# Patient Record
Sex: Female | Born: 1935 | Race: White | Hispanic: No | Smoking: Never smoker
Health system: Southern US, Community
[De-identification: ages and names within clinical notes are randomized; demographics above are authoritative.]

## PROBLEM LIST (undated history)

## (undated) DIAGNOSIS — E559 Vitamin D deficiency, unspecified: Secondary | ICD-10-CM

## (undated) DIAGNOSIS — E039 Hypothyroidism, unspecified: Secondary | ICD-10-CM

## (undated) DIAGNOSIS — F321 Major depressive disorder, single episode, moderate: Secondary | ICD-10-CM

## (undated) DIAGNOSIS — K754 Autoimmune hepatitis: Secondary | ICD-10-CM

## (undated) DIAGNOSIS — M810 Age-related osteoporosis without current pathological fracture: Secondary | ICD-10-CM

## (undated) DIAGNOSIS — J309 Allergic rhinitis, unspecified: Secondary | ICD-10-CM

## (undated) DIAGNOSIS — F411 Generalized anxiety disorder: Secondary | ICD-10-CM

## (undated) DIAGNOSIS — I10 Essential (primary) hypertension: Secondary | ICD-10-CM

## (undated) DIAGNOSIS — R51 Headache: Secondary | ICD-10-CM

## (undated) HISTORY — DX: Vitamin D deficiency, unspecified: E55.9

## (undated) HISTORY — DX: Allergic rhinitis, unspecified: J30.9

## (undated) HISTORY — DX: Essential (primary) hypertension: I10

## (undated) HISTORY — DX: Autoimmune hepatitis: K75.4

## (undated) HISTORY — DX: Headache: R51

## (undated) HISTORY — DX: Major depressive disorder, single episode, moderate: F32.1

## (undated) HISTORY — DX: Hypothyroidism, unspecified: E03.9

## (undated) HISTORY — DX: Age-related osteoporosis without current pathological fracture: M81.0

## (undated) HISTORY — DX: Generalized anxiety disorder: F41.1

---
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
## 2005-02-03 LAB — HM COLONOSCOPY

## 2006-02-03 HISTORY — PX: CATARACT EXTRACTION: SUR2

## 2006-02-03 LAB — CONVERTED CEMR LAB: Pap Smear: NORMAL

## 2007-02-02 ENCOUNTER — Encounter: Payer: Self-pay | Admitting: Family Medicine

## 2007-02-04 HISTORY — PX: CATARACT EXTRACTION: SUR2

## 2008-10-16 ENCOUNTER — Encounter: Payer: Self-pay | Admitting: Family Medicine

## 2009-02-12 ENCOUNTER — Encounter: Payer: Self-pay | Admitting: Family Medicine

## 2009-03-08 ENCOUNTER — Encounter: Payer: Self-pay | Admitting: Family Medicine

## 2009-04-26 ENCOUNTER — Ambulatory Visit: Payer: Self-pay | Admitting: Family Medicine

## 2009-04-26 DIAGNOSIS — E559 Vitamin D deficiency, unspecified: Secondary | ICD-10-CM | POA: Insufficient documentation

## 2009-04-26 DIAGNOSIS — J309 Allergic rhinitis, unspecified: Secondary | ICD-10-CM | POA: Insufficient documentation

## 2009-04-26 DIAGNOSIS — I10 Essential (primary) hypertension: Secondary | ICD-10-CM

## 2009-04-26 DIAGNOSIS — E039 Hypothyroidism, unspecified: Secondary | ICD-10-CM

## 2009-04-26 DIAGNOSIS — M810 Age-related osteoporosis without current pathological fracture: Secondary | ICD-10-CM | POA: Insufficient documentation

## 2009-05-03 ENCOUNTER — Encounter: Admission: RE | Admit: 2009-05-03 | Discharge: 2009-05-03 | Payer: Self-pay | Admitting: Family Medicine

## 2009-05-17 ENCOUNTER — Ambulatory Visit: Payer: Self-pay | Admitting: Family Medicine

## 2009-05-24 ENCOUNTER — Encounter: Payer: Self-pay | Admitting: Family Medicine

## 2009-05-25 ENCOUNTER — Encounter: Payer: Self-pay | Admitting: Family Medicine

## 2009-06-12 ENCOUNTER — Ambulatory Visit: Payer: Self-pay | Admitting: Family Medicine

## 2009-06-14 LAB — CONVERTED CEMR LAB
Albumin: 4 g/dL (ref 3.5–5.2)
BUN: 16 mg/dL (ref 6–23)
CO2: 29 meq/L (ref 19–32)
Calcium: 9.1 mg/dL (ref 8.4–10.5)
Chloride: 95 meq/L — ABNORMAL LOW (ref 96–112)
Creatinine, Ser: 0.7 mg/dL (ref 0.4–1.2)
GFR calc non Af Amer: 84.96 mL/min (ref >60)
Glucose, Bld: 92 mg/dL (ref 70–99)
Phosphorus: 3.7 mg/dL (ref 2.3–4.6)
Potassium: 4.4 meq/L (ref 3.5–5.1)
Sodium: 132 meq/L — ABNORMAL LOW (ref 135–145)

## 2009-07-09 ENCOUNTER — Telehealth: Payer: Self-pay | Admitting: Family Medicine

## 2009-07-11 ENCOUNTER — Telehealth: Payer: Self-pay | Admitting: Family Medicine

## 2009-09-04 ENCOUNTER — Encounter: Payer: Self-pay | Admitting: Family Medicine

## 2009-09-05 ENCOUNTER — Encounter: Payer: Self-pay | Admitting: Family Medicine

## 2009-10-02 ENCOUNTER — Encounter: Payer: Self-pay | Admitting: Family Medicine

## 2009-10-15 ENCOUNTER — Encounter: Payer: Self-pay | Admitting: Family Medicine

## 2009-10-19 ENCOUNTER — Ambulatory Visit: Payer: Self-pay | Admitting: Family Medicine

## 2009-10-19 DIAGNOSIS — F321 Major depressive disorder, single episode, moderate: Secondary | ICD-10-CM

## 2009-10-19 DIAGNOSIS — T887XXA Unspecified adverse effect of drug or medicament, initial encounter: Secondary | ICD-10-CM | POA: Insufficient documentation

## 2009-10-22 LAB — CONVERTED CEMR LAB
BUN: 14 mg/dL (ref 6–23)
CO2: 27 meq/L (ref 19–32)
Calcium: 9 mg/dL (ref 8.4–10.5)
Chloride: 97 meq/L (ref 96–112)
Creatinine, Ser: 0.73 mg/dL (ref 0.40–1.20)
Glucose, Bld: 90 mg/dL (ref 70–99)
Potassium: 4.2 meq/L (ref 3.5–5.3)
Sodium: 133 meq/L — ABNORMAL LOW (ref 135–145)

## 2009-10-23 ENCOUNTER — Telehealth: Payer: Self-pay | Admitting: Family Medicine

## 2009-10-25 ENCOUNTER — Telehealth: Payer: Self-pay | Admitting: Family Medicine

## 2009-11-06 ENCOUNTER — Telehealth: Payer: Self-pay | Admitting: Family Medicine

## 2009-11-10 ENCOUNTER — Encounter: Payer: Self-pay | Admitting: Family Medicine

## 2009-12-11 ENCOUNTER — Ambulatory Visit: Payer: Self-pay | Admitting: Family Medicine

## 2009-12-11 DIAGNOSIS — F411 Generalized anxiety disorder: Secondary | ICD-10-CM | POA: Insufficient documentation

## 2009-12-18 ENCOUNTER — Encounter: Payer: Self-pay | Admitting: Family Medicine

## 2009-12-19 ENCOUNTER — Encounter: Payer: Self-pay | Admitting: Family Medicine

## 2010-02-01 ENCOUNTER — Ambulatory Visit: Payer: Self-pay | Admitting: Internal Medicine

## 2010-02-02 DIAGNOSIS — R259 Unspecified abnormal involuntary movements: Secondary | ICD-10-CM | POA: Insufficient documentation

## 2010-02-15 ENCOUNTER — Ambulatory Visit: Admit: 2010-02-15 | Payer: Self-pay | Admitting: Licensed Clinical Social Worker

## 2010-02-15 ENCOUNTER — Ambulatory Visit
Admission: RE | Admit: 2010-02-15 | Discharge: 2010-02-15 | Payer: Self-pay | Source: Home / Self Care | Attending: Internal Medicine | Admitting: Internal Medicine

## 2010-02-15 DIAGNOSIS — R51 Headache: Secondary | ICD-10-CM

## 2010-02-15 DIAGNOSIS — R519 Headache, unspecified: Secondary | ICD-10-CM | POA: Insufficient documentation

## 2010-02-15 HISTORY — DX: Headache: R51

## 2010-02-18 ENCOUNTER — Ambulatory Visit
Admission: RE | Admit: 2010-02-18 | Discharge: 2010-02-18 | Payer: Self-pay | Source: Home / Self Care | Attending: Licensed Clinical Social Worker | Admitting: Licensed Clinical Social Worker

## 2010-02-20 ENCOUNTER — Telehealth: Payer: Self-pay | Admitting: Internal Medicine

## 2010-02-21 ENCOUNTER — Other Ambulatory Visit: Payer: Self-pay | Admitting: Internal Medicine

## 2010-02-21 ENCOUNTER — Ambulatory Visit
Admission: RE | Admit: 2010-02-21 | Discharge: 2010-02-21 | Payer: Self-pay | Source: Home / Self Care | Attending: Internal Medicine | Admitting: Internal Medicine

## 2010-02-21 LAB — BUN: BUN: 13 mg/dL (ref 6–23)

## 2010-02-21 LAB — CREATININE, SERUM: Creatinine, Ser: 0.6 mg/dL (ref 0.4–1.2)

## 2010-02-22 ENCOUNTER — Ambulatory Visit (HOSPITAL_COMMUNITY)
Admission: RE | Admit: 2010-02-22 | Discharge: 2010-02-22 | Payer: Self-pay | Source: Home / Self Care | Attending: Internal Medicine | Admitting: Internal Medicine

## 2010-02-25 ENCOUNTER — Ambulatory Visit
Admission: RE | Admit: 2010-02-25 | Discharge: 2010-02-25 | Payer: Self-pay | Source: Home / Self Care | Attending: Licensed Clinical Social Worker | Admitting: Licensed Clinical Social Worker

## 2010-03-05 NOTE — Letter (Signed)
Summary: Patient Questionnaire  Patient Questionnaire   Imported By: Beau Fanny 04/27/2009 09:00:07  _____________________________________________________________________  External Attachment:    Type:   Image     Comment:   External Document

## 2010-03-05 NOTE — Progress Notes (Signed)
Summary: wants phone call   Phone Note Call from Patient Call back at [phone]   Caller: Daughter- Okey Regal  Call For: Judith Part MD Summary of Call: Daughter is requesting that you call her at your convenience. She says that it is regarding her mother's phsychological  condition, that it is all she would tell me.  Initial call taken by: Melody Comas,  November 06, 2009 1:22 PM  Follow-up for Phone Call        would you leave her a message that it may not be until end of the day that I get a chance to call her back - - and send this back to me  Follow-up by: Judith Part MD,  November 06, 2009 2:40 PM  Additional Follow-up for Phone Call Additional follow up Details #1::        [place] notified as instructed by telephone. Okey Regal said that would be fine.Lewanda Rife LPN  November 06, 2009 2:51 PM    Additional Follow-up for Phone Call Additional follow up Details #2::    I spoke to Okey Regal -- who I have permission to talk with (her daughter) Dr Donell Beers took Pattricia Boss off zoloft for rash and that is better thankfully she is on ativan for sleep/ anxiety- which is helping still very depressed however  Okey Regal left message with Dr Caprice Renshaw office on sept 26th and again yesterday and has not heard back with a plan this concerns her greatly  she is also worried about liver health (with her hx of auto immune hepatitis ) and is interested in B12 and folate levels with hx of fatigue and limited/ vegetarian diet   please print this phone note and fax to Dr Caprice Renshaw office (attn to his nurse) please schedule labs for hepatic panel and also B12 and folate for 995.2 adn 780.79 and call Okey Regal about that tomorrow -- thanks  Follow-up by: Judith Part MD,  November 06, 2009 4:41 PM  Additional Follow-up for Phone Call Additional follow up Details #3:: Details for Additional Follow-up Action Taken: Spoke with pt's daughter Okey Regal. Pt does not want to have B12 and folate done now and pt already has appt  in Nov.2011 with digestive specialist to have liver panel done. For now pt does not want to schedule any labs. If there is a change Okey Regal will let Dr Milinda Antis know. Copy of this note faxed to the attention of Dr. Caprice Renshaw nurse [phone] as instructed.Lewanda Rife LPN  November 07, 2009 11:31 AM

## 2010-03-05 NOTE — Consult Note (Signed)
Summary: [facility]  [facility]   Imported By: Lanelle Bal 05/30/2009 10:23:50  _____________________________________________________________________  External Attachment:    Type:   Image     Comment:   External Document

## 2010-03-05 NOTE — Letter (Signed)
Summary: Letter to Patient Regarding Labs/[facility]  Letter to Patient Regarding Labs/[facility]   Imported By: Lanelle Bal 06/05/2009 12:24:35  _____________________________________________________________________  External Attachment:    Type:   Image     Comment:   External Document

## 2010-03-05 NOTE — Assessment & Plan Note (Signed)
Summary: 3-4 WEEK FOLLOW UP BLOOK PRESSURE CHECK/[facility]   Vital Signs:  Patient profile:   77 year old female Height:      66 inches Weight:      115.25 pounds BMI:     18.67 Temp:     98.4 degrees F oral Pulse rate:   60 / minute Pulse rhythm:   regular BP sitting:   138 / 66  (left arm) Cuff size:   regular  Vitals Entered By: Lewanda Rife LPN (Jun 12, 2009 12:46 PM)  Serial Vital Signs/Assessments:  Time      Position  BP       Pulse  Resp  Temp     By                     130/70                         Judith Part MD  CC: 3-4 week follow up on BP   History of Present Illness: here for f/u of HTN -- bp is 138/66- much imp now on ace and hctz 25    so far no side effects or problems   used a different kind of cream last night -- and now arms are red -- brand Barbara Cower new package -- thinks she is allergic to it    not outdoors a lot  has rosacea and sensitive skin     Allergies: 1)  ! Amlodipine Besylate (Amlodipine Besylate) 2)  ! Penicillin 3)  ! Sulfa 4)  ! Azathioprine (Azathioprine)  Past History:  Past Medical History: Last updated: 05-23-09 Autoimmune hepatitis 2008 Jaundice 2008 hypothyroid  HTN  OP  allergic rhinitis  bunions       Dr Rosine Beat --GI in [place]  Past Surgical History: Last updated: 05-23-09 Cataract extraction left eye 2008 Cataract extraction right eye 2009 Tonsillectomy 1945 colonosc 2007 normal -- made her sick and does not ever want another one   Family History: Last updated: 05-23-2009 Father: deceased heart disease Mother: deceased stroke and high BP Siblings: sister age 85 stroke deceased  Social History: Last updated: 23-May-2009 Retired Married Never Smoked Alcohol use-no Drug use-no daughter is Vung Kush- Fifield   Risk Factors: Smoking Status: never (05/23/2009)  Review of Systems General:  Denies fatigue, loss of appetite, and malaise. Eyes:  Denies blurring and eye irritation. CV:  Denies chest  pain or discomfort, fatigue, lightheadness, palpitations, shortness of breath with exertion, and swelling of feet. Resp:  Denies cough and wheezing. GI:  Denies abdominal pain and change in bowel habits. GU:  Denies abnormal vaginal bleeding. MS:  Denies joint redness, joint swelling, and muscle aches. Derm:  Denies itching, lesion(s), poor wound healing, and rash. Neuro:  Denies headaches, numbness, and tingling. Endo:  Denies cold intolerance and heat intolerance. Heme:  Denies abnormal bruising and bleeding.  Physical Exam  General:  Well-developed,well-nourished,in no acute distress; alert,appropriate and cooperative throughout examination Head:  normocephalic, atraumatic, and no abnormalities observed.   Eyes:  vision grossly intact, pupils equal, pupils round, and pupils reactive to light.   Mouth:  pharynx pink and moist.   Neck:  supple with full rom and no masses or thyromegally, no JVD or carotid bruit  Lungs:  Normal respiratory effort, chest expands symmetrically. Lungs are clear to auscultation, no crackles or wheezes. Heart:  Normal rate and regular rhythm. S1 and S2 normal without gallop, murmur, click, rub or  other extra sounds. Extremities:  No clubbing, cyanosis, edema, or deformity noted with normal full range of motion of all joints.   Neurologic:  gait normal and DTRs symmetrical and normal.  no tremor  Skin:  mild redness- hands to wrists only  this is where she used new moisturizer recently Cervical Nodes:  No lymphadenopathy noted Psych:  nl affect today   Impression & Recommendations:  Problem # 1:  HYPERTENSION, BENIGN ESSENTIAL (ICD-401.1) Assessment Improved  bp is much imp with current ace and hct  will continue these and good lifestyle habits lab today  update if side eff f/u in 6 mo  Her updated medication list for this problem includes:    Lisinopril 40 Mg Tabs (Lisinopril) .[name] Kitchen... 1 by mouth once daily    Hydrochlorothiazide 25 Mg Tabs  (Hydrochlorothiazide) .[name] Kitchen... 1 by mouth once daily  Orders: Venipuncture (16109) TLB-Renal Function Panel (80069-RENAL)  BP today: 138/66-- re check 130/70 Prior BP: 160/80 (05/17/2009)  Complete Medication List: 1)  Levothroid 50 Mcg Tabs (Levothyroxine sodium) .... Take one tablet daily 2)  Lisinopril 40 Mg Tabs (Lisinopril) .[name] Kitchen.. 1 by mouth once daily 3)  Mercaptopurine 50 Mg Tabs (Mercaptopurine) .... Take 1/2 tablet daily 4)  Alendronate Sodium 70 Mg Tabs (Alendronate sodium) .... Take one capsule once a week 5)  Calcium 500 Mg Tabs (Calcium) .... Take three tablets by mouth daily 6)  Hydrochlorothiazide 25 Mg Tabs (Hydrochlorothiazide) .[name] Kitchen.. 1 by mouth once daily 7)  Vitamin D 2000 Unit Tabs (Cholecalciferol) .... Take 1 tablet by mouth once a day  Patient Instructions: 1)  labs today 2)  continue current medicines  3)  your blood pressure is much better today 4)  keep taking good care of herself  5)  follow up with me in about 6 months   Current Allergies (reviewed today): ! AMLODIPINE BESYLATE (AMLODIPINE BESYLATE) ! PENICILLIN ! SULFA ! AZATHIOPRINE (AZATHIOPRINE)

## 2010-03-05 NOTE — Assessment & Plan Note (Signed)
Summary: ROA FOR 6 MONTH FOLLOW-UP/JRR   Vital Signs:  Patient profile:   77 year old female Height:      66 inches Weight:      111.50 pounds BMI:     18.06 Temp:     98 degrees F oral Pulse rate:   60 / minute Pulse rhythm:   regular BP sitting:   158 / 68  (left arm) Cuff size:   regular  Vitals Entered By: Lewanda Rife LPN (December 11, 2009 10:39 AM)  Serial Vital Signs/Assessments:  Time      Position  BP       Pulse  Resp  Temp     By                     142/70                         Judith Part MD  Comments: at rest By: Judith Part MD   CC: six month f/u   History of Present Illness: here for f/u of hypothyroidism and HTN and OP   is on thyroid supplement   bp high on first check today 158/68   given autoimmune hepatitis and fatigue- was interested in B12 /folate check a while back  seeing psychiatry-- Dr Donell Beers  on mirtazapine/ lorazapam/ temazapam for dep and anx and insomnia  is sleeping better now  her daughter is not very happy with their care- sent px to the wrong pharmacy  px were not faxed -- and had to go over there  wants to work on new psychiatrist ref   her L shoulder is hurting -- sore ? from position in bed  a few weeks -- now is hurting up higher   had episode on one saturday -- got dizzy and felt weird  went to [facility] -- her systolic was 199 / ?  went to urgent care (saw Dr Merla Riches) -- he px another bp med  added atenolol and is doing ok on that   are going to check it again   on fosamax for OP - no problems with that   wt is stable  Allergies: 1)  ! Amlodipine Besylate (Amlodipine Besylate) 2)  ! Penicillin 3)  ! Sulfa 4)  ! Azathioprine (Azathioprine) 5)  ! * Hctz 6)  ! Zoloft  Past History:  Past Medical History: Last updated: 10/19/2009 Autoimmune hepatitis 2008 Jaundice 2008 hypothyroid  HTN  OP  allergic rhinitis  bunions  depression/ anxiety major      Dr Rosine Beat --GI in [place]  Past Surgical  History: Last updated: 05/11/2009 Cataract extraction left eye 2008 Cataract extraction right eye 2009 Tonsillectomy 1945 colonosc 2007 normal -- made her sick and does not ever want another one   Family History: Last updated: 05-11-09 Father: deceased heart disease Mother: deceased stroke and high BP Siblings: sister age 55 stroke deceased  Social History: Last updated: May 11, 2009 Retired Married Never Smoked Alcohol use-no Drug use-no daughter is Jonette Wassel- Fifield   Risk Factors: Smoking Status: never (May 11, 2009)  Review of Systems General:  Complains of fatigue and loss of appetite; denies chills and fever. Eyes:  Denies blurring and eye irritation. CV:  Denies chest pain or discomfort, lightheadness, palpitations, and shortness of breath with exertion. Resp:  Denies cough and shortness of breath. GI:  Denies change in bowel habits, nausea, and vomiting. GU:  Denies urinary frequency. MS:  Complains of stiffness; denies cramps and muscle weakness. Derm:  Denies itching, lesion(s), poor wound healing, and rash. Neuro:  Denies numbness, tingling, and weakness. Psych:  Complains of anxiety, depression, and irritability; denies easily tearful, panic attacks, sense of great danger, and suicidal thoughts/plans. Endo:  Denies cold intolerance, excessive thirst, excessive urination, and heat intolerance. Heme:  Denies abnormal bruising and bleeding.  Physical Exam  General:  slim fatigued appearing elderly female Head:  normocephalic, atraumatic, and no abnormalities observed.   Eyes:  vision grossly intact, pupils equal, pupils round, and pupils reactive to light.  no conjunctival pallor, injection or icterus  Mouth:  pharynx pink and moist.   Neck:  supple with full rom and no masses or thyromegally, no JVD or carotid bruit  Chest Wall:  No deformities, masses, or tenderness noted. Lungs:  Normal respiratory effort, chest expands symmetrically. Lungs are clear to  auscultation, no crackles or wheezes. Heart:  Normal rate and regular rhythm. S1 and S2 normal without gallop, murmur, click, rub or other extra sounds. Abdomen:  soft, non-tender, and normal bowel sounds.   Msk:  No deformity or scoliosis noted of thoracic or lumbar spine.  no acute joint changes  kyphosis noted  some tenderness of L pericervical musculature  worse with full rot of head  no bony tenderness Pulses:  R and L carotid,radial,femoral,dorsalis pedis and posterior tibial pulses are full and equal bilaterally Extremities:  No clubbing, cyanosis, edema, or deformity noted with normal full range of motion of all joints.   Neurologic:  sensation intact to light touch, gait normal, and DTRs symmetrical and normal.  no tremor  Skin:  Intact without suspicious lesions or rashes Cervical Nodes:  No lymphadenopathy noted Psych:  depressed affect complains of physical symptoms, treatment at psychiatrist and long journey to get to this office her daughter is helpful with obt history   Impression & Recommendations:  Problem # 1:  DEPRESSION, MAJOR, MODERATE (ICD-296.22) Assessment Improved overall making some slow imp on mirtazapine  pt interested in different psych practice due to disorganization at current one  disc ref to new practice recommend continuing plan from Dr Donell Beers at this time until new practice is found  Orders: Psychiatric Referral (Psych)  Problem # 2:  HYPOTHYROIDISM (ICD-244.9) Assessment: Unchanged will send for UC labs to see how tsh was and then adv no clinical change except fatigue and depression Her updated medication list for this problem includes:    Levothroid 50 Mcg Tabs (Levothyroxine sodium) .[name] Kitchen... Take one tablet daily  Problem # 3:  HYPERTENSION, BENIGN ESSENTIAL (ICD-401.1) Assessment: Improved  bp is better on 2nd check today will continue current meds with atenolol and monitor at home  recommended omron cuff for home to keep eye on it  will  rev labs from urgent care  f/u 6 mo Her updated medication list for this problem includes:    Lisinopril 40 Mg Tabs (Lisinopril) .[name] Kitchen... 1 by mouth once daily    Atenolol 50 Mg Tabs (Atenolol) .[name] Kitchen... Take 1 tablet by mouth once a day  BP today: 158/68 Prior BP: 164/82 (10/19/2009)  Labs Reviewed: K+: 4.2 (10/19/2009) Creat: : 0.73 (10/19/2009)     Orders: Prescription Created Electronically [phone])  Problem # 4:  UNSPECIFIED VITAMIN D DEFICIENCY (ICD-268.9) Assessment: Unchanged this should be checked if not done at her uc visit  will review and then adv  is taking her vit D  Problem # 5:  * HEPATITIS/JAUNDICE Assessment: Comment Only hx of autoimmune hepatitis --  daughter is interested in checking her B12 and folate  diet not optimal lately will make recomm after rev reent labs at urgent care   Problem # 6:  NECK PAIN (ICD-723.1) Assessment: New soreness in L cervical muscuature - may be due to sleep position recommend trial of cervical support pillow / heat and update  Complete Medication List: 1)  Levothroid 50 Mcg Tabs (Levothyroxine sodium) .... Take one tablet daily 2)  Lisinopril 40 Mg Tabs (Lisinopril) .[name] Kitchen.. 1 by mouth once daily 3)  Mercaptopurine 50 Mg Tabs (Mercaptopurine) .... Take 1/2 tablet daily 4)  Alendronate Sodium 70 Mg Tabs (Alendronate sodium) .... Take one capsule once a week 5)  Calcium 500 Mg Tabs (Calcium) .... Take three tablets by mouth daily 6)  Vitamin D 2000 Unit Tabs (Cholecalciferol) .... Take 1 tablet by mouth once a day 7)  Temazepam 15 Mg Caps (Temazepam) .[name] Kitchen.. 1 by mouth at bedtime may repeat as needed 8)  Lorazepam 0.5 Mg Tabs (Lorazepam) .... Take 1 tablet by mouth once a day as needed. 9)  Mirtazapine 45 Mg Tabs (Mirtazapine) .... Take 1 tablet by mouth once a day at bedtime 10)  Atenolol 50 Mg Tabs (Atenolol) .... Take 1 tablet by mouth once a day 11)  Omron Blood Pressure Cuff  .... To check bp once daily and as needed for htn  Patient  Instructions: 1)  please send for labs from urgent care Dr Merla Riches  2)  after I review these I will call to let you know what labs we need to do to catch up  3)  try a foam cervical support pillow to keep your neck in better alignment 4)  I recommend OMRON bp cuff size regular for arm  5)  check blood presure at home when relaxed 6)  we will do a new psychiatry referral at check out  7)  follow up with me in about 6 months  Prescriptions: OMRON BLOOD PRESSURE CUFF to check bp once daily and as needed for HTN  #1 x 0   Entered and Authorized by:   Judith Part MD   Signed by:   Judith Part MD on 12/11/2009   Method used:   Print then Give to Patient   RxID:   [id number] ATENOLOL 50 MG TABS (ATENOLOL) Take 1 tablet by mouth once a day  #30 x 11   Entered and Authorized by:   Judith Part MD   Signed by:   Judith Part MD on 12/11/2009   Method used:   Electronically to        [facility] # [phone]* (retail)       [address]       [place], [place]  [address]       Ph: [phone]       Fax: [phone]   RxID:   [id number]    Orders Added: 1)  Psychiatric Referral [Psych] 2)  Prescription Created Electronically [G8553] 3)  Est. Patient Level IV [56433]    Current Allergies (reviewed today): ! AMLODIPINE BESYLATE (AMLODIPINE BESYLATE) ! PENICILLIN ! SULFA ! AZATHIOPRINE (AZATHIOPRINE) ! * HCTZ ! ZOLOFT

## 2010-03-05 NOTE — Assessment & Plan Note (Signed)
Summary: PT IS DEPRESSED   Vital Signs:  Patient profile:   77 year old female Height:      66 inches Weight:      112.25 pounds BMI:     18.18 Temp:     98.2 degrees F oral Pulse rate:   60 / minute Pulse rhythm:   regular BP sitting:   164 / 82  (left arm) Cuff size:   regular  Vitals Entered By: Lewanda Rife LPN (October 19, 2009 2:18 PM) CC: Pt is depressed   History of Present Illness: has been diagnosed with depression   on august 30th - saw Thora Lance - clinical psychologist  he sent them Dr Merla Riches on the same day -- walk in clinic primary care  did some blood tests for thyroid - was ok  given px for 25 mg of zoloft and a sleeping aid also (? name of med)   went for 2 weeks and no better or worse   sept 9th  went with daughter Calrol to Dr Pilar Jarvis again  by the 12 th was feeling very anx and not imp at all /frankly worse  early am went to Dr Theotis Barrio - he sent her to [facility] -- Samson Frederic (? director)  said she was in the wrong place and sent to [facility]-- saw Dr Donell Beers changed dosage to 50 mg of zoloft for 6 days and then it will go up to 100 mg  also changed sleep aid to reservatol   will see him again on nov 3rd   she is feeling worse and not  feels like - she does not have any feeling anymore  can't and does not do anything anymore -- just terrible -- cannot motivate herself  cannot even cry  feels sleepy sometimes -- may be from lack of sleep  sleep med helped at first -- and now back to every few hours   has a hx of depression in the family-- and she has a lot of anxiety about that  anx may be making bp go up  mother committed suicide as older person , and sister has manic depression    Allergies: 1)  ! Amlodipine Besylate (Amlodipine Besylate) 2)  ! Penicillin 3)  ! Sulfa 4)  ! Azathioprine (Azathioprine) 5)  ! * Hctz  Past History:  Past Surgical History: Last updated: May 07, 2009 Cataract extraction left eye  2008 Cataract extraction right eye 2009 Tonsillectomy 1945 colonosc 2007 normal -- made her sick and does not ever want another one   Family History: Last updated: 2009/05/07 Father: deceased heart disease Mother: deceased stroke and high BP Siblings: sister age 7 stroke deceased  Social History: Last updated: 2009/05/07 Retired Married Never Smoked Alcohol use-no Drug use-no daughter is Yasamin Karel- Fifield   Risk Factors: Smoking Status: never (05-07-09)  Past Medical History: Autoimmune hepatitis 2008 Jaundice 2008 hypothyroid  HTN  OP  allergic rhinitis  bunions  depression/ anxiety major      Dr Rosine Beat --GI in [place]  Review of Systems General:  Complains of fatigue, loss of appetite, sleep disorder, and weight loss; denies weakness. Eyes:  Denies blurring and eye irritation. CV:  Denies chest pain or discomfort, lightheadness, and palpitations. Resp:  Denies cough, shortness of breath, and wheezing. GI:  Complains of constipation; denies abdominal pain. GU:  Denies urinary frequency. MS:  Denies muscle aches and cramps. Derm:  Denies itching, lesion(s), poor wound healing, and rash. Neuro:  Denies headaches, numbness,  and tingling. Psych:  Complains of panic attacks; denies sense of great danger and suicidal thoughts/plans. Endo:  Denies cold intolerance, excessive thirst, excessive urination, and heat intolerance. Heme:  Denies abnormal bruising and bleeding.  Physical Exam  General:  slim fatigued appearing elderly female Head:  normocephalic, atraumatic, and no abnormalities observed.   Eyes:  vision grossly intact, pupils equal, pupils round, and pupils reactive to light.  no conjunctival pallor, injection or icterus  Neck:  supple with full rom and no masses or thyromegally, no JVD or carotid bruit  Lungs:  Normal respiratory effort, chest expands symmetrically. Lungs are clear to auscultation, no crackles or wheezes. Heart:  Normal rate and regular  rhythm. S1 and S2 normal without gallop, murmur, click, rub or other extra sounds. Msk:  No deformity or scoliosis noted of thoracic or lumbar spine.  no acute joint changes  Pulses:  R and L carotid,radial,femoral,dorsalis pedis and posterior tibial pulses are full and equal bilaterally Extremities:  No clubbing, cyanosis, edema, or deformity noted with normal full range of motion of all joints.   Neurologic:  sensation intact to light touch, gait normal, and DTRs symmetrical and normal.  no tremor  Skin:  Intact without suspicious lesions or rashes Cervical Nodes:  No lymphadenopathy noted Psych:  candidly disc symptoms poor eye contact - seems very sad and down   Impression & Recommendations:  Problem # 1:  DEPRESSION, MAJOR, MODERATE (ICD-296.22) Assessment New without suicidal ideation spent 25 minutes face to face time with pt , over 50% of which was spent on counseling and coordination of care   see inst for recommendations disc stressors / symtpoms/ support sources/ plan for care/ opt for care and poss side eff  check sodium level in light of ssri (has been low in the past) adv to continue f/u with psychiatrist /counselor and check in today  Complete Medication List: 1)  Levothroid 50 Mcg Tabs (Levothyroxine sodium) .... Take one tablet daily 2)  Lisinopril 40 Mg Tabs (Lisinopril) .[name] Kitchen.. 1 by mouth once daily 3)  Mercaptopurine 50 Mg Tabs (Mercaptopurine) .... Take 1/2 tablet daily 4)  Alendronate Sodium 70 Mg Tabs (Alendronate sodium) .... Take one capsule once a week 5)  Calcium 500 Mg Tabs (Calcium) .... Take three tablets by mouth daily 6)  Vitamin D 2000 Unit Tabs (Cholecalciferol) .... Take 1 tablet by mouth once a day 7)  Sertraline Hcl 100 Mg Tabs (Sertraline hcl) .... Take 1/2 tablet by mouth every morning for 6 days and then one tablet by mout every morning. 8)  Temazepam 15 Mg Caps (Temazepam) .[name] Kitchen.. 1-2 by mouth at bedtime as needed  Other Orders: Venipuncture  (16109) T-Basic Metabolic Panel (60454-09811) Flu Vaccine 35yrs + MEDICARE PATIENTS (B1478) Administration Flu vaccine - MCR (G0008) Specimen Handling (29562)  Patient Instructions: 1)  please try to keep up with regular meals and fluids  2)  if any suicidal thoughts or worries - call your psychiatrist or go to [facility] for urgent evaluation  3)  continue zoloft 4)  you can take 2 of the temazepam at bedtime 5)  keep monitoring and helping with activities of daily living  6)  call Dr [facility] office or monday- to update  7)  we will check sodium level today  Current Allergies (reviewed today): ! AMLODIPINE BESYLATE (AMLODIPINE BESYLATE) ! PENICILLIN ! SULFA ! AZATHIOPRINE (AZATHIOPRINE) ! * HCTZ      Flu Vaccine Consent Questions     Do you have a history  of severe allergic reactions to this vaccine? no    Any prior history of allergic reactions to egg and/or gelatin? no    Do you have a sensitivity to the preservative Thimersol? no    Do you have a past history of Guillan-Barre Syndrome? no    Do you currently have an acute febrile illness? no    Have you ever had a severe reaction to latex? no    Vaccine information given and explained to patient? yes    Are you currently pregnant? no    Lot Number:[id number]   Exp Date:08/03/2010   Site Given  Left Deltoid IMflu Lewanda Rife LPN  October 19, 2009 3:35 PM   Appended Document: PT IS DEPRESSED please send copy of this note to her psychiatrist Dr Donell Beers  Appended Document: PT IS DEPRESSED Copy of this note faxed thru EMR to Dr Donell Beers as instructed.

## 2010-03-05 NOTE — Progress Notes (Signed)
Summary: itchy rash  Phone Note Call from Patient Call back at Home Phone [phone]   Caller: Patient Call For: Judith Part MD Summary of Call: Pt states she has itchy rash on arms and neck since the 2nd week of may.  She thinks this is from the hctz that she was started on in april.  Should she stop this, should she be seen? You dont have any appts available today and she only wants to see you. Initial call taken by: Lowella Petties CMA,  July 09, 2009 8:24 AM  Follow-up for Phone Call        stop the hct  I am out this afternoon to wed call on wed am please to update me with how rash is -- and use caution in the sun  then will decide what to do next Follow-up by: Judith Part MD,  July 09, 2009 8:25 AM  Additional Follow-up for Phone Call Additional follow up Details #1::        Pt called, she wanted to know if Dr. Milinda Antis had returned her back...., I gave the intructions per Dr. Milinda Antis.[name] KitchenMarland KitchenNolan Lasser says she will follow on Wed. w/ Dr. Milinda Antis.Daine Gip  July 09, 2009 11:09 AM  Additional Follow-up by: Daine Gip,  July 09, 2009 11:10 AM

## 2010-03-05 NOTE — Progress Notes (Signed)
Summary: update on rash and itching  Phone Note Call from Patient Call back at Home Phone [phone]   Caller: Patient Call For: Judith Part MD Summary of Call: Pt was told to call back today with report on how her rash is doing since she stopped the hctz.  Pt said she has not been itching since last night.  Skin has leathery look like it has been peeling. Arms from wrist to elbows and neck  are peeling. Pt said rash looks like it is gone. Has not taken med since 07/09/09.Please advise.   Initial call taken by: Lewanda Rife LPN,  July 11, 7844 4:08 PM  Follow-up for Phone Call        stay off the hctz and let me know when arms look back to normal and no longer peeling or leathery -- I do not want to start any new meds until she is totally back to normal   Follow-up by: Judith Part MD,  July 11, 2009 5:22 PM  Additional Follow-up for Phone Call Additional follow up Details #1::        Patient notified as instructed by telephone. Lewanda Rife LPN  July 12, 9627 5:32 PM    New Allergies: ! * HCTZ New Allergies: ! * HCTZ

## 2010-03-05 NOTE — Letter (Signed)
Summary: Lab Test Results [facility]   Lab Test Results Letter/[facility]   Imported By: Maryln Gottron 12/28/2009 09:00:13  _____________________________________________________________________  External Attachment:    Type:   Image     Comment:   External Document

## 2010-03-05 NOTE — Letter (Signed)
Summary: Letter to Patient Regarding Labs/[facility]  Letter to Patient Regarding Labs/[facility]   Imported By: Lanelle Bal 05/01/2009 13:53:41  _____________________________________________________________________  External Attachment:    Type:   Image     Comment:   External Document

## 2010-03-05 NOTE — Letter (Signed)
Summary: [facility]  [facility]   Imported By: Maryln Gottron 11/21/2009 15:12:22  _____________________________________________________________________  External Attachment:    Type:   Image     Comment:   External Document

## 2010-03-05 NOTE — Assessment & Plan Note (Signed)
Summary: NEW PT TO EST PER DR Tabitha Tupper/[facility]   Vital Signs:  Patient profile:   77 year old female Height:      66 inches Weight:      114.25 pounds BMI:     18.51 Temp:     98.2 degrees F oral Pulse rate:   76 / minute Pulse rhythm:   regular BP sitting:   186 / 80  (right arm) Cuff size:   regular  Vitals Entered By: Lewanda Rife LPN (05/02/2009 11:01 AM) CC: new patient to establish   History of Present Illness: is here to get est for care   Dr Merian Capron was her prev physician   bp is high  tried amlodipine and she was all to it  is on lisinopril for a long time   has a hx of autoimmune hepatitis -- has check up next mo liver labs were nl in the fall  is off prednisone now   she does not get tetnus shots - declines them   had pneumonia shot -- and then she had some shoulder pain 2 weeks later   has osteoporosis -- had to be on prednisone in past for the auto immune hepatitis  had bone density - last one was recent -- and it had worsened some  had been on fosamax and calcium  has not followed up for that yet  takes 800 iuvit D -- an level is 27  is taking 1500 mg daily of calcium   used to walk for exercise  is on her feet all the time  does a lot of stretches   occ has some very slt vaginal spotting    last labs 9/10     Preventive Screening-Counseling & Management  Alcohol-Tobacco     Smoking Status: never      Drug Use:  no.    Allergies (verified): 1)  ! Amlodipine Besylate (Amlodipine Besylate) 2)  ! Penicillin 3)  ! Sulfa 4)  ! Azathioprine (Azathioprine)  Past History:  Family History: Last updated: 02-May-2009 Father: deceased heart disease Mother: deceased stroke and high BP Siblings: sister age 71 stroke deceased  Social History: Last updated: 05/02/2009 Retired Married Never Smoked Alcohol use-no Drug use-no daughter is Kenzleigh Sedam- Fifield   Risk Factors: Smoking Status: never (2009/05/02)  Past Medical  History: Autoimmune hepatitis 2008 Jaundice 2008 hypothyroid  HTN  OP  allergic rhinitis  bunions       Dr Rosine Beat --GI in [place]  Past Surgical History: Cataract extraction left eye 2008 Cataract extraction right eye 2009 Tonsillectomy 1945 colonosc 2007 normal -- made her sick and does not ever want another one   Family History: Father: deceased heart disease Mother: deceased stroke and high BP Siblings: sister age 45 stroke deceased  Social History: Retired Married Never Smoked Alcohol use-no Drug use-no daughter is Mavery Milling- Fifield  Smoking Status:  never Drug Use:  no  Review of Systems General:  Denies fatigue, fever, loss of appetite, and malaise. Eyes:  Denies blurring and eye pain. CV:  Denies chest pain or discomfort, lightheadness, palpitations, and shortness of breath with exertion. Resp:  Denies cough, shortness of breath, and wheezing. GI:  Denies abdominal pain, bloody stools, change in bowel habits, and indigestion. GU:  Denies dysuria and urinary frequency. MS:  Denies joint pain, joint redness, and joint swelling. Derm:  Denies itching, lesion(s), poor wound healing, and rash. Neuro:  Denies numbness, tingling, and weakness. Psych:  Denies anxiety and depression.  Endo:  Denies cold intolerance, excessive thirst, excessive urination, and heat intolerance. Heme:  Denies abnormal bruising and bleeding.  Physical Exam  General:  slim and well app elderly femaile Head:  normocephalic, atraumatic, and no abnormalities observed.   Eyes:  vision grossly intact, pupils equal, pupils round, and pupils reactive to light.  no conjunctival pallor, injection or icterus  Ears:  R ear normal and L ear normal.   Nose:  no nasal discharge.   Mouth:  pharynx pink and moist.   Neck:  supple with full rom and no masses or thyromegally, no JVD or carotid bruit  Chest Wall:  No deformities, masses, or tenderness noted. Lungs:  Normal respiratory effort, chest  expands symmetrically. Lungs are clear to auscultation, no crackles or wheezes. Heart:  Normal rate and regular rhythm. S1 and S2 normal without gallop, murmur, click, rub or other extra sounds. Abdomen:  Bowel sounds positive,abdomen soft and non-tender without masses, organomegaly or hernias noted. no renal bruits  Msk:  No deformity or scoliosis noted of thoracic or lumbar spine.  no acute joint changes  Pulses:  R and L carotid,radial,femoral,dorsalis pedis and posterior tibial pulses are full and equal bilaterally Extremities:  No clubbing, cyanosis, edema, or deformity noted with normal full range of motion of all joints.   Neurologic:  sensation intact to light touch, gait normal, and DTRs symmetrical and normal.  no tremor  Skin:  Intact without suspicious lesions or rashes fair with lentigos diffusely Cervical Nodes:  No lymphadenopathy noted Inguinal Nodes:  No significant adenopathy Psych:  nl affect is somewhat serious and stoic    Impression & Recommendations:  Problem # 1:  POSTMENOPAUSAL BLEEDING (ICD-627.1) Assessment New this is scant - but still abn get pelvic US and then make plan- may need endo bx  Orders: Radiology Referral (Radiology)  Problem # 2:  HYPERTENSION, BENIGN ESSENTIAL (ICD-401.1) Assessment: New bp high and not well conrolled - though no symptoms overall good lifestyle habits  inc lisinopril to 40-- update if side eff then follow up  Her updated medication list for this problem includes:    Lisinopril 40 Mg Tabs (Lisinopril) .[name] Kitchen... 1 by mouth once daily  Problem # 3:  HYPOTHYROIDISM (ICD-244.9) Assessment: New overall stable clinically and stable theraputic tsh from the fall  continue same dose  Her updated medication list for this problem includes:    Levothroid 50 Mcg Tabs (Levothyroxine sodium) .[name] Kitchen... Take one tablet daily  Problem # 4:  UNSPECIFIED VITAMIN D DEFICIENCY (ICD-268.9) Assessment: New vit D level is low and disc imp of this to  bone and overall health  adv to inc vit D to 2000 international units per day also disc ca intake  Problem # 5:  OSTEOPOROSIS (ICD-733.00) Assessment: New sounds like this may have worsened on last dexa  poss due to the steriods she took temporarliy - off now also disc other risk factors- age/ post men and also hypothyroid  rev ca and D rec continue fosamax rev dexa at next visit  if this continues to worsen - disc pos of forteo in future also disc safety issure  Her updated medication list for this problem includes:    Vitamin D 400 Unit Tabs (Cholecalciferol) .[name] Kitchen... Take two daily    Alendronate Sodium 70 Mg Tabs (Alendronate sodium) .[name] Kitchen... Take one capsule once a week  Complete Medication List: 1)  Vitamin D 400 Unit Tabs (Cholecalciferol) .... Take two daily 2)  Levothroid 50 Mcg Tabs (Levothyroxine sodium) .[name] KitchenMarland KitchenMarland Kitchen  Take one tablet daily 3)  Lisinopril 40 Mg Tabs (Lisinopril) .[name] Kitchen.. 1 by mouth once daily 4)  Mercaptopurine 50 Mg Tabs (Mercaptopurine) .... Take 1/2 tablet daily 5)  Alendronate Sodium 70 Mg Tabs (Alendronate sodium) .... Take one capsule once a week  Patient Instructions: 1)  please send for copy of last dexa report - at [facility]  2)  increase your lisinopril to 40 mg once daily -- if you have any side effects - let me know  3)  keep up walking and stay active  4)  avoid a lot of salt in diet  5)  increase your vitamin D to 2000 international units daily  6)  continue other medicines without change  7)  follow up in 2-4 weeks to re check blood pressure  8)  we will set up pelvic ultrasound at check out Prescriptions: LISINOPRIL 40 MG TABS (LISINOPRIL) 1 by mouth once daily  #30 x 11   Entered and Authorized by:   Judith Part MD   Signed by:   Judith Part MD on 04/26/2009   Method used:   Electronically to        [facility] # [phone]* (retail)       [address]       [place], [place]  [address]       Ph: [phone]       Fax: [phone]    RxID:   [id number] LEVOTHROID 50 MCG TABS (LEVOTHYROXINE SODIUM) take one tablet daily  #30 x 11   Entered and Authorized by:   Judith Part MD   Signed by:   Judith Part MD on 04/26/2009   Method used:   Electronically to        [facility] [address] # [phone]* (retail)       [address]       [place], [place]  [address]       Ph: [phone]       Fax: [phone]   RxID:   [id number] ALENDRONATE SODIUM 70 MG TABS (ALENDRONATE SODIUM) take one capsule once a week  #4 x 11   Entered and Authorized by:   Judith Part MD   Signed by:   Judith Part MD on 04/26/2009   Method used:   Electronically to        [facility] [address] # [phone]* (retail)       [address]       [place], [place]  [address]       Ph: [phone]       Fax: [phone]   RxID:   [id number]   Current Allergies (reviewed today): ! AMLODIPINE BESYLATE (AMLODIPINE BESYLATE) ! PENICILLIN ! SULFA ! AZATHIOPRINE (AZATHIOPRINE)   Immunization History:  Pneumovax Immunization History:    Pneumovax:  pneumovax (02/04/2008)  Influenza Immunization History:    Influenza:  fluvax 3+ (11/03/2008)    Preventive Care Screening  Mammogram:    Date:  02/12/2009    Results:  normal   Last Pneumovax:    Date:  02/04/2008    Results:  Pneumovax   Pap Smear:    Date:  02/03/2006    Results:  normal   Colonoscopy:    Date:  02/03/2005    Results:  normal   Contraindications of Treatment or Deferment of Test/Procedure:    Test/Procedure: TD vaccine    Reason for deferment: declined     got really sick with colonosc -- declines any other  ones

## 2010-03-05 NOTE — Letter (Signed)
Summary: [facility]  [facility]   Imported By: Maryln Gottron 11/21/2009 15:11:14  _____________________________________________________________________  External Attachment:    Type:   Image     Comment:   External Document

## 2010-03-05 NOTE — Progress Notes (Signed)
  Phone Note Other Incoming   Summary of Call: pt was here with her husband for a visit and mentioned that she had rash under breasts/ abd and between legs - a little itchy  Follow-up for Phone Call        rash was red - patchy with some satellite lesions- I suspect yeast at this time and recommended lotrimin otc  I mentioned if rash spreads to let me and her psychiatrist know becasue that could mean drug reaction Follow-up by: Judith Part MD,  October 23, 2009 2:14 PM

## 2010-03-05 NOTE — Consult Note (Signed)
Summary: [facility]  [facility]   Imported By: Lanelle Bal 12/26/2009 14:54:11  _____________________________________________________________________  External Attachment:    Type:   Image     Comment:   External Document

## 2010-03-05 NOTE — Consult Note (Signed)
Summary: [facility]  [facility]   Imported By: Lanelle Bal 09/11/2009 11:18:46  _____________________________________________________________________  External Attachment:    Type:   Image     Comment:   External Document

## 2010-03-05 NOTE — Progress Notes (Signed)
Summary: pt has stopped zoloft  Phone Note Call from Patient   Caller: Patient Call For: Judith Part MD Summary of Call: Pt called to let you know that her psychiatrist took her off of the zoloft because of the rash she has. Initial call taken by: Lowella Petties CMA,  October 25, 2009 11:42 AM  Follow-up for Phone Call        thanks for the update -- let me know if he switched her to something else and what it is so I can adj her med list  Follow-up by: Judith Part MD,  October 25, 2009 12:46 PM  Additional Follow-up for Phone Call Additional follow up Details #1::        Left message for patient to call back. Lewanda Rife LPN  October 25, 2009 3:32 PM   Spoke with pt, she said her psych told her he couldnt change her to anything else until her rash clears up, and it is not.  He did add lorazepam .5 mg, to take one during the day and one at bedtime with temazepam.  She wants to be on something for depression, she sounds very down. Additional Follow-up by: Lowella Petties CMA,  October 26, 2009 10:13 AM   New Allergies: ! ZOLOFT Additional Follow-up for Phone Call Additional follow up Details #2::    she really has to follow the direction of her psychiatrist -- if he cannot safely start her on anything yet - she will have to wait  it is important for her to let him know the minute her rash starts to improve so he can take action if she feels worse or suicidal - needs to let Dr Donell Beers know / or if after hours go to [place] [facility] to be seen by behavioral health please copy this phone note and fax to Dr Plovsky's office - thanks Follow-up by: Judith Part MD,  October 26, 2009 10:53 AM  Additional Follow-up for Phone Call Additional follow up Details #3:: Details for Additional Follow-up Action Taken: Patient notified as instructed by telephone. Pt said the rash does not look any better and it may be spreading more. Pt said she would call back if rash got worse.copy of  note faxed to Dr Donell Beers as instructed.Lewanda Rife LPN  October 26, 2009 12:48 PM   New Allergies: ! ZOLOFT

## 2010-03-05 NOTE — Progress Notes (Signed)
  Phone Note Outgoing Call   Summary of Call: after obtaining verbal consent from the patient at her last visit - I was able to contact her daughter today to disc her depression.    Follow-up for Phone Call        according to Okey Regal -- Ms Croghan has not in fact been a very happy person throughout her life -- very controlling and with very strong OCD tendancies  (I was not suprised by this given her somewhat extreme reaction when I diagnosed her with HTN this year).. is obvious that loosing control of life or body is devastating to her  this depression may not be as new as we thought  no hx of abuse known  despite this depression- house and hygiene are still immaculate , and no suicidal thoughts known Okey Regal says she can be manipulative at times as well  I stressed imp of contact with Dr Donell Beers about her progress and I will relay this info to him as well  Follow-up by: Judith Part MD,  October 23, 2009 5:07 PM     Appended Document:  please send copy of this phone note to Dr Donell Beers her psychiatrist   Appended Document:  Copy of note faxed thru [facility] to Dr Donell Beers.

## 2010-03-05 NOTE — Assessment & Plan Note (Signed)
Summary: ROA F/U ON B/P  CYD   Vital Signs:  Patient profile:   77 year old female Height:      66 inches Weight:      113 pounds BMI:     18.30 Temp:     98.1 degrees F oral Pulse rate:   68 / minute Pulse rhythm:   regular BP sitting:   160 / 80  (left arm) Cuff size:   regular  Vitals Entered By: Lewanda Rife LPN (May 17, 2009 11:34 AM) CC: recheck BP, Back Pain   History of Present Illness: here for f/u of HTN   bp today is down from prev at 160/80-- though better than last time  did inc lisinopril to 40 mg daily- no side eff or problems  does eat low salt diet and get good exercise   also inc vit D to 2000 international units daily   dexa - OP  rev this from 1/11 has T score for spine that is quite low -4 disc risks of fx from this  she is very concerned about pot side eff of fosamax - though is having no side effects  I would like to re chekc this at a year (given hx of steroid use) -- but she says her insurance absolutely will not pay for this study more than every 2 years     Allergies: 1)  ! Amlodipine Besylate (Amlodipine Besylate) 2)  ! Penicillin 3)  ! Sulfa 4)  ! Azathioprine (Azathioprine)  Past History:  Past Medical History: Last updated: 17-May-2009 Autoimmune hepatitis 2008 Jaundice 2008 hypothyroid  HTN  OP  allergic rhinitis  bunions       Dr Rosine Beat --GI in [place]  Past Surgical History: Last updated: 17-May-2009 Cataract extraction left eye 2008 Cataract extraction right eye 2009 Tonsillectomy 1945 colonosc 2007 normal -- made her sick and does not ever want another one   Family History: Last updated: 05/17/09 Father: deceased heart disease Mother: deceased stroke and high BP Siblings: sister age 67 stroke deceased  Social History: Last updated: May 17, 2009 Retired Married Never Smoked Alcohol use-no Drug use-no daughter is Annalaya Wile- Fifield   Risk Factors: Smoking Status: never (05/17/2009)  Review of  Systems General:  Denies fatigue, loss of appetite, and malaise. Eyes:  Denies blurring and eye irritation. CV:  Denies chest pain or discomfort, lightheadness, near fainting, and palpitations. Resp:  Denies cough, shortness of breath, and wheezing. GI:  Denies abdominal pain, change in bowel habits, indigestion, and nausea. MS:  Complains of joint pain; denies joint redness, joint swelling, cramps, and muscle weakness; occasionally gets some fleeting joint pain- no more than that . Neuro:  Denies numbness and tingling. Endo:  Denies cold intolerance, excessive thirst, excessive urination, and heat intolerance.  Physical Exam  General:  slim and well app elderly femaile Head:  normocephalic, atraumatic, and no abnormalities observed.   Eyes:  vision grossly intact, pupils equal, pupils round, and pupils reactive to light.  no conjunctival pallor, injection or icterus  Mouth:  pharynx pink and moist.   Neck:  supple with full rom and no masses or thyromegally, no JVD or carotid bruit  Lungs:  Normal respiratory effort, chest expands symmetrically. Lungs are clear to auscultation, no crackles or wheezes. Heart:  Normal rate and regular rhythm. S1 and S2 normal without gallop, murmur, click, rub or other extra sounds. Msk:  No deformity or scoliosis noted of thoracic or lumbar spine.  no acute joint changes  Pulses:  R and L carotid,radial,femoral,dorsalis pedis and posterior tibial pulses are full and equal bilaterally Extremities:  No clubbing, cyanosis, edema, or deformity noted with normal full range of motion of all joints.   Neurologic:  gait normal and DTRs symmetrical and normal.  no tremor  Skin:  Intact without suspicious lesions or rashes Cervical Nodes:  No lymphadenopathy noted Psych:  pt is very dissapointed in her current med probs and options for treatment - we discussed this in much detail     Impression & Recommendations:  Problem # 1:  OSTEOPOROSIS  (ICD-733.00) Assessment Unchanged  rev last dexa with LS T score of -4- this is worrisome  disc nature of it and risk of fx pt continues fosamax -- disc poss side eff she is worried about I would like to re check dexa 1 year but pt tells me ins will not pay but every 2  enc 2000 international units vit D daily based on last labs  Her updated medication list for this problem includes:    Vitamin D 400 Unit Tabs (Cholecalciferol) .[name] Kitchen... Take two daily    Alendronate Sodium 70 Mg Tabs (Alendronate sodium) .[name] Kitchen... Take one capsule once a week    Calcium 500 Mg Tabs (Calcium) .[name] Kitchen... Take three tablets by mouth daily  Bone Density: osteoporosis (02/12/2009) Bone Density-Hip: -3.4 (02/02/2007) Bone Density-LS: -4.0 (02/12/2009)  Orders: Prescription Created Electronically [phone])  Problem # 2:  HYPERTENSION, BENIGN ESSENTIAL (ICD-401.1) Assessment: Improved  HTN is imp with inc lisinopril but not resolved  disc nature of HTN and why we treat it  lifestyle is already optimal  will add hctz and update if side eff or problems- disc what to exp f/u 3-4 wk for bp check and labs   Her updated medication list for this problem includes:    Lisinopril 40 Mg Tabs (Lisinopril) .[name] Kitchen... 1 by mouth once daily    Hydrochlorothiazide 25 Mg Tabs (Hydrochlorothiazide) .[name] Kitchen... 1 by mouth once daily  BP today: 160/80 Prior BP: 186/80 (04/26/2009)  Orders: Prescription Created Electronically [phone])  Complete Medication List: 1)  Vitamin D 400 Unit Tabs (Cholecalciferol) .... Take two daily 2)  Levothroid 50 Mcg Tabs (Levothyroxine sodium) .... Take one tablet daily 3)  Lisinopril 40 Mg Tabs (Lisinopril) .[name] Kitchen.. 1 by mouth once daily 4)  Mercaptopurine 50 Mg Tabs (Mercaptopurine) .... Take 1/2 tablet daily 5)  Alendronate Sodium 70 Mg Tabs (Alendronate sodium) .... Take one capsule once a week 6)  Calcium 500 Mg Tabs (Calcium) .... Take three tablets by mouth daily 7)  Hydrochlorothiazide 25 Mg Tabs  (Hydrochlorothiazide) .[name] Kitchen.. 1 by mouth once daily  Patient Instructions: 1)  start the hctz (new blood pressure medicine ) one pill daily in the am  2)  this will make you urinate more often shortly after taking it 3)  no change in other medicines  4)  will continue to watch your bone density - continue calcium and vitamin D and your fosamax  5)  follow up with me in 3-4 weeks for blood pressure check - and we may do labs as well (but you do not need to fast)  Prescriptions: HYDROCHLOROTHIAZIDE 25 MG TABS (HYDROCHLOROTHIAZIDE) 1 by mouth once daily  #30 x 11   Entered and Authorized by:   Judith Part MD   Signed by:   Judith Part MD on 05/17/2009   Method used:   Electronically to        [facility] [id number]* (retail)  [address]       [place], [place]  [address]       Ph: [phone]       Fax: [phone]   RxID:   [id number]   Current Allergies (reviewed today): ! AMLODIPINE BESYLATE (AMLODIPINE BESYLATE) ! PENICILLIN ! SULFA ! AZATHIOPRINE (AZATHIOPRINE)

## 2010-03-05 NOTE — Letter (Signed)
Summary: Health Maintenance Checklist  Health Maintenance Checklist   Imported By: Lanelle Bal 05/01/2009 13:55:01  _____________________________________________________________________  External Attachment:    Type:   Image     Comment:   External Document

## 2010-03-05 NOTE — Letter (Signed)
Summary: [facility]  [facility]   Imported By: Maryln Gottron 11/21/2009 15:04:31  _____________________________________________________________________  External Attachment:    Type:   Image     Comment:   External Document

## 2010-03-05 NOTE — Letter (Signed)
Summary: Letter to Patient Regarding Lab Results/[facility]  Letter to Patient Regarding Lab Results/[facility]   Imported By: Lanelle Bal 09/11/2009 11:20:11  _____________________________________________________________________  External Attachment:    Type:   Image     Comment:   External Document

## 2010-03-07 NOTE — Progress Notes (Signed)
Summary: creatnine & BUN  ---- Converted from flag ---- ---- 02/19/2010 4:27 PM, Newt Lukes MD wrote:   ---- 02/19/2010 4:26 PM, Newt Lukes MD wrote: please place in idx and let pt/dtr know - code 401.9 - thanks  ---- 02/19/2010 3:39 PM, Shelbie Proctor wrote: pt need bun and creatine  done - appt for MRI  friday jan 20,2012@4 :00 pm -per [place] anna ------------------------------       Additional Follow-up for Phone Call Additional follow up Details #2::    Called pt no ansew LMOM RTC Follow-up by: Orlan Leavens RMA,  February 20, 2010 8:12 AM  Additional Follow-up for Phone Call Additional follow up Details #3:: Details for Additional Follow-up Action Taken: Daughter called back gave md response. Put order in IDX for bun &creatnine Additional Follow-up by: Orlan Leavens RMA,  February 20, 2010 10:05 AM

## 2010-03-07 NOTE — Assessment & Plan Note (Signed)
Summary: 2-3 WK FU  STC   Vital Signs:  Patient profile:   77 year old female Height:      66 inches (167.64 cm) Weight:      106.12 pounds (48.24 kg) O2 Sat:      95 % on Room air Temp:     98.8 degrees F (37.11 degrees C) oral Pulse rate:   74 / minute BP sitting:   120 / 72  (left arm) Cuff size:   regular  Vitals Entered By: Orlan Leavens RMA (February 15, 2010 10:41 AM)  O2 Flow:  Room air CC: 2-3 week follow-up Is Patient Diabetic? No Pain Assessment Patient in pain? no        Primary Care Provider:  Newt Lukes MD  CC:  2-3 week follow-up.  History of Present Illness: here for f/u tremors, improved/resolved with med changes (reduction in remeron dose)  c/o episodic headache  no balance problems but ? transient blurred vision - denies weakness dtr VERY concerned for CVA?  reviewed chronic med issues:  1) autoimmune hep - follows with GI specialist in [place] for same - remote hx pred use, now on MP6 - reports compliance with ongoing medical treatment and no changes in medication dose or frequency. denies adverse side effects related to current therapy.   2) hypothyroid - reports compliance with ongoing medical treatment and no changes in medication dose or frequency. denies adverse side effects related to current therapy.   3) HTN - uncontrolled - reports compliance with ongoing medical treatment and no changes in medication dose or frequency. denies adverse side effects related to current therapy. describes prior intol of amlodipine and hctz  4) depression/anxiety - severe - personal and FH same - follows with psyc but looking for new provider, pending pyschology appt for counseling - hx rash on zoloft, sleep better with ativan and restoril. current doses remeron reviewed as well as poor tol of wellbutrin trial  5) osteoporosis - reports compliance with ongoing medical treatment and no changes in medication dose or frequency. denies adverse side effects related  to current therapy.   Clinical Review Panels:  Complete Metabolic Panel   Glucose:  90 (10/19/2009)   Sodium:  133 (10/19/2009)   Potassium:  4.2 (10/19/2009)   Chloride:  97 (10/19/2009)   CO2:  27 (10/19/2009)   BUN:  14 (10/19/2009)   Creatinine:  0.73 (10/19/2009)   Albumin:  4.0 (06/12/2009)   Calcium:  9.0 (10/19/2009)   Current Medications (verified): 1)  Levothroid 50 Mcg Tabs (Levothyroxine Sodium) .... Take One Tablet Daily 2)  Lisinopril 40 Mg Tabs (Lisinopril) .[name] Kitchen.. 1 By Mouth Once Daily 3)  Mercaptopurine 50 Mg Tabs (Mercaptopurine) .... Take 1/2 Tablet Daily 4)  Alendronate Sodium 70 Mg Tabs (Alendronate Sodium) .... Take One Capsule Once A Week 5)  Calcium 500 Mg Tabs (Calcium) .... Take Three Tablets By Mouth Daily 6)  Vitamin D 2000 Unit Tabs (Cholecalciferol) .... Take 1 Tablet By Mouth Once A Day 7)  Temazepam 15 Mg Caps (Temazepam) .[name] Kitchen.. 1 By Mouth At Bedtime May Repeat As Needed 8)  Lorazepam 0.5 Mg Tabs (Lorazepam) .... Take 1 Tablet By Mouth Two Times A Day 9)  Mirtazapine 45 Mg Tabs (Mirtazapine) .... Take 1 Tablet By Mouth Once A Day At Bedtime 10)  Atenolol 50 Mg Tabs (Atenolol) .... Take 1 Tablet By Mouth Once A Day 11)  Omron Blood Pressure Cuff .... To Check Bp Once Daily and As Needed For Htn  12)  Align  Caps (Probiotic Product) .[name] Kitchen.. 1 By Mouth Once Daily  Allergies (verified): 1)  ! Amlodipine Besylate (Amlodipine Besylate) 2)  ! Penicillin 3)  ! Sulfa 4)  ! Azathioprine (Azathioprine) 5)  ! * Hctz 6)  ! Zoloft  Past History:  Past Medical History: Autoimmune hepatitis  dx 2008     Jaundice 2008 hypothyroid  HTN  allergic rhinitis  bunions  depression/ anxiety major - SEVERE osteoporosis   MD roster: GI - Dr Rosine Beat  in [place] psyc - [facility]  Family History: Father: deceased heart disease Mother: deceased stroke and high BP  Siblings: sister age 53 stroke deceased  Social History: Retired Married Never Smoked Alcohol use-no Drug  use-no daughter is Soo Steelman- Fifield responsible for majority of supervison for parents  Review of Systems  The patient denies fever, vision loss, hoarseness, chest pain, syncope, and peripheral edema.         transient dizzy spell 2-3 weeks ago, lasted <24h - no current vision change, HA or weakness. (see HPI above)  Physical Exam  General:  slim and fatigued appearing elderly female. dtr at side (dtr is very anxious) Eyes:  vision grossly intact, pupils equal, pupils round, and pupils reactive to light.  no conjunctival pallor, injection or icterus no nystagmus Ears:  normal pinnae bilaterally, without erythema, swelling, or tenderness to palpation. TMs clear, without effusion, or cerumen impaction. Hearing grossly normal bilaterally  Mouth:  pharynx pink and moist.   Lungs:  normal respiratory effort, no intercostal retractions or use of accessory muscles; normal breath sounds bilaterally - no crackles and no wheezes.    Heart:  normal rate, regular rhythm, no murmur, and no rub. BLE without edema.  Neurologic:  sensation intact to light touch, gait normal, and DTRs symmetrical and normal.  resolved intention and resting tremor of hands Psych:  very depressed affect and anxious perseveration. complains of treatment at psychiatrist with med confusion; also fear of being old and "all the bad things that happen when you are old"   Impression & Recommendations:  Problem # 1:  HEADACHE (ICD-784.0) episodic, also with transient dizziness - no imaging done since onset of major depression  MRI brain approp given these 2 events- ordered today rec ASA 81qd until further eval complete - neuro exam today benign - dtr offered reassurance Her updated medication list for this problem includes:    Atenolol 50 Mg Tabs (Atenolol) .[name] Kitchen... Take 1 tablet by mouth once a day    Aspirin Ec 81 Mg Tbec (Aspirin) .[name] Kitchen... 1 by mouth once daily  Orders: Radiology Referral (Radiology)  Problem # 2:   DEPRESSION, MAJOR, MODERATE (ICD-296.22)  Orders: Radiology Referral (Radiology) Psychiatric Referral (Psych)  Orders: Psychology Referral (Psychology)  overall making some slow imp on mirtazapine?, better with med dose reduction 01/2010 intol to wellbutrin and zoloft reviewed pt interested in different psych practice due to disorganization at current one  discussed ref to new practice recommend continuing plan from Dr Donell Beers at this time until new practice is found   also reminded [facility] or call [facility] if severe symptoms or si before est with new group also f/u behav health counseling for support pt and dtr describe understanding of same  Problem # 3:  TREMOR (ICD-781.0) Assessment: Improved  suspect was related to remeron, hx indicates pt taking 75mg  resolved symtpoms now on 45mg  at bedtime. pt reports improvement in tremor since stopping wellbutrin, to remain off same. recent labs reviewed,  exam otherwise benign reassurance provided  Complete Medication List: 1)  Levothroid 50 Mcg Tabs (Levothyroxine sodium) .... Take one tablet daily 2)  Lisinopril 40 Mg Tabs (Lisinopril) .[name] Kitchen.. 1 by mouth once daily 3)  Mercaptopurine 50 Mg Tabs (Mercaptopurine) .... Take 1/2 tablet daily 4)  Alendronate Sodium 70 Mg Tabs (Alendronate sodium) .... Take one capsule once a week 5)  Calcium 500 Mg Tabs (Calcium) .... Take three tablets by mouth daily 6)  Vitamin D 2000 Unit Tabs (Cholecalciferol) .... Take 1 tablet by mouth once a day 7)  Temazepam 15 Mg Caps (Temazepam) .[name] Kitchen.. 1 by mouth at bedtime as needed 8)  Lorazepam 0.5 Mg Tabs (Lorazepam) .... Take 1 tablet by mouth two times a day 9)  Mirtazapine 45 Mg Tabs (Mirtazapine) .... Take 1 tablet by mouth once a day at bedtime 10)  Atenolol 50 Mg Tabs (Atenolol) .... Take 1 tablet by mouth once a day 11)  Omron Blood Pressure Cuff  .... To check bp once daily and as needed for htn 12)  Align Caps (Probiotic product) .[name] Kitchen.. 1 by mouth once  daily 13)  Aspirin Ec 81 Mg Tbec (Aspirin) .[name] Kitchen.. 1 by mouth once daily  Patient Instructions: 1)  it was good to see you today. 2)  start aspirin 81mg  once daily and use temazepam only if needed for sleep - no other medication changes 3)  we'll make referral for MRI brain  due to headache and vision and depression symptoms. Our office will contact you regarding this appointment once made.  4)  will look into new psychiatrist for you - continue with dr. Donell Beers until new doctor established 5)  Please schedule a follow-up appointment in 3 months to review medications and check thyroid, call sooner if problems.    Orders Added: 1)  Radiology Referral [Radiology] 2)  Est. Patient Level IV [[address]] 3)  Psychiatric Referral [Psych]

## 2010-03-07 NOTE — Assessment & Plan Note (Signed)
Summary: New / Medicare / # /cd   Vital Signs:  Patient profile:   77 year old female Height:      66 inches Weight:      107.50 pounds BMI:     17.41 O2 Sat:      96 % on Room air Temp:     97.8 degrees F oral Pulse rate:   61 / minute BP sitting:   184 / 82  (left arm) Cuff size:   regular  Vitals Entered By: Margaret Pyle, CMA (February 01, 2010 2:15 PM)  O2 Flow:  Room air CC: Establish Care, shaking hands X 1 week   Primary Care Provider:  Newt Lukes MD  CC:  Establish Care and shaking hands X 1 week.  History of Present Illness: new to me but known to our group - recently followed at [facility] location - here to est care  c/o tremors, esp b hands. onset 4-5 days ago. correlates with recent inc in depression meds (remeron), denies weight loss, palp or fever. no headache or balance problems. slow improvement over past 2 days since stopping wellbutrin trial but not normal yet.  reviewed chronic med issues:  1) autoimmune hep - follows with GI specialist in [place] for same - remote hx pred use, now on MP6 - reports compliance with ongoing medical treatment and no changes in medication dose or frequency. denies adverse side effects related to current therapy.   2) hypothyroid - reports compliance with ongoing medical treatment and no changes in medication dose or frequency. denies adverse side effects related to current therapy. describes prior intol of amlodipine and hctz  3) HTN - uncontrolled - reports compliance with ongoing medical treatment and no changes in medication dose or frequency. denies adverse side effects related to current therapy.   4) depression/anxiety - severe - personal and FH same - follows with psyc but not pyschology for counseling - hx rash on zoloft, sleep better with ativan  and restoril. inc doses remeron reviewed when poor tol of wellbutrin  5) osteoporosis - reports compliance with ongoing medical treatment and no changes in medication  dose or frequency. denies adverse side effects related to current therapy.   Allergies: 1)  ! Amlodipine Besylate (Amlodipine Besylate) 2)  ! Penicillin 3)  ! Sulfa 4)  ! Azathioprine (Azathioprine) 5)  ! * Hctz 6)  ! Zoloft  Past History:  Past Medical History: Autoimmune hepatitis  dx 2008     Jaundice 2008 hypothyroid  HTN  allergic rhinitis  bunions  depression/ anxiety major osteoporosis  MD roster: GI - Dr Rosine Beat  in [facility] psyc - [facility]  Past Surgical History: Cataract extraction left eye 2008 Cataract extraction right eye 2009  Tonsillectomy 1945 colonosc 2007 normal -- made her sick and does not ever want another one   Family History: Reviewed history from 04/26/2009 and no changes required. Father: deceased heart disease Mother: deceased stroke and high BP Siblings: sister age 78 stroke deceased  Social History: Reviewed history from 04/26/2009 and no changes required. Retired Married Never Smoked Alcohol use-no Drug use-no daughter is [place] Sherr- [place]   Review of Systems       c/o chronic constipation, not helped with stool softners; otherwise, see HPI above. I have reviewed all other systems and they were negative.   Physical Exam  General:  slim fatigued appearing elderly female. dtr at side Eyes:  vision grossly intact, pupils equal, pupils round, and pupils reactive to light.  no conjunctival pallor, injection or icterus  Neck:  supple, full ROM, no masses, no thyromegaly; no thyroid nodules or tenderness. no JVD or carotid bruits.   Lungs:  normal respiratory effort, no intercostal retractions or use of accessory muscles; normal breath sounds bilaterally - no crackles and no wheezes.    Heart:  normal rate, regular rhythm, no murmur, and no rub. BLE without edema.  Msk:  No deformity or scoliosis noted of thoracic or lumbar spine.  no acute joint changes  kyphosis noted  Neurologic:  sensation intact to light touch, gait normal, and DTRs  symmetrical and normal.  mild intention and resting tremor of hands Skin:  Intact without suspicious lesions or rashes Psych:  very depressed affect and anxiety complains of treatment at psychiatrist with med confusion her daughter is helpful with obt history   Impression & Recommendations:  Problem # 1:  TREMOR (ICD-781.0) suspect related to remeron, hx indicates pt taking 75mg , reduce now to 45mg  at bedtime. pt reports some improvement in tremor since stopping wellbutrin 48h ago, to remain off same. recent labs reviewed, exam otherwise benign reassurance provided  Problem # 2:  DEPRESSION, MAJOR, MODERATE (ICD-296.22)  Orders: Psychology Referral (Psychology)  overall making some slow imp on mirtazapine , but dose confusion uncovered today intol to wellbutrin and zoloft reviewed pt interested in different psych practice due to disorganization at current one  disc ref to new practice recommend continuing plan from Dr Donell Beers at this time until new practice is found  provided name of different md, also instructed in need to fo to [facility] or call 911 if severe symptoms or si before est with new group also refer for behav health counseling for support pt and dtr describe understanding of same  Problem # 3:  ANXIETY (ICD-300.00)  Her updated medication list for this problem includes:    Lorazepam 0.5 Mg Tabs (Lorazepam) .[name] Kitchen... Take 1 tablet by mouth two times a day    Mirtazapine 45 Mg Tabs (Mirtazapine) .[name] Kitchen... Take 1 tablet by mouth once a day at bedtime  Problem # 4:  HYPERTENSION, BENIGN ESSENTIAL (ICD-401.1)  uncontrolled today, hesitant to make multiple med changes at one time due to pt sensitivity to meds and ?confusion psyc med adjust today, to watch bp at ome recheck here 2-3 weeks to next adjust bp meds, sooner if symptoms  into, to amlodipine and hctz reviewed Her updated medication list for this problem includes:    Lisinopril 40 Mg Tabs (Lisinopril) .[name] Kitchen... 1 by mouth  once daily    Atenolol 50 Mg Tabs (Atenolol) .[name] Kitchen... Take 1 tablet by mouth once a day  BP today: 184/82 Prior BP: 158/68 (12/11/2009)  Labs Reviewed: K+: 4.2 (10/19/2009) Creat: : 0.73 (10/19/2009)     Problem # 5:  HYPOTHYROIDISM (ICD-244.9) may need to check tsh though reports recentlydone. will review and send for prior records as needed  Her updated medication list for this problem includes:    Levothroid 50 Mcg Tabs (Levothyroxine sodium) .[name] Kitchen... Take one tablet daily  Problem # 6:  OSTEOPOROSIS (ICD-733.00)  Her updated medication list for this problem includes:    Alendronate Sodium 70 Mg Tabs (Alendronate sodium) .[name] Kitchen... Take one capsule once a week  rev last dexa 02/2009 with LS T score of -4- this is worrisome  disc nature of it and risk of fx pt continues fosamax -- disc poss side eff she is worried about increased toc 2000 international units vit D daily based on last labs  but may exac constipation, take as tol    Alendronate Sodium 70 Mg Tabs (Alendronate sodium) .[name] Kitchen... Take one capsule once a week    Calcium 500 Mg Tabs (Calcium) .[name] Kitchen... Take three tablets by mouth daily  Bone Density: osteoporosis (02/12/2009) Bone Density-Hip: -3.4 (02/02/2007) Bone Density-LS: -4.0 (02/12/2009)  Discussed medication use, applications of heat or ice, and exercises.   Time spent with patient/dtr 42 minutes, more than 50% of this time was spent counseling patient on depression and problems concerning the titration and trail of various medications, need for new psyc and counseling support, med adjustment and plans for close f/u  Complete Medication List: 1)  Levothroid 50 Mcg Tabs (Levothyroxine sodium) .... Take one tablet daily 2)  Lisinopril 40 Mg Tabs (Lisinopril) .[name] Kitchen.. 1 by mouth once daily 3)  Mercaptopurine 50 Mg Tabs (Mercaptopurine) .... Take 1/2 tablet daily 4)  Alendronate Sodium 70 Mg Tabs (Alendronate sodium) .... Take one capsule once a week 5)  Calcium 500 Mg Tabs (Calcium)  .... Take three tablets by mouth daily 6)  Vitamin D 2000 Unit Tabs (Cholecalciferol) .... Take 1 tablet by mouth once a day 7)  Temazepam 15 Mg Caps (Temazepam) .[name] Kitchen.. 1 by mouth at bedtime may repeat as needed 8)  Lorazepam 0.5 Mg Tabs (Lorazepam) .... Take 1 tablet by mouth two times a day 9)  Mirtazapine 45 Mg Tabs (Mirtazapine) .... Take 1 tablet by mouth once a day at bedtime 10)  Atenolol 50 Mg Tabs (Atenolol) .... Take 1 tablet by mouth once a day 11)  Omron Blood Pressure Cuff  .... To check bp once daily and as needed for htn 12)  Align Caps (Probiotic product) .[name] Kitchen.. 1 by mouth once daily  Patient Instructions: 1)  it was good to see you today. 2)  medications and medical history reviewed today 3)  eat yogurt daily and try Align (probiotic) daily (sample given) - use everyday for 30days to help constipation 4)  if still constipated, take scoop of Miralax in 8 oz water each day as needed  5)  nighttime medications as we discussed and as listed below 6)  no other medcation changes today 7)  we'll make referral to [facility] for counseling. Our office will contact you regarding this appointment once made.  8)  call dr. Evelene Croon for new psychiatry doctor [phone] - if she is not taking your insurance let us know and we will give more names 9)  Please schedule a follow-up appointment in 2-3 weeks to review medications and check blood pressure, call sooner if problems.    Orders Added: 1)  Psychology Referral [Psychology] 2)  Est. Patient Level V [45409]

## 2010-03-11 ENCOUNTER — Ambulatory Visit: Payer: Self-pay | Admitting: Licensed Clinical Social Worker

## 2010-04-01 ENCOUNTER — Telehealth: Payer: Self-pay | Admitting: Internal Medicine

## 2010-04-02 ENCOUNTER — Telehealth: Payer: Self-pay | Admitting: Internal Medicine

## 2010-04-03 ENCOUNTER — Encounter: Payer: Self-pay | Admitting: Internal Medicine

## 2010-04-03 ENCOUNTER — Ambulatory Visit (INDEPENDENT_AMBULATORY_CARE_PROVIDER_SITE_OTHER): Payer: Medicare Other | Admitting: Internal Medicine

## 2010-04-03 ENCOUNTER — Ambulatory Visit: Payer: Self-pay | Admitting: Internal Medicine

## 2010-04-03 DIAGNOSIS — I10 Essential (primary) hypertension: Secondary | ICD-10-CM

## 2010-04-11 NOTE — Progress Notes (Signed)
Summary: EKG for Marisa Smith   Phone Note Other Incoming   Caller: Dr. Donell Beers Summary of Call: You will be seeing this patient tomorrow (04/03/2010) at 9:15. Dr. Donell Beers is requesting an EKG to be done at the [facility] and fax a copy to his office at [phone]. He is changing her medication and requested an updated EKG. Initial call taken by: Robin Ewing CMA Duncan Dull),  April 02, 2010 1:09 PM  Follow-up for Phone Call        ok - thanks Follow-up by: Newt Lukes MD,  April 02, 2010 1:30 PM

## 2010-04-11 NOTE — Progress Notes (Signed)
Summary: Elevated BP  Phone Note Call from Patient   Caller: Daughter [phone] Summary of Call: Pt daughter called stating pt's BP is elevated at 186/74. Daughter is requesting appt although she was advised that VAL schedule is full. I advised daughter that I will inform VAL of pt's BP and call back with advisement. Initial call taken by: Margaret Pyle, CMA,  April 01, 2010 9:38 AM  Follow-up for Phone Call        increase dose atenolol to 75mg  once daily (1 + 1/2  of 50mg  atenolol tab) and sched for ROV this week to reeval same Follow-up by: Newt Lukes MD,  April 01, 2010 12:27 PM  Additional Follow-up for Phone Call Additional follow up Details #1::        pt daughter advised in detail and transferred to schedule appt Additional Follow-up by: Margaret Pyle, CMA,  April 01, 2010 1:14 PM    New/Updated Medications: ATENOLOL 50 MG TABS (ATENOLOL) Take 1+1/2 tablet by mouth once a day

## 2010-04-11 NOTE — Assessment & Plan Note (Signed)
Summary: BP IS ELEVATED /PER TRIAGE /[place]   Vital Signs:  Patient profile:   77 year old female Height:      66 inches (167.64 cm) Weight:      106 pounds (48.18 kg) O2 Sat:      97 % on Room air Temp:     98.1 degrees F (36.72 degrees C) oral Pulse rate:   46 / minute BP sitting:   138 / 68  (left arm) Cuff size:   regular  Vitals Entered By: Orlan Leavens RMA (April 03, 2010 10:13 AM)  O2 Flow:  Room air CC: Elevated BP Is Patient Diabetic? No Pain Assessment Patient in pain? no      Comments Pt states top number been running between 177-190's   Primary Care Provider:  Newt Lukes MD  CC:  Elevated BP.  History of Present Illness: here for elevated BP - inc dose Bbloc by phone earlier this week no CP, HA change or swelling- see below no SOB or dizziness, no syncope or presyncope symptoms  BP better since med change  also reviewed chronic med issues:  1) autoimmune hep - follows with GI specialist in [place] for same - remote hx pred use, now on MP6 - reports compliance with ongoing medical treatment and no changes in medication dose or frequency. denies adverse side effects related to current therapy.   2) hypothyroid - reports compliance with ongoing medical treatment and no changes in medication dose or frequency. denies adverse side effects related to current therapy.   3) HTN - uncontrolled - reports compliance with ongoing medical treatment and no changes in medication dose or frequency. denies adverse side effects related to current therapy. describes prior intol of amlodipine and hctz  4) depression/anxiety - severe - personal and FH same - follows with psyc but looking for new provider, pending pyschology appt for counseling - hx rash on zoloft, sleep better with ativan and restoril. current doses remeron reviewed as well as poor tol of wellbutrin trial  5) osteoporosis - reports compliance with ongoing medical treatment and no changes in medication dose or  frequency. denies adverse side effects related to current therapy.   Clinical Review Panels:  Complete Metabolic Panel   Glucose:  90 (10/19/2009)   Sodium:  133 (10/19/2009)   Potassium:  4.2 (10/19/2009)   Chloride:  97 (10/19/2009)   CO2:  27 (10/19/2009)   BUN:  13 (02/21/2010)   Creatinine:  0.6 (02/21/2010)   Albumin:  4.0 (06/12/2009)   Calcium:  9.0 (10/19/2009)   Current Medications (verified): 1)  Levothroid 50 Mcg Tabs (Levothyroxine Sodium) .... Take One Tablet Daily 2)  Lisinopril 40 Mg Tabs (Lisinopril) .[name] Kitchen.. 1 By Mouth Once Daily 3)  Mercaptopurine 50 Mg Tabs (Mercaptopurine) .... Take 1/2 Tablet Daily 4)  Alendronate Sodium 70 Mg Tabs (Alendronate Sodium) .... Take One Capsule Once A Week 5)  Calcium 500 Mg Tabs (Calcium) .... Take Three Tablets By Mouth Daily 6)  Vitamin D 2000 Unit Tabs (Cholecalciferol) .... Take 1 Tablet By Mouth Once A Day 7)  Temazepam 15 Mg Caps (Temazepam) .[name] Kitchen.. 1 By Mouth At Bedtime As Needed 8)  Lorazepam 0.5 Mg Tabs (Lorazepam) .... Take 1 Tablet By Mouth Two Times A Day 9)  Mirtazapine 45 Mg Tabs (Mirtazapine) .... Take 1 Tablet By Mouth Once A Day At Bedtime 10)  Atenolol 50 Mg Tabs (Atenolol) .... Take 1+1/2 Tablet By Mouth Once A Day 11)  Omron Blood Pressure Cuff .[name] KitchenMarland KitchenMarland Kitchen  To Check Bp Once Daily and As Needed For Htn 12)  Align  Caps (Probiotic Product) .[name] Kitchen.. 1 By Mouth Once Daily 13)  Aspirin Ec 81 Mg Tbec (Aspirin) .[name] Kitchen.. 1 By Mouth Once Daily  Allergies (verified): 1)  ! Amlodipine Besylate (Amlodipine Besylate) 2)  ! Penicillin 3)  ! Sulfa 4)  ! Azathioprine (Azathioprine) 5)  ! * Hctz 6)  ! Zoloft  Past History:  Past Medical History: Autoimmune hepatitis  dx 2008     Jaundice 2008 hypothyroid   HTN  allergic rhinitis  bunions  depression/ anxiety major - SEVERE osteoporosis   MD roster: GI - Dr Rosine Beat  in [place] psyc - [facility]  Review of Systems  The patient denies anorexia, fever, syncope, and abdominal pain.     Physical Exam  General:  slim and fatigued appearing elderly female. dtr at side (dtr is very anxious) Lungs:  normal respiratory effort, no intercostal retractions or use of accessory muscles; normal breath sounds bilaterally - no crackles and no wheezes.    Heart:  normal rate, regular rhythm, no murmur, and no rub. BLE without edema.  Psych:  very depressed affect and anxious perseveration. also expresses fear of medication changes and aging process   Impression & Recommendations:  Problem # 1:  HYPERTENSION, BENIGN ESSENTIAL (ICD-401.1)  Her updated medication list for this problem includes:    Lisinopril 40 Mg Tabs (Lisinopril) .[name] Kitchen... 1 by mouth once daily    Atenolol 50 Mg Tabs (Atenolol) .[name] Kitchen... Take 1+1/2 tablet by mouth once a day  uncontrolled and exac by depression/anxiety and recent med changes hesitant to make additional med changes today as improved with inc dose Bbloc EKG reviewed - HR 51 with PR .221 - ok to fax to pysc reviewed pt sensitivity to meds -amlodipine and hctz intol reviewed  BP today: 138/68 Prior BP: 120/72 (02/15/2010) Prior BP: 158/68 (12/11/2009)  Labs Reviewed: K+: 4.2 (10/19/2009) Creat: : 0.6 (02/21/2010)     Time spent with patient and dtr 26 minutes, more than 50% of this time was spent counseling patient on hypertension, depression and recent med changes  Orders: EKG w/ Interpretation (93000)  Complete Medication List: 1)  Levothroid 50 Mcg Tabs (Levothyroxine sodium) .... Take one tablet daily 2)  Lisinopril 40 Mg Tabs (Lisinopril) .[name] Kitchen.. 1 by mouth once daily 3)  Mercaptopurine 50 Mg Tabs (Mercaptopurine) .... Take 1/2 tablet daily 4)  Alendronate Sodium 70 Mg Tabs (Alendronate sodium) .... Take one capsule once a week 5)  Calcium 500 Mg Tabs (Calcium) .... Take three tablets by mouth daily 6)  Vitamin D 2000 Unit Tabs (Cholecalciferol) .... Take 1 tablet by mouth once a day 7)  Temazepam 15 Mg Caps (Temazepam) .[name] Kitchen.. 1 by mouth at  bedtime as needed 8)  Lorazepam 0.5 Mg Tabs (Lorazepam) .... Take 1 tablet by mouth two times a day 9)  Mirtazapine 45 Mg Tabs (Mirtazapine) .... Take 1 tablet by mouth once a day at bedtime 10)  Atenolol 50 Mg Tabs (Atenolol) .... Take 1+1/2 tablet by mouth once a day 11)  Omron Blood Pressure Cuff  .... To check bp once daily and as needed for htn 12)  Align Caps (Probiotic product) .[name] Kitchen.. 1 by mouth once daily 13)  Aspirin Ec 81 Mg Tbec (Aspirin) .[name] Kitchen.. 1 by mouth once daily 14)  Venlafaxine Hcl 37.5 Mg Xr24h-cap (Venlafaxine hcl) .[name] Kitchen.. 1 by mouth once daily (per dr. Donell Beers)  Patient Instructions: 1)  it was good to see you today.  2)  no other medication changes today - call when/if early refills needed on atenolol due to the increased dose 3)  EKG to day - will fax copy to Dr. Donell Beers as discussed 4)  Please keep scheduled follow-up appointment in April as discussed, call sooner if problems.  5)  .   Orders Added: 1)  Est. Patient Level IV [11914] 2)  EKG w/ Interpretation [93000]

## 2010-04-12 ENCOUNTER — Encounter: Payer: Self-pay | Admitting: Internal Medicine

## 2010-04-12 ENCOUNTER — Encounter: Payer: Self-pay | Admitting: Family Medicine

## 2010-04-14 ENCOUNTER — Encounter: Payer: Self-pay | Admitting: Internal Medicine

## 2010-04-23 ENCOUNTER — Other Ambulatory Visit: Payer: Self-pay

## 2010-04-23 MED ORDER — ATENOLOL 50 MG PO TABS: 75.0000 mg | ORAL_TABLET | Freq: Every day | ORAL | Status: DC

## 2010-04-23 NOTE — Letter (Addendum)
Summary: [facility]   Imported By: Kassie Mends 04/17/2010 09:35:31  _____________________________________________________________________  External Attachment:    Type:   Image     Comment:   External Document

## 2010-04-23 NOTE — Consult Note (Addendum)
Summary: [facility]  [facility]   Imported By: Sherian Rein 04/17/2010 09:34:10  _____________________________________________________________________  External Attachment:    Type:   Image     Comment:   External Document

## 2010-04-23 NOTE — Telephone Encounter (Signed)
Pharmacy called stating that pt has tried to refill Atenolol but is not due. Pt informed pharmacy that she was increased to 1 1/2 tab daily. Pharmacy is requesting new Rx

## 2010-04-24 ENCOUNTER — Encounter: Payer: Self-pay | Admitting: *Deleted

## 2010-04-30 ENCOUNTER — Other Ambulatory Visit: Payer: Self-pay | Admitting: Internal Medicine

## 2010-05-13 ENCOUNTER — Other Ambulatory Visit: Payer: Self-pay | Admitting: Internal Medicine

## 2010-05-31 ENCOUNTER — Encounter: Payer: Self-pay | Admitting: Internal Medicine

## 2010-05-31 ENCOUNTER — Other Ambulatory Visit (INDEPENDENT_AMBULATORY_CARE_PROVIDER_SITE_OTHER): Payer: Medicare Other

## 2010-05-31 ENCOUNTER — Ambulatory Visit (INDEPENDENT_AMBULATORY_CARE_PROVIDER_SITE_OTHER): Payer: Medicare Other | Admitting: Internal Medicine

## 2010-05-31 DIAGNOSIS — E039 Hypothyroidism, unspecified: Secondary | ICD-10-CM

## 2010-05-31 DIAGNOSIS — I10 Essential (primary) hypertension: Secondary | ICD-10-CM

## 2010-05-31 DIAGNOSIS — F321 Major depressive disorder, single episode, moderate: Secondary | ICD-10-CM

## 2010-05-31 LAB — TSH: TSH: 3.39 u[IU]/mL (ref 0.35–5.50)

## 2010-05-31 NOTE — Patient Instructions (Signed)
It was good to see you today. Medications reviewed, no changes at this time. Test(s) ordered today. Your results will be called to you after review (48-72hours after test completion). If any changes need to be made, you will be notified at that time. Please schedule followup in 4 months, call sooner if problems.

## 2010-05-31 NOTE — Progress Notes (Signed)
  Subjective:    Patient ID: Marisa Smith, female    DOB: 27-Jul-1924, 77 y.o.   MRN: [id number]  HPI  here for follow up - reviewed chronic med issues:  autoimmune hep - follows with GI specialist in [place] for same - remote hx pred use, now on MP6 - reports compliance with ongoing medical treatment and no changes in medication dose or frequency. denies adverse side effects related to current therapy.   hypothyroid - reports compliance with ongoing medical treatment and no changes in medication dose or frequency. denies adverse side effects related to current therapy.   HTN - uncontrolled - reports compliance with ongoing medical treatment and no changes in medication dose or frequency. denies adverse side effects related to current therapy. describes prior intol of amlodipine and hctz  depression/anxiety - severe symptoms improving at this time- personal and FH same - follows with psyc for meds and  pyschology appt for counseling - hx rash on zoloft, sleep better with ativan and restoril. current doses/meds reviewed as well as poor tol of wellbutrin trial  osteoporosis - reports compliance with ongoing medical treatment and no changes in medication dose or frequency. denies adverse side effects related to current therapy.  Past Medical History  Diagnosis Date  . Unspecified vitamin D deficiency   . TREMOR   . Headache 02/15/2010  . ALLERGIC RHINITIS CAUSE UNSPECIFIED   . ANXIETY   . DEPRESSION, MAJOR, MODERATE   . HYPERTENSION, BENIGN ESSENTIAL   . HYPOTHYROIDISM   . OSTEOPOROSIS       Review of Systems  Constitutional: Positive for fatigue. Negative for fever.  Respiratory: Negative for shortness of breath.   Cardiovascular: Positive for palpitations. Negative for chest pain and leg swelling.  Neurological: Positive for dizziness and headaches.       Objective:   Physical Exam  Constitutional: She is oriented to person, place, and time. She appears well-developed and  well-nourished.  Cardiovascular: Normal rate, regular rhythm and normal heart sounds.   Pulmonary/Chest: Effort normal and breath sounds normal. No respiratory distress. She has no wheezes.  Neurological: She is alert and oriented to person, place, and time. No cranial nerve deficit. Coordination normal.  Psychiatric: She has a normal mood and affect. Her behavior is normal. Thought content normal.       Mildly anxious, much less depressed than prior visits   Lab Results  Component Value Date   NA 133* 10/19/2009   K 4.2 10/19/2009   CL 97 10/19/2009   CREATININE 0.6 02/21/2010   BUN 13 02/21/2010   CO2 27 10/19/2009       Assessment & Plan:  See problem list. Medications and labs reviewed today.

## 2010-05-31 NOTE — Assessment & Plan Note (Signed)
The current medical regimen is effective;  continue present plan and medications. BP Readings from Last 3 Encounters:  05/31/10 120/72  04/03/10 138/68  02/15/10 120/72

## 2010-05-31 NOTE — Assessment & Plan Note (Signed)
No results found for this basename: TSH   Will check tsh now - The current medical regimen is effective;  continue present plan and medications.

## 2010-05-31 NOTE — Assessment & Plan Note (Signed)
symptoms much improved on current med combination Continue to work with psyc (plovsky) as ongoing The current medical regimen is effective;  continue present plan and medications.

## 2010-06-02 ENCOUNTER — Telehealth: Payer: Self-pay | Admitting: Internal Medicine

## 2010-06-02 NOTE — Telephone Encounter (Signed)
Please call patient - normal or stable lab results. No medication changes recommended. Thanks.  Lab Results  Component Value Date   TSH 3.39 05/31/2010

## 2010-06-03 NOTE — Telephone Encounter (Signed)
Pt Notified with lab results.[name] KitchenMarland Kitchen4/30/12@12 :03pm/LMB

## 2010-09-19 ENCOUNTER — Other Ambulatory Visit: Payer: Self-pay | Admitting: Internal Medicine

## 2010-09-27 ENCOUNTER — Ambulatory Visit (INDEPENDENT_AMBULATORY_CARE_PROVIDER_SITE_OTHER): Payer: Medicare Other | Admitting: Internal Medicine

## 2010-09-27 ENCOUNTER — Other Ambulatory Visit (INDEPENDENT_AMBULATORY_CARE_PROVIDER_SITE_OTHER): Payer: Medicare Other

## 2010-09-27 ENCOUNTER — Encounter: Payer: Self-pay | Admitting: Internal Medicine

## 2010-09-27 VITALS — BP 168/62 | HR 50 | Temp 98.4°F | Ht 69.0 in | Wt 108.8 lb

## 2010-09-27 DIAGNOSIS — K754 Autoimmune hepatitis: Secondary | ICD-10-CM

## 2010-09-27 DIAGNOSIS — E039 Hypothyroidism, unspecified: Secondary | ICD-10-CM

## 2010-09-27 DIAGNOSIS — F321 Major depressive disorder, single episode, moderate: Secondary | ICD-10-CM

## 2010-09-27 DIAGNOSIS — I10 Essential (primary) hypertension: Secondary | ICD-10-CM

## 2010-09-27 LAB — TSH: TSH: 2.44 u[IU]/mL (ref 0.35–5.50)

## 2010-09-27 NOTE — Assessment & Plan Note (Addendum)
Labile but overall controlled - Will monitor and defer changes at this time  BP Readings from Last 3 Encounters:  09/27/10 168/62  05/31/10 120/72  04/03/10 138/68

## 2010-09-27 NOTE — Assessment & Plan Note (Signed)
symptoms much improved on current med combination Continue to work with psyc (plovsky) as ongoing The current medical regimen is effective;  continue present plan and medications.

## 2010-09-27 NOTE — Progress Notes (Signed)
  Subjective:    Patient ID: Marisa Smith, female    DOB: [birth year], 77 y.o.   MRN: [id number]  HPI  here for follow up - reviewed chronic med issues:  autoimmune hep - follows with GI specialist in [place] for same, but would like to establish with [facility] -  remote hx pred use, now on MP6 - reports compliance with ongoing medical treatment and no changes in medication dose or frequency. denies adverse side effects related to current therapy.   hypothyroid - reports compliance with ongoing medical treatment and no changes in medication dose or frequency. denies adverse side effects related to current therapy.   HTN - uncontrolled/labile - reports compliance with ongoing medical treatment and no changes in medication dose or frequency. denies adverse side effects related to current therapy. describes prior intol of amlodipine and hctz  depression/anxiety - severe symptoms hx - remains labile but improved at this time- long personal hx and FH same - follows with psyc for meds and pyschology appt for counseling - hx rash on zoloft, sleep better with ativan and restoril. current doses/meds reviewed as well as poor tol of wellbutrin trial; on/off Abilify trials  osteoporosis - reports compliance with ongoing medical treatment and no changes in medication dose or frequency. denies adverse side effects related to current therapy.  Past Medical History  Diagnosis Date  . Unspecified vitamin D deficiency   . TREMOR   . Headache 02/15/2010  . ALLERGIC RHINITIS CAUSE UNSPECIFIED   . ANXIETY   . DEPRESSION, MAJOR, MODERATE   . HYPERTENSION, BENIGN ESSENTIAL   . HYPOTHYROIDISM   . OSTEOPOROSIS   . Autoimmune hepatitis     on MP6, prev pred     Review of Systems  Constitutional: Positive for fatigue. Negative for unexpected weight change.  Respiratory: Negative for shortness of breath.   Cardiovascular: Negative for chest pain.       Objective:   Physical Exam  BP 168/62  Pulse 50  Temp(Src)  98.4 F (36.9 C) (Oral)  Ht 5\' 9"  (1.753 m)  Wt 108 lb 12.8 oz (49.351 kg)  BMI 16.07 kg/m2  SpO2 98%  Constitutional: She is oriented to person, place, and time. She appears well-developed and well-nourished.  Cardiovascular: Normal rate, regular rhythm and normal heart sounds.  no BLE edema Pulmonary/Chest: Effort normal and breath sounds normal. No respiratory distress. She has no wheezes.  Neurological: She is alert and oriented to person, place, and time. No cranial nerve deficit. Coordination normal.  Psychiatric: She has anxious mood and affect. Her behavior is normal. Thought content normal. less depressed than prior visits    Lab Results  Component Value Date   NA 133* 10/19/2009   K 4.2 10/19/2009   CL 97 10/19/2009   CREATININE 0.6 02/21/2010   BUN 13 02/21/2010   CO2 27 10/19/2009   TSH 3.39 05/31/2010       Assessment & Plan:  See problem list. Medications and labs reviewed today.

## 2010-09-27 NOTE — Patient Instructions (Signed)
It was good to see you today. Medications reviewed, no changes at this time. Continue to monitor your blood pressure at home and let us know if it remains elevated Test(s) ordered today. Your results will be called to you after review (48-72hours after test completion). If any changes need to be made, you will be notified at that time. we'll make referral to Dr. Christella Hartigan as requested. Our office will contact you regarding appointment(s) once made. Please schedule followup in 4 months, call sooner if problems.

## 2010-09-27 NOTE — Assessment & Plan Note (Signed)
Lab Results  Component Value Date   TSH 3.39 05/31/2010   Will check tsh now - The current medical regimen is effective;  continue present plan and medications.

## 2010-09-27 NOTE — Assessment & Plan Note (Signed)
Has followed in [place] - [place], then [place] but would like to transfer to [facility] - requests Christella Hartigan at suggestion of Dr. Jason Fila Referral done today -  The current medical regimen is effective (MP6, prev pred);  continue present plan and medications.

## 2010-10-04 ENCOUNTER — Encounter: Payer: Self-pay | Admitting: Gastroenterology

## 2010-10-18 ENCOUNTER — Other Ambulatory Visit: Payer: Self-pay | Admitting: Internal Medicine

## 2010-11-10 ENCOUNTER — Other Ambulatory Visit: Payer: Self-pay | Admitting: Internal Medicine

## 2010-11-11 ENCOUNTER — Encounter: Payer: Self-pay | Admitting: Gastroenterology

## 2010-11-11 ENCOUNTER — Ambulatory Visit (INDEPENDENT_AMBULATORY_CARE_PROVIDER_SITE_OTHER): Payer: Medicare Other | Admitting: Gastroenterology

## 2010-11-11 VITALS — BP 182/56 | HR 60 | Ht 66.0 in | Wt 109.0 lb

## 2010-11-11 DIAGNOSIS — K754 Autoimmune hepatitis: Secondary | ICD-10-CM

## 2010-11-11 MED ORDER — MERCAPTOPURINE 50 MG PO TABS: 25.0000 mg | ORAL_TABLET | Freq: Every day | ORAL | Status: DC

## 2010-11-11 NOTE — Patient Instructions (Signed)
New script called in for your 6mp. Continue to take it as usual. You will have labs checked in one month in the basement lab (cbc, cmet).  Will check your labs every 3-4 months. Return to see Dr. Christella Hartigan in 1 year, sooner if any problems. A copy of this information will be made available to Dr. Felicity Coyer.

## 2010-11-11 NOTE — Progress Notes (Signed)
HPI: This is a  very pleasant 77 year old woman who I am meeting for the first time today.  She was diagnosed with AIH 5 years, she presented with jaundice, nausea.  Eventually had liver biopsy.  She was on prednisone for about 2 years.  She was tried on another med that made her nausea then 6mp.  She has been on 6mp for 2-3 years solo therapy.  LFTs in August were normal.  She was never told she had cirrhosis.  No FH of liver disease.  She needed to be on prednisone for a brief time once since starting 6mp (dose of 6mp 25mg  a day).    Review of systems: Pertinent positive and negative review of systems were noted in the above HPI section.  All other review of systems was otherwise negative.   Past Medical History  Diagnosis Date  . Unspecified vitamin D deficiency   . Headache 02/15/2010  . ALLERGIC RHINITIS CAUSE UNSPECIFIED   . ANXIETY   . DEPRESSION, MAJOR, MODERATE   . HYPERTENSION, BENIGN ESSENTIAL   . HYPOTHYROIDISM   . OSTEOPOROSIS   . Autoimmune hepatitis     on MP6, prev pred    Past Surgical History  Procedure Date  . Cataract extraction 2008    Left eye  . Cataract extraction 2009    Right eye  . Tonsillectomy 1945     reports that she has never smoked. She has never used smokeless tobacco. She reports that she does not drink alcohol or use illicit drugs.  family history includes Heart disease in her father; Hypertension in her mother; and Stroke in her mother and sister.    Current Medications, Allergies were all reviewed with the patient via [facility] electronic medical record system.    Physical Exam: BP 182/56  Pulse 60  Ht 5\' 6"  (1.676 m)  Wt 109 lb (49.442 kg)  BMI 17.59 kg/m2 Constitutional: generally well-appearing Psychiatric: alert and oriented x3 Eyes: extraocular movements intact Mouth: oral pharynx moist, no lesions Neck: supple no lymphadenopathy Cardiovascular: heart regular rate and rhythm Lungs: clear to auscultation  bilaterally Abdomen: soft, nontender, nondistended, no obvious ascites, no peritoneal signs, normal bowel sounds Extremities: no lower extremity edema bilaterally Skin: no lesions on visible extremities    Assessment and plan: 77 y.o. female with autoimmune hepatitis, under good control on 6-MP  She is on very low-dose 6-MP and her liver tests have been normal for quite a while. Her last liver tests were about 2 months ago. She will get a repeat set in one month's time. She will continue her 6-MP at the current dose. She will return to see me in about one year and sooner if needed.

## 2010-11-28 ENCOUNTER — Other Ambulatory Visit: Payer: Self-pay | Admitting: Internal Medicine

## 2010-12-12 ENCOUNTER — Ambulatory Visit (INDEPENDENT_AMBULATORY_CARE_PROVIDER_SITE_OTHER): Payer: Medicare Other | Admitting: *Deleted

## 2010-12-12 ENCOUNTER — Other Ambulatory Visit (INDEPENDENT_AMBULATORY_CARE_PROVIDER_SITE_OTHER): Payer: Medicare Other

## 2010-12-12 DIAGNOSIS — K754 Autoimmune hepatitis: Secondary | ICD-10-CM

## 2010-12-12 DIAGNOSIS — Z23 Encounter for immunization: Secondary | ICD-10-CM

## 2010-12-12 LAB — COMPREHENSIVE METABOLIC PANEL
ALT: 16 U/L (ref 0–35)
Alkaline Phosphatase: 41 U/L (ref 39–117)
Creatinine, Ser: 0.8 mg/dL (ref 0.4–1.2)
Sodium: 139 mEq/L (ref 135–145)
Total Bilirubin: 0.8 mg/dL (ref 0.3–1.2)
Total Protein: 7.3 g/dL (ref 6.0–8.3)

## 2010-12-12 LAB — CBC WITH DIFFERENTIAL/PLATELET
Basophils Absolute: 0 10*3/uL (ref 0.0–0.1)
Eosinophils Absolute: 0.1 10*3/uL (ref 0.0–0.7)
HCT: 35 % — ABNORMAL LOW (ref 36.0–46.0)
Lymphs Abs: 0.8 10*3/uL (ref 0.7–4.0)
MCHC: 34.3 g/dL (ref 30.0–36.0)
MCV: 106.6 fl — ABNORMAL HIGH (ref 78.0–100.0)
Monocytes Absolute: 0.6 10*3/uL (ref 0.1–1.0)
Platelets: 153 10*3/uL (ref 150.0–400.0)
RDW: 13.3 % (ref 11.5–14.6)

## 2010-12-13 ENCOUNTER — Other Ambulatory Visit: Payer: Self-pay | Admitting: Gastroenterology

## 2010-12-13 DIAGNOSIS — K754 Autoimmune hepatitis: Secondary | ICD-10-CM

## 2010-12-16 ENCOUNTER — Telehealth: Payer: Self-pay

## 2010-12-16 NOTE — Telephone Encounter (Signed)
Message copied by Donata Duff on Mon Dec 16, 2010  8:33 AM ------      Message from: Donata Duff      Created: Mon Nov 11, 2010 10:47 AM       Labs in Nov

## 2010-12-16 NOTE — Telephone Encounter (Signed)
Left message on machine to call back  

## 2010-12-16 NOTE — Telephone Encounter (Signed)
Pt labs completed on 12/12/10

## 2011-01-16 ENCOUNTER — Ambulatory Visit (INDEPENDENT_AMBULATORY_CARE_PROVIDER_SITE_OTHER): Payer: Medicare Other | Admitting: Endocrinology

## 2011-01-16 ENCOUNTER — Encounter: Payer: Self-pay | Admitting: Endocrinology

## 2011-01-16 DIAGNOSIS — I10 Essential (primary) hypertension: Secondary | ICD-10-CM

## 2011-01-16 MED ORDER — HYDROCHLOROTHIAZIDE 12.5 MG PO CAPS: 12.5000 mg | ORAL_CAPSULE | Freq: Every day | ORAL | Status: DC

## 2011-01-16 NOTE — Patient Instructions (Signed)
i have sent a prescription to your pharmacy, for an additional blood pressure pill. Please see dr Felicity Coyer as scheduled.

## 2011-01-16 NOTE — Progress Notes (Signed)
Subjective:    Patient ID: Marisa Smith, female    DOB: 1925-01-01, 77 y.o.   MRN: [id number]  HPI Pt states few years of htn.  She was last seen by dr Felicity Coyer 3 mos ago, when bp was only slightly high.  She was started on seroquel a few weeks ago, and bp has been elevated since then.   Denies loc and edema.   Past Medical History  Diagnosis Date  . Unspecified vitamin D deficiency   . Headache 02/15/2010  . ALLERGIC RHINITIS CAUSE UNSPECIFIED   . ANXIETY   . DEPRESSION, MAJOR, MODERATE   . HYPERTENSION, BENIGN ESSENTIAL   . HYPOTHYROIDISM   . OSTEOPOROSIS   . Autoimmune hepatitis     on MP6, prev pred    Past Surgical History  Procedure Date  . Cataract extraction 2008    Left eye  . Cataract extraction 2009    Right eye  . Tonsillectomy 1945    History   Social History  . Marital Status: Married    Spouse Name: N/A    Number of Children: 1  . Years of Education: N/A   Occupational History  .     Social History Main Topics  . Smoking status: Never Smoker   . Smokeless tobacco: Never Used   Comment: Married, Retired- daughter is Okey Regal [employer] responsible for majority of supervison for parents  . Alcohol Use: No  . Drug Use: No  . Sexually Active: Not on file   Other Topics Concern  . Not on file   Social History Narrative  . No narrative on file    Current Outpatient Prescriptions on File Prior to Visit  Medication Sig Dispense Refill  . alendronate (FOSAMAX) 70 MG tablet TAKE 1 TABLET BY MOUTH ONCE A WEEK  4 tablet  5  . aspirin EC 81 MG EC tablet Take 81 mg by mouth daily as needed.       [name] Kitchen atenolol (TENORMIN) 50 MG tablet TAKE 1 & 1/2 TABLETS BY MOUTH EVERY DAY  45 tablet  5  . calcium gluconate 500 MG tablet Take 500 mg by mouth daily.        . Cholecalciferol (VITAMIN D3) 2000 UNITS TABS Take 2,000 Units by mouth daily.        [name] Kitchen levothyroxine (SYNTHROID, LEVOTHROID) 50 MCG tablet TAKE 1 TABLET BY MOUTH DAILY  30 tablet  5  . lisinopril  (PRINIVIL,ZESTRIL) 40 MG tablet TAKE 1 TABLET BY MOUTH ONCE DAILY  30 tablet  5  . LORazepam (ATIVAN) 0.5 MG tablet Take 0.5 mg by mouth daily.       . mercaptopurine (PURINETHOL) 50 MG tablet Take 0.5 tablets (25 mg total) by mouth daily. Take 1/2 by mouth daily  20 tablet  11  . mirtazapine (REMERON) 45 MG tablet Take 45 mg by mouth at bedtime.        [name] Kitchen venlafaxine (EFFEXOR) 37.5 MG tablet Take 37.5 mg by mouth daily.         Allergies  Allergen Reactions  . Amlodipine Besylate     REACTION: itching  . Azathioprine     REACTION: nausea  . Penicillins     REACTION: yeast infection  . Sertraline Hcl     REACTION: rash  . Sulfonamide Derivatives     REACTION: swelling and itching   Family History  Problem Relation Age of Onset  . Hypertension Mother   . Stroke Mother   . Heart disease Father   .  Stroke Sister    BP 162/72  Pulse 54  Temp(Src) 98.2 F (36.8 C) (Oral)  Ht 5\' 6"  (1.676 m)  Wt 108 lb 8 oz (49.215 kg)  BMI 17.51 kg/m2  SpO2 95%  Review of Systems Denies sob chest pain    Objective:   Physical Exam VITAL SIGNS:  See vs page GENERAL: no distress Ext: no edema        Assessment & Plan:  HTN, needs increased rx

## 2011-01-30 ENCOUNTER — Encounter: Payer: Self-pay | Admitting: Internal Medicine

## 2011-01-30 ENCOUNTER — Ambulatory Visit (INDEPENDENT_AMBULATORY_CARE_PROVIDER_SITE_OTHER): Payer: Medicare Other | Admitting: Internal Medicine

## 2011-01-30 DIAGNOSIS — E039 Hypothyroidism, unspecified: Secondary | ICD-10-CM

## 2011-01-30 DIAGNOSIS — F321 Major depressive disorder, single episode, moderate: Secondary | ICD-10-CM

## 2011-01-30 DIAGNOSIS — H8110 Benign paroxysmal vertigo, unspecified ear: Secondary | ICD-10-CM

## 2011-01-30 DIAGNOSIS — I10 Essential (primary) hypertension: Secondary | ICD-10-CM

## 2011-01-30 MED ORDER — MECLIZINE HCL 12.5 MG PO TABS: 12.5000 mg | ORAL_TABLET | Freq: Three times a day (TID) | ORAL | Status: AC | PRN

## 2011-01-30 NOTE — Assessment & Plan Note (Signed)
Labile but overall controlled - Will monitor and defer changes at this time Use hctz prn as rx'd   BP Readings from Last 3 Encounters:  01/30/11 154/82  01/16/11 162/72  11/11/10 182/56

## 2011-01-30 NOTE — Assessment & Plan Note (Signed)
Severe symptoms - wax/wane flares -current med combination reviewed Continue to work with psyc (plovsky) as ongoing, continue efforts at behav therapy The current medical regimen is effective;  continue present plan and medications.

## 2011-01-30 NOTE — Progress Notes (Signed)
  Subjective:    Patient ID: Marisa Smith, female    DOB: 20-Apr-1924, 77 y.o.   MRN: [id number]  HPI  here for follow up - reviewed chronic med issues:  autoimmune hep - follows with GI specialist in [place] for same-  remote hx pred use, now on MP6 - reports compliance with ongoing medical treatment and no changes in medication dose or frequency. denies adverse side effects related to current therapy.   hypothyroid - reports compliance with ongoing medical treatment and no changes in medication dose or frequency. denies adverse side effects related to current therapy.   HTN - uncontrolled/labile - reports compliance with ongoing medical treatment and no changes in medication dose or frequency. denies adverse side effects related to current therapy. describes prior intol of amlodipine  depression/anxiety - severe symptoms hx - remains labile but improved at this time- long personal hx and FH same - follows with psyc for meds and pyschology appt for counseling - hx rash on zoloft, sleep better with ativan and restoril. current doses/meds reviewed as well as poor tol of wellbutrin trial; on/off Abilify trials - trial Seroquel November 2012 >caused elevation in blood pressure  osteoporosis - reports compliance with ongoing medical treatment and no changes in medication dose or frequency. denies adverse side effects related to current therapy.   Past Medical History  Diagnosis Date  . Unspecified vitamin D deficiency   . Headache 02/15/2010  . ALLERGIC RHINITIS CAUSE UNSPECIFIED   . ANXIETY   . DEPRESSION, MAJOR, MODERATE   . HYPERTENSION, BENIGN ESSENTIAL   . HYPOTHYROIDISM   . OSTEOPOROSIS   . Autoimmune hepatitis     on MP6, prev pred     Review of Systems  Constitutional: Positive for fatigue. Negative for unexpected weight change.  Respiratory: Negative for shortness of breath.   Cardiovascular: Negative for chest pain.       Objective:   Physical Exam BP 154/82  Pulse 50   Temp(Src) 97.8 F (36.6 C) (Oral)  Wt 106 lb 12.8 oz (48.444 kg)  SpO2 98% Wt Readings from Last 3 Encounters:  01/30/11 106 lb 12.8 oz (48.444 kg)  01/16/11 108 lb 8 oz (49.215 kg)  11/11/10 109 lb (49.442 kg)   Constitutional: She appears well-developed and well-nourished. NAD HENT: TMs clear without redness or effusion Cardiovascular: Normal rate, regular rhythm and normal heart sounds.  no BLE edema Pulmonary/Chest: Effort normal and breath sounds normal. No respiratory distress. She has no wheezes.  Neurological: She is alert and oriented to person, place, and time. No cranial nerve deficit. Coordination normal.  Psychiatric: She has anxious mood and affect. Her behavior is normal. Thought content normal. less depressed than prior visits    Lab Results  Component Value Date   WBC 4.4* 12/12/2010   HGB 12.0 12/12/2010   HCT 35.0* 12/12/2010   PLT 153.0 12/12/2010   ALT 16 12/12/2010   AST 26 12/12/2010   NA 139 12/12/2010   K 4.2 12/12/2010   CL 102 12/12/2010   CREATININE 0.8 12/12/2010   BUN 22 12/12/2010   CO2 30 12/12/2010   TSH 2.44 09/27/2010       Assessment & Plan:  See problem list. Medications and labs reviewed today.  BPPV - episodic events in last 48h - neuro and ENT exam benign - ?anxiety manifestation - use prn meclizine - erx done and reassurance p[rovided

## 2011-01-30 NOTE — Assessment & Plan Note (Signed)
Lab Results  Component Value Date   TSH 2.44 09/27/2010   the current medical regimen is effective;  continue present plan and medications.

## 2011-01-30 NOTE — Patient Instructions (Signed)
It was good to see you today. Use hydrochlorothiazide capsule only as needed for blood pressure greater than 160 Use meclizine if needed for dizziness symptoms Other medications reviewed and no other changes today Your prescription(s) have been submitted to your pharmacy. Please take as directed and contact our office if you believe you are having problem(s) with the medication(s). Continue to work with her psychiatrist on behavioral therapies and counseling as we discussed, no medication changes recommended from me Please schedule followup in 3-4 months for blood pressure check, call sooner if problems.

## 2011-03-11 ENCOUNTER — Other Ambulatory Visit: Payer: Self-pay | Admitting: Internal Medicine

## 2011-03-11 ENCOUNTER — Ambulatory Visit (INDEPENDENT_AMBULATORY_CARE_PROVIDER_SITE_OTHER): Payer: Medicare Other | Admitting: Internal Medicine

## 2011-03-11 ENCOUNTER — Encounter: Payer: Self-pay | Admitting: Internal Medicine

## 2011-03-11 VITALS — BP 138/80 | HR 51 | Temp 96.9°F

## 2011-03-11 DIAGNOSIS — H6120 Impacted cerumen, unspecified ear: Secondary | ICD-10-CM

## 2011-03-11 NOTE — Patient Instructions (Signed)
It was good to see you today. Your ears have been irrigated of wax today -let us know if continued hearing problems persist for referral to audiologist and hearing testing

## 2011-03-11 NOTE — Progress Notes (Signed)
  Subjective:    Patient ID: Marisa Smith, female    DOB: [birth year], 78 y.o.   MRN: [id number]  HPI complains of ear "clogged feeling" Onset 2 weeks ago - unchanged No ear pain, no drainage, no fluid or popping No trauma No headache, sinus pressure, recent URI or allergies No med changes  Past Medical History  Diagnosis Date  . Unspecified vitamin D deficiency   . Headache 02/15/2010  . ALLERGIC RHINITIS CAUSE UNSPECIFIED   . ANXIETY   . DEPRESSION, MAJOR, MODERATE   . HYPERTENSION, BENIGN ESSENTIAL   . HYPOTHYROIDISM   . OSTEOPOROSIS   . Autoimmune hepatitis     on MP6, prev pred    Review of Systems  Constitutional: Negative for fever and fatigue.  HENT: Negative for ear pain, neck pain, tinnitus and ear discharge.   Eyes: Negative for pain and visual disturbance.  Neurological: Negative for dizziness and headaches.       Objective:   Physical Exam BP 138/80  Pulse 51  Temp(Src) 96.9 F (36.1 C) (Oral)  SpO2 97% Wt Readings from Last 3 Encounters:  01/30/11 106 lb 12.8 oz (48.444 kg)  01/16/11 108 lb 8 oz (49.215 kg)  11/11/10 109 lb (49.442 kg)   Constitutional: She appears well-developed and well-nourished. No distress.  HENT: Head: Normocephalic and atraumatic. Ears: Initially, both sides occluded with cerumen - after irrigation, B TMs ok, no erythema or effusion; Nose: Nose normal.  Mouth/Throat: Oropharynx is clear and moist. No oropharyngeal exudate.  Eyes: Conjunctivae and EOM are normal. Pupils are equal, round, and reactive to light. No scleral icterus.  Neck: Normal range of motion. Neck supple. No JVD present. No thyromegaly present.  Cardiovascular: Normal rate, regular rhythm and normal heart sounds.  No murmur heard. No BLE edema. Pulmonary/Chest: Effort normal and breath sounds normal. No respiratory distress. She has no wheezes.  Psychiatric: She has a normal mood and affect. Her behavior is normal. Judgment and thought content normal.   Lab  Results  Component Value Date   WBC 4.4* 12/12/2010   HGB 12.0 12/12/2010   HCT 35.0* 12/12/2010   PLT 153.0 12/12/2010   GLUCOSE 93 12/12/2010   ALT 16 12/12/2010   AST 26 12/12/2010   NA 139 12/12/2010   K 4.2 12/12/2010   CL 102 12/12/2010   CREATININE 0.8 12/12/2010   BUN 22 12/12/2010   CO2 30 12/12/2010   TSH 2.44 09/27/2010   Procedure: wax removal, bilateral Reason: wax impaction, bilateral Risks and benefits of procedure discussed with the patient who agrees to proceed. Ear(s) irrigated with warm water. Large amount of wax removed. Instrumentation with metal ear loop was performed to accomplish wax removal. the patient tolerated procedure well.     Assessment & Plan:  Cerumen impaction - irrigation today with improved symptoms

## 2011-03-17 ENCOUNTER — Telehealth: Payer: Self-pay

## 2011-03-17 NOTE — Telephone Encounter (Signed)
Message copied by Donata Duff on Mon Mar 17, 2011 10:45 AM ------      Message from: Donata Duff      Created: Fri Dec 13, 2010  1:59 PM       Pt to get labs

## 2011-03-18 ENCOUNTER — Other Ambulatory Visit: Payer: Self-pay

## 2011-03-18 ENCOUNTER — Other Ambulatory Visit (INDEPENDENT_AMBULATORY_CARE_PROVIDER_SITE_OTHER): Payer: Medicare Other

## 2011-03-18 DIAGNOSIS — K754 Autoimmune hepatitis: Secondary | ICD-10-CM

## 2011-03-18 LAB — CBC WITH DIFFERENTIAL/PLATELET
Eosinophils Relative: 2.6 % (ref 0.0–5.0)
HCT: 36 % (ref 36.0–46.0)
Hemoglobin: 11.9 g/dL — ABNORMAL LOW (ref 12.0–15.0)
Lymphs Abs: 1 10*3/uL (ref 0.7–4.0)
MCV: 107.1 fl — ABNORMAL HIGH (ref 78.0–100.0)
Monocytes Relative: 10.4 % (ref 3.0–12.0)
Neutro Abs: 3.6 10*3/uL (ref 1.4–7.7)
RDW: 13.2 % (ref 11.5–14.6)
WBC: 5.2 10*3/uL (ref 4.5–10.5)

## 2011-03-18 LAB — COMPREHENSIVE METABOLIC PANEL
Albumin: 3.9 g/dL (ref 3.5–5.2)
Alkaline Phosphatase: 37 U/L — ABNORMAL LOW (ref 39–117)
BUN: 25 mg/dL — ABNORMAL HIGH (ref 6–23)
CO2: 31 mEq/L (ref 19–32)
Calcium: 9.7 mg/dL (ref 8.4–10.5)
Chloride: 99 mEq/L (ref 96–112)
GFR: 72.52 mL/min (ref >60.00)
Glucose, Bld: 102 mg/dL — ABNORMAL HIGH (ref 70–99)
Potassium: 4.6 mEq/L (ref 3.5–5.1)
Sodium: 138 mEq/L (ref 135–145)
Total Protein: 6.9 g/dL (ref 6.0–8.3)

## 2011-03-18 NOTE — Telephone Encounter (Signed)
Pt reminded to have her labs

## 2011-03-19 ENCOUNTER — Other Ambulatory Visit: Payer: Self-pay

## 2011-03-19 DIAGNOSIS — K754 Autoimmune hepatitis: Secondary | ICD-10-CM

## 2011-03-27 ENCOUNTER — Telehealth: Payer: Self-pay | Admitting: Gastroenterology

## 2011-03-27 NOTE — Telephone Encounter (Signed)
Pt is calling to say that the pharmacy has told her that the 6 mp is not available because of a Forensic scientist.  She took her last one today.  What should she do?  Please advise.[name] Kitchen

## 2011-03-27 NOTE — Telephone Encounter (Signed)
azathiaprine will work just as well.    Please call her in azathiaprine 50mg  pills, take one pill, once daily.  Disp 30 days.  3 refills.  She needs cbc, cmet in 3 weeks.  Thanks

## 2011-03-28 ENCOUNTER — Other Ambulatory Visit: Payer: Self-pay

## 2011-03-28 DIAGNOSIS — K754 Autoimmune hepatitis: Secondary | ICD-10-CM

## 2011-03-28 MED ORDER — MERCAPTOPURINE 50 MG PO TABS: 25.0000 mg | ORAL_TABLET | Freq: Every day | ORAL | Status: DC

## 2011-03-28 NOTE — Telephone Encounter (Signed)
Pt is allergic to Azathioprine, I will call other pharmacy's and see if they have any 6 mp available.

## 2011-03-28 NOTE — Progress Notes (Signed)
Error

## 2011-03-28 NOTE — Telephone Encounter (Signed)
[facility] has the prescription and has been sent pt notified

## 2011-04-16 ENCOUNTER — Other Ambulatory Visit: Payer: Self-pay | Admitting: Internal Medicine

## 2011-05-05 ENCOUNTER — Other Ambulatory Visit: Payer: Self-pay | Admitting: Gastroenterology

## 2011-05-05 MED ORDER — MERCAPTOPURINE 50 MG PO TABS: 25.0000 mg | ORAL_TABLET | Freq: Every day | ORAL | Status: DC

## 2011-05-05 NOTE — Telephone Encounter (Signed)
Pt aware that the med has been sent to the pharmacy and labs are due.  She will be in [place] week.

## 2011-05-08 ENCOUNTER — Other Ambulatory Visit: Payer: Self-pay | Admitting: Internal Medicine

## 2011-05-09 ENCOUNTER — Telehealth: Payer: Self-pay | Admitting: *Deleted

## 2011-05-09 DIAGNOSIS — I10 Essential (primary) hypertension: Secondary | ICD-10-CM

## 2011-05-09 NOTE — Telephone Encounter (Signed)
I have made refer to cardiology as requested by pt's dtr for asst with BP control.

## 2011-05-09 NOTE — Telephone Encounter (Signed)
Pt's daughter called on behalf of pt and wanted to be transferred to MD's VM-did not want to speak to me or triage-pt's daughter transferred to VM-FYI

## 2011-05-12 ENCOUNTER — Other Ambulatory Visit: Payer: Self-pay

## 2011-05-12 ENCOUNTER — Other Ambulatory Visit (INDEPENDENT_AMBULATORY_CARE_PROVIDER_SITE_OTHER): Payer: Medicare Other

## 2011-05-12 DIAGNOSIS — K754 Autoimmune hepatitis: Secondary | ICD-10-CM

## 2011-05-12 LAB — CBC WITH DIFFERENTIAL/PLATELET
Basophils Absolute: 0 10*3/uL (ref 0.0–0.1)
Eosinophils Absolute: 0.1 10*3/uL (ref 0.0–0.7)
HCT: 33.7 % — ABNORMAL LOW (ref 36.0–46.0)
Hemoglobin: 11.4 g/dL — ABNORMAL LOW (ref 12.0–15.0)
Lymphs Abs: 0.8 10*3/uL (ref 0.7–4.0)
MCHC: 33.9 g/dL (ref 30.0–36.0)
Monocytes Absolute: 0.5 10*3/uL (ref 0.1–1.0)
Neutro Abs: 3.2 10*3/uL (ref 1.4–7.7)
Platelets: 156 10*3/uL (ref 150.0–400.0)
RDW: 13.4 % (ref 11.5–14.6)

## 2011-05-12 LAB — COMPREHENSIVE METABOLIC PANEL
ALT: 16 U/L (ref 0–35)
AST: 25 U/L (ref 0–37)
CO2: 28 mEq/L (ref 19–32)
Chloride: 98 mEq/L (ref 96–112)
Creatinine, Ser: 0.8 mg/dL (ref 0.4–1.2)
GFR: 72.49 mL/min (ref >60.00)
Sodium: 134 mEq/L — ABNORMAL LOW (ref 135–145)
Total Bilirubin: 0.5 mg/dL (ref 0.3–1.2)
Total Protein: 6.6 g/dL (ref 6.0–8.3)

## 2011-05-13 ENCOUNTER — Other Ambulatory Visit: Payer: Self-pay

## 2011-05-13 DIAGNOSIS — K754 Autoimmune hepatitis: Secondary | ICD-10-CM

## 2011-05-16 ENCOUNTER — Telehealth: Payer: Self-pay

## 2011-05-16 NOTE — Telephone Encounter (Signed)
Message copied by Donata Duff on Fri May 16, 2011  8:01 AM ------      Message from: Donata Duff      Created: Mon May 05, 2011  9:29 AM       Pt to get labs

## 2011-05-16 NOTE — Telephone Encounter (Signed)
Pt labs have been done and pt has been notified of the results

## 2011-05-26 ENCOUNTER — Other Ambulatory Visit: Payer: Self-pay | Admitting: Internal Medicine

## 2011-05-30 ENCOUNTER — Ambulatory Visit (INDEPENDENT_AMBULATORY_CARE_PROVIDER_SITE_OTHER): Payer: Medicare Other | Admitting: Internal Medicine

## 2011-05-30 ENCOUNTER — Encounter: Payer: Self-pay | Admitting: Internal Medicine

## 2011-05-30 ENCOUNTER — Other Ambulatory Visit (INDEPENDENT_AMBULATORY_CARE_PROVIDER_SITE_OTHER): Payer: Medicare Other

## 2011-05-30 VITALS — BP 172/72 | HR 48 | Temp 98.2°F | Resp 14 | Wt 109.5 lb

## 2011-05-30 DIAGNOSIS — E039 Hypothyroidism, unspecified: Secondary | ICD-10-CM

## 2011-05-30 DIAGNOSIS — I10 Essential (primary) hypertension: Secondary | ICD-10-CM

## 2011-05-30 DIAGNOSIS — F321 Major depressive disorder, single episode, moderate: Secondary | ICD-10-CM

## 2011-05-30 LAB — TSH: TSH: 2.7 u[IU]/mL (ref 0.35–5.50)

## 2011-05-30 NOTE — Patient Instructions (Signed)
It was good to see you today. Medications reviewed, no changes at this time. -  Please use your lorazepam as needed for nerves AND to help control blood pressure Please let me know if you will go to see heart specialist to help with your blood pressure - i will not make a referral until you tell me "ok" Also, let me know if you are willing to do the "24 hour urine test" we talked about  Test(s) ordered today. Your results will be called to you after review (48-72hours after test completion). If any changes need to be made, you will be notified at that time. Conitnue working with Dr Donell Beers on your medications for depression and anxiety Please schedule followup in 3-4 months, call sooner if problems.

## 2011-05-30 NOTE — Assessment & Plan Note (Signed)
Severe symptoms with anxety overlap wax/wane flares -current med combination reviewed and updated Advised to consider the lorazepam as "BP" control medication - pt agrees to do so and use prn Continue to work with psyc (plovsky) as ongoing, continue efforts at behav therapy The current medical regimen is effective;  continue present plan and medications. 

## 2011-05-30 NOTE — Assessment & Plan Note (Signed)
Labile but overall controlled - Have offered to make referral to cards and to check 24h urine to look for pheo - pt adamantly declines these Will monitor and defer changes at this time Use hctz prn as rx'd   BP Readings from Last 3 Encounters:  05/30/11 172/72  03/11/11 138/80  01/30/11 154/82

## 2011-05-30 NOTE — Progress Notes (Signed)
  Subjective:    Patient ID: Marisa Smith, female    DOB: June 01, 1924, 78 y.o.   MRN: [id number]  HPI  Here for follow up  see CC for pt concerns  Past Medical History  Diagnosis Date  . Unspecified vitamin D deficiency   . Headache 02/15/2010  . ALLERGIC RHINITIS CAUSE UNSPECIFIED   . ANXIETY   . DEPRESSION, MAJOR, MODERATE   . HYPERTENSION, BENIGN ESSENTIAL   . HYPOTHYROIDISM   . OSTEOPOROSIS   . Autoimmune hepatitis     on MP6, prev pred    Review of Systems  Constitutional: Negative for fever and fatigue.  HENT: Negative for ear pain, neck pain, tinnitus and ear discharge.   Eyes: Negative for pain and visual disturbance.  Neurological: Negative for dizziness and headaches.       Objective:   Physical Exam  BP 172/72  Pulse 48  Temp(Src) 98.2 F (36.8 C) (Oral)  Resp 14  Wt 109 lb 8 oz (49.669 kg)  SpO2 99% Wt Readings from Last 3 Encounters:  05/30/11 109 lb 8 oz (49.669 kg)  01/30/11 106 lb 12.8 oz (48.444 kg)  01/16/11 108 lb 8 oz (49.215 kg)   Constitutional: She appears well-developed and well-nourished. No distress.  HENT: Head: Normocephalic and atraumatic. Ears: B TMs ok, no erythema or effusion; Nose: Nose normal. Mouth/Throat: Oropharynx is clear and moist. No oropharyngeal exudate.  Eyes: Conjunctivae and EOM are normal. Pupils are equal, round, and reactive to light. No scleral icterus.  Neck: Normal range of motion. Neck supple. No JVD present. No thyromegaly present.  Cardiovascular: Normal rate, regular rhythm and normal heart sounds.  No murmur heard. No BLE edema. Pulmonary/Chest: Effort normal and breath sounds normal. No respiratory distress. She has no wheezes.  Psychiatric: She has a very anxious/depressed mood and affect. Her behavior is normal. Judgment and thought content normal.   Lab Results  Component Value Date   WBC 4.7 05/12/2011   HGB 11.4* 05/12/2011   HCT 33.7* 05/12/2011   PLT 156.0 05/12/2011   GLUCOSE 122* 05/12/2011   ALT 16  05/12/2011   AST 25 05/12/2011   NA 134* 05/12/2011   K 4.3 05/12/2011   CL 98 05/12/2011   CREATININE 0.8 05/12/2011   BUN 24* 05/12/2011   CO2 28 05/12/2011   TSH 2.44 09/27/2010       Assessment & Plan:

## 2011-05-30 NOTE — Assessment & Plan Note (Signed)
Lab Results  Component Value Date   TSH 2.44 09/27/2010   the current medical regimen is effective;  continue present plan and medications.  

## 2011-09-23 ENCOUNTER — Other Ambulatory Visit: Payer: Self-pay | Admitting: Internal Medicine

## 2011-10-03 ENCOUNTER — Encounter: Payer: Self-pay | Admitting: Internal Medicine

## 2011-10-03 ENCOUNTER — Other Ambulatory Visit (INDEPENDENT_AMBULATORY_CARE_PROVIDER_SITE_OTHER): Payer: Medicare Other

## 2011-10-03 ENCOUNTER — Ambulatory Visit (INDEPENDENT_AMBULATORY_CARE_PROVIDER_SITE_OTHER): Payer: Medicare Other | Admitting: Internal Medicine

## 2011-10-03 VITALS — BP 142/60 | HR 63 | Temp 98.4°F | Ht 66.0 in | Wt 107.4 lb

## 2011-10-03 DIAGNOSIS — F321 Major depressive disorder, single episode, moderate: Secondary | ICD-10-CM

## 2011-10-03 DIAGNOSIS — H6120 Impacted cerumen, unspecified ear: Secondary | ICD-10-CM

## 2011-10-03 DIAGNOSIS — E039 Hypothyroidism, unspecified: Secondary | ICD-10-CM

## 2011-10-03 DIAGNOSIS — K754 Autoimmune hepatitis: Secondary | ICD-10-CM

## 2011-10-03 LAB — LIPID PANEL
LDL Cholesterol: 79 mg/dL (ref 0–99)
Total CHOL/HDL Ratio: 2

## 2011-10-03 LAB — CBC WITH DIFFERENTIAL/PLATELET
Basophils Relative: 0.9 % (ref 0.0–3.0)
Eosinophils Absolute: 0.1 10*3/uL (ref 0.0–0.7)
HCT: 38.5 % (ref 36.0–46.0)
Lymphs Abs: 0.8 10*3/uL (ref 0.7–4.0)
MCHC: 33.3 g/dL (ref 30.0–36.0)
MCV: 106.6 fl — ABNORMAL HIGH (ref 78.0–100.0)
Monocytes Absolute: 0.6 10*3/uL (ref 0.1–1.0)
Neutrophils Relative %: 69.6 % (ref 43.0–77.0)
RBC: 3.61 Mil/uL — ABNORMAL LOW (ref 3.87–5.11)

## 2011-10-03 LAB — BASIC METABOLIC PANEL
BUN: 18 mg/dL (ref 6–23)
CO2: 30 mEq/L (ref 19–32)
Chloride: 99 mEq/L (ref 96–112)
Creatinine, Ser: 0.9 mg/dL (ref 0.4–1.2)
Potassium: 4.7 mEq/L (ref 3.5–5.1)

## 2011-10-03 LAB — HEPATIC FUNCTION PANEL
Albumin: 3.9 g/dL (ref 3.5–5.2)
Alkaline Phosphatase: 31 U/L — ABNORMAL LOW (ref 39–117)
Bilirubin, Direct: 0.1 mg/dL (ref 0.0–0.3)

## 2011-10-03 NOTE — Assessment & Plan Note (Signed)
Lab Results  Component Value Date   TSH 2.70 05/30/2011   the current medical regimen is effective;  continue present plan and medications.

## 2011-10-03 NOTE — Assessment & Plan Note (Signed)
Previously followed in [facility] [place], then [place] but transfered to [facility] fall 2012Christella Hartigan at suggestion of Dr. Jason Fila The current medical regimen is effective (MP6, prev pred);  continue present plan and medications.

## 2011-10-03 NOTE — Assessment & Plan Note (Signed)
Severe symptoms with anxety overlap wax/wane flares -current med combination reviewed and updated Advised to consider the lorazepam as "BP" control medication - pt agrees to do so and use prn Continue to work with psyc (plovsky) as ongoing, continue efforts at behav therapy The current medical regimen is effective;  continue present plan and medications. 

## 2011-10-03 NOTE — Patient Instructions (Signed)
It was good to see you today. Medications reviewed and updated, no changes at this time. Your ears have been irrigated of wax today -let us know if continued hearing problems persist for referral to audiologist and hearing testing Test(s) ordered today. Your results will be called to you after review (48-72hours after test completion). If any changes need to be made, you will be notified at that time. Please schedule followup in 6 months, call sooner if problems.

## 2011-10-03 NOTE — Progress Notes (Signed)
  Subjective:    Patient ID: Marisa Smith, female    DOB: [birth year], 78 y.o.   MRN: [id number]  HPI  Here for follow up  see CC for pt concerns   Past Medical History  Diagnosis Date  . Unspecified vitamin D deficiency   . Headache 02/15/2010  . ALLERGIC RHINITIS CAUSE UNSPECIFIED   . ANXIETY   . DEPRESSION, MAJOR, MODERATE   . HYPERTENSION, BENIGN ESSENTIAL   . HYPOTHYROIDISM   . OSTEOPOROSIS   . Autoimmune hepatitis     on MP6, prev pred    Review of Systems  Constitutional: Positive for fatigue. Negative for fever.  HENT: Negative for ear pain, neck pain, tinnitus and ear discharge.   Eyes: Negative for pain and visual disturbance.  Neurological: Negative for dizziness and headaches.       Objective:   Physical Exam  BP 142/60  Pulse 63  Temp 98.4 F (36.9 C) (Oral)  Ht 5\' 6"  (1.676 m)  Wt 107 lb 6.4 oz (48.716 kg)  BMI 17.33 kg/m2  SpO2 97% Wt Readings from Last 3 Encounters:  10/03/11 107 lb 6.4 oz (48.716 kg)  05/30/11 109 lb 8 oz (49.669 kg)  01/30/11 106 lb 12.8 oz (48.444 kg)   Constitutional: She appears well-developed and well-nourished. No distress.  HENT: Head: Normocephalic and atraumatic. Ears: cerumen impaction B, after irrigation B TMs ok, no erythema or effusion; Nose: Nose normal. Mouth/Throat: Oropharynx is clear and moist. No oropharyngeal exudate.  Eyes: Conjunctivae and EOM are normal. Pupils are equal, round, and reactive to light. No scleral icterus.  Neck: Normal range of motion. Neck supple. No JVD present. No thyromegaly present.  Cardiovascular: Normal rate, regular rhythm and normal heart sounds.  No murmur heard. No BLE edema. Pulmonary/Chest: Effort normal and breath sounds normal. No respiratory distress. She has no wheezes.  Psychiatric: She has a very anxious/depressed mood and affect. Her behavior is normal. Judgment and thought content normal.   Lab Results  Component Value Date   WBC 4.7 05/12/2011   HGB 11.4* 05/12/2011   HCT  33.7* 05/12/2011   PLT 156.0 05/12/2011   GLUCOSE 122* 05/12/2011   ALT 16 05/12/2011   AST 25 05/12/2011   NA 134* 05/12/2011   K 4.3 05/12/2011   CL 98 05/12/2011   CREATININE 0.8 05/12/2011   BUN 24* 05/12/2011   CO2 28 05/12/2011   TSH 2.70 05/30/2011   Procedure: wax removal Reason: wax impaction Risks and benefits of procedure discussed with the patient who agrees to proceed. Ear(s) irrigated with warm water. Large amount of wax removed. Instrumentation with metal ear loop was performed to accomplish wax removal. the patient tolerated procedure well.     Assessment & Plan:  Cerumen impaction - irrigation as above - improved  Also see problem list. Medications and labs reviewed today.

## 2011-10-13 ENCOUNTER — Other Ambulatory Visit: Payer: Self-pay | Admitting: *Deleted

## 2011-10-13 MED ORDER — ATENOLOL 50 MG PO TABS: 75.0000 mg | ORAL_TABLET | Freq: Every day | ORAL | Status: DC

## 2011-10-27 ENCOUNTER — Telehealth: Payer: Self-pay | Admitting: General Practice

## 2011-10-27 ENCOUNTER — Other Ambulatory Visit: Payer: Self-pay | Admitting: Internal Medicine

## 2011-10-27 DIAGNOSIS — K754 Autoimmune hepatitis: Secondary | ICD-10-CM

## 2011-10-27 DIAGNOSIS — E559 Vitamin D deficiency, unspecified: Secondary | ICD-10-CM

## 2011-10-27 NOTE — Telephone Encounter (Signed)
Received call from Dr. Donell Beers wanting to have  B12 and homocystein labs drawn. Orders placed and Dr. Donell Beers would like results faxed to his office [phone]

## 2011-10-27 NOTE — Telephone Encounter (Signed)
Noted and agree thanks

## 2011-10-29 ENCOUNTER — Other Ambulatory Visit (INDEPENDENT_AMBULATORY_CARE_PROVIDER_SITE_OTHER): Payer: Medicare Other

## 2011-10-29 DIAGNOSIS — E559 Vitamin D deficiency, unspecified: Secondary | ICD-10-CM

## 2011-10-29 DIAGNOSIS — K754 Autoimmune hepatitis: Secondary | ICD-10-CM

## 2011-10-29 DIAGNOSIS — Z79899 Other long term (current) drug therapy: Secondary | ICD-10-CM

## 2011-10-30 LAB — HOMOCYSTEINE: Homocysteine: 9.4 umol/L (ref 4.0–15.4)

## 2011-11-11 ENCOUNTER — Encounter: Payer: Self-pay | Admitting: Gastroenterology

## 2011-11-12 ENCOUNTER — Ambulatory Visit (INDEPENDENT_AMBULATORY_CARE_PROVIDER_SITE_OTHER): Payer: Medicare Other | Admitting: *Deleted

## 2011-11-12 DIAGNOSIS — Z23 Encounter for immunization: Secondary | ICD-10-CM

## 2011-11-13 ENCOUNTER — Other Ambulatory Visit: Payer: Self-pay | Admitting: Internal Medicine

## 2011-11-22 ENCOUNTER — Other Ambulatory Visit: Payer: Self-pay | Admitting: Internal Medicine

## 2011-11-23 ENCOUNTER — Observation Stay (HOSPITAL_COMMUNITY)
Admission: EM | Admit: 2011-11-23 | Discharge: 2011-11-25 | Disposition: A | Discharge status: Home / Self Care | Payer: Medicare Other | Attending: Internal Medicine | Admitting: Internal Medicine

## 2011-11-23 ENCOUNTER — Emergency Department (HOSPITAL_COMMUNITY): Payer: Medicare Other

## 2011-11-23 ENCOUNTER — Encounter (HOSPITAL_COMMUNITY): Payer: Self-pay | Admitting: *Deleted

## 2011-11-23 DIAGNOSIS — Z79899 Other long term (current) drug therapy: Secondary | ICD-10-CM | POA: Insufficient documentation

## 2011-11-23 DIAGNOSIS — I10 Essential (primary) hypertension: Secondary | ICD-10-CM | POA: Insufficient documentation

## 2011-11-23 DIAGNOSIS — E871 Hypo-osmolality and hyponatremia: Principal | ICD-10-CM | POA: Insufficient documentation

## 2011-11-23 DIAGNOSIS — E559 Vitamin D deficiency, unspecified: Secondary | ICD-10-CM | POA: Insufficient documentation

## 2011-11-23 DIAGNOSIS — R739 Hyperglycemia, unspecified: Secondary | ICD-10-CM

## 2011-11-23 DIAGNOSIS — E039 Hypothyroidism, unspecified: Secondary | ICD-10-CM | POA: Insufficient documentation

## 2011-11-23 DIAGNOSIS — R7309 Other abnormal glucose: Secondary | ICD-10-CM | POA: Insufficient documentation

## 2011-11-23 DIAGNOSIS — F411 Generalized anxiety disorder: Secondary | ICD-10-CM

## 2011-11-23 DIAGNOSIS — F321 Major depressive disorder, single episode, moderate: Secondary | ICD-10-CM

## 2011-11-23 DIAGNOSIS — M810 Age-related osteoporosis without current pathological fracture: Secondary | ICD-10-CM | POA: Insufficient documentation

## 2011-11-23 DIAGNOSIS — R42 Dizziness and giddiness: Secondary | ICD-10-CM | POA: Insufficient documentation

## 2011-11-23 DIAGNOSIS — R4701 Aphasia: Secondary | ICD-10-CM | POA: Diagnosis present

## 2011-11-23 DIAGNOSIS — Z7982 Long term (current) use of aspirin: Secondary | ICD-10-CM | POA: Insufficient documentation

## 2011-11-23 DIAGNOSIS — R51 Headache: Secondary | ICD-10-CM

## 2011-11-23 DIAGNOSIS — R259 Unspecified abnormal involuntary movements: Secondary | ICD-10-CM

## 2011-11-23 DIAGNOSIS — K754 Autoimmune hepatitis: Secondary | ICD-10-CM | POA: Insufficient documentation

## 2011-11-23 DIAGNOSIS — R471 Dysarthria and anarthria: Secondary | ICD-10-CM | POA: Insufficient documentation

## 2011-11-23 LAB — CBC WITH DIFFERENTIAL/PLATELET
Basophils Relative: 0 % (ref 0–1)
Eosinophils Absolute: 0.1 10*3/uL (ref 0.0–0.7)
Hemoglobin: 13.2 g/dL (ref 12.0–15.0)
MCH: 35.4 pg — ABNORMAL HIGH (ref 26.0–34.0)
MCHC: 35.7 g/dL (ref 30.0–36.0)
Monocytes Relative: 10 % (ref 3–12)
Neutro Abs: 3.7 10*3/uL (ref 1.7–7.7)
Neutrophils Relative %: 66 % (ref 43–77)
Platelets: 220 10*3/uL (ref 150–400)
RBC: 3.73 MIL/uL — ABNORMAL LOW (ref 3.87–5.11)

## 2011-11-23 LAB — COMPREHENSIVE METABOLIC PANEL
ALT: 15 U/L (ref 0–35)
AST: 26 U/L (ref 0–37)
Albumin: 3.9 g/dL (ref 3.5–5.2)
Alkaline Phosphatase: 51 U/L (ref 39–117)
Chloride: 85 mEq/L — ABNORMAL LOW (ref 96–112)
Potassium: 3.8 mEq/L (ref 3.5–5.1)
Sodium: 121 mEq/L — ABNORMAL LOW (ref 135–145)
Total Bilirubin: 0.6 mg/dL (ref 0.3–1.2)
Total Protein: 7.6 g/dL (ref 6.0–8.3)

## 2011-11-23 NOTE — ED Notes (Signed)
Patient transported to CT 

## 2011-11-23 NOTE — ED Notes (Addendum)
Pt states she was asleep and woke up dizzy. Pt went from sitting in bed to standing when dizziness happened.  Pt states she also had high BP when she woke up and took her "water" pill as she was told to when her BP elevates. Pt took all home medications this morning. Pt no longer feels dizzy, pt has equal grips and strength, able to follow commands and is alert and oriented x 4. Pt denies HA, CP, SOB, pt daughter in triage states that she has had episodes like this in the past and has had MRI with no previous strokes found.

## 2011-11-23 NOTE — ED Notes (Signed)
Pt daughter states that her mother gets "pressure spikes" regardless of taking her medication as prescribed. States she feels dizzy during episodes. Pt denies losing consciousness during episodes.

## 2011-11-23 NOTE — ED Provider Notes (Signed)
History     CSN: [id number]  Arrival date & time 11/23/11  2121   First MD Initiated Contact with Patient 11/23/11 2258      Chief Complaint  Patient presents with  . Hypertension    (Consider location/radiation/quality/duration/timing/severity/associated sxs/prior treatment) HPI 78 year old female presents to the emergency department from home with report of one hour of "scrambled speech", generalized dizziness described as both the room spinning and feeling like she was going to pass out, and elevated blood pressure. Patient reports she's had dizziness in the past, admitted in January a year ago had normal MRI at that time. Patient reports she had a normal day, was taking a nap in the evening, and woke up at 7:00 with the symptoms. Symptoms resolved within an hour and a half. Patient denies any focal numbness or weakness during this episode. Patient took a dose of hydrochlorothiazide as directed by her doctor due to systolic blood pressures in the 190s. She is feeling back to baseline at this time. Husband was concerned as was daughter who brought her to the emergency department.  Past Medical History  Diagnosis Date  . Unspecified vitamin D deficiency   . Headache 02/15/2010  . ALLERGIC RHINITIS CAUSE UNSPECIFIED   . ANXIETY   . DEPRESSION, MAJOR, MODERATE   . HYPERTENSION, BENIGN ESSENTIAL   . HYPOTHYROIDISM   . OSTEOPOROSIS   . Autoimmune hepatitis     on MP6, prev pred    Past Surgical History  Procedure Date  . Cataract extraction 2008    Left eye  . Cataract extraction 2009    Right eye  . Tonsillectomy 1945    Family History  Problem Relation Age of Onset  . Hypertension Mother   . Stroke Mother   . Heart disease Father   . Stroke Sister     History  Substance Use Topics  . Smoking status: Never Smoker   . Smokeless tobacco: Never Used   Comment: Married, Retired- daughter is Okey Regal Margraf-Fifield responsible for majority of supervison for parents  .  Alcohol Use: No    OB History    Grav Para Term Preterm Abortions TAB SAB Ect Mult Living                  Review of Systems  All other systems reviewed and are negative.    Allergies  Amlodipine besylate; Azathioprine; Penicillins; Sertraline hcl; and Sulfonamide derivatives  Home Medications   Current Outpatient Rx  Name Route Sig Dispense Refill  . ABILIFY 2 MG PO TABS  Take 1 by mouth daily    . ALENDRONATE SODIUM 70 MG PO TABS Oral Take 70 mg by mouth every 7 (seven) days. On Saturdays; Take with a full glass of water on an empty stomach.    . ASPIRIN EC 81 MG PO TBEC Oral Take 81 mg by mouth daily as needed.     . ATENOLOL 50 MG PO TABS Oral Take 1.5 tablets (75 mg total) by mouth daily. 45 tablet 5  . CALCIUM GLUCONATE 500 MG PO TABS Oral Take 500 mg by mouth daily.      [name] Kitchen VITAMIN D3 2000 UNITS PO TABS Oral Take 2,000 Units by mouth daily.      [name] Kitchen HYDROCHLOROTHIAZIDE 12.5 MG PO CAPS Oral Take 12.5 mg by mouth daily as needed. For fluid retention 30 capsule 11  . LEVOTHYROXINE SODIUM 50 MCG PO TABS Oral Take 50 mcg by mouth daily.    [name] Kitchen LISINOPRIL 40 MG PO  TABS Oral Take 40 mg by mouth daily.    [name] Kitchen LORAZEPAM 0.5 MG PO TABS Oral Take 0.5 mg by mouth daily.     . MERCAPTOPURINE 50 MG PO TABS Oral Take 0.5 tablets (25 mg total) by mouth daily. Take 1/2 by mouth daily 20 tablet 11  . VIIBRYD 20 MG PO TABS Oral Take 20 mg by mouth daily.       BP 166/68  Pulse 60  Temp 97.9 F (36.6 C) (Oral)  Resp 18  SpO2 98%  Physical Exam  Nursing note and vitals reviewed. Constitutional: She is oriented to person, place, and time. She appears well-developed and well-nourished. No distress.  HENT:  Head: Normocephalic and atraumatic.  Nose: Nose normal.  Mouth/Throat: Oropharynx is clear and moist.  Eyes: Conjunctivae normal and EOM are normal. Pupils are equal, round, and reactive to light.  Neck: Normal range of motion. Neck supple. No JVD present. No tracheal deviation present.  No thyromegaly present.  Cardiovascular: Normal rate, regular rhythm, normal heart sounds and intact distal pulses.  Exam reveals no gallop and no friction rub.   No murmur heard. Pulmonary/Chest: Effort normal and breath sounds normal. No stridor. No respiratory distress. She has no wheezes. She has no rales. She exhibits no tenderness.  Abdominal: Soft. Bowel sounds are normal. She exhibits no distension and no mass. There is no tenderness. There is no rebound and no guarding.  Musculoskeletal: Normal range of motion. She exhibits no edema and no tenderness.  Lymphadenopathy:    She has no cervical adenopathy.  Neurological: She is alert and oriented to person, place, and time. She has normal reflexes. No cranial nerve deficit. She exhibits normal muscle tone. Coordination normal.  Skin: Skin is warm and dry. No rash noted. She is not diaphoretic. No erythema. No pallor.  Psychiatric: She has a normal mood and affect. Her behavior is normal. Judgment and thought content normal.    ED Course  Procedures (including critical care time)  Results for orders placed during the hospital encounter of 11/23/11  CBC WITH DIFFERENTIAL      Component Value Range   WBC 5.6  4.0 - 10.5 K/uL   RBC 3.73 (*) 3.87 - 5.11 MIL/uL   Hemoglobin 13.2  12.0 - 15.0 g/dL   HCT 16.1  09.6 - 04.5 %   MCV 99.2  78.0 - 100.0 fL   MCH 35.4 (*) 26.0 - 34.0 pg   MCHC 35.7  30.0 - 36.0 g/dL   RDW 40.9  81.1 - 91.4 %   Platelets 220  150 - 400 K/uL   Neutrophils Relative 66  43 - 77 %   Neutro Abs 3.7  1.7 - 7.7 K/uL   Lymphocytes Relative 21  12 - 46 %   Lymphs Abs 1.2  0.7 - 4.0 K/uL   Monocytes Relative 10  3 - 12 %   Monocytes Absolute 0.6  0.1 - 1.0 K/uL   Eosinophils Relative 2  0 - 5 %   Eosinophils Absolute 0.1  0.0 - 0.7 K/uL   Basophils Relative 0  0 - 1 %   Basophils Absolute 0.0  0.0 - 0.1 K/uL  COMPREHENSIVE METABOLIC PANEL      Component Value Range   Sodium 121 (*) 135 - 145 mEq/L   Potassium  3.8  3.5 - 5.1 mEq/L   Chloride 85 (*) 96 - 112 mEq/L   CO2 28  19 - 32 mEq/L   Glucose, Bld 226 (*)  70 - 99 mg/dL   BUN 17  6 - 23 mg/dL   Creatinine, Ser 1.19  0.50 - 1.10 mg/dL   Calcium 14.7  8.4 - 82.9 mg/dL   Total Protein 7.6  6.0 - 8.3 g/dL   Albumin 3.9  3.5 - 5.2 g/dL   AST 26  0 - 37 U/L   ALT 15  0 - 35 U/L   Alkaline Phosphatase 51  39 - 117 U/L   Total Bilirubin 0.6  0.3 - 1.2 mg/dL   GFR calc non Af Amer 63 (*) >90 mL/min   GFR calc Af Amer 72 (*) >90 mL/min  POCT I-STAT TROPONIN I      Component Value Range   Troponin i, poc 0.01  0.00 - 0.08 ng/mL   Comment 3           URINALYSIS, ROUTINE W REFLEX MICROSCOPIC      Component Value Range   Color, Urine YELLOW  YELLOW   APPearance CLOUDY (*) CLEAR   Specific Gravity, Urine 1.013  1.005 - 1.030   pH 7.5  5.0 - 8.0   Glucose, UA 100 (*) NEGATIVE mg/dL   Hgb urine dipstick NEGATIVE  NEGATIVE   Bilirubin Urine NEGATIVE  NEGATIVE   Ketones, ur NEGATIVE  NEGATIVE mg/dL   Protein, ur NEGATIVE  NEGATIVE mg/dL   Urobilinogen, UA 1.0  0.0 - 1.0 mg/dL   Nitrite NEGATIVE  NEGATIVE   Leukocytes, UA NEGATIVE  NEGATIVE      Date: 11/23/2011  Rate: 51  Rhythm: normal sinus rhythm  QRS Axis: normal  Intervals: PR prolonged  ST/T Wave abnormalities: normal  Conduction Disutrbances:first-degree A-V block   Narrative Interpretation:   Old EKG Reviewed: none available    1. Hypertension   2. Hyperglycemia   3. Stroke-like episode   4. Hyponatremia       MDM  78 year old female with one hour of disequilibrium, dysarthric speech, "word salad", noted to be hyponatremic and hyperglycemic here today. No prior history of same. Blood pressure elevated but improved from reported 190s at home. We'll discuss with hospitalist for admission.          Olivia Mackie, MD 11/24/11 [phone]

## 2011-11-24 ENCOUNTER — Encounter (HOSPITAL_COMMUNITY): Payer: Self-pay | Admitting: Internal Medicine

## 2011-11-24 ENCOUNTER — Observation Stay (HOSPITAL_COMMUNITY): Payer: Medicare Other

## 2011-11-24 DIAGNOSIS — R4701 Aphasia: Secondary | ICD-10-CM

## 2011-11-24 DIAGNOSIS — I10 Essential (primary) hypertension: Secondary | ICD-10-CM

## 2011-11-24 DIAGNOSIS — E871 Hypo-osmolality and hyponatremia: Secondary | ICD-10-CM | POA: Diagnosis present

## 2011-11-24 DIAGNOSIS — R7309 Other abnormal glucose: Secondary | ICD-10-CM

## 2011-11-24 DIAGNOSIS — R739 Hyperglycemia, unspecified: Secondary | ICD-10-CM | POA: Diagnosis present

## 2011-11-24 LAB — BASIC METABOLIC PANEL
CO2: 29 mEq/L (ref 19–32)
CO2: 25 mEq/L (ref 19–32)
Calcium: 9.2 mg/dL (ref 8.4–10.5)
Chloride: 93 mEq/L — ABNORMAL LOW (ref 96–112)
Chloride: 95 mEq/L — ABNORMAL LOW (ref 96–112)
GFR calc Af Amer: >90 mL/min (ref >90)
GFR calc Af Amer: >90 mL/min (ref >90)
GFR calc non Af Amer: 74 mL/min — ABNORMAL LOW (ref >90)
GFR calc non Af Amer: 79 mL/min — ABNORMAL LOW (ref >90)
Potassium: 3.8 mEq/L (ref 3.5–5.1)
Potassium: 3.5 mEq/L (ref 3.5–5.1)
Potassium: 4 mEq/L (ref 3.5–5.1)
Sodium: 130 mEq/L — ABNORMAL LOW (ref 135–145)
Sodium: 127 mEq/L — ABNORMAL LOW (ref 135–145)

## 2011-11-24 LAB — CBC WITH DIFFERENTIAL/PLATELET
Basophils Relative: 0 % (ref 0–1)
Eosinophils Absolute: 0.1 10*3/uL (ref 0.0–0.7)
HCT: 37.5 % (ref 36.0–46.0)
Hemoglobin: 13.2 g/dL (ref 12.0–15.0)
Lymphs Abs: 1.1 10*3/uL (ref 0.7–4.0)
MCH: 34.7 pg — ABNORMAL HIGH (ref 26.0–34.0)
MCHC: 35.2 g/dL (ref 30.0–36.0)
MCV: 98.7 fL (ref 78.0–100.0)
Monocytes Absolute: 0.7 10*3/uL (ref 0.1–1.0)
Monocytes Relative: 11 % (ref 3–12)
RBC: 3.8 MIL/uL — ABNORMAL LOW (ref 3.87–5.11)

## 2011-11-24 LAB — URINALYSIS, ROUTINE W REFLEX MICROSCOPIC
Bilirubin Urine: NEGATIVE
Glucose, UA: 100 mg/dL — AB
Hgb urine dipstick: NEGATIVE
Specific Gravity, Urine: 1.013 (ref 1.005–1.030)
Urobilinogen, UA: 1 mg/dL (ref 0.0–1.0)
pH: 7.5 (ref 5.0–8.0)

## 2011-11-24 LAB — CORTISOL: Cortisol, Plasma: 12.1 ug/dL

## 2011-11-24 LAB — LIPID PANEL
Cholesterol: 168 mg/dL (ref 0–200)
Total CHOL/HDL Ratio: 2 RATIO
Triglycerides: 33 mg/dL (ref <150)
VLDL: 7 mg/dL (ref 0–40)

## 2011-11-24 LAB — RAPID URINE DRUG SCREEN, HOSP PERFORMED
Barbiturates: NOT DETECTED
Tetrahydrocannabinol: NOT DETECTED

## 2011-11-24 LAB — GLUCOSE, CAPILLARY: Glucose-Capillary: 86 mg/dL (ref 70–99)

## 2011-11-24 LAB — HEMOGLOBIN A1C: Mean Plasma Glucose: 128 mg/dL — ABNORMAL HIGH (ref <117)

## 2011-11-24 MED ORDER — ARIPIPRAZOLE 2 MG PO TABS
2.0000 mg | ORAL_TABLET | Freq: Every day | ORAL | Status: DC
  Administered 2011-11-24 – 2011-11-25 (×2): 2 mg via ORAL
  Filled 2011-11-24 (×2): qty 1

## 2011-11-24 MED ORDER — LORAZEPAM 0.5 MG PO TABS
0.5000 mg | ORAL_TABLET | Freq: Every day | ORAL | Status: DC
  Administered 2011-11-24 – 2011-11-25 (×2): 0.5 mg via ORAL
  Filled 2011-11-24 (×2): qty 1

## 2011-11-24 MED ORDER — INSULIN ASPART 100 UNIT/ML ~~LOC~~ SOLN
0.0000 [IU] | Freq: Three times a day (TID) | SUBCUTANEOUS | Status: DC
  Administered 2011-11-24: 1 [IU] via SUBCUTANEOUS

## 2011-11-24 MED ORDER — MERCAPTOPURINE 50 MG PO TABS
25.0000 mg | ORAL_TABLET | Freq: Every day | ORAL | Status: DC
  Administered 2011-11-24 – 2011-11-25 (×2): 25 mg via ORAL
  Filled 2011-11-24 (×3): qty 1

## 2011-11-24 MED ORDER — LISINOPRIL 40 MG PO TABS
40.0000 mg | ORAL_TABLET | Freq: Every day | ORAL | Status: DC
  Administered 2011-11-24 – 2011-11-25 (×2): 40 mg via ORAL
  Filled 2011-11-24 (×2): qty 1

## 2011-11-24 MED ORDER — HYDRALAZINE HCL 20 MG/ML IJ SOLN
10.0000 mg | INTRAMUSCULAR | Status: DC | PRN
  Filled 2011-11-24: qty 0.5

## 2011-11-24 MED ORDER — SODIUM CHLORIDE 0.9 % IV SOLN
INTRAVENOUS | Status: AC
  Administered 2011-11-24: 10:00:00 via INTRAVENOUS

## 2011-11-24 MED ORDER — ATENOLOL 50 MG PO TABS
75.0000 mg | ORAL_TABLET | Freq: Every day | ORAL | Status: DC
  Administered 2011-11-24: 75 mg via ORAL
  Filled 2011-11-24 (×2): qty 1

## 2011-11-24 MED ORDER — VILAZODONE HCL 20 MG PO TABS
20.0000 mg | ORAL_TABLET | Freq: Every day | ORAL | Status: DC
  Administered 2011-11-24 – 2011-11-25 (×2): 20 mg via ORAL
  Filled 2011-11-24 (×2): qty 1

## 2011-11-24 MED ORDER — ASPIRIN 325 MG PO TABS
325.0000 mg | ORAL_TABLET | Freq: Every day | ORAL | Status: DC
  Administered 2011-11-24 – 2011-11-25 (×2): 325 mg via ORAL
  Filled 2011-11-24 (×3): qty 1

## 2011-11-24 MED ORDER — LEVOTHYROXINE SODIUM 50 MCG PO TABS
50.0000 ug | ORAL_TABLET | Freq: Every day | ORAL | Status: DC
  Administered 2011-11-24 – 2011-11-25 (×2): 50 ug via ORAL
  Filled 2011-11-24 (×3): qty 1

## 2011-11-24 MED ORDER — SODIUM CHLORIDE 0.9 % IV SOLN
INTRAVENOUS | Status: DC
  Administered 2011-11-24: 04:00:00 via INTRAVENOUS

## 2011-11-24 NOTE — Progress Notes (Signed)
Patient admitted to room 4N30, oriented to room, admission completed. Telemetry monitor on, IV fluids running. Will collect urine sample when available.  EKG done.  Marisa Smith

## 2011-11-24 NOTE — Progress Notes (Signed)
*  PRELIMINARY RESULTS* Vascular Ultrasound Carotid Duplex (Doppler) has been completed.  Preliminary findings: Bilateral:  No evidence of hemodynamically significant internal carotid artery stenosis.   Vertebral artery flow is antegrade.     Farrel Demark, RDMS, RVT 11/24/2011, 12:31 PM

## 2011-11-24 NOTE — H&P (Signed)
Marisa Smith is an 78 y.o. female.   Patient was seen and examined on November 24, 2011. PCP - Dr. Felicity Coyer. Chief Complaint: Dizziness and speech difficulties and increased blood pressure. HPI: 78 year-old female with history of autoimmune hepatitis, hypertension and hypothyroidism had woken up from her nap at around 7 PM last evening when she started feeling dizzy and also had some difficulty speaking. Patient found that things are spinning around and she had difficulty balancing but she did not fall or lose consciousness. She did not have any associated nausea vomiting or tinnitus. Patient also had some difficult cleaning out words. The whole symptoms may have lasted for less than half an hour after which she got better. At that time when patient measured her blood pressure it was in the 190s systolic. By the time patient came to the ER most of the symptoms have resolved and her blood pressure was in the 160s and 170s systolic. CT head did not show any acute. Patient will be admitted for further observation.   In addition patient was found to be hyponatremic sodium in the 121 and hyperglycemic.  Past Medical History  Diagnosis Date  . Unspecified vitamin D deficiency   . Headache 02/15/2010  . ALLERGIC RHINITIS CAUSE UNSPECIFIED   . ANXIETY   . DEPRESSION, MAJOR, MODERATE   . HYPERTENSION, BENIGN ESSENTIAL   . HYPOTHYROIDISM   . OSTEOPOROSIS   . Autoimmune hepatitis     on MP6, prev pred    Past Surgical History  Procedure Date  . Cataract extraction 2008    Left eye  . Cataract extraction 2009    Right eye  . Tonsillectomy 1945    Family History  Problem Relation Age of Onset  . Hypertension Mother   . Stroke Mother   . Heart disease Father   . Stroke Sister    Social History:  reports that she has never smoked. She has never used smokeless tobacco. She reports that she does not drink alcohol or use illicit drugs.  Allergies:  Allergies  Allergen Reactions  . Amlodipine  Besylate     REACTION: itching  . Azathioprine     REACTION: nausea  . Penicillins     REACTION: yeast infection  . Sertraline Hcl     REACTION: rash  . Sulfonamide Derivatives     REACTION: swelling and itching     (Not in a hospital admission)  Results for orders placed during the hospital encounter of 11/23/11 (from the past 48 hour(s))  CBC WITH DIFFERENTIAL     Status: Abnormal   Collection Time   11/23/11  9:43 PM      Component Value Range Comment   WBC 5.6  4.0 - 10.5 K/uL    RBC 3.73 (*) 3.87 - 5.11 MIL/uL    Hemoglobin 13.2  12.0 - 15.0 g/dL    HCT 17.5  10.2 - 58.5 %    MCV 99.2  78.0 - 100.0 fL    MCH 35.4 (*) 26.0 - 34.0 pg    MCHC 35.7  30.0 - 36.0 g/dL    RDW 27.7  82.4 - 23.5 %    Platelets 220  150 - 400 K/uL    Neutrophils Relative 66  43 - 77 %    Neutro Abs 3.7  1.7 - 7.7 K/uL    Lymphocytes Relative 21  12 - 46 %    Lymphs Abs 1.2  0.7 - 4.0 K/uL    Monocytes Relative 10  3 - 12 %    Monocytes Absolute 0.6  0.1 - 1.0 K/uL    Eosinophils Relative 2  0 - 5 %    Eosinophils Absolute 0.1  0.0 - 0.7 K/uL    Basophils Relative 0  0 - 1 %    Basophils Absolute 0.0  0.0 - 0.1 K/uL   COMPREHENSIVE METABOLIC PANEL     Status: Abnormal   Collection Time   11/23/11  9:43 PM      Component Value Range Comment   Sodium 121 (*) 135 - 145 mEq/L    Potassium 3.8  3.5 - 5.1 mEq/L    Chloride 85 (*) 96 - 112 mEq/L    CO2 28  19 - 32 mEq/L    Glucose, Bld 226 (*) 70 - 99 mg/dL    BUN 17  6 - 23 mg/dL    Creatinine, Ser 4.09  0.50 - 1.10 mg/dL    Calcium 81.1  8.4 - 10.5 mg/dL    Total Protein 7.6  6.0 - 8.3 g/dL    Albumin 3.9  3.5 - 5.2 g/dL    AST 26  0 - 37 U/L    ALT 15  0 - 35 U/L    Alkaline Phosphatase 51  39 - 117 U/L    Total Bilirubin 0.6  0.3 - 1.2 mg/dL    GFR calc non Af Amer 63 (*) >90 mL/min    GFR calc Af Amer 72 (*) >90 mL/min   POCT I-STAT TROPONIN I     Status: Normal   Collection Time   11/23/11 10:01 PM      Component Value Range  Comment   Troponin i, poc 0.01  0.00 - 0.08 ng/mL    Comment 3            URINALYSIS, ROUTINE W REFLEX MICROSCOPIC     Status: Abnormal   Collection Time   11/23/11 11:37 PM      Component Value Range Comment   Color, Urine YELLOW  YELLOW    APPearance CLOUDY (*) CLEAR    Specific Gravity, Urine 1.013  1.005 - 1.030    pH 7.5  5.0 - 8.0    Glucose, UA 100 (*) NEGATIVE mg/dL    Hgb urine dipstick NEGATIVE  NEGATIVE    Bilirubin Urine NEGATIVE  NEGATIVE    Ketones, ur NEGATIVE  NEGATIVE mg/dL    Protein, ur NEGATIVE  NEGATIVE mg/dL    Urobilinogen, UA 1.0  0.0 - 1.0 mg/dL    Nitrite NEGATIVE  NEGATIVE    Leukocytes, UA NEGATIVE  NEGATIVE MICROSCOPIC NOT DONE ON URINES WITH NEGATIVE PROTEIN, BLOOD, LEUKOCYTES, NITRITE, OR GLUCOSE <1000 mg/dL.   Ct Head Wo Contrast  11/24/2011  *RADIOLOGY REPORT*  Clinical Data: The patient woke up dizzy.  CT HEAD WITHOUT CONTRAST  Technique:  Contiguous axial images were obtained from the base of the skull through the vertex without contrast.  Comparison: Brain MRI 02/22/2010  Findings: There is mild cerebral atrophy.  There is diffuse low density in the periventricular and subcortical white matter bilaterally.  No evidence for acute hemorrhage, mass lesion, midline shift, hydrocephalus or large infarct.  The visualized paranasal sinuses are clear.  No acute bony abnormality. There may be some chronic fluid in the left mastoid air cells.  IMPRESSION: No acute intracranial abnormality.  There is mild atrophy and evidence for chronic small vessel ischemic changes.   Original Report Authenticated By: Richarda Overlie, M.D.  Review of Systems  Constitutional: Negative.   HENT: Negative.   Eyes: Negative.   Respiratory: Negative.   Cardiovascular: Negative.   Gastrointestinal: Negative.   Genitourinary: Negative.   Musculoskeletal: Negative.   Skin: Negative.   Neurological: Positive for dizziness and speech change.  Endo/Heme/Allergies: Negative.     Psychiatric/Behavioral: Negative.     Blood pressure 179/68, pulse 78, temperature 98.2 F (36.8 C), temperature source Oral, resp. rate 18, SpO2 98.00%. Physical Exam  Constitutional: She is oriented to person, place, and time. She appears well-developed and well-nourished. No distress.  HENT:  Head: Normocephalic and atraumatic.  Right Ear: External ear normal.  Left Ear: External ear normal.  Nose: Nose normal.  Mouth/Throat: Oropharynx is clear and moist. No oropharyngeal exudate.  Eyes: Conjunctivae normal are normal. Pupils are equal, round, and reactive to light. Right eye exhibits no discharge. Left eye exhibits no discharge. No scleral icterus.  Neck: Normal range of motion. Neck supple.  Cardiovascular: Normal rate and regular rhythm.   Respiratory: Effort normal and breath sounds normal. No respiratory distress. She has no wheezes. She has no rales.  GI: Soft. Bowel sounds are normal. She exhibits no distension. There is no tenderness. There is no rebound and no guarding.  Musculoskeletal: She exhibits no edema and no tenderness.  Neurological: She is alert and oriented to person, place, and time.       Moves all extremities 5/5. No fecial asymmetry. Tongue is midline.  Skin: Skin is warm and dry. She is not diaphoretic.     Assessment/Plan #1. Brief episode of dizziness and expressive aphasia - at this time patient's symptoms are concerning for TIA. Patient will be placed on neurochecks, swallow evaluation, we'll obtain MRI/MRA brain and 2-D echo and carotid Doppler. Closely follow rhythm in telemetry. Check EKG. #2. Hyponatremia - at this time we will discontinue HCTZ. We will gently hydrate with IV saline with frequent metabolic panel checks to check sodium trend. Check urine sodium, urine osmolality, serum osmolality, TSH and cortisol levels. #3. Hyperglycemia - concerning for new-onset diabetes mellitus. Check hemoglobin A1c. Patient will be placed on sliding scale  coverage for now. #4. Hypertension - continue home medication except for HCTZ as patient has hyponatremia. Patient's daughter states that her blood pressure fluctuates very frequently. We will check plasma metanephrines. At this time I have placed patient on when necessary IV hydralazine for systolic blood pressure more than 200.  #5. History of autoimmune hepatitis  - continue present medication.   CODE STATUS  - full code.    Mickie Kozikowski N. 11/24/2011, 2:54 AM

## 2011-11-24 NOTE — Evaluation (Signed)
Physical Therapy Evaluation Patient Details Name: Marisa Smith MRN: [id number] DOB: [birth year] Today's Date: 11/24/2011 Time: 1308-6578 PT Time Calculation (min): 18 min  PT Assessment / Plan / Recommendation Clinical Impression  Patient is an 78 yo female admitted with expressive aphasia, dizziness, HTN to r/o CVA/TIA.  Symptoms have resolved per patient.  However, patient has slight balance issues during gait.  Encouraged patient to use cane for safety at home.  Recommend HHPT for home safety evaluation, continued therapy, balance program.  Patient will benefit from acute PT to maximize independence prior to discharge.    PT Assessment  Patient needs continued PT services    Follow Up Recommendations  Home health PT;Supervision - Intermittent    Does the patient have the potential to tolerate intense rehabilitation      Barriers to Discharge        Equipment Recommendations  None recommended by PT    Recommendations for Other Services     Frequency Min 4X/week    Precautions / Restrictions Precautions Precautions: Fall Restrictions Weight Bearing Restrictions: No   Pertinent Vitals/Pain       Mobility  Bed Mobility Bed Mobility: Supine to Sit;Sitting - Scoot to Edge of Bed Supine to Sit: 6: Modified independent (Device/Increase time);HOB flat;With rails Sitting - Scoot to Edge of Bed: 6: Modified independent (Device/Increase time);With rail Details for Bed Mobility Assistance: No cues or assist needed. Transfers Transfers: Sit to Stand;Stand to Sit Sit to Stand: 5: Supervision;With upper extremity assist;From bed Stand to Sit: 5: Supervision;With upper extremity assist;With armrests;To chair/3-in-1 Details for Transfer Assistance: Verbal cues for hand placement. Ambulation/Gait Ambulation/Gait Assistance: 4: Min guard Ambulation Distance (Feet): 200 Feet Assistive device: None;Straight cane Ambulation/Gait Assistance Details: Patient ambulated 100' without AD  with occasional staggering.  Able to self-correct.  Ambulated remaining 100' with straight cane - improved balance. Gait Pattern: Step-through pattern;Decreased stride length;Shuffle;Trunk flexed Gait velocity: Slow gait speed Stairs: Yes Stairs Assistance: 5: Supervision Stair Management Technique: One rail Right;Alternating pattern;Forwards Number of Stairs: 5  Modified Rankin (Stroke Patients Only) Pre-Morbid Rankin Score: No symptoms Modified Rankin: No significant disability           PT Diagnosis: Abnormality of gait  PT Problem List: Decreased balance;Decreased mobility;Decreased knowledge of use of DME PT Treatment Interventions: DME instruction;Gait training;Functional mobility training;Balance training;Patient/family education   PT Goals Acute Rehab PT Goals PT Goal Formulation: With patient Time For Goal Achievement: 12/01/11 Potential to Achieve Goals: Good Pt will go Sit to Stand: with modified independence;with upper extremity assist PT Goal: Sit to Stand - Progress: Goal set today Pt will go Stand to Sit: with modified independence;with upper extremity assist PT Goal: Stand to Sit - Progress: Goal set today Pt will Ambulate: >150 feet;with modified independence;with least restrictive assistive device (without loss of balance) PT Goal: Ambulate - Progress: Goal set today Additional Goals Additional Goal #1: Patient will score > 20 on DGI balance assessment to indicate low fall risk. PT Goal: Additional Goal #1 - Progress: Goal set today  Visit Information  Last PT Received On: 11/24/11 Assistance Needed: +1    Subjective Data  Subjective: "I've been in the bed all day" Patient Stated Goal: To return home   Prior Functioning  Home Living Lives With: Spouse (Spouse has Parkinson's disease.  Daughter lives nearby) Available Help at Discharge: Family;Available 24 hours/day Type of Home: House (Townhouse) Home Access: Level entry Home Layout: One  level Bathroom Shower/Tub: Engineer, manufacturing systems: Standard Bathroom Accessibility: Yes How  Accessible: Accessible via walker Home Adaptive Equipment: Straight cane;Shower chair with back Prior Function Level of Independence: Independent with assistive device(s) (Uses cane in evenings) Able to Take Stairs?: Yes Driving: Yes Vocation: Retired Musician: Expressive difficulties (Trouble finding words during conversation)    Cognition  Overall Cognitive Status: Appears within functional limits for tasks assessed/performed Arousal/Alertness: Awake/alert Orientation Level: Appears intact for tasks assessed Behavior During Session: Flat affect    Extremity/Trunk Assessment Right Upper Extremity Assessment RUE ROM/Strength/Tone: [facility] for tasks assessed Left Upper Extremity Assessment LUE ROM/Strength/Tone: WFL for tasks assessed Right Lower Extremity Assessment RLE ROM/Strength/Tone: WFL for tasks assessed RLE Sensation: WFL - Light Touch Left Lower Extremity Assessment LLE ROM/Strength/Tone: WFL for tasks assessed LLE Sensation: WFL - Light Touch   Balance Balance Balance Assessed: Yes High Level Balance High Level Balance Activites: Turns;Sudden stops;Head turns High Level Balance Comments: Noted loss of balance during high level balance activities - required assist to maintain balance.  End of Session PT - End of Session Equipment Utilized During Treatment: Gait belt Activity Tolerance: Patient tolerated treatment well Patient left: in chair;with call bell/phone within reach Nurse Communication: Mobility status  GP Functional Assessment Tool Used: Clinical judgement Functional Limitation: Mobility: Walking and moving around Mobility: Walking and Moving Around Current Status (A2130): At least 20 percent but less than 40 percent impaired, limited or restricted Mobility: Walking and Moving Around Goal Status [phone]): At least 1 percent but less than 20  percent impaired, limited or restricted   Vena Austria 11/24/2011, 5:11 PM Durenda Hurt. Renaldo Fiddler, [facility] Acute Rehab Services Pager [phone]

## 2011-11-24 NOTE — Progress Notes (Signed)
Call from monitor tech, pulse 43-44 sustaining. Radial palpation pulse 56. Patient resting quietly.

## 2011-11-24 NOTE — Progress Notes (Signed)
  Echocardiogram 2D Echocardiogram has been performed.  Marisa Smith 11/24/2011, 1:03 PM

## 2011-11-25 DIAGNOSIS — I10 Essential (primary) hypertension: Secondary | ICD-10-CM

## 2011-11-25 DIAGNOSIS — R259 Unspecified abnormal involuntary movements: Secondary | ICD-10-CM

## 2011-11-25 DIAGNOSIS — F411 Generalized anxiety disorder: Secondary | ICD-10-CM

## 2011-11-25 LAB — GLUCOSE, CAPILLARY
Glucose-Capillary: 75 mg/dL (ref 70–99)
Glucose-Capillary: 100 mg/dL — ABNORMAL HIGH (ref 70–99)

## 2011-11-25 MED ORDER — SODIUM CHLORIDE 0.9 % IV SOLN
INTRAVENOUS | Status: DC
  Administered 2011-11-25: 07:00:00 via INTRAVENOUS

## 2011-11-25 NOTE — Discharge Summary (Signed)
[facility]                                                                                   Adelina Collard, is a 78 y.o. female  DOB [birth year]  MRN [id number].  Admission date:  11/23/2011  Discharge Date:  11/25/2011  Primary MD  Rene Paci, MD  Admitting Physician  Eduard Clos, MD  Admission Diagnosis  Hyponatremia [276.1] Hypertension [401.9] Hyperglycemia [790.29] Expressive aphasia [784.3] Stroke-like episode [434.91] Expressive aphasia  Discharge Diagnosis     Principal Problem:  *Expressive aphasia Active Problems:  HYPERTENSION, BENIGN ESSENTIAL  Hyponatremia  Hyperglycemia      Past Medical History  Diagnosis Date  . Unspecified vitamin D deficiency   . Headache 02/15/2010  . ALLERGIC RHINITIS CAUSE UNSPECIFIED   . ANXIETY   . DEPRESSION, MAJOR, MODERATE   . HYPERTENSION, BENIGN ESSENTIAL   . HYPOTHYROIDISM   . OSTEOPOROSIS   . Autoimmune hepatitis     on MP6, prev pred    Past Surgical History  Procedure Date  . Cataract extraction 2008    Left eye  . Cataract extraction 2009    Right eye  . Tonsillectomy 1945     Recommendations for primary care physician for things to follow:   Monitor patient's BMP and A1c   Discharge Diagnoses:   Principal Problem:  *Expressive aphasia Active Problems:  HYPERTENSION, BENIGN ESSENTIAL  Hyponatremia  Hyperglycemia    Discharge Condition: Stable   Diet recommendation: See Discharge Instructions below   Consults Neurology   History of present illness and  Hospital Course:  See H&P, Labs, Consult and Test reports for all details in brief, patient was admitted for expressive aphagia likely due to TIA versus severe dehydration caused by taking diuretic, when patient was admitted her sodium was 121, she was given IV fluids and her diuretic was held with good results. Her expressive if easier has completely resolved. She has no focal neurological deficits, she  underwent MRI MRA of the head which were unremarkable, A1c was borderline at 6.1, LDL was under 100, her echogram and carotid duplex were unremarkable, she will be continued on her home dose 81 mg of aspirin with close followup with primary care physician and neurologist as outpatient. She has been suggested low carbohydrate diet since she is at high risk for developing diabetes according to her A1c.   Patient had hyponatremia caused due to dehydration from diuretic, urine sodium was less than 40, she's been giving IV fluids with good improvement in her sodium, she has received another 500 cc of normal saline this morning before her discharge, will request primary care physician to kindly repeat BMP in 2-3 days.    Her chronic medical problems of hypertension, hypothyroidism stable continue followup with primary care physician.    Today   Subjective:   Shawnia Vizcarrondo today has no headache,no chest abdominal pain,no new weakness tingling or numbness, feels much better wants to go home today.   Objective:   Blood pressure 143/48, pulse 68, temperature 97.6 F (36.4 C), temperature source Oral, resp. rate 16, height 5\' 5"  (1.651 m), weight 49.85 kg (109 lb 14.4 oz), SpO2  99.00%.   Intake/Output Summary (Last 24 hours) at 11/25/11 0828 Last data filed at 11/24/11 1524  Gross per 24 hour  Intake      0 ml  Output    100 ml  Net   -100 ml    Exam Awake Alert, Oriented *3, No new F.N deficits, Normal affect [place].AT,PERRAL Supple Neck,No JVD, No cervical lymphadenopathy appriciated.  Symmetrical Chest wall movement, Good air movement bilaterally, CTAB RRR,No Gallops,Rubs or new Murmurs, No Parasternal Heave +ve B.Sounds, Abd Soft, Non tender, No organomegaly appriciated, No rebound -guarding or rigidity. No Cyanosis, Clubbing or edema, No new Rash or bruise  Data Review   Echo  - Left ventricle: The cavity size was normal. Systolic function was normal. The estimated ejection fraction  was in the range of 55% to 60%. Wall motion was normal; there were no regional wall motion abnormalities. - Aortic valve: Mild regurgitation. - Mitral valve: Moderate regurgitation. - Pulmonary arteries: Systolic pressure was mildly increased. PA peak pressure: 32mm Hg (S).    Ct Head Wo Contrast  11/24/2011  *RADIOLOGY REPORT*  Clinical Data: The patient woke up dizzy.  CT HEAD WITHOUT CONTRAST  Technique:  Contiguous axial images were obtained from the base of the skull through the vertex without contrast.  Comparison: Brain MRI 02/22/2010  Findings: There is mild cerebral atrophy.  There is diffuse low density in the periventricular and subcortical white matter bilaterally.  No evidence for acute hemorrhage, mass lesion, midline shift, hydrocephalus or large infarct.  The visualized paranasal sinuses are clear.  No acute bony abnormality. There may be some chronic fluid in the left mastoid air cells.  IMPRESSION: No acute intracranial abnormality.  There is mild atrophy and evidence for chronic small vessel ischemic changes.   Original Report Authenticated By: Richarda Overlie, M.D.    Mri Brain Without Contrast  11/24/2011  *RADIOLOGY REPORT*  Clinical Data:  Acute onset dizziness.  Possible stroke.  Acute but ill-defined vascular disease.  Hypertension.  MRI HEAD WITHOUT CONTRAST MRA HEAD WITHOUT CONTRAST  Technique:  Multiplanar, multiecho pulse sequences of the brain and surrounding structures were obtained without intravenous contrast. Angiographic images of the head were obtained using MRA technique without contrast.  Comparison:  Head CT 11/23/2011.  MRI 02/22/2010.  MRI HEAD  Findings:  Diffusion imaging does not show any acute or subacute infarction.  The brain shows generalized atrophy.  There is extensive chronic small vessel disease throughout the cerebral hemispheric white matter.  There are a few old lacunar insults within the basal ganglia and thalami.  No cortical or large vessel  territory infarction.  No mass lesion, hemorrhage, hydrocephalus or extra-axial collection.  No pituitary mass.  Sinuses, middle ears and mastoids are clear.  IMPRESSION: No acute finding.  Atrophy and extensive chronic small vessel disease throughout the cerebral hemispheric white matter.  MRA HEAD  Findings: Both internal carotid arteries are widely patent into the brain.  The anterior and middle cerebral vessels are patent without proximal stenosis, aneurysm or vascular malformation.  Both vertebral arteries are patent to the basilar.  No basilar stenosis. Posterior circulation branch vessels are normal.  Both posterior cerebral arteries take a fetal origin from the anterior circulation.  IMPRESSION: No large or medium vessel occlusion or correctable proximal stenosis.   Original Report Authenticated By: Thomasenia Sales, M.D.    Mr Mra Head/brain Wo Cm  11/24/2011  *RADIOLOGY REPORT*  Clinical Data:  Acute onset dizziness.  Possible stroke.  Acute but ill-defined  vascular disease.  Hypertension.  MRI HEAD WITHOUT CONTRAST MRA HEAD WITHOUT CONTRAST  Technique:  Multiplanar, multiecho pulse sequences of the brain and surrounding structures were obtained without intravenous contrast. Angiographic images of the head were obtained using MRA technique without contrast.  Comparison:  Head CT 11/23/2011.  MRI 02/22/2010.  MRI HEAD  Findings:  Diffusion imaging does not show any acute or subacute infarction.  The brain shows generalized atrophy.  There is extensive chronic small vessel disease throughout the cerebral hemispheric white matter.  There are a few old lacunar insults within the basal ganglia and thalami.  No cortical or large vessel territory infarction.  No mass lesion, hemorrhage, hydrocephalus or extra-axial collection.  No pituitary mass.  Sinuses, middle ears and mastoids are clear.  IMPRESSION: No acute finding.  Atrophy and extensive chronic small vessel disease throughout the cerebral hemispheric  white matter.  MRA HEAD  Findings: Both internal carotid arteries are widely patent into the brain.  The anterior and middle cerebral vessels are patent without proximal stenosis, aneurysm or vascular malformation.  Both vertebral arteries are patent to the basilar.  No basilar stenosis. Posterior circulation branch vessels are normal.  Both posterior cerebral arteries take a fetal origin from the anterior circulation.  IMPRESSION: No large or medium vessel occlusion or correctable proximal stenosis.   Original Report Authenticated By: Thomasenia Sales, M.D.     Micro Results   CBC w Diff: Lab Results  Component Value Date   WBC 5.9 11/24/2011   HGB 13.2 11/24/2011   HCT 37.5 11/24/2011   PLT 173 11/24/2011   LYMPHOPCT 18 11/24/2011   MONOPCT 11 11/24/2011   EOSPCT 2 11/24/2011   BASOPCT 0 11/24/2011    CMP: Lab Results  Component Value Date   NA 129* 11/24/2011   K 4.0 11/24/2011   CL 95* 11/24/2011   CO2 25 11/24/2011   BUN 13 11/24/2011   CREATININE 0.62 11/24/2011   PROT 7.6 11/23/2011   ALBUMIN 3.9 11/23/2011   BILITOT 0.6 11/23/2011   ALKPHOS 51 11/23/2011   AST 26 11/23/2011   ALT 15 11/23/2011  . Lab Results  Component Value Date   HGBA1C 6.1* 11/24/2011    Lab Results  Component Value Date   CHOL 168 11/24/2011   HDL 86 11/24/2011   LDLCALC 75 11/24/2011   TRIG 33 11/24/2011   CHOLHDL 2.0 11/24/2011     Discharge Instructions     Follow with Primary MD Rene Paci, MD in 3 days   Get CBC, CMP, checked 3 days by Primary MD and again as instructed by your Primary MD.    Get Medicines reviewed and adjusted.  Please request your Prim.MD to go over all Hospital Tests and Procedure/Radiological results at the follow up, please get all Hospital records sent to your Prim MD by signing hospital release before you go home.  Activity: As tolerated with Full fall precautions use walker/cane & assistance as needed   Diet:  Heart healthy low  carbohydrate  For Heart failure patients - Check your Weight same time everyday, if you gain over 2 pounds, or you develop in leg swelling, experience more shortness of breath or chest pain, call your Primary MD immediately. Follow Cardiac Low Salt Diet and 1.8 lit/day fluid restriction.  Disposition Home   If you experience worsening of your admission symptoms, develop shortness of breath, life threatening emergency, suicidal or homicidal thoughts you must seek medical attention immediately by calling 911 or calling your MD  immediately  if symptoms less severe.  You Must read complete instructions/literature along with all the possible adverse reactions/side effects for all the Medicines you take and that have been prescribed to you. Take any new Medicines after you have completely understood and accpet all the possible adverse reactions/side effects.   Do not drive and provide baby sitting services if your were admitted for syncope or siezures until you have seen by Primary MD or a Neurologist and advised to do so again.  Do not drive when taking Pain medications.    Do not take more than prescribed Pain, Sleep and Anxiety Medications  Special Instructions: If you have smoked or chewed Tobacco  in the last 2 yrs please stop smoking, stop any regular Alcohol  and or any Recreational drug use.  Wear Seat belts while driving.  Transient Ischemic Attack A transient ischemic attack (TIA) is a "warning stroke" that causes stroke-like symptoms. Unlike a stroke, a TIA does not cause permanent damage to the brain. The symptoms of a TIA can happen very fast and do not last long. It is important to know the symptoms of a TIA and what to do. This can help prevent a major stroke or death. CAUSES   A TIA is caused by a temporary blockage in an artery in the brain or neck (carotid artery). The blockage does not allow the brain to get the blood supply it needs and can cause different symptoms. The  blockage can be caused by either:  A blood clot.  Fatty buildup (plaque) in a neck or brain artery. SYMPTOMS  TIA symptoms are the same as a stroke but are temporary. Symptoms can include sudden:  Numbness or weakness on one side of the body. Especially to the:  Face.  Arm.  Leg.  Trouble speaking, thinking, or confusion.  Change in vision, such as trouble seeing in one or both eyes.  Dizziness, loss of balance, or difficulty walking.  Severe headache. ANY OF THESE SYMPTOMS MAY REPRESENT A SERIOUS PROBLEM THAT IS AN EMERGENCY. Do not wait to see if the symptoms will go away. Get medical help at once. Call your local emergency services (911 in [place] IMMEDIATELY. DO NOT drive yourself to the hospital. RISK FACTORS Risk factors can increase the risk of developing a TIA. These can include.   High blood pressure (hypertension).  High cholesterol (hyperlipidemia).  Heart disease (atherosclerosis).  Smoking.  Diabetes.  Abnormal heart rhythm (atrial fibrillation).  Family history of a stroke or heart attack.  Use of oral contraceptives (especially when combined with smoking). DIAGNOSIS   A TIA can be diagnosed based on your:  Symptoms.  History.  Risk factors.  Tests that can help diagnose the symptoms of a TIA include:  CT or MRI scan. These tests can provide detailed images of the brain.  Carotid ultrasound. This test looks to see if there are blockages in the carotid arteries of your neck.  Arteriography. A thin, small flexible tube (catheter) is inserted through a small cut (incision) in your groin. The catheter is threaded to your carotid or vertebral artery. A dye is then injected into the catheter. The dye highlights the arteries in your brain and allows your caregiver to look for narrowing or blockages that can cause a TIA. TREATMENT  Based on the cause of a TIA, treatment options can vary. Treatment is important to help prevent a stroke. Treatment options  can include:  Medication. Such as:  Clot-busting medicine.  Anti-platelet medicine.  Blood  pressure medicine.  Blood thinner medicine.  Surgery:  Carotid endarterectomy. The carotid arteries are the arteries that supply the head and neck with oxygenated blood. This surgery can help remove fatty deposits (plaque) in the carotid arteries.  Angioplasty and stenting. This surgery uses a balloon to dilate a blocked artery in the brain. A stent is a small, metal mesh tube that can help keep an artery open HOME CARE INSTRUCTIONS   It is important to take all medicine as told by your caregiver. If the medicine has side effects that affect you negatively, tell your caregiver right away. Do not stop taking medicine unless told by your caregiver. Some medicines may need to be changed to better treat your condition.  Do not smoke. Talk to your caregiver on how to quit smoking.  Eat a diet high in fruits, vegetables and lean meat. Avoid a high fat, high salt diet. A dietician can you help you make healthy food choices.  Maintain a healthy weight. Develop an exercise plan approved by your caregiver. SEEK IMMEDIATE MEDICAL CARE IF:   You develop weakness or numbness on one side of your body.  You have problems thinking, speaking, or feel confused.  You have vision changes.  You feel dizzy, have trouble walking, or lose your balance.  You develop a severe headache. MAKE SURE YOU:   Understand these instructions.  Will watch your condition.  Will get help right away if you are not doing well or get worse. Document Released: 10/30/2004 Document Revised: 04/14/2011 Document Reviewed: 03/15/2009 [facility] Patient Information 2013 [place], [place].  Follow-up Information    Follow up with Rene Paci, MD. Schedule an appointment as soon as possible for a visit in 3 days.   Contact information:   [address] [place] [address] [phone]        Follow up with Gates Rigg, MD. Schedule an appointment as soon as possible for a visit in 2 weeks.   Contact information:   [address] [facility] [place] [address] [phone]            Discharge Medications     Medication List     As of 11/25/2011  8:28 AM    CONTINUE taking these medications         ABILIFY 2 MG tablet   Generic drug: ARIPiprazole      alendronate 70 MG tablet   Commonly known as: FOSAMAX      aspirin EC 81 MG tablet      atenolol 50 MG tablet   Commonly known as: TENORMIN   Take 1.5 tablets (75 mg total) by mouth daily.      calcium gluconate 500 MG tablet      levothyroxine 50 MCG tablet   Commonly known as: SYNTHROID, LEVOTHROID      lisinopril 40 MG tablet   Commonly known as: PRINIVIL,ZESTRIL      LORazepam 0.5 MG tablet   Commonly known as: ATIVAN      mercaptopurine 50 MG tablet   Commonly known as: PURINETHOL   Take 0.5 tablets (25 mg total) by mouth daily. Take 1/2 by mouth daily      VIIBRYD 20 MG Tabs   Generic drug: Vilazodone HCl      Vitamin D3 2000 UNITS Tabs      STOP taking these medications         hydrochlorothiazide 12.5 MG capsule   Commonly known  as: MICROZIDE           Total Time in preparing paper work, data evaluation and todays exam - 35 minutes  Leroy Sea M.D on 11/25/2011 at 8:28 AM  [facility] Office  [phone]

## 2011-11-25 NOTE — Progress Notes (Signed)
Pt. Discharged to home accompanied by son via private vehicle.  Prescriptions reviewed with patient and again with son as she will require much reinforcement.  Escorted to exit via wheelchair by nurse tech.

## 2011-11-25 NOTE — Progress Notes (Addendum)
   CARE MANAGEMENT NOTE 11/25/2011  Patient:  Marisa Smith, Marisa Smith   Account Number:  [id number]  Date Initiated:  11/25/2011  Documentation initiated by:  [facility]  Subjective/Objective Assessment:   TIA, dizziness, slurred speech     Action/Plan:   lives at home with husband, and dtr assist with care   Anticipated DC Date:  11/25/2011   Anticipated DC Plan:  HOME W HOME HEALTH SERVICES      DC Planning Services  CM consult      [facility] Choice  HOME HEALTH   Choice offered to / List presented to:  C-1 Patient        HH arranged  HH-2 PT  HH-5 SPEECH THERAPY      Status of service:  Completed, signed off Medicare Important Message given?   (If response is "NO", the following Medicare IM given date fields will be blank) Date Medicare IM given:   Date Additional Medicare IM given:    Discharge Disposition:  HOME W HOME HEALTH SERVICES  Per UR Regulation:    If discussed at Long Length of Stay Meetings, dates discussed:    Comments: 11/25/2011 1150 NCM spoke to pt's dtr and states she does want her mother to have PT/SLP at home. Explained list and NCM business card was left in room to review when she arrives. States she will give NCM a call with her decision. Isidoro Donning RN CCM Case Mgmt phone [phone]   11/25/2011 1130 NCM spoke to pt and states she was independent prior to hospital stay. She does not have any DME at home was ambulating without any DME. She is refusing HH PT and SLP. NCM explained the importance of HH to assist with gait and strength training. Explained HH PT will complete HSE. Pt is refusing HH at this time. She will discuss with daughter about need for [facility]. NCM gave pt business card and list of [facility] agency for [facility]. Isidoro Donning RN CCM Case Mgmt phone [phone]

## 2011-11-25 NOTE — Progress Notes (Signed)
Physical Therapy Treatment Patient Details Name: Marisa Smith MRN: [id number] DOB: 1924/02/08 Today's Date: 11/25/2011 Time: 7829-5621 PT Time Calculation (min): 36 min  PT Assessment / Plan / Recommendation Comments on Treatment Session  Pt refusing HHPT per SW.  Pt states her daughter feels like she should allow HHPT to f/u.  Discussed PT recommendations for f/u HHPT and benefits.  Pt reports h/o depression and states that she does not feel like her current medication is addressing the issue.  Reports daughter is aware of depression and has a f/u appointment concerning depression.      Follow Up Recommendations  Home health PT;Supervision - Intermittent     Does the patient have the potential to tolerate intense rehabilitation     Barriers to Discharge        Equipment Recommendations  None recommended by PT    Recommendations for Other Services    Frequency Min 4X/week   Plan Discharge plan remains appropriate;Frequency remains appropriate    Precautions / Restrictions Precautions Precautions: Fall Restrictions Weight Bearing Restrictions: No   Pertinent Vitals/Pain No c/o pain.    Mobility  Bed Mobility Bed Mobility: Supine to Sit;Sitting - Scoot to Edge of Bed;Sit to Supine Supine to Sit: 6: Modified independent (Device/Increase time);HOB flat;With rails Sitting - Scoot to Edge of Bed: 6: Modified independent (Device/Increase time);With rail Sit to Supine: 6: Modified independent (Device/Increase time);HOB flat Details for Bed Mobility Assistance: No cues or assist needed. Transfers Transfers: Sit to Stand;Stand to Sit Sit to Stand: 5: Supervision;With upper extremity assist;From bed Stand to Sit: 5: Supervision;With upper extremity assist;With armrests;To chair/3-in-1 Details for Transfer Assistance: Verbal cues for hand placement. Ambulation/Gait Ambulation/Gait Assistance: 4: Min guard Ambulation Distance (Feet): 250 Feet Assistive device: None Ambulation/Gait  Assistance Details: Pt with initial LOB x2 with ambulation but recovered without assist.  Gait improved with increased distance. Gait Pattern: Step-through pattern;Decreased stride length;Shuffle;Trunk flexed Gait velocity: Slow gait speed Stairs: No Modified Rankin (Stroke Patients Only) Pre-Morbid Rankin Score: No symptoms Modified Rankin: No significant disability        PT Goals Acute Rehab PT Goals PT Goal: Sit to Stand - Progress: Progressing toward goal PT Goal: Stand to Sit - Progress: Progressing toward goal PT Goal: Ambulate - Progress: Progressing toward goal  Visit Information  Last PT Received On: 11/25/11 Assistance Needed: +1    Subjective Data  Subjective: "My daughter thinks I should." HY:QMVH   Cognition  Overall Cognitive Status: Appears within functional limits for tasks assessed/performed Arousal/Alertness: Awake/alert Orientation Level: Appears intact for tasks assessed Behavior During Session: Flat affect    Balance  Balance Balance Assessed: Yes Static Standing Balance Static Standing - Balance Support: No upper extremity supported Static Standing - Level of Assistance: 5: Stand by assistance Static Standing - Comment/# of Minutes: 3 Dynamic Standing Balance Dynamic Standing - Balance Support: No upper extremity supported Dynamic Standing - Level of Assistance: 5: Stand by assistance Dynamic Standing - Balance Activities: Forward lean/weight shifting;Other (comment) (Reaching to floor) Dynamic Standing - Comments: Able to pick object off floor with supervision  End of Session PT - End of Session Equipment Utilized During Treatment: Gait belt Activity Tolerance: Patient tolerated treatment well Patient left: in bed;with call bell/phone within reach;with bed alarm set Nurse Communication: Mobility status       Newell Coral 11/25/2011, 2:23 PM  Newell Coral, PTA Acute Rehab [phone] (office)

## 2011-11-26 ENCOUNTER — Telehealth: Payer: Self-pay | Admitting: Internal Medicine

## 2011-11-26 NOTE — Telephone Encounter (Signed)
Caller: [name]; Patient Name: Marisa Smith; PCP: Rene Paci (Adults only); Best Callback Phone Number: [phone]; Call regarding: Hypertension; onset 11/26/11; BP 195/95; was hospitalized a wk ago for hypertension; f/u OV 11/28/11; slight dizziness and HA; has taken her Lisinopril and Atenolol today; All emergent sxs of Hypertension, Diagnosed or Suspected protocol r/o except "systolic BP of more than 180 mmHg or diastolic BP of more than "; disp see within 4 hrs; instructed to use [facility]

## 2011-11-27 NOTE — Telephone Encounter (Signed)
Noted thanks °

## 2011-11-28 ENCOUNTER — Encounter: Payer: Self-pay | Admitting: Internal Medicine

## 2011-11-28 ENCOUNTER — Ambulatory Visit (INDEPENDENT_AMBULATORY_CARE_PROVIDER_SITE_OTHER): Payer: Medicare Other | Admitting: Internal Medicine

## 2011-11-28 ENCOUNTER — Other Ambulatory Visit (INDEPENDENT_AMBULATORY_CARE_PROVIDER_SITE_OTHER): Payer: Medicare Other

## 2011-11-28 VITALS — BP 138/68 | HR 50 | Temp 97.6°F | Ht 66.0 in | Wt 106.1 lb

## 2011-11-28 DIAGNOSIS — I10 Essential (primary) hypertension: Secondary | ICD-10-CM

## 2011-11-28 DIAGNOSIS — E871 Hypo-osmolality and hyponatremia: Secondary | ICD-10-CM

## 2011-11-28 DIAGNOSIS — R7309 Other abnormal glucose: Secondary | ICD-10-CM

## 2011-11-28 DIAGNOSIS — R4701 Aphasia: Secondary | ICD-10-CM

## 2011-11-28 DIAGNOSIS — R739 Hyperglycemia, unspecified: Secondary | ICD-10-CM

## 2011-11-28 LAB — BASIC METABOLIC PANEL
Calcium: 9.4 mg/dL (ref 8.4–10.5)
Creatinine, Ser: 0.8 mg/dL (ref 0.4–1.2)
GFR: 75.66 mL/min (ref >60.00)
Glucose, Bld: 95 mg/dL (ref 70–99)
Sodium: 129 mEq/L — ABNORMAL LOW (ref 135–145)

## 2011-11-28 LAB — METANEPHRINES, PLASMA
Normetanephrine, Free: 78 pg/mL (ref <=148)
Total Metanephrines-Plasma: 78 pg/mL (ref <=205)

## 2011-11-28 NOTE — Assessment & Plan Note (Signed)
?  TIA event - 11/2011 hospitalization reviewed Also ?med effect - working with Dr Donell Beers on same Speech at baseline

## 2011-11-28 NOTE — Patient Instructions (Signed)
It was good to see you today. We have reviewed your hospital records including labs and tests today Test(s) ordered today. Your results will be called to you after review (48-72hours after test completion). If any changes need to be made, you will be notified at that time. Medications reviewed, no changes at this time. we'll make referral to nutritionist. Our office will contact you regarding appointment(s) once made. Please schedule followup in 3 months, call sooner if problems.

## 2011-11-28 NOTE — Assessment & Plan Note (Signed)
Incidental hyperglycemia 11/2011 hospitalization but normal a1c Refer to nutritionist at pt request Reassurance provided but will continue to monitor  Lab Results  Component Value Date   HGBA1C 6.1* 11/24/2011

## 2011-11-28 NOTE — Assessment & Plan Note (Signed)
Labile but overall controlled - No longer on prn hctz due to hyponatremia (stopped 11/2011 hosp) s/p 24h urine 11/2011 hospitalization: neg for pheo - Will monitor and defer other changes at this time Reassurance provided  BP Readings from Last 3 Encounters:  11/28/11 138/68  11/25/11 140/48  10/03/11 142/60

## 2011-11-28 NOTE — Progress Notes (Signed)
  Subjective:    Patient ID: Marisa Smith, female    DOB: [birth year], 78 y.o.   MRN: [id number]  HPI  Here for hospital follow up  History of present illness and  Hospital Course:  See H&P, Labs, Consult and Test reports for all details in brief, patient was admitted for expressive aphagia likely due to TIA versus severe dehydration caused by taking diuretic, when patient was admitted her sodium was 121, she was given IV fluids and her diuretic was held with good results. Her expressive if easier has completely resolved. She has no focal neurological deficits, she underwent MRI MRA of the head which were unremarkable, A1c was borderline at 6.1, LDL was under 100, her echogram and carotid duplex were unremarkable, she will be continued on her home dose 81 mg of aspirin with close followup with primary care physician and neurologist as outpatient. She has been suggested low carbohydrate diet since she is at high risk for developing diabetes according to her A1c. discharge Diagnosis Hyponatremia [276.1]  Hypertension [401.9]  Hyperglycemia [790.29]  Expressive aphasia [784.3]  Stroke-like episode [434.91]  Expressive aphasia   Past Medical History  Diagnosis Date  . Unspecified vitamin D deficiency   . Headache 02/15/2010  . ALLERGIC RHINITIS CAUSE UNSPECIFIED   . ANXIETY   . DEPRESSION, MAJOR, MODERATE   . HYPERTENSION, BENIGN ESSENTIAL   . HYPOTHYROIDISM   . OSTEOPOROSIS   . Autoimmune hepatitis     on MP6, prev pred    Review of Systems  Constitutional: Negative for fever and fatigue.  HENT: Negative for ear pain, neck pain, tinnitus and ear discharge.   Eyes: Negative for pain and visual disturbance.  Neurological: Negative for dizziness and headaches.       Objective:   Physical Exam  BP 138/68  Pulse 50  Temp 97.6 F (36.4 C) (Oral)  Ht 5\' 6"  (1.676 m)  Wt 106 lb 1.9 oz (48.136 kg)  BMI 17.13 kg/m2  SpO2 99% Wt Readings from Last 3 Encounters:  11/28/11 106 lb 1.9 oz  (48.136 kg)  11/24/11 109 lb 14.4 oz (49.85 kg)  10/03/11 107 lb 6.4 oz (48.716 kg)   Constitutional: She appears well-developed and well-nourished. No distress. son in law at side Neck: Normal range of motion. Neck supple. No JVD present. No thyromegaly present.  Cardiovascular: Normal rate, regular rhythm and normal heart sounds.  No murmur heard. No BLE edema. Pulmonary/Chest: Effort normal and breath sounds normal. No respiratory distress. She has no wheezes.  Psychiatric: She has a very anxious/depressed mood and affect. Her behavior is normal. Judgment and thought content normal.  Neurologic: Awake alert oriented x3. Slow speech but no aphasia. Cognition and recall intact. Moves all extremities well, coordination and balance normal gait slowed, unaided  Lab Results  Component Value Date   WBC 5.9 11/24/2011   HGB 13.2 11/24/2011   HCT 37.5 11/24/2011   PLT 173 11/24/2011   GLUCOSE 124* 11/24/2011   CHOL 168 11/24/2011   TRIG 33 11/24/2011   HDL 86 11/24/2011   LDLCALC 75 11/24/2011   ALT 15 11/23/2011   AST 26 11/23/2011   NA 129* 11/24/2011   K 4.0 11/24/2011   CL 95* 11/24/2011   CREATININE 0.62 11/24/2011   BUN 13 11/24/2011   CO2 25 11/24/2011   TSH 3.387 11/24/2011   HGBA1C 6.1* 11/24/2011       Assessment & Plan:   See problem list. Medications and labs reviewed today.

## 2011-11-28 NOTE — Assessment & Plan Note (Signed)
Noted during October 21 hospitalization for transient expressive aphasia, felt related to diuretic therapy Recheck now as off HCTZ

## 2011-12-01 ENCOUNTER — Encounter (HOSPITAL_COMMUNITY): Payer: Self-pay | Admitting: *Deleted

## 2011-12-01 ENCOUNTER — Emergency Department (HOSPITAL_COMMUNITY)
Admission: EM | Admit: 2011-12-01 | Discharge: 2011-12-01 | Disposition: A | Discharge status: Home / Self Care | Payer: Medicare Other | Source: Home / Self Care | Attending: Family Medicine | Admitting: Family Medicine

## 2011-12-01 DIAGNOSIS — I10 Essential (primary) hypertension: Secondary | ICD-10-CM

## 2011-12-01 DIAGNOSIS — F341 Dysthymic disorder: Secondary | ICD-10-CM

## 2011-12-01 DIAGNOSIS — F418 Other specified anxiety disorders: Secondary | ICD-10-CM

## 2011-12-01 NOTE — ED Notes (Signed)
Pt  Is  Depressed       Her   Blood  Pressure     Is elevated       Today  Saw  Her  pcp   3  Days  Ago         Pt      Released  From      6  Days  Ago      For  Mini  Stroke

## 2011-12-01 NOTE — ED Provider Notes (Signed)
History     CSN: [id number]  Arrival date & time 12/01/11  1307   First MD Initiated Contact with Patient 12/01/11 1322      Chief Complaint  Patient presents with  . Depression    (Consider location/radiation/quality/duration/timing/severity/associated sxs/prior treatment) Patient is a 78 y.o. female presenting with hypertension. The history is provided by the patient and a relative.  Hypertension This is a chronic problem. The current episode started 3 to 5 hours ago (bp found to be elevated and brought for eval even though can be seen by lmd at 3:30 today.). The problem has not changed since onset.Pertinent negatives include no chest pain, no abdominal pain, no headaches and no shortness of breath. Associated symptoms comments: Recent hosp and d/c 6 days ago, has appt in am with neurologist.. Nothing aggravates the symptoms.    Past Medical History  Diagnosis Date  . Unspecified vitamin D deficiency   . Headache 02/15/2010  . ALLERGIC RHINITIS CAUSE UNSPECIFIED   . ANXIETY   . DEPRESSION, MAJOR, MODERATE   . HYPERTENSION, BENIGN ESSENTIAL   . HYPOTHYROIDISM   . OSTEOPOROSIS   . Autoimmune hepatitis     on MP6, prev pred    Past Surgical History  Procedure Date  . Cataract extraction 2008    Left eye  . Cataract extraction 2009    Right eye  . Tonsillectomy 1945    Family History  Problem Relation Age of Onset  . Hypertension Mother   . Stroke Mother   . Heart disease Father   . Stroke Sister     History  Substance Use Topics  . Smoking status: Never Smoker   . Smokeless tobacco: Never Used   Comment: Married, Retired- daughter is Okey Regal Hilmer-Fifield responsible for majority of supervison for parents  . Alcohol Use: No    OB History    Grav Para Term Preterm Abortions TAB SAB Ect Mult Living                  Review of Systems  Constitutional: Negative.   HENT: Negative.   Respiratory: Negative.  Negative for shortness of breath.   Cardiovascular:  Negative for chest pain.  Gastrointestinal: Negative.  Negative for abdominal pain.  Neurological: Negative.  Negative for headaches.    Allergies  Amlodipine besylate; Azathioprine; Penicillins; Sertraline hcl; and Sulfonamide derivatives  Home Medications   Current Outpatient Rx  Name Route Sig Dispense Refill  . ABILIFY 2 MG PO TABS  Take 1 by mouth daily    . ALENDRONATE SODIUM 70 MG PO TABS Oral Take 70 mg by mouth every 7 (seven) days. On Saturdays; Take with a full glass of water on an empty stomach.    . ASPIRIN EC 81 MG PO TBEC Oral Take 81 mg by mouth daily as needed.     . ATENOLOL 50 MG PO TABS Oral Take 1.5 tablets (75 mg total) by mouth daily. 45 tablet 5  . CALCIUM GLUCONATE 500 MG PO TABS Oral Take 500 mg by mouth daily.      [name] Kitchen VITAMIN D3 2000 UNITS PO TABS Oral Take 2,000 Units by mouth daily.      [name] Kitchen LEVOTHYROXINE SODIUM 50 MCG PO TABS Oral Take 50 mcg by mouth daily.    [name] Kitchen LISINOPRIL 40 MG PO TABS Oral Take 40 mg by mouth daily.    [name] Kitchen LORAZEPAM 0.5 MG PO TABS Oral Take 0.5 mg by mouth daily.     . MERCAPTOPURINE 50 MG PO  TABS Oral Take 0.5 tablets (25 mg total) by mouth daily. Take 1/2 by mouth daily 20 tablet 11  . VIIBRYD 20 MG PO TABS Oral Take 20 mg by mouth daily.       BP 168/78  Pulse 47  Temp 97.7 F (36.5 C) (Oral)  Resp 18  SpO2 100%  Physical Exam  Nursing note and vitals reviewed. Constitutional: She is oriented to person, place, and time. She appears well-developed and well-nourished. No distress.  HENT:  Head: Normocephalic.  Eyes: Conjunctivae normal are normal. Pupils are equal, round, and reactive to light.  Neck: Normal range of motion. Neck supple.  Cardiovascular: Normal rate, regular rhythm, normal heart sounds and intact distal pulses.   Pulmonary/Chest: Breath sounds normal.  Musculoskeletal: She exhibits no edema.  Neurological: She is alert and oriented to person, place, and time.  Skin: Skin is warm and dry.  Psychiatric: She has a  normal mood and affect.    ED Course  Procedures (including critical care time)  Labs Reviewed - No data to display No results found.   1. Hypertension, benign   2. Depression with anxiety       MDM          Linna Hoff, MD 12/02/11 [phone]

## 2011-12-02 NOTE — ED Notes (Signed)
No  Isolation  requirements

## 2011-12-03 ENCOUNTER — Telehealth: Payer: Self-pay

## 2011-12-03 NOTE — Telephone Encounter (Signed)
Noted - thanks Please have HH reschedule and try again- thanks

## 2011-12-03 NOTE — Telephone Encounter (Signed)
HHPT called to inform MD that pt declined visit this week stating she is overwhelmed with several doctors appointments.

## 2011-12-04 ENCOUNTER — Encounter: Payer: Self-pay | Admitting: Internal Medicine

## 2011-12-04 ENCOUNTER — Ambulatory Visit (INDEPENDENT_AMBULATORY_CARE_PROVIDER_SITE_OTHER): Payer: Medicare Other | Admitting: Internal Medicine

## 2011-12-04 VITALS — BP 150/58 | HR 62 | Temp 98.3°F | Ht 66.0 in | Wt 106.1 lb

## 2011-12-04 DIAGNOSIS — F411 Generalized anxiety disorder: Secondary | ICD-10-CM

## 2011-12-04 DIAGNOSIS — H01009 Unspecified blepharitis unspecified eye, unspecified eyelid: Secondary | ICD-10-CM

## 2011-12-04 DIAGNOSIS — R001 Bradycardia, unspecified: Secondary | ICD-10-CM

## 2011-12-04 DIAGNOSIS — I498 Other specified cardiac arrhythmias: Secondary | ICD-10-CM

## 2011-12-04 DIAGNOSIS — I10 Essential (primary) hypertension: Secondary | ICD-10-CM

## 2011-12-04 MED ORDER — ERYTHROMYCIN 5 MG/GM OP OINT: TOPICAL_OINTMENT | Freq: Four times a day (QID) | OPHTHALMIC | Status: DC

## 2011-12-04 MED ORDER — OLMESARTAN MEDOXOMIL 40 MG PO TABS: 40.0000 mg | ORAL_TABLET | Freq: Every day | ORAL | Status: DC

## 2011-12-04 NOTE — Patient Instructions (Addendum)
Unfortunately due to the low heart rate and "staggering" we need to stop the atenolol (tenormin) Please stop the lisinopril as well for now You are given the Benicar 40 mg samples to take 1 per day instead of the lisinopril You also have the Erythromycin ointment to use for the eyelid infection You should also consider allegra OTC - 1 per day , in case the eye watering is related to allergies Continue all other medications as before Please keep your appointments with your specialists as you have planned - Dr Pearlean Brownie Please make appt with Dr Felicity Coyer for 2 weeks, and please continue to monitor your Blood Pressure as you can at home

## 2011-12-06 ENCOUNTER — Encounter: Payer: Self-pay | Admitting: Internal Medicine

## 2011-12-06 DIAGNOSIS — R001 Bradycardia, unspecified: Secondary | ICD-10-CM | POA: Insufficient documentation

## 2011-12-06 NOTE — Assessment & Plan Note (Signed)
stable overall by hx and exam, most recent data reviewed with pt, and pt to continue medical treatment as before BP Readings from Last 3 Encounters:  12/04/11 150/58  12/01/11 168/78  11/28/11 138/68   For change lisiopril to benicar (gave samples), may need to change to higher dose irbesartan if son checkes on cost and is too expensive,  To f/u PCP 2 wks, consider f/u renal labs

## 2011-12-06 NOTE — Progress Notes (Signed)
Subjective:    Patient ID: Marisa Smith, female    DOB: 1924-07-31, 78 y.o.   MRN: [id number]  HPI  Here to f/u with son, pt with recent TIA, today noted elev BP 190/86 and HR 44 at optho visit this am, pt without complaint;  Pt denies chest pain, increased sob or doe, wheezing, orthopnea, PND, increased LE swelling, palpitations, dizziness or syncope.  Pt denies new neurological symptoms such as new headache, or facial or extremity weakness or numbness   Pt denies polydipsia, polyuria.   Pt denies fever, wt loss, night sweats, loss of appetite, or other constitutional symptoms  No other acute complaints  Denies worsening depressive symptoms, suicidal ideation, or panic, though has ongoing anxiety.  Also noted is small area red/tender to right medial lower eyelid, as well as bilat somewhat weepy eyes for several days Past Medical History  Diagnosis Date  . Unspecified vitamin D deficiency   . Headache 02/15/2010  . ALLERGIC RHINITIS CAUSE UNSPECIFIED   . ANXIETY   . DEPRESSION, MAJOR, MODERATE   . HYPERTENSION, BENIGN ESSENTIAL   . HYPOTHYROIDISM   . OSTEOPOROSIS   . Autoimmune hepatitis     on MP6, prev pred   Past Surgical History  Procedure Date  . Cataract extraction 2008    Left eye  . Cataract extraction 2009    Right eye  . Tonsillectomy 1945    reports that she has never smoked. She has never used smokeless tobacco. She reports that she does not drink alcohol or use illicit drugs. family history includes Heart disease in her father; Hypertension in her mother; and Stroke in her mother and sister. Allergies  Allergen Reactions  . Amlodipine Besylate     REACTION: itching  . Azathioprine     REACTION: nausea  . Penicillins     REACTION: yeast infection  . Sertraline Hcl     REACTION: rash  . Sulfonamide Derivatives     REACTION: swelling and itching   Current Outpatient Prescriptions on File Prior to Visit  Medication Sig Dispense Refill  . ABILIFY 2 MG tablet Take 1 by  mouth daily      . alendronate (FOSAMAX) 70 MG tablet Take 70 mg by mouth every 7 (seven) days. On Saturdays; Take with a full glass of water on an empty stomach.      [name] Kitchen aspirin EC 81 MG EC tablet Take 81 mg by mouth daily as needed.       . calcium gluconate 500 MG tablet Take 500 mg by mouth daily.        . Cholecalciferol (VITAMIN D3) 2000 UNITS TABS Take 2,000 Units by mouth daily.        [name] Kitchen levothyroxine (SYNTHROID, LEVOTHROID) 50 MCG tablet Take 50 mcg by mouth daily.      [name] Kitchen lisinopril (PRINIVIL,ZESTRIL) 40 MG tablet Take 40 mg by mouth daily.      [name] Kitchen LORazepam (ATIVAN) 0.5 MG tablet Take 0.5 mg by mouth daily.       . mercaptopurine (PURINETHOL) 50 MG tablet Take 0.5 tablets (25 mg total) by mouth daily. Take 1/2 by mouth daily  20 tablet  11  . VIIBRYD 20 MG TABS Take 20 mg by mouth daily.       [name] Kitchen olmesartan (BENICAR) 40 MG tablet Take 1 tablet (40 mg total) by mouth daily.  90 tablet  3   Review of Systems  Constitutional: Negative for diaphoresis and unexpected weight change.  HENT: Negative for tinnitus.  Eyes: Negative for photophobia and visual disturbance.  Respiratory: Negative for choking and stridor.   Gastrointestinal: Negative for vomiting and blood in stool.  Genitourinary: Negative for hematuria and decreased urine volume.  Musculoskeletal: Negative for gait problem.  Skin: Negative for color change and wound.  Neurological: Negative for tremors and numbness.  Psychiatric/Behavioral: Negative for decreased concentration. The patient is not hyperactive.       Objective:   Physical Exam BP 150/58  Pulse 62  Temp 98.3 F (36.8 C) (Oral)  Ht 5\' 6"  (1.676 m)  Wt 106 lb 2 oz (48.138 kg)  BMI 17.13 kg/m2  SpO2 96% Physical Exam  VS noted, elderly, frail Constitutional: Pt appears thin.  HENT: Head: Normocephalic.  Right Ear: External ear normal.  Left Ear: External ear normal.  Eyes: Conjunctivae and EOM are normal. Pupils are equal, round, and reactive to light.    Neck: Normal range of motion. Neck supple.  Cardiovascular: Normal rate and regular rhythm.   Pulmonary/Chest: Effort normal and breath sounds normal.  Abd:  Soft, NT, non-distended, + BS Neurological: Pt is alert. Is at baseline confused  Skin: Skin is warm. No erythema.  Psychiatric: Pt behavior is normal. Not agitated. , mild nervous today    Assessment & Plan:

## 2011-12-06 NOTE — Assessment & Plan Note (Signed)
Noted this am at optho visit, was not noted at recent 3 day hospn for TIA, pt also with some staggering and fatigue lately - ? Related to beta blocker, will hold on taking atenolol for now,  to f/u any worsening symptoms or concerns

## 2011-12-06 NOTE — Assessment & Plan Note (Signed)
Overall stable overall by hx and exam, , and pt to continue medical treatment as before

## 2011-12-06 NOTE — Assessment & Plan Note (Signed)
Mild to mod, for antibx course,  to f/u any worsening symptoms or concerns 

## 2011-12-15 ENCOUNTER — Telehealth: Payer: Self-pay

## 2011-12-15 NOTE — Telephone Encounter (Signed)
Noted - that is pt pref - ok with me

## 2011-12-15 NOTE — Telephone Encounter (Signed)
HHPT called to inform MD that pt has been delaying services x 3 weeks and [facility] has decided to discontinue attempts to provide service.

## 2011-12-18 ENCOUNTER — Other Ambulatory Visit (INDEPENDENT_AMBULATORY_CARE_PROVIDER_SITE_OTHER): Payer: Medicare Other

## 2011-12-18 ENCOUNTER — Ambulatory Visit (INDEPENDENT_AMBULATORY_CARE_PROVIDER_SITE_OTHER): Payer: Medicare Other | Admitting: Internal Medicine

## 2011-12-18 ENCOUNTER — Encounter: Payer: Self-pay | Admitting: Internal Medicine

## 2011-12-18 VITALS — BP 140/88 | HR 88 | Temp 98.0°F | Ht 66.0 in | Wt 106.0 lb

## 2011-12-18 DIAGNOSIS — E871 Hypo-osmolality and hyponatremia: Secondary | ICD-10-CM

## 2011-12-18 DIAGNOSIS — F321 Major depressive disorder, single episode, moderate: Secondary | ICD-10-CM

## 2011-12-18 DIAGNOSIS — R001 Bradycardia, unspecified: Secondary | ICD-10-CM

## 2011-12-18 DIAGNOSIS — I498 Other specified cardiac arrhythmias: Secondary | ICD-10-CM

## 2011-12-18 DIAGNOSIS — I10 Essential (primary) hypertension: Secondary | ICD-10-CM

## 2011-12-18 LAB — BASIC METABOLIC PANEL
Chloride: 96 mEq/L (ref 96–112)
Creatinine, Ser: 0.7 mg/dL (ref 0.4–1.2)
GFR: 80.45 mL/min (ref >60.00)
Potassium: 4.4 mEq/L (ref 3.5–5.1)

## 2011-12-18 NOTE — Assessment & Plan Note (Signed)
Severe symptoms with anxety overlap wax/wane flares -current med combination reviewed and updated Advised to consider the lorazepam as "BP" control medication - pt agrees to do so and use prn Continue to work with psyc (plovsky) as ongoing, continue efforts at behav therapy The current medical regimen is effective;  continue present plan and medications.

## 2011-12-18 NOTE — Patient Instructions (Addendum)
It was good to see you today. We have reviewed your prior records including labs and tests today Test(s) ordered today. Your results will be released to MyChart (or called to you) after review, usually within 72hours after test completion. If any changes need to be made, you will be notified at that same time. Medications reviewed, no changes at this time. Please keep scheduled followup in 03/2012, call sooner if problems.

## 2011-12-18 NOTE — Assessment & Plan Note (Signed)
Noted 10/31 at eye appt - seen here for same - Stopped atenolol - HR improved Remains asymptomatic

## 2011-12-18 NOTE — Assessment & Plan Note (Signed)
Noted during October 2012 hospitalization for transient expressive aphasia, felt related to diuretic therapy Recheck now  remains off HCTZ -?related to psyc meds (remeron, abilify?) No change proposed as long as mild and asymptomatic

## 2011-12-18 NOTE — Progress Notes (Signed)
  Subjective:    Patient ID: Marisa Smith, female    DOB: [birth year], 78 y.o.   MRN: [id number]  HPI  Here for followup, reviewed interval history Seen 2 weeks ago by my partner for bradycardia -med changes for this reviewed  Past Medical History  Diagnosis Date  . Unspecified vitamin D deficiency   . Headache 02/15/2010  . ALLERGIC RHINITIS CAUSE UNSPECIFIED   . ANXIETY   . DEPRESSION, MAJOR, MODERATE   . HYPERTENSION, BENIGN ESSENTIAL   . HYPOTHYROIDISM   . OSTEOPOROSIS   . Autoimmune hepatitis     on MP6, prev pred   Review of Systems  Respiratory: Negative for cough, chest tightness and shortness of breath.   Cardiovascular: Negative for chest pain, palpitations and leg swelling.  Psychiatric/Behavioral: Positive for dysphoric mood (chronic w/o change). Negative for confusion. The patient is nervous/anxious.        Objective:   Physical Exam BP 140/88  Pulse 88  Temp 98 F (36.7 C) (Oral)  Ht 5\' 6"  (1.676 m)  Wt 106 lb (48.081 kg)  BMI 17.11 kg/m2  SpO2 97% Wt Readings from Last 3 Encounters:  12/18/11 106 lb (48.081 kg)  12/04/11 106 lb 2 oz (48.138 kg)  11/28/11 106 lb 1.9 oz (48.136 kg)   Constitutional: She appears well-developed and well-nourished. No distress. Neck: Normal range of motion. Neck supple. No JVD present. No thyromegaly present.  Cardiovascular: Normal rate, regular rhythm and normal heart sounds.  No murmur heard. No BLE edema. Pulmonary/Chest: Effort normal and breath sounds normal. No respiratory distress. She has no wheezes.  Psychiatric: She has a dysphoric mood and affect. Her behavior is normal. Judgment and thought content normal.   Lab Results  Component Value Date   WBC 5.9 11/24/2011   HGB 13.2 11/24/2011   HCT 37.5 11/24/2011   PLT 173 11/24/2011   GLUCOSE 95 11/28/2011   CHOL 168 11/24/2011   TRIG 33 11/24/2011   HDL 86 11/24/2011   LDLCALC 75 11/24/2011   ALT 15 11/23/2011   AST 26 11/23/2011   NA 129* 11/28/2011   K 4.3  11/28/2011   CL 94* 11/28/2011   CREATININE 0.8 11/28/2011   BUN 18 11/28/2011   CO2 30 11/28/2011   TSH 3.387 11/24/2011   HGBA1C 6.1* 11/24/2011        Assessment & Plan:  See problem list. Medications and labs reviewed today.

## 2011-12-18 NOTE — Assessment & Plan Note (Signed)
10/31/3 meds changed due bradycardia - stopped atenolol and lisinopril Started Benicar - BP good and no symptoms  Check labs due to med change and hx hyponatremia (exac by hctz in past) The current medical regimen is effective;  continue present plan and medications.

## 2011-12-30 ENCOUNTER — Ambulatory Visit (INDEPENDENT_AMBULATORY_CARE_PROVIDER_SITE_OTHER): Payer: Medicare Other | Admitting: Gastroenterology

## 2011-12-30 ENCOUNTER — Other Ambulatory Visit: Payer: Self-pay | Admitting: *Deleted

## 2011-12-30 ENCOUNTER — Encounter: Payer: Self-pay | Admitting: Gastroenterology

## 2011-12-30 ENCOUNTER — Ambulatory Visit: Payer: Medicare Other | Admitting: Gastroenterology

## 2011-12-30 VITALS — BP 152/90 | HR 103 | Ht 66.0 in | Wt 107.0 lb

## 2011-12-30 DIAGNOSIS — K754 Autoimmune hepatitis: Secondary | ICD-10-CM

## 2011-12-30 MED ORDER — MERCAPTOPURINE 50 MG PO TABS: 25.0000 mg | ORAL_TABLET | Freq: Every day | ORAL | Status: DC

## 2011-12-30 MED ORDER — OLMESARTAN MEDOXOMIL 40 MG PO TABS: 40.0000 mg | ORAL_TABLET | Freq: Every day | ORAL | Status: DC

## 2011-12-30 NOTE — Patient Instructions (Addendum)
You will have labs in 6 months you do not need an appointment the lab is open 730 am to 5 pm. Your prescription was sent to the pharmacy.

## 2011-12-30 NOTE — Progress Notes (Signed)
Review of pertinent gastrointestinal problems: 1. Autoimmune hepatitis: diagnosed with AIH 2007 in [facility], she presented with jaundice, nausea. Eventually had liver biopsy. She was on prednisone for about 2 years. She was tried on another med that made her nausea then 6mp. She has been on 6mp for 3-4 years solo therapy.    HPI: This is a  very pleasant 78 year old woman whom I last saw about a year ago. She has had autoimmune hepatitis for 6 years. Under very good control and relatively low-dose immunomodulators.   LFTs last month were all normal.  She feels very well. No jaundice, no itching, no abdominal pains.  She had dehydration last month and was admitted for rehydration.   Past Medical History  Diagnosis Date  . Unspecified vitamin D deficiency   . Headache 02/15/2010  . ALLERGIC RHINITIS CAUSE UNSPECIFIED   . ANXIETY   . DEPRESSION, MAJOR, MODERATE   . HYPERTENSION, BENIGN ESSENTIAL   . HYPOTHYROIDISM   . OSTEOPOROSIS   . Autoimmune hepatitis     on MP6, prev pred    Past Surgical History  Procedure Date  . Cataract extraction 2008    Left eye  . Cataract extraction 2009    Right eye  . Tonsillectomy 1945    Current Outpatient Prescriptions  Medication Sig Dispense Refill  . ABILIFY 2 MG tablet Take 1 by mouth daily      . alendronate (FOSAMAX) 70 MG tablet Take 70 mg by mouth every 7 (seven) days. On Saturdays; Take with a full glass of water on an empty stomach.      [name] Kitchen aspirin EC 81 MG EC tablet Take 81 mg by mouth daily as needed.       . calcium gluconate 500 MG tablet Take 500 mg by mouth daily.        . Cholecalciferol (VITAMIN D3) 2000 UNITS TABS Take 2,000 Units by mouth daily.        [name] Kitchen levothyroxine (SYNTHROID, LEVOTHROID) 50 MCG tablet Take 50 mcg by mouth daily.      [name] Kitchen LORazepam (ATIVAN) 0.5 MG tablet Take 0.5 mg by mouth daily.       . mercaptopurine (PURINETHOL) 50 MG tablet Take 0.5 tablets (25 mg total) by mouth daily. Take 1/2 by mouth  daily  20 tablet  11  . mirtazapine (REMERON) 45 MG tablet Take 45 mg by mouth at bedtime.      [name] Kitchen olmesartan (BENICAR) 40 MG tablet Take 1 tablet (40 mg total) by mouth daily.  30 tablet  11  . [DISCONTINUED] olmesartan (BENICAR) 40 MG tablet Take 1 tablet (40 mg total) by mouth daily.  90 tablet  3    Allergies as of 12/30/2011 - Review Complete 12/30/2011  Allergen Reaction Noted  . Amlodipine besylate    . Azathioprine    . Penicillins    . Sertraline hcl  10/25/2009  . Sulfonamide derivatives      Family History  Problem Relation Age of Onset  . Hypertension Mother   . Stroke Mother   . Heart disease Father   . Stroke Sister     History   Social History  . Marital Status: Married    Spouse Name: N/A    Number of Children: 1  . Years of Education: N/A   Occupational History  .     Social History Main Topics  . Smoking status: Never Smoker   . Smokeless tobacco: Never Used     Comment: Married, Retired-  daughter is Okey Regal [place] responsible for majority of supervison for parents  . Alcohol Use: No  . Drug Use: No  . Sexually Active: Not on file   Other Topics Concern  . Not on file   Social History Narrative  . No narrative on file      Physical Exam: BP 152/90  Pulse 103  Ht 5\' 6"  (1.676 m)  Wt 107 lb (48.535 kg)  BMI 17.27 kg/m2  SpO2 98% Constitutional: generally well-appearing Psychiatric: alert and oriented x3 Abdomen: soft, nontender, nondistended, no obvious ascites, no peritoneal signs, normal bowel sounds     Assessment and plan: 78 y.o. female with autoimmune hepatitis  She'll continue on her current dose of 6-MP, repeat CBC and complete metabolic profile in 6 months and she'll return to see me in 12 months.

## 2011-12-30 NOTE — Telephone Encounter (Signed)
Left msg on vm stating needing to speak with nurse. Called pt back she stated Dr. Jonny Ruiz gave samples of Benicar. She is needing rx sent to her pharmacy. Inform pt will send to [facility]...Raechel Chute

## 2012-02-06 ENCOUNTER — Encounter: Payer: Self-pay | Admitting: Internal Medicine

## 2012-02-06 ENCOUNTER — Ambulatory Visit (INDEPENDENT_AMBULATORY_CARE_PROVIDER_SITE_OTHER): Payer: Medicare Other | Admitting: Internal Medicine

## 2012-02-06 VITALS — BP 132/82 | HR 79 | Temp 97.7°F | Ht 66.0 in | Wt 106.4 lb

## 2012-02-06 DIAGNOSIS — R239 Unspecified skin changes: Secondary | ICD-10-CM

## 2012-02-06 DIAGNOSIS — R238 Other skin changes: Secondary | ICD-10-CM

## 2012-02-06 DIAGNOSIS — I10 Essential (primary) hypertension: Secondary | ICD-10-CM

## 2012-02-06 DIAGNOSIS — F321 Major depressive disorder, single episode, moderate: Secondary | ICD-10-CM

## 2012-02-06 MED ORDER — MIRTAZAPINE 45 MG PO TABS: 45.0000 mg | ORAL_TABLET | Freq: Every day | ORAL | Status: DC

## 2012-02-06 NOTE — Assessment & Plan Note (Signed)
Severe symptoms with anxiety overlap wax/wane flares -current med combination reviewed and updated Will stop Nardil as begun 01/30/12 due to side effects - continue Remeron in place of same Also advised to consider the lorazepam as "BP" control medication - Continue to work with psyc (plovsky) as ongoing, continue efforts at behav therapy The current medical regimen is effective;  continue present plan and medications.

## 2012-02-06 NOTE — Progress Notes (Signed)
  Subjective:    Patient ID: Marisa Smith, female    DOB: 06/18/1924, 79 y.o.   MRN: [id number]  HPI   Here for concern for med side effects - started Nardil 12/27: off balance, slurred words  Past Medical History  Diagnosis Date  . Unspecified vitamin D deficiency   . Headache 02/15/2010  . ALLERGIC RHINITIS CAUSE UNSPECIFIED   . ANXIETY   . DEPRESSION, MAJOR, MODERATE   . HYPERTENSION, BENIGN ESSENTIAL   . HYPOTHYROIDISM   . OSTEOPOROSIS   . Autoimmune hepatitis     on MP6, prev pred   Review of Systems  Respiratory: Negative for cough, chest tightness and shortness of breath.   Cardiovascular: Negative for chest pain, palpitations and leg swelling.  Psychiatric/Behavioral: Positive for behavioral problems, dysphoric mood (chronic w/o change) and decreased concentration. Negative for confusion. The patient is not nervous/anxious.        Objective:   Physical Exam  BP 132/82  Pulse 79  Temp 97.7 F (36.5 C) (Oral)  Ht 5\' 6"  (1.676 m)  Wt 106 lb 6.4 oz (48.263 kg)  BMI 17.17 kg/m2  SpO2 98% Wt Readings from Last 3 Encounters:  02/06/12 106 lb 6.4 oz (48.263 kg)  12/30/11 107 lb (48.535 kg)  12/18/11 106 lb (48.081 kg)   Constitutional: She appears well-developed and well-nourished. No distress. Dtr at side Neck: Normal range of motion. Neck supple. No JVD present. No thyromegaly present.  Cardiovascular: Normal rate, regular rhythm and normal heart sounds.  No murmur heard. No BLE edema. Pulmonary/Chest: Effort normal and breath sounds normal. No respiratory distress. She has no wheezes.  Neuro: awake but delayed, oriented x 3 - no ataxia but cautious gait with assist of RWC Skin: 1cmx26mm raised, scaly nodular appearance over L hand extensor surface near wrist over ulnar side Psychiatric: She has a flat and dysphoric mood and affect. Her behavior is normal. Judgment and thought content normal.   Lab Results  Component Value Date   WBC 5.9 11/24/2011   HGB 13.2  11/24/2011   HCT 37.5 11/24/2011   PLT 173 11/24/2011   GLUCOSE 100* 12/18/2011   CHOL 168 11/24/2011   TRIG 33 11/24/2011   HDL 86 11/24/2011   LDLCALC 75 11/24/2011   ALT 15 11/23/2011   AST 26 11/23/2011   NA 133* 12/18/2011   K 4.4 12/18/2011   CL 96 12/18/2011   CREATININE 0.7 12/18/2011   BUN 14 12/18/2011   CO2 29 12/18/2011   TSH 3.387 11/24/2011   HGBA1C 6.1* 11/24/2011        Assessment & Plan:  See problem list. Medications and labs reviewed today.  Skin change extensor surface of R hand - ?BCC appearance - due to size and location, refer to derm

## 2012-02-06 NOTE — Patient Instructions (Signed)
It was good to see you today. We have reviewed your prior records including labs and tests today Medications reviewed and updated - stop Nardil now Continue mirtazapine and lorazepam as reviewed and follow up with Dr Donell Beers and counseling for you and your family we'll make referral to Dr. Terri Piedra / dermatology. Our office will contact you regarding appointment(s) once made.

## 2012-02-06 NOTE — Assessment & Plan Note (Signed)
BP Readings from Last 3 Encounters:  02/06/12 132/82  12/30/11 152/90  12/18/11 140/88   10/31/3 meds changed due bradycardia - stopped atenolol and lisinopril Started Benicar - BP good and no symptoms  hx hyponatremia (exac by hctz in past) The current medical regimen is effective;  continue present plan and medications.

## 2012-02-18 ENCOUNTER — Other Ambulatory Visit: Payer: Self-pay | Admitting: *Deleted

## 2012-02-18 MED ORDER — ALENDRONATE SODIUM 70 MG PO TABS: 70.0000 mg | ORAL_TABLET | ORAL | Status: DC

## 2012-04-02 ENCOUNTER — Ambulatory Visit (INDEPENDENT_AMBULATORY_CARE_PROVIDER_SITE_OTHER): Payer: Medicare Other | Admitting: Internal Medicine

## 2012-04-02 ENCOUNTER — Encounter: Payer: Self-pay | Admitting: Internal Medicine

## 2012-04-02 VITALS — BP 132/82 | HR 78 | Temp 98.2°F | Wt 106.2 lb

## 2012-04-02 DIAGNOSIS — E039 Hypothyroidism, unspecified: Secondary | ICD-10-CM

## 2012-04-02 DIAGNOSIS — I10 Essential (primary) hypertension: Secondary | ICD-10-CM

## 2012-04-02 DIAGNOSIS — F321 Major depressive disorder, single episode, moderate: Secondary | ICD-10-CM

## 2012-04-02 NOTE — Assessment & Plan Note (Signed)
Severe symptoms with anxiety overlap wax/wane flares -current med combination reviewed and updated continue Remeron in place of same advised to consider the lorazepam as "BP" control medication - Continue to work with psyc (plovsky) as ongoing, continue efforts at behav therapy The current medical regimen is effective;  continue present plan and medications.

## 2012-04-02 NOTE — Patient Instructions (Addendum)
It was good to see you today. We have reviewed your prior records including labs and tests today Medications reviewed and updated - no changes recommended Continue mirtazapine and lorazepam as reviewed and follow up with Dr Donell Beers and counseling for you and your family Please schedule followup in 6 months for blood pressure check, call sooner if problems.

## 2012-04-02 NOTE — Progress Notes (Signed)
  Subjective:    Patient ID: Marisa Smith, female    DOB: March 06, 1924, 79 y.o.   MRN: [id number]  HPI   Here for follow up - reviewed chronic medical issues and interval history  Past Medical History  Diagnosis Date  . Unspecified vitamin D deficiency   . Headache 02/15/2010  . ALLERGIC RHINITIS CAUSE UNSPECIFIED   . ANXIETY   . DEPRESSION, MAJOR, MODERATE   . HYPERTENSION, BENIGN ESSENTIAL   . HYPOTHYROIDISM   . OSTEOPOROSIS   . Autoimmune hepatitis     on MP6, prev pred   Review of Systems  Respiratory: Negative for cough, chest tightness and shortness of breath.   Cardiovascular: Negative for chest pain, palpitations and leg swelling.  Psychiatric/Behavioral: Positive for behavioral problems, dysphoric mood (chronic w/o change) and decreased concentration. Negative for confusion. The patient is not nervous/anxious.        Objective:   Physical Exam  BP 132/82  Pulse 78  Temp(Src) 98.2 F (36.8 C) (Oral)  Wt 106 lb 3.2 oz (48.172 kg)  BMI 17.15 kg/m2  SpO2 99% Wt Readings from Last 3 Encounters:  04/02/12 106 lb 3.2 oz (48.172 kg)  02/06/12 106 lb 6.4 oz (48.263 kg)  12/30/11 107 lb (48.535 kg)   Constitutional: She appears well-developed and well-nourished. No distress.  Neck: Normal range of motion. Neck supple. No JVD present. No thyromegaly present.  Cardiovascular: Normal rate, regular rhythm and normal heart sounds.  No murmur heard. No BLE edema. Pulmonary/Chest: Effort normal and breath sounds normal. No respiratory distress. She has no wheezes.  Psychiatric: She has a flat and dysphoric mood and affect. Her behavior is normal. Judgment and thought content normal.   Lab Results  Component Value Date   WBC 5.9 11/24/2011   HGB 13.2 11/24/2011   HCT 37.5 11/24/2011   PLT 173 11/24/2011   GLUCOSE 100* 12/18/2011   CHOL 168 11/24/2011   TRIG 33 11/24/2011   HDL 86 11/24/2011   LDLCALC 75 11/24/2011   ALT 15 11/23/2011   AST 26 11/23/2011   NA 133*  12/18/2011   K 4.4 12/18/2011   CL 96 12/18/2011   CREATININE 0.7 12/18/2011   BUN 14 12/18/2011   CO2 29 12/18/2011   TSH 3.387 11/24/2011   HGBA1C 6.1* 11/24/2011        Assessment & Plan:  See problem list. Medications and labs reviewed today.  Time spent with pt today 25 minutes, greater than 50% time spent counseling patient on hypertension, depression and medication review. Also review of prior records

## 2012-04-02 NOTE — Assessment & Plan Note (Signed)
BP Readings from Last 3 Encounters:  04/02/12 132/82  02/06/12 132/82  12/30/11 152/90   10/13 meds changed due bradycardia - stopped atenolol and lisinopril Started Benicar - BP good and no adverse symptoms  hx hyponatremia (exac by hctz in past) The current medical regimen is effective;  continue present plan and medications.

## 2012-04-02 NOTE — Assessment & Plan Note (Signed)
Lab Results  Component Value Date   TSH 3.387 11/24/2011   the current medical regimen is effective;  continue present plan and medications.

## 2012-04-21 ENCOUNTER — Other Ambulatory Visit: Payer: Self-pay | Admitting: *Deleted

## 2012-04-21 MED ORDER — LEVOTHYROXINE SODIUM 50 MCG PO TABS: 50.0000 ug | ORAL_TABLET | Freq: Every day | ORAL | Status: DC

## 2012-06-09 ENCOUNTER — Ambulatory Visit: Payer: Self-pay | Admitting: Nurse Practitioner

## 2012-06-29 ENCOUNTER — Telehealth: Payer: Self-pay

## 2012-06-29 NOTE — Telephone Encounter (Signed)
Pt has been notified to have labs she will be in this week

## 2012-06-29 NOTE — Telephone Encounter (Signed)
Message copied by Donata Duff on Tue Jun 29, 2012  8:11 AM ------      Message from: Donata Duff      Created: Tue Dec 30, 2011  2:29 PM       Pt to have labs  ------

## 2012-07-05 ENCOUNTER — Other Ambulatory Visit (INDEPENDENT_AMBULATORY_CARE_PROVIDER_SITE_OTHER): Payer: Medicare Other

## 2012-07-05 DIAGNOSIS — K754 Autoimmune hepatitis: Secondary | ICD-10-CM

## 2012-07-05 LAB — CBC WITH DIFFERENTIAL/PLATELET
Basophils Relative: 0.4 % (ref 0.0–3.0)
Eosinophils Relative: 3.3 % (ref 0.0–5.0)
MCV: 102.3 fl — ABNORMAL HIGH (ref 78.0–100.0)
Monocytes Absolute: 0.6 10*3/uL (ref 0.1–1.0)
Monocytes Relative: 12.8 % — ABNORMAL HIGH (ref 3.0–12.0)
Neutrophils Relative %: 63.1 % (ref 43.0–77.0)
RBC: 3.34 Mil/uL — ABNORMAL LOW (ref 3.87–5.11)
WBC: 4.9 10*3/uL (ref 4.5–10.5)

## 2012-07-05 LAB — COMPREHENSIVE METABOLIC PANEL
Albumin: 3.7 g/dL (ref 3.5–5.2)
Alkaline Phosphatase: 51 U/L (ref 39–117)
BUN: 17 mg/dL (ref 6–23)
CO2: 25 mEq/L (ref 19–32)
Calcium: 9.2 mg/dL (ref 8.4–10.5)
Chloride: 97 mEq/L (ref 96–112)
GFR: 77.88 mL/min (ref >60.00)
Glucose, Bld: 83 mg/dL (ref 70–99)
Potassium: 4.3 mEq/L (ref 3.5–5.1)
Sodium: 130 mEq/L — ABNORMAL LOW (ref 135–145)
Total Protein: 7.1 g/dL (ref 6.0–8.3)

## 2012-08-02 ENCOUNTER — Telehealth: Payer: Self-pay | Admitting: *Deleted

## 2012-08-02 NOTE — Telephone Encounter (Signed)
Pt left message states she has questions about her Benicar medication.  Please advise

## 2012-08-02 NOTE — Telephone Encounter (Signed)
Please inquire as to the nature of her question for me -

## 2012-08-03 NOTE — Telephone Encounter (Signed)
OV advised

## 2012-08-03 NOTE — Telephone Encounter (Signed)
Notified pt with md response. Made appt for tomorrow am @ 10:45/lmb

## 2012-08-03 NOTE — Telephone Encounter (Signed)
Pt states she has developed unusual hoarseness and swelling of ankles and more frequent urination at night.  please advise

## 2012-08-04 ENCOUNTER — Ambulatory Visit (INDEPENDENT_AMBULATORY_CARE_PROVIDER_SITE_OTHER): Payer: Medicare Other | Admitting: Internal Medicine

## 2012-08-04 ENCOUNTER — Encounter: Payer: Self-pay | Admitting: Internal Medicine

## 2012-08-04 VITALS — BP 140/72 | HR 62 | Temp 97.7°F | Wt 110.8 lb

## 2012-08-04 DIAGNOSIS — H6123 Impacted cerumen, bilateral: Secondary | ICD-10-CM

## 2012-08-04 DIAGNOSIS — R490 Dysphonia: Secondary | ICD-10-CM

## 2012-08-04 DIAGNOSIS — I10 Essential (primary) hypertension: Secondary | ICD-10-CM

## 2012-08-04 DIAGNOSIS — H6120 Impacted cerumen, unspecified ear: Secondary | ICD-10-CM

## 2012-08-04 DIAGNOSIS — M810 Age-related osteoporosis without current pathological fracture: Secondary | ICD-10-CM

## 2012-08-04 MED ORDER — OMEPRAZOLE 20 MG PO CPDR: 20.0000 mg | DELAYED_RELEASE_CAPSULE | Freq: Every day | ORAL | Status: DC

## 2012-08-04 NOTE — Assessment & Plan Note (Signed)
On chronic Fosamax therapy, will discontinue same at this time because of complaints of hoarseness We'll reevaluate need for alternate therapy as needed Also treat PPI x30 days for acid suppression to help symptomatic hoarseness

## 2012-08-04 NOTE — Patient Instructions (Signed)
It was good to see you today. Your ears have been irrigated of wax today -let us know if continued hearing problems persist for referral to audiologist and hearing testing Medications reviewed - top weekly Fosamax, but continue Benicar daily and other medications as prescribed Begin omeprazole daily for acid suppression over next 30 days Your prescription(s) have been submitted to your pharmacy. Please take as directed and contact our office if you believe you are having problem(s) with the medication(s). Followup as scheduled for routine review, or 3-4 months -call sooner if problems

## 2012-08-04 NOTE — Assessment & Plan Note (Signed)
BP Readings from Last 3 Encounters:  08/04/12 140/72  04/02/12 132/82  02/06/12 132/82   10/13 meds changed due bradycardia - stopped atenolol and lisinopril Started Benicar - BP good and no adverse symptoms -Reassured edema and hoarseness are unrelated to ARB hx hyponatremia (exac by hctz in past) The current medical regimen is effective;  continue present plan and medications.

## 2012-08-04 NOTE — Progress Notes (Signed)
  Subjective:    Patient ID: Marisa Smith, female    DOB: 04/12/1924, 79 y.o.   MRN: [id number]  HPI  Here for follow up - reviewed chronic medical issues and interval history See phone note in EMR from yesterday prompting today's visit  Past Medical History  Diagnosis Date  . Unspecified vitamin D deficiency   . Headache(784.0) 02/15/2010  . ALLERGIC RHINITIS CAUSE UNSPECIFIED   . ANXIETY   . DEPRESSION, MAJOR, MODERATE   . HYPERTENSION, BENIGN ESSENTIAL   . HYPOTHYROIDISM   . OSTEOPOROSIS   . Autoimmune hepatitis     on MP6, prev pred   Review of Systems  HENT: Positive for hearing loss. Negative for ear pain, tinnitus and ear discharge.        Hoarseness, chronic  Respiratory: Negative for cough, chest tightness and shortness of breath.   Cardiovascular: Positive for leg swelling (now resolved). Negative for chest pain and palpitations.  Psychiatric/Behavioral: Positive for dysphoric mood (chronic w/o change) and decreased concentration. The patient is not nervous/anxious.        Objective:   Physical Exam  BP 140/72  Pulse 62  Temp(Src) 97.7 F (36.5 C) (Oral)  Wt 110 lb 12.8 oz (50.259 kg)  BMI 17.89 kg/m2  SpO2 97% Wt Readings from Last 3 Encounters:  08/04/12 110 lb 12.8 oz (50.259 kg)  04/02/12 106 lb 3.2 oz (48.172 kg)  02/06/12 106 lb 6.4 oz (48.263 kg)   Constitutional: She appears well-developed and well-nourished. No distress. Son in law at side HENT: B TMs obscured with soft cerumen - after irrigation, clear TMs, no effusion or erythema Neck: Normal range of motion. Neck supple. No JVD present. No thyromegaly present.  Cardiovascular: Normal rate, regular rhythm and normal heart sounds.  No murmur heard. No BLE edema. Pulmonary/Chest: Effort normal and breath sounds normal. No respiratory distress. She has no wheezes.  Psychiatric: She has a flat and dysphoric mood and affect. Her behavior is normal. Judgment and thought content normal.   Lab Results   Component Value Date   WBC 4.9 07/05/2012   HGB 11.5* 07/05/2012   HCT 34.2* 07/05/2012   PLT 157.0 07/05/2012   GLUCOSE 83 07/05/2012   CHOL 168 11/24/2011   TRIG 33 11/24/2011   HDL 86 11/24/2011   LDLCALC 75 11/24/2011   ALT 19 07/05/2012   AST 28 07/05/2012   NA 130* 07/05/2012   K 4.3 07/05/2012   CL 97 07/05/2012   CREATININE 0.8 07/05/2012   BUN 17 07/05/2012   CO2 25 07/05/2012   TSH 3.387 11/24/2011   HGBA1C 6.1* 11/24/2011   Procedure: wax removal Reason: wax impaction Risks and benefits of procedure discussed with the patient who agrees to proceed. Ear(s) irrigated with warm water. Large amount of wax removed. Instrumentation with metal ear loop was performed to accomplish wax removal. the patient tolerated procedure well.      Assessment & Plan:  See problem list. Medications and labs reviewed today.  Cerumen impaction, bilateral - irrigation as above today with improvement in symptoms.

## 2012-08-05 ENCOUNTER — Telehealth: Payer: Self-pay | Admitting: *Deleted

## 2012-08-05 NOTE — Telephone Encounter (Signed)
Spoke with pt, advised her of MD message.

## 2012-08-05 NOTE — Telephone Encounter (Signed)
Pt called states she is also having weakness when she takes the Benicar.  Please advise.

## 2012-08-05 NOTE — Telephone Encounter (Signed)
We discussed this at her OV - i feel she should continue the Benicar as prescribed because the other meds are more likely to cause her weakness.  We stopped other meds at her last OV and she needs to give these changes time.

## 2012-08-13 ENCOUNTER — Telehealth: Payer: Self-pay | Admitting: *Deleted

## 2012-08-13 NOTE — Telephone Encounter (Signed)
noted 

## 2012-08-13 NOTE — Telephone Encounter (Signed)
Pt called states the Rx information sheet states the Benicar causes Hoarseness.  She just wanted you to know that the information sheet says it causes hoarseness.

## 2012-10-01 ENCOUNTER — Other Ambulatory Visit (INDEPENDENT_AMBULATORY_CARE_PROVIDER_SITE_OTHER): Payer: Medicare Other

## 2012-10-01 ENCOUNTER — Encounter: Payer: Self-pay | Admitting: Internal Medicine

## 2012-10-01 ENCOUNTER — Ambulatory Visit (INDEPENDENT_AMBULATORY_CARE_PROVIDER_SITE_OTHER): Payer: Medicare Other | Admitting: Internal Medicine

## 2012-10-01 VITALS — BP 160/80 | HR 73 | Temp 98.0°F | Wt 108.0 lb

## 2012-10-01 DIAGNOSIS — E039 Hypothyroidism, unspecified: Secondary | ICD-10-CM

## 2012-10-01 DIAGNOSIS — F321 Major depressive disorder, single episode, moderate: Secondary | ICD-10-CM

## 2012-10-01 DIAGNOSIS — I10 Essential (primary) hypertension: Secondary | ICD-10-CM

## 2012-10-01 DIAGNOSIS — E871 Hypo-osmolality and hyponatremia: Secondary | ICD-10-CM

## 2012-10-01 LAB — BASIC METABOLIC PANEL
BUN: 18 mg/dL (ref 6–23)
Calcium: 9.6 mg/dL (ref 8.4–10.5)
Chloride: 97 mEq/L (ref 96–112)
Creatinine, Ser: 0.8 mg/dL (ref 0.4–1.2)

## 2012-10-01 LAB — TSH: TSH: 3.32 u[IU]/mL (ref 0.35–5.50)

## 2012-10-01 NOTE — Assessment & Plan Note (Signed)
Noted during October 2012 hospitalization for transient expressive aphasia, felt related to diuretic therapy Recheck now  remains off HCTZ - Suspect low Na related to psyc meds (remeron, prev abilify?) No change proposed as long as mild and asymptomatic

## 2012-10-01 NOTE — Progress Notes (Signed)
  Subjective:    Patient ID: Marisa Smith, female    DOB: Sep 11, 1924, 79 y.o.   MRN: [id number]  HPI Here for follow up - reviewed chronic medical issues and interval history  Reports increasing dizziness and depression symptoms - ?spikes in BP, "but i don't check my BP"  Past Medical History  Diagnosis Date  . Unspecified vitamin D deficiency   . Headache(784.0) 02/15/2010  . ALLERGIC RHINITIS CAUSE UNSPECIFIED   . ANXIETY   . DEPRESSION, MAJOR, MODERATE   . HYPERTENSION, BENIGN ESSENTIAL   . HYPOTHYROIDISM   . OSTEOPOROSIS   . Autoimmune hepatitis     on MP6, prev pred   Review of Systems  Constitutional: Positive for fatigue. Negative for fever and unexpected weight change.  HENT: Negative for hearing loss, ear pain, congestion, rhinorrhea, neck pain, postnasal drip, sinus pressure, tinnitus and ear discharge.        Hoarseness, chronic  Respiratory: Negative for cough, chest tightness and shortness of breath.   Cardiovascular: Negative for chest pain, palpitations and leg swelling.  Neurological: Positive for dizziness, weakness (generalized) and light-headedness. Negative for facial asymmetry, numbness and headaches.  Psychiatric/Behavioral: Positive for dysphoric mood (chronic w/o change) and decreased concentration. Negative for behavioral problems, confusion, sleep disturbance and self-injury. The patient is not nervous/anxious.        Objective:   Physical Exam BP 160/80  Pulse 73  Temp(Src) 98 F (36.7 C) (Oral)  Wt 108 lb (48.988 kg)  BMI 17.44 kg/m2  SpO2 98% Wt Readings from Last 3 Encounters:  10/01/12 108 lb (48.988 kg)  08/04/12 110 lb 12.8 oz (50.259 kg)  04/02/12 106 lb 3.2 oz (48.172 kg)   Constitutional: She appears well-developed and well-nourished. No distress.  Neck: Normal range of motion. Neck supple. No JVD present. No thyromegaly present.  Cardiovascular: Normal rate, regular rhythm and normal heart sounds.  No murmur heard. No BLE  edema. Pulmonary/Chest: Effort normal and breath sounds normal. No respiratory distress. She has no wheezes.  Neurologic - AAOx4, CN2-12 sym intact - MAE well, Coordination, speech and gait (with cane asst) are normal Psychiatric: She has a flat and dysphoric mood and affect. Her behavior is normal. Judgment and thought content normal.   Lab Results  Component Value Date   WBC 4.9 07/05/2012   HGB 11.5* 07/05/2012   HCT 34.2* 07/05/2012   PLT 157.0 07/05/2012   GLUCOSE 83 07/05/2012   CHOL 168 11/24/2011   TRIG 33 11/24/2011   HDL 86 11/24/2011   LDLCALC 75 11/24/2011   ALT 19 07/05/2012   AST 28 07/05/2012   NA 130* 07/05/2012   K 4.3 07/05/2012   CL 97 07/05/2012   CREATININE 0.8 07/05/2012   BUN 17 07/05/2012   CO2 25 07/05/2012   TSH 3.387 11/24/2011   HGBA1C 6.1* 11/24/2011        Assessment & Plan:  See problem list. Medications and labs reviewed today.

## 2012-10-01 NOTE — Assessment & Plan Note (Signed)
BP Readings from Last 3 Encounters:  10/01/12 160/80  08/04/12 140/72  04/02/12 132/82   10/13 meds changed due bradycardia - stopped atenolol and lisinopril Started Benicar late 2013, at max dose - no adverse symptoms - hx hyponatremia (exac by hctz in past) -check Bmet now

## 2012-10-01 NOTE — Patient Instructions (Signed)
It was good to see you today. We have reviewed your prior records including labs and tests today Test(s) ordered today. Your results will be released to MyChart (or called to you) after review, usually within 72hours after test completion. If any changes need to be made, you will be notified at that same time. Medications reviewed and updated, no changes recommended at this time. Followup as scheduled for routine review, or 3-4 months -call sooner if problems

## 2012-10-01 NOTE — Assessment & Plan Note (Signed)
Lab Results  Component Value Date   TSH 3.387 11/24/2011   the current medical regimen is effective;  continue present plan and medications.

## 2012-10-01 NOTE — Assessment & Plan Note (Signed)
Severe symptoms with anxiety overlap wax/wane flares -current med combination reviewed and updated continue Remeron and Nardil advised to consider the lorazepam as "BP" control medication - Continue to work with psyc (plovsky) as ongoing, continue efforts at [facility] therapy

## 2012-10-05 ENCOUNTER — Telehealth: Payer: Self-pay | Admitting: *Deleted

## 2012-10-05 NOTE — Telephone Encounter (Signed)
Notified pt with md response.../lmb 

## 2012-10-05 NOTE — Telephone Encounter (Signed)
Nardil is more likely to cause low blood pressure readings rather than high. I encourage patient to talk to her psychiatrist regarding concerns over any medications he prescribes thanks

## 2012-10-05 NOTE — Telephone Encounter (Signed)
Called pt concerning labs results. Pt wanted to ask md does she think the Nardil that Dr. Edd Fabian rx is making her BP increase. She states she read label & it states if have hypertension do not take because it increases BP...Raechel Chute

## 2012-11-08 ENCOUNTER — Other Ambulatory Visit: Payer: Self-pay | Admitting: Internal Medicine

## 2012-12-06 ENCOUNTER — Telehealth: Payer: Self-pay

## 2012-12-06 DIAGNOSIS — K754 Autoimmune hepatitis: Secondary | ICD-10-CM

## 2012-12-06 NOTE — Telephone Encounter (Signed)
Message copied by Donata Duff on Mon Dec 06, 2012  8:24 AM ------      Message from: Donata Duff      Created: Tue Jul 06, 2012  9:44 AM       She needs rov with me in November 2014, cbc, cmet the day prior                  ------

## 2012-12-06 NOTE — Telephone Encounter (Signed)
Left message on machine to call back  

## 2012-12-06 NOTE — Telephone Encounter (Signed)
Pt has been scheduled and is aware of the appt and will have labs prior

## 2012-12-23 ENCOUNTER — Other Ambulatory Visit: Payer: Self-pay | Admitting: Internal Medicine

## 2012-12-31 ENCOUNTER — Other Ambulatory Visit: Payer: Self-pay | Admitting: Internal Medicine

## 2013-01-03 ENCOUNTER — Other Ambulatory Visit: Payer: Self-pay | Admitting: Gastroenterology

## 2013-01-04 ENCOUNTER — Encounter: Payer: Self-pay | Admitting: Internal Medicine

## 2013-01-04 ENCOUNTER — Telehealth: Payer: Self-pay | Admitting: Internal Medicine

## 2013-01-04 ENCOUNTER — Ambulatory Visit (INDEPENDENT_AMBULATORY_CARE_PROVIDER_SITE_OTHER): Payer: Medicare Other | Admitting: Internal Medicine

## 2013-01-04 ENCOUNTER — Other Ambulatory Visit (INDEPENDENT_AMBULATORY_CARE_PROVIDER_SITE_OTHER): Payer: Medicare Other

## 2013-01-04 VITALS — BP 142/80 | HR 70 | Temp 98.3°F | Wt 104.8 lb

## 2013-01-04 DIAGNOSIS — R7309 Other abnormal glucose: Secondary | ICD-10-CM

## 2013-01-04 DIAGNOSIS — R739 Hyperglycemia, unspecified: Secondary | ICD-10-CM

## 2013-01-04 DIAGNOSIS — N952 Postmenopausal atrophic vaginitis: Secondary | ICD-10-CM

## 2013-01-04 DIAGNOSIS — H6123 Impacted cerumen, bilateral: Secondary | ICD-10-CM

## 2013-01-04 DIAGNOSIS — H6120 Impacted cerumen, unspecified ear: Secondary | ICD-10-CM

## 2013-01-04 DIAGNOSIS — I10 Essential (primary) hypertension: Secondary | ICD-10-CM

## 2013-01-04 DIAGNOSIS — Z23 Encounter for immunization: Secondary | ICD-10-CM

## 2013-01-04 DIAGNOSIS — F411 Generalized anxiety disorder: Secondary | ICD-10-CM

## 2013-01-04 LAB — BASIC METABOLIC PANEL
BUN: 17 mg/dL (ref 6–23)
Chloride: 95 mEq/L — ABNORMAL LOW (ref 96–112)
Creatinine, Ser: 0.8 mg/dL (ref 0.4–1.2)
GFR: 75.46 mL/min (ref >60.00)
Glucose, Bld: 92 mg/dL (ref 70–99)
Potassium: 4.3 mEq/L (ref 3.5–5.1)

## 2013-01-04 MED ORDER — ESTROGENS, CONJUGATED 0.625 MG/GM VA CREA: 1.0000 | TOPICAL_CREAM | Freq: Every day | VAGINAL | Status: DC

## 2013-01-04 MED ORDER — ESTRADIOL 10 MCG VA TABS: 1.0000 | ORAL_TABLET | VAGINAL | Status: DC

## 2013-01-04 NOTE — Progress Notes (Signed)
Subjective:    Patient ID: Marisa Smith, female    DOB: [birth year], 79 y.o.   MRN: [id number]  HPI Here for follow up - reviewed chronic medical issues and interval history  Also request ears be checked and irrigated of wax as needed  Past Medical History  Diagnosis Date  . Unspecified vitamin D deficiency   . Headache(784.0) 02/15/2010  . ALLERGIC RHINITIS CAUSE UNSPECIFIED   . ANXIETY   . DEPRESSION, MAJOR, MODERATE   . HYPERTENSION, BENIGN ESSENTIAL   . HYPOTHYROIDISM   . OSTEOPOROSIS   . Autoimmune hepatitis     on MP6, prev pred   Review of Systems  Constitutional: Positive for fatigue. Negative for fever and unexpected weight change.  HENT: Negative for congestion, ear discharge, ear pain, hearing loss, postnasal drip, rhinorrhea, sinus pressure and tinnitus.        Hoarseness, chronic  Respiratory: Negative for cough, chest tightness and shortness of breath.   Cardiovascular: Negative for chest pain, palpitations and leg swelling.  Musculoskeletal: Negative for neck pain.  Neurological: Positive for dizziness, weakness (generalized) and light-headedness. Negative for facial asymmetry, numbness and headaches.  Psychiatric/Behavioral: Positive for dysphoric mood (chronic w/o change) and decreased concentration. Negative for behavioral problems, confusion, sleep disturbance and self-injury. The patient is not nervous/anxious.        Objective:   Physical Exam BP 142/80  Pulse 70  Temp(Src) 98.3 F (36.8 C) (Oral)  Wt 104 lb 12.8 oz (47.537 kg)  SpO2 97% Wt Readings from Last 3 Encounters:  01/04/13 104 lb 12.8 oz (47.537 kg)  10/01/12 108 lb (48.988 kg)  08/04/12 110 lb 12.8 oz (50.259 kg)   Constitutional: She appears well-developed and well-nourished. No distress.  HEENT. Bilateral cerumen impaction. Irrigation and curettage performed. Tympanic membranes clear without erythema or effusion upper procedure Neck: Normal range of motion. Neck supple. No JVD present.  No thyromegaly present.  Cardiovascular: Normal rate, regular rhythm and normal heart sounds.  No murmur heard. No BLE edema. Pulmonary/Chest: Effort normal and breath sounds normal. No respiratory distress. She has no wheezes.  Neurologic - AAOx4, CN2-12 sym intact - MAE well, Coordination, speech and gait (with cane asst) are normal Psychiatric: She has a flat and dysphoric mood and affect. Her behavior is normal. Judgment and thought content normal.   Lab Results  Component Value Date   WBC 4.9 07/05/2012   HGB 11.5* 07/05/2012   HCT 34.2* 07/05/2012   PLT 157.0 07/05/2012   GLUCOSE 93 10/01/2012   CHOL 168 11/24/2011   TRIG 33 11/24/2011   HDL 86 11/24/2011   LDLCALC 75 11/24/2011   ALT 19 07/05/2012   AST 28 07/05/2012   NA 131* 10/01/2012   K 4.2 10/01/2012   CL 97 10/01/2012   CREATININE 0.8 10/01/2012   BUN 18 10/01/2012   CO2 28 10/01/2012   TSH 3.32 10/01/2012   HGBA1C 6.1* 11/24/2011   Procedure: wax removal Reason: wax impaction Risks and benefits of procedure discussed with the patient who agrees to proceed. Ear(s) irrigated with warm water. Large amount of wax removed. Instrumentation with metal ear loop was performed to accomplish wax removal. the patient tolerated procedure well.      Assessment & Plan:  See problem list. Medications and labs reviewed today.  Cerumen impaction. Wax removal as noted above today  PMP atrophic vaginitis - reports increase dryness and requests refill on premarin cream - erx done - pt agrees to follow up gyn if symptoms worse or unimproved

## 2013-01-04 NOTE — Progress Notes (Signed)
Pre-visit discussion using our clinic review tool. No additional management support is needed unless otherwise documented below in the visit note.  

## 2013-01-04 NOTE — Assessment & Plan Note (Signed)
Incidental hyperglycemia 11/2011 hospitalization but normal a1c Reassurance provided but will continue to monitor  Lab Results  Component Value Date   HGBA1C 6.1* 11/24/2011

## 2013-01-04 NOTE — Telephone Encounter (Signed)
01/04/2013  Pt came back in after appt today and wanted Dr. Felicity Coyer or Valentina Gu to call because she has some questions she forgot to ask during the appt.  Please contact pt.

## 2013-01-04 NOTE — Assessment & Plan Note (Signed)
BP Readings from Last 3 Encounters:  01/04/13 142/80  10/01/12 160/80  08/04/12 140/72   10/13 meds changed due bradycardia - stopped atenolol and lisinopril Started Benicar late 2013, at max dose - no adverse symptoms - hx hyponatremia (exac by hctz in past) -check Bmet now

## 2013-01-04 NOTE — Telephone Encounter (Signed)
Notified pt with medication change. Pt is also wanting to ask md she states she forgot to mention this at the visit. She is waking up 2-3 times a night urinating. Ask pt was she having any burning, or back pain. Pt states she isn't having any symptoms just urinating a lot at night when she is not suppose too. Wanting recommendation on what to do...lmb

## 2013-01-04 NOTE — Assessment & Plan Note (Signed)
Severe symptoms with depression overlap Follows with psyc (plovsky) for same, continue efforts at behav therapy wax/wane flares -current med combination reviewed and updated advised to consider the lorazepam as "BP" control medication -

## 2013-01-04 NOTE — Patient Instructions (Addendum)
It was good to see you today.  We have reviewed your prior records including labs and tests today  Your annual flu shot was given and/or updated today.  Test(s) ordered today. Your results will be released to MyChart (or called to you) after review, usually within 72hours after test completion. If any changes need to be made, you will be notified at that same time.  Medications reviewed and updated, ok for Premarin cream as requested -no other changes recommended at this time.  Your prescription(s) have been submitted to your pharmacy. Please take as directed and contact our office if you believe you are having problem(s) with the medication(s).  Your ears have been irrigated of wax today -let us know if continued hearing problems persist for referral to audiologist and hearing testing  Please schedule followup in 6 months, call sooner if problems.  Atrophic Vaginitis Atrophic vaginitis is a problem of low levels of estrogen in women. This problem can happen at any age. It is most common in women who have gone through menopause ("the change").  HOW WILL I KNOW IF I HAVE THIS PROBLEM? You may have:  Trouble with peeing (urinating), such as:  Going to the bathroom often.  A hard time holding your pee until you reach a bathroom.  Leaking pee.  Having pain when you pee.  Itching or a burning feeling.  Vaginal bleeding and spotting.  Pain during sex.  Dryness of the vagina.  A yellow, bad-smelling fluid (discharge) coming from the vagina. HOW WILL MY DOCTOR CHECK FOR THIS PROBLEM?  During your exam, your doctor will likely find the problem.  If there is a vaginal fluid, it may be checked for infection. HOW WILL THIS PROBLEM BE TREATED? Keep the vulvar skin as clean as possible. Moisturizers and lubricants can help with some of the symptoms. Estrogen replacement can help. There are 2 ways to take estrogen:  Systemic estrogen gets estrogen to your whole body. It takes many  weeks or months before the symptoms get better.  You take an estrogen pill.  You use a skin patch. This is a patch that you put on your skin.  If you still have your uterus, your doctor may ask you to take a hormone. Talk to your doctor about the right medicine for you.  Estrogen cream.  This puts estrogen only at the part of your body where you apply it. The cream is put into the vagina or put on the vulvar skin. For some women, estrogen cream works faster than pills or the patch. CAN ALL WOMEN WITH THIS PROBLEM USE ESTROGEN? No. Women with certain types of cancer, liver problems, or problems with blood clots should not take estrogen. Your doctor can help you decide the best treatment for your symptoms. Document Released: 07/09/2007 Document Revised: 04/14/2011 Document Reviewed: 07/09/2007 [facility] Patient Information 2014 [place], [place].

## 2013-01-04 NOTE — Telephone Encounter (Signed)
Changed cream to vaginal tablets - use 2x/week

## 2013-01-04 NOTE — Telephone Encounter (Signed)
Received fax stating pt is requesting md to change premarin vaginal cream to something else. 30 gram tube will cost her $153.47 med too expensive...Raechel Chute

## 2013-01-05 ENCOUNTER — Telehealth: Payer: Self-pay

## 2013-01-05 NOTE — Telephone Encounter (Signed)
Pt has been notified to have labs  

## 2013-01-05 NOTE — Telephone Encounter (Signed)
Called the patient informed of MD instructions. 

## 2013-01-05 NOTE — Telephone Encounter (Signed)
Message copied by Donata Duff on Wed Jan 05, 2013  9:57 AM ------      Message from: Donata Duff      Created: Mon Dec 06, 2012 10:42 AM       Pt to get labs  ------

## 2013-01-05 NOTE — Telephone Encounter (Signed)
Limit fluid intake - no drinking after 7pm

## 2013-01-10 ENCOUNTER — Ambulatory Visit: Payer: Medicare Other | Admitting: Gastroenterology

## 2013-01-12 ENCOUNTER — Ambulatory Visit: Payer: Medicare Other | Admitting: Gastroenterology

## 2013-01-14 ENCOUNTER — Other Ambulatory Visit (INDEPENDENT_AMBULATORY_CARE_PROVIDER_SITE_OTHER): Payer: Medicare Other

## 2013-01-14 DIAGNOSIS — K754 Autoimmune hepatitis: Secondary | ICD-10-CM

## 2013-01-14 LAB — CBC WITH DIFFERENTIAL/PLATELET
Basophils Absolute: 0 10*3/uL (ref 0.0–0.1)
HCT: 32.8 % — ABNORMAL LOW (ref 36.0–46.0)
Lymphs Abs: 0.9 10*3/uL (ref 0.7–4.0)
MCHC: 33.7 g/dL (ref 30.0–36.0)
MCV: 101.1 fl — ABNORMAL HIGH (ref 78.0–100.0)
Monocytes Absolute: 0.5 10*3/uL (ref 0.1–1.0)
Neutrophils Relative %: 66.8 % (ref 43.0–77.0)
Platelets: 182 10*3/uL (ref 150.0–400.0)
RDW: 13.2 % (ref 11.5–14.6)

## 2013-01-14 LAB — COMPREHENSIVE METABOLIC PANEL
ALT: 18 U/L (ref 0–35)
AST: 26 U/L (ref 0–37)
Albumin: 4 g/dL (ref 3.5–5.2)
Alkaline Phosphatase: 56 U/L (ref 39–117)
Calcium: 8.9 mg/dL (ref 8.4–10.5)
Sodium: 126 mEq/L — ABNORMAL LOW (ref 135–145)
Total Bilirubin: 0.6 mg/dL (ref 0.3–1.2)
Total Protein: 7.3 g/dL (ref 6.0–8.3)

## 2013-01-17 ENCOUNTER — Telehealth: Payer: Self-pay | Admitting: *Deleted

## 2013-01-17 NOTE — Telephone Encounter (Signed)
Stop vagifem

## 2013-01-17 NOTE — Telephone Encounter (Signed)
Pt called states since she started using the Vagifem she has began having vaginal discharge and headaches.  She does not wasn't to continue to use the medication.  Please advise

## 2013-01-18 ENCOUNTER — Encounter: Payer: Self-pay | Admitting: Gastroenterology

## 2013-01-18 ENCOUNTER — Ambulatory Visit (INDEPENDENT_AMBULATORY_CARE_PROVIDER_SITE_OTHER): Payer: Medicare Other | Admitting: Gastroenterology

## 2013-01-18 VITALS — BP 110/60 | HR 70 | Ht 66.0 in | Wt 104.0 lb

## 2013-01-18 DIAGNOSIS — Z23 Encounter for immunization: Secondary | ICD-10-CM

## 2013-01-18 DIAGNOSIS — K754 Autoimmune hepatitis: Secondary | ICD-10-CM

## 2013-01-18 NOTE — Telephone Encounter (Signed)
Spoke with pt advised of MDs message 

## 2013-01-18 NOTE — Patient Instructions (Signed)
Repeat labs (cbc, cmet) in 6 months. Please return to see Dr. Christella Hartigan in in 12 months.

## 2013-01-18 NOTE — Progress Notes (Signed)
Review of pertinent gastrointestinal problems:  1. Autoimmune hepatitis: diagnosed with AIH 2007 in [facility], she presented with jaundice, nausea. Eventually had liver biopsy. She was on prednisone for about 2 years. She was tried on another med that made her nausea then 6mp. She has been on 6mp for 3-4 years solo therapy. 12/2011 office visit, labs normal, doing well clinically, continue purenithol at her usual dose.   HPI: This is a  very pleasant 79 year old woman whom I last saw about 1 year ago.  Not doing too well this past year.  Has been a bit depressed.    Labs last week looked great.  She really has no complaints. Her husband suffers from parkinsonism and is currently in a rehabilitation facility.    Past Medical History  Diagnosis Date  . Unspecified vitamin D deficiency   . Headache(784.0) 02/15/2010  . ALLERGIC RHINITIS CAUSE UNSPECIFIED   . ANXIETY   . DEPRESSION, MAJOR, MODERATE   . HYPERTENSION, BENIGN ESSENTIAL   . HYPOTHYROIDISM   . OSTEOPOROSIS   . Autoimmune hepatitis     on MP6, prev pred    Past Surgical History  Procedure Laterality Date  . Cataract extraction  2008    Left eye  . Cataract extraction  2009    Right eye  . Tonsillectomy  1945    Current Outpatient Prescriptions  Medication Sig Dispense Refill  . aspirin EC 81 MG EC tablet Take 81 mg by mouth daily.       [name] Kitchen BENICAR 40 MG tablet TAKE 1 TABLET BY MOUTH DAILY  30 tablet  5  . calcium gluconate 500 MG tablet Take 500 mg by mouth daily.        . Cholecalciferol (VITAMIN D3) 2000 UNITS TABS Take 2,000 Units by mouth daily.        [name] Kitchen levothyroxine (SYNTHROID, LEVOTHROID) 50 MCG tablet TAKE 1 TABLET BY MOUTH ONCE DAILY  30 tablet  5  . LORazepam (ATIVAN) 0.5 MG tablet Take 0.5 mg by mouth 2 (two) times daily.       . mercaptopurine (PURINETHOL) 50 MG tablet TAKE 1/2 TABLET BY MOUTH DAILY  30 tablet  0  . mirtazapine (REMERON) 45 MG tablet Take 1 tablet (45 mg total) by mouth at bedtime.   30 tablet  3  . phenelzine (NARDIL) 15 MG tablet Take 7.5 mg by mouth 3 (three) times daily.       No current facility-administered medications for this visit.    Allergies as of 01/18/2013 - Review Complete 01/18/2013  Allergen Reaction Noted  . Amlodipine besylate    . Azathioprine    . Penicillins    . Sertraline hcl  10/25/2009  . Sulfonamide derivatives      Family History  Problem Relation Age of Onset  . Hypertension Mother   . Stroke Mother   . Heart disease Father   . Stroke Sister     History   Social History  . Marital Status: Married    Spouse Name: N/A    Number of Children: 1  . Years of Education: N/A   Occupational History  .     Social History Main Topics  . Smoking status: Never Smoker   . Smokeless tobacco: Never Used     Comment: Married, Retired- daughter is Okey Regal Macdonnell-Fifield responsible for majority of supervison for parents  . Alcohol Use: No  . Drug Use: No  . Sexual Activity: Not on file   Other Topics Concern  .  Not on file   Social History Narrative  . No narrative on file      Physical Exam: BP 110/60  Pulse 70  Ht 5\' 6"  (1.676 m)  Wt 104 lb (47.174 kg)  BMI 16.79 kg/m2 Constitutional: Elderly, frail Psychiatric: alert and oriented x3 Abdomen: soft, nontender, nondistended, no obvious ascites, no peritoneal signs, normal bowel sounds     Assessment and plan: 79 y.o. female with autoimmune hepatitis  She will continue on her low-dose mercaptopurine  at its current dose of 25 mg per day. She will have repeat labs checked in 6 months and will return to see me in 12 months.

## 2013-02-03 HISTORY — PX: HUMERUS IM NAIL: SHX1769

## 2013-02-16 ENCOUNTER — Encounter (HOSPITAL_COMMUNITY): Payer: Medicare Other | Admitting: Anesthesiology

## 2013-02-16 ENCOUNTER — Encounter (HOSPITAL_COMMUNITY)
Admission: EM | Disposition: A | Discharge status: Skilled Nursing Facility | Payer: Self-pay | Source: Home / Self Care | Attending: Internal Medicine

## 2013-02-16 ENCOUNTER — Inpatient Hospital Stay (HOSPITAL_COMMUNITY): Payer: Medicare Other

## 2013-02-16 ENCOUNTER — Inpatient Hospital Stay (HOSPITAL_COMMUNITY)
Admission: EM | Admit: 2013-02-16 | Discharge: 2013-02-18 | DRG: 492 | Disposition: A | Discharge status: Skilled Nursing Facility | Payer: Medicare Other | Attending: Internal Medicine | Admitting: Internal Medicine

## 2013-02-16 ENCOUNTER — Inpatient Hospital Stay (HOSPITAL_COMMUNITY): Payer: Medicare Other | Admitting: Anesthesiology

## 2013-02-16 ENCOUNTER — Emergency Department (HOSPITAL_COMMUNITY): Payer: Medicare Other

## 2013-02-16 ENCOUNTER — Encounter (HOSPITAL_COMMUNITY): Payer: Self-pay | Admitting: Emergency Medicine

## 2013-02-16 DIAGNOSIS — K754 Autoimmune hepatitis: Secondary | ICD-10-CM

## 2013-02-16 DIAGNOSIS — R001 Bradycardia, unspecified: Secondary | ICD-10-CM

## 2013-02-16 DIAGNOSIS — D62 Acute posthemorrhagic anemia: Secondary | ICD-10-CM | POA: Diagnosis not present

## 2013-02-16 DIAGNOSIS — M810 Age-related osteoporosis without current pathological fracture: Secondary | ICD-10-CM

## 2013-02-16 DIAGNOSIS — S52599A Other fractures of lower end of unspecified radius, initial encounter for closed fracture: Principal | ICD-10-CM | POA: Diagnosis present

## 2013-02-16 DIAGNOSIS — S42309A Unspecified fracture of shaft of humerus, unspecified arm, initial encounter for closed fracture: Secondary | ICD-10-CM

## 2013-02-16 DIAGNOSIS — F411 Generalized anxiety disorder: Secondary | ICD-10-CM

## 2013-02-16 DIAGNOSIS — R410 Disorientation, unspecified: Secondary | ICD-10-CM

## 2013-02-16 DIAGNOSIS — F321 Major depressive disorder, single episode, moderate: Secondary | ICD-10-CM | POA: Diagnosis present

## 2013-02-16 DIAGNOSIS — W19XXXA Unspecified fall, initial encounter: Secondary | ICD-10-CM

## 2013-02-16 DIAGNOSIS — Z79899 Other long term (current) drug therapy: Secondary | ICD-10-CM

## 2013-02-16 DIAGNOSIS — S42209A Unspecified fracture of upper end of unspecified humerus, initial encounter for closed fracture: Secondary | ICD-10-CM | POA: Diagnosis present

## 2013-02-16 DIAGNOSIS — S5290XA Unspecified fracture of unspecified forearm, initial encounter for closed fracture: Secondary | ICD-10-CM

## 2013-02-16 DIAGNOSIS — F0390 Unspecified dementia without behavioral disturbance: Secondary | ICD-10-CM | POA: Diagnosis present

## 2013-02-16 DIAGNOSIS — R739 Hyperglycemia, unspecified: Secondary | ICD-10-CM

## 2013-02-16 DIAGNOSIS — S52509A Unspecified fracture of the lower end of unspecified radius, initial encounter for closed fracture: Secondary | ICD-10-CM

## 2013-02-16 DIAGNOSIS — Z681 Body mass index (BMI) 19 or less, adult: Secondary | ICD-10-CM

## 2013-02-16 DIAGNOSIS — E871 Hypo-osmolality and hyponatremia: Secondary | ICD-10-CM

## 2013-02-16 DIAGNOSIS — R259 Unspecified abnormal involuntary movements: Secondary | ICD-10-CM

## 2013-02-16 DIAGNOSIS — Z7982 Long term (current) use of aspirin: Secondary | ICD-10-CM

## 2013-02-16 DIAGNOSIS — E559 Vitamin D deficiency, unspecified: Secondary | ICD-10-CM

## 2013-02-16 DIAGNOSIS — Y92009 Unspecified place in unspecified non-institutional (private) residence as the place of occurrence of the external cause: Secondary | ICD-10-CM

## 2013-02-16 DIAGNOSIS — E43 Unspecified severe protein-calorie malnutrition: Secondary | ICD-10-CM | POA: Diagnosis present

## 2013-02-16 DIAGNOSIS — R51 Headache: Secondary | ICD-10-CM

## 2013-02-16 DIAGNOSIS — E039 Hypothyroidism, unspecified: Secondary | ICD-10-CM

## 2013-02-16 DIAGNOSIS — I10 Essential (primary) hypertension: Secondary | ICD-10-CM

## 2013-02-16 DIAGNOSIS — W010XXA Fall on same level from slipping, tripping and stumbling without subsequent striking against object, initial encounter: Secondary | ICD-10-CM | POA: Diagnosis present

## 2013-02-16 HISTORY — PX: OPEN REDUCTION INTERNAL FIXATION (ORIF) DISTAL RADIAL FRACTURE: SHX5989

## 2013-02-16 LAB — CBC WITH DIFFERENTIAL/PLATELET
Basophils Absolute: 0 10*3/uL (ref 0.0–0.1)
Basophils Relative: 0 % (ref 0–1)
Eosinophils Absolute: 0 10*3/uL (ref 0.0–0.7)
Eosinophils Relative: 0 % (ref 0–5)
HCT: 32.7 % — ABNORMAL LOW (ref 36.0–46.0)
Hemoglobin: 11.7 g/dL — ABNORMAL LOW (ref 12.0–15.0)
LYMPHS ABS: 0.6 10*3/uL — AB (ref 0.7–4.0)
Lymphocytes Relative: 10 % — ABNORMAL LOW (ref 12–46)
MCH: 34.3 pg — AB (ref 26.0–34.0)
MCHC: 35.8 g/dL (ref 30.0–36.0)
MCV: 95.9 fL (ref 78.0–100.0)
MONO ABS: 0.7 10*3/uL (ref 0.1–1.0)
MONOS PCT: 11 % (ref 3–12)
NEUTROS PCT: 79 % — AB (ref 43–77)
Neutro Abs: 5 10*3/uL (ref 1.7–7.7)
PLATELETS: 152 10*3/uL (ref 150–400)
RBC: 3.41 MIL/uL — AB (ref 3.87–5.11)
RDW: 13.1 % (ref 11.5–15.5)
WBC: 6.3 10*3/uL (ref 4.0–10.5)

## 2013-02-16 LAB — BASIC METABOLIC PANEL
BUN: 20 mg/dL (ref 6–23)
CO2: 21 meq/L (ref 19–32)
CREATININE: 0.74 mg/dL (ref 0.50–1.10)
Calcium: 9 mg/dL (ref 8.4–10.5)
Chloride: 93 mEq/L — ABNORMAL LOW (ref 96–112)
GFR calc Af Amer: 87 mL/min — ABNORMAL LOW (ref >90)
GFR calc non Af Amer: 75 mL/min — ABNORMAL LOW (ref >90)
GLUCOSE: 154 mg/dL — AB (ref 70–99)
Potassium: 3.9 mEq/L (ref 3.7–5.3)
Sodium: 132 mEq/L — ABNORMAL LOW (ref 137–147)

## 2013-02-16 LAB — CBC
HCT: 30.5 % — ABNORMAL LOW (ref 36.0–46.0)
Hemoglobin: 10.9 g/dL — ABNORMAL LOW (ref 12.0–15.0)
MCH: 34.5 pg — ABNORMAL HIGH (ref 26.0–34.0)
MCHC: 35.7 g/dL (ref 30.0–36.0)
MCV: 96.5 fL (ref 78.0–100.0)
PLATELETS: 144 10*3/uL — AB (ref 150–400)
RBC: 3.16 MIL/uL — ABNORMAL LOW (ref 3.87–5.11)
RDW: 13 % (ref 11.5–15.5)
WBC: 5.6 10*3/uL (ref 4.0–10.5)

## 2013-02-16 LAB — URINALYSIS, ROUTINE W REFLEX MICROSCOPIC
BILIRUBIN URINE: NEGATIVE
Glucose, UA: 100 mg/dL — AB
HGB URINE DIPSTICK: NEGATIVE
KETONES UR: 40 mg/dL — AB
LEUKOCYTES UA: NEGATIVE
Nitrite: NEGATIVE
PH: 7.5 (ref 5.0–8.0)
Protein, ur: NEGATIVE mg/dL
Specific Gravity, Urine: 1.014 (ref 1.005–1.030)
Urobilinogen, UA: 1 mg/dL (ref 0.0–1.0)

## 2013-02-16 LAB — CREATININE, SERUM
Creatinine, Ser: 0.65 mg/dL (ref 0.50–1.10)
GFR calc Af Amer: >90 mL/min (ref >90)
GFR, EST NON AFRICAN AMERICAN: 78 mL/min — AB (ref >90)

## 2013-02-16 LAB — AMMONIA: Ammonia: 31 umol/L (ref 11–60)

## 2013-02-16 LAB — MRSA PCR SCREENING: MRSA by PCR: NEGATIVE

## 2013-02-16 LAB — TSH: TSH: 2.039 u[IU]/mL (ref 0.350–4.500)

## 2013-02-16 LAB — VITAMIN B12: Vitamin B-12: 353 pg/mL (ref 211–911)

## 2013-02-16 SURGERY — OPEN REDUCTION INTERNAL FIXATION (ORIF) DISTAL RADIUS FRACTURE
Anesthesia: General | Site: Arm Lower | Laterality: Left

## 2013-02-16 MED ORDER — HYDROCODONE-ACETAMINOPHEN 5-325 MG PO TABS
1.0000 | ORAL_TABLET | ORAL | Status: DC | PRN
  Administered 2013-02-17 (×3): 1 via ORAL
  Filled 2013-02-16 (×3): qty 1

## 2013-02-16 MED ORDER — ONDANSETRON HCL 4 MG/2ML IJ SOLN: 4.0000 mg | Freq: Four times a day (QID) | INTRAMUSCULAR | Status: DC | PRN

## 2013-02-16 MED ORDER — CEFAZOLIN SODIUM-DEXTROSE 2-3 GM-% IV SOLR
INTRAVENOUS | Status: AC
  Administered 2013-02-16: 2 g via INTRAVENOUS
  Filled 2013-02-16: qty 50

## 2013-02-16 MED ORDER — SODIUM CHLORIDE 0.9 % IV SOLN
Freq: Once | INTRAVENOUS | Status: AC
  Administered 2013-02-16: 13:00:00 via INTRAVENOUS

## 2013-02-16 MED ORDER — PHENYLEPHRINE HCL 10 MG/ML IJ SOLN
INTRAMUSCULAR | Status: DC | PRN
  Administered 2013-02-16: 80 ug via INTRAVENOUS
  Administered 2013-02-16: 120 ug via INTRAVENOUS
  Administered 2013-02-16: 80 ug via INTRAVENOUS

## 2013-02-16 MED ORDER — PROPOFOL 10 MG/ML IV BOLUS
INTRAVENOUS | Status: DC | PRN
  Administered 2013-02-16: 100 mg via INTRAVENOUS

## 2013-02-16 MED ORDER — PHENELZINE SULFATE 15 MG PO TABS
7.5000 mg | ORAL_TABLET | Freq: Three times a day (TID) | ORAL | Status: DC
  Administered 2013-02-17 – 2013-02-18 (×3): 7.5 mg via ORAL
  Filled 2013-02-16 (×8): qty 1

## 2013-02-16 MED ORDER — LACTATED RINGERS IV SOLN
INTRAVENOUS | Status: DC
  Administered 2013-02-16: 18:00:00 via INTRAVENOUS

## 2013-02-16 MED ORDER — MORPHINE SULFATE 2 MG/ML IJ SOLN
2.0000 mg | INTRAMUSCULAR | Status: DC | PRN
  Administered 2013-02-16 (×2): 2 mg via INTRAVENOUS
  Filled 2013-02-16: qty 1

## 2013-02-16 MED ORDER — LACTATED RINGERS IV SOLN
INTRAVENOUS | Status: DC | PRN
  Administered 2013-02-16 (×2): via INTRAVENOUS

## 2013-02-16 MED ORDER — ONDANSETRON HCL 4 MG PO TABS: 4.0000 mg | ORAL_TABLET | Freq: Four times a day (QID) | ORAL | Status: DC | PRN

## 2013-02-16 MED ORDER — MERCAPTOPURINE 50 MG PO TABS
25.0000 mg | ORAL_TABLET | ORAL | Status: DC
  Filled 2013-02-16 (×2): qty 1

## 2013-02-16 MED ORDER — CEFAZOLIN SODIUM 1-5 GM-% IV SOLN
1.0000 g | Freq: Three times a day (TID) | INTRAVENOUS | Status: AC
  Administered 2013-02-17 (×3): 1 g via INTRAVENOUS
  Filled 2013-02-16 (×3): qty 50

## 2013-02-16 MED ORDER — CLINDAMYCIN PHOSPHATE 600 MG/50ML IV SOLN
600.0000 mg | INTRAVENOUS | Status: AC
  Filled 2013-02-16: qty 50

## 2013-02-16 MED ORDER — FENTANYL CITRATE 0.05 MG/ML IJ SOLN
25.0000 ug | Freq: Once | INTRAMUSCULAR | Status: AC
  Administered 2013-02-16: 25 ug via INTRAVENOUS
  Filled 2013-02-16: qty 2

## 2013-02-16 MED ORDER — SODIUM CHLORIDE 0.9 % IV SOLN
INTRAVENOUS | Status: AC
  Administered 2013-02-16: 15:00:00 via INTRAVENOUS

## 2013-02-16 MED ORDER — HEPARIN SODIUM (PORCINE) 5000 UNIT/ML IJ SOLN
5000.0000 [IU] | Freq: Three times a day (TID) | INTRAMUSCULAR | Status: DC
  Administered 2013-02-17 – 2013-02-18 (×5): 5000 [IU] via SUBCUTANEOUS
  Filled 2013-02-16 (×9): qty 1

## 2013-02-16 MED ORDER — ACETAMINOPHEN 325 MG PO TABS: 650.0000 mg | ORAL_TABLET | Freq: Four times a day (QID) | ORAL | Status: DC | PRN

## 2013-02-16 MED ORDER — IRBESARTAN 300 MG PO TABS
300.0000 mg | ORAL_TABLET | Freq: Every day | ORAL | Status: DC
Start: 2013-02-16 — End: 2013-02-18
  Administered 2013-02-17 – 2013-02-18 (×2): 300 mg via ORAL
  Filled 2013-02-16 (×3): qty 1

## 2013-02-16 MED ORDER — FENTANYL CITRATE 0.05 MG/ML IJ SOLN
INTRAMUSCULAR | Status: DC | PRN
  Administered 2013-02-16: 100 ug via INTRAVENOUS
  Administered 2013-02-16 (×2): 25 ug via INTRAVENOUS

## 2013-02-16 MED ORDER — SUCCINYLCHOLINE CHLORIDE 20 MG/ML IJ SOLN
INTRAMUSCULAR | Status: DC | PRN
  Administered 2013-02-16: 40 mg via INTRAVENOUS

## 2013-02-16 MED ORDER — LEVOTHYROXINE SODIUM 50 MCG PO TABS
50.0000 ug | ORAL_TABLET | Freq: Every day | ORAL | Status: DC
  Administered 2013-02-17 – 2013-02-18 (×2): 50 ug via ORAL
  Filled 2013-02-16 (×4): qty 1

## 2013-02-16 MED ORDER — ASPIRIN EC 81 MG PO TBEC
81.0000 mg | DELAYED_RELEASE_TABLET | Freq: Every day | ORAL | Status: DC
  Administered 2013-02-17 – 2013-02-18 (×2): 81 mg via ORAL
  Filled 2013-02-16 (×3): qty 1

## 2013-02-16 MED ORDER — MORPHINE SULFATE 2 MG/ML IJ SOLN
INTRAMUSCULAR | Status: AC
  Filled 2013-02-16: qty 1

## 2013-02-16 MED ORDER — LIDOCAINE HCL (CARDIAC) 20 MG/ML IV SOLN
INTRAVENOUS | Status: DC | PRN
  Administered 2013-02-16: 100 mg via INTRAVENOUS

## 2013-02-16 MED ORDER — ONDANSETRON HCL 4 MG/2ML IJ SOLN
INTRAMUSCULAR | Status: DC | PRN
  Administered 2013-02-16: 4 mg via INTRAVENOUS

## 2013-02-16 MED ORDER — SODIUM CHLORIDE 0.9 % IJ SOLN
3.0000 mL | Freq: Two times a day (BID) | INTRAMUSCULAR | Status: DC
  Administered 2013-02-17 – 2013-02-18 (×2): 3 mL via INTRAVENOUS

## 2013-02-16 MED ORDER — ACETAMINOPHEN 650 MG RE SUPP: 650.0000 mg | Freq: Four times a day (QID) | RECTAL | Status: DC | PRN

## 2013-02-16 MED ORDER — HYDRALAZINE HCL 20 MG/ML IJ SOLN: 10.0000 mg | Freq: Four times a day (QID) | INTRAMUSCULAR | Status: DC | PRN

## 2013-02-16 SURGICAL SUPPLY — 69 items
BANDAGE ELASTIC 3 VELCRO ST LF (GAUZE/BANDAGES/DRESSINGS) ×3 IMPLANT
BANDAGE ELASTIC 4 VELCRO ST LF (GAUZE/BANDAGES/DRESSINGS) ×3 IMPLANT
BANDAGE GAUZE ELAST BULKY 4 IN (GAUZE/BANDAGES/DRESSINGS) ×3 IMPLANT
BIT DRILL 2.2 SS TIBIAL (BIT) ×3 IMPLANT
BLADE SURG ROTATE 9660 (MISCELLANEOUS) IMPLANT
BNDG ESMARK 4X9 LF (GAUZE/BANDAGES/DRESSINGS) IMPLANT
CANISTER SUCTION 2500CC (MISCELLANEOUS) ×3 IMPLANT
CLOSURE WOUND 1/2 X4 (GAUZE/BANDAGES/DRESSINGS)
CLOTH BEACON ORANGE TIMEOUT ST (SAFETY) ×3 IMPLANT
CORDS BIPOLAR (ELECTRODE) ×3 IMPLANT
COVER SURGICAL LIGHT HANDLE (MISCELLANEOUS) ×3 IMPLANT
CUFF TOURNIQUET SINGLE 18IN (TOURNIQUET CUFF) ×3 IMPLANT
CUFF TOURNIQUET SINGLE 24IN (TOURNIQUET CUFF) IMPLANT
DRAIN TLS ROUND 10FR (DRAIN) IMPLANT
DRAPE OEC MINIVIEW 54X84 (DRAPES) ×3 IMPLANT
DRAPE SURG 17X11 SM STRL (DRAPES) ×3 IMPLANT
DRSG ADAPTIC 3X8 NADH LF (GAUZE/BANDAGES/DRESSINGS) ×3 IMPLANT
ELECT REM PT RETURN 9FT ADLT (ELECTROSURGICAL)
ELECTRODE REM PT RTRN 9FT ADLT (ELECTROSURGICAL) IMPLANT
GAUZE SPONGE 4X4 16PLY XRAY LF (GAUZE/BANDAGES/DRESSINGS) ×3 IMPLANT
GAUZE XEROFORM 1X8 LF (GAUZE/BANDAGES/DRESSINGS) ×3 IMPLANT
GLOVE BIOGEL M 6.5 STRL (GLOVE) ×6 IMPLANT
GLOVE BIOGEL PI IND STRL 8.5 (GLOVE) ×1 IMPLANT
GLOVE BIOGEL PI INDICATOR 8.5 (GLOVE) ×2
GLOVE SURG ORTHO 8.0 STRL STRW (GLOVE) ×3 IMPLANT
GOWN PREVENTION PLUS XLARGE (GOWN DISPOSABLE) ×3 IMPLANT
GOWN STRL NON-REIN LRG LVL3 (GOWN DISPOSABLE) ×3 IMPLANT
K-WIRE 1.6 (WIRE) ×2
K-WIRE FX5X1.6XNS BN SS (WIRE) ×1
KIT BASIN OR (CUSTOM PROCEDURE TRAY) ×3 IMPLANT
KIT ROOM TURNOVER OR (KITS) ×3 IMPLANT
KWIRE FX5X1.6XNS BN SS (WIRE) ×1 IMPLANT
MANIFOLD NEPTUNE II (INSTRUMENTS) IMPLANT
NEEDLE HYPO 25X1 1.5 SAFETY (NEEDLE) ×3 IMPLANT
NS IRRIG 1000ML POUR BTL (IV SOLUTION) ×3 IMPLANT
PACK ORTHO EXTREMITY (CUSTOM PROCEDURE TRAY) ×3 IMPLANT
PAD ARMBOARD 7.5X6 YLW CONV (MISCELLANEOUS) ×6 IMPLANT
PAD CAST 4YDX4 CTTN HI CHSV (CAST SUPPLIES) ×1 IMPLANT
PADDING CAST ABS 4INX4YD NS (CAST SUPPLIES) ×2
PADDING CAST ABS COTTON 4X4 ST (CAST SUPPLIES) ×1 IMPLANT
PADDING CAST COTTON 4X4 STRL (CAST SUPPLIES) ×2
PEG LOCKING SMOOTH 2.2X16 (Screw) ×3 IMPLANT
PEG LOCKING SMOOTH 2.2X18 (Peg) ×6 IMPLANT
PEG LOCKING SMOOTH 2.2X20 (Screw) ×9 IMPLANT
PLATE NARROW DVR LEFT (Plate) ×3 IMPLANT
SCREW  LP NL 2.7X15MM (Screw) ×4 IMPLANT
SCREW 2.7X14MM (Screw) ×4 IMPLANT
SCREW BN 14X2.7XNONLOCK 3 LD (Screw) ×2 IMPLANT
SCREW LOCK 14X2.7X 3 LD TPR (Screw) ×2 IMPLANT
SCREW LOCKING 2.7X14 (Screw) ×4 IMPLANT
SCREW LP NL 2.7X15MM (Screw) ×2 IMPLANT
SOAP 2 % CHG 4 OZ (WOUND CARE) ×3 IMPLANT
SPLINT FIBERGLASS 3X35 (CAST SUPPLIES) ×3 IMPLANT
SPONGE GAUZE 4X4 12PLY (GAUZE/BANDAGES/DRESSINGS) ×3 IMPLANT
SPONGE LAP 4X18 X RAY DECT (DISPOSABLE) ×3 IMPLANT
STRIP CLOSURE SKIN 1/2X4 (GAUZE/BANDAGES/DRESSINGS) IMPLANT
SUT ETHILON 4 0 PS 2 18 (SUTURE) ×3 IMPLANT
SUT MNCRL AB 4-0 PS2 18 (SUTURE) IMPLANT
SUT PROLENE 4 0 PS 2 18 (SUTURE) ×6 IMPLANT
SUT VIC AB 2-0 FS1 27 (SUTURE) IMPLANT
SUT VICRYL 4-0 PS2 18IN ABS (SUTURE) ×3 IMPLANT
SYR CONTROL 10ML LL (SYRINGE) IMPLANT
SYSTEM CHEST DRAIN TLS 7FR (DRAIN) IMPLANT
TOWEL OR 17X24 6PK STRL BLUE (TOWEL DISPOSABLE) ×3 IMPLANT
TOWEL OR 17X26 10 PK STRL BLUE (TOWEL DISPOSABLE) ×3 IMPLANT
TUBE CONNECTING 12'X1/4 (SUCTIONS) ×1
TUBE CONNECTING 12X1/4 (SUCTIONS) ×2 IMPLANT
WATER STERILE IRR 1000ML POUR (IV SOLUTION) ×3 IMPLANT
YANKAUER SUCT BULB TIP NO VENT (SUCTIONS) IMPLANT

## 2013-02-16 NOTE — Brief Op Note (Signed)
02/16/2013  7:12 PM  PATIENT:  Marisa Smith  80 y.o. female  PRE-OPERATIVE DIAGNOSIS:  Left Distal Radius Fx  POST-OPERATIVE DIAGNOSIS:  SAME  PROCEDURE:  Procedure(s): OPEN REDUCTION INTERNAL FIXATION (ORIF) DISTAL RADIAL FRACTURE (Left) CLOSED MANIPULATION LEFT SHOULDER  SURGEON:  Surgeon(s) and Role:    * Sharma CovertFred W Saunders [place], MD - Primary  PHYSICIAN ASSISTANT: none  ASSISTANTS: none   ANESTHESIA:   none  EBL:   minimal  BLOOD ADMINISTERED:none  DRAINS: none   LOCAL MEDICATIONS USED:  MARCAINE     SPECIMEN:  No Specimen  DISPOSITION OF SPECIMEN:  N/A  COUNTS:  YES  TOURNIQUET: LESS THAN 35 MINUTES   DICTATION: .[id number]  PLAN OF CARE: Admit to inpatient   PATIENT DISPOSITION:  PACU - hemodynamically stable.   Delay start of Pharmacological VTE agent (>24hrs) due to surgical blood loss or risk of bleeding: not applicable

## 2013-02-16 NOTE — ED Notes (Signed)
[facility]- ortho informed of short arm splint. sts has 5 calls ahead and will come down as soon as possible. Dr. Ander GasterHorner made aware.

## 2013-02-16 NOTE — ED Notes (Addendum)
Pt lives at home alone. Neighbor heard her screaming; laceration on L head behind ear; bleeding controlled. Neighbor says she is altered; possible L shoulder dislocation; family OTW. Pt states she was trying to take her meds when she fell. 100 fentanyl and 4 zofran given at scene.

## 2013-02-16 NOTE — ED Notes (Signed)
Spoke to ortho about patient short arm splint- sts is on way.

## 2013-02-16 NOTE — ED Notes (Signed)
Daughter at bedside updated on plan of care.  

## 2013-02-16 NOTE — ED Provider Notes (Signed)
CSN: [id number]     Arrival date & time 02/16/13  0854 History   First MD Initiated Contact with Patient 02/16/13 [phone]     Chief Complaint  Patient presents with  . Fall   (Consider location/radiation/quality/duration/timing/severity/associated sxs/prior Treatment) HPI  This is an 80 yo female with a history of hypertension, depression who presents following a fall. Per EMS, patient was found by her neighbor on the floor. Patient initially reported a mechanical fall; however, patient is not oriented at this time and is unable to give me a history. She has obvious deformity to her proximal humerus and distal radius on the left. Per the patient's daughter, patient takes aspirin. She lives independently but does not drive or cook for herself.  Level V caveat for altered mental status  Past Medical History  Diagnosis Date  . Unspecified vitamin D deficiency   . Headache(784.0) 02/15/2010  . ALLERGIC RHINITIS CAUSE UNSPECIFIED   . ANXIETY   . DEPRESSION, MAJOR, MODERATE   . HYPERTENSION, BENIGN ESSENTIAL   . HYPOTHYROIDISM   . OSTEOPOROSIS   . Autoimmune hepatitis     on MP6, prev pred   Past Surgical History  Procedure Laterality Date  . Cataract extraction  2008    Left eye  . Cataract extraction  2009    Right eye  . Tonsillectomy  1945   Family History  Problem Relation Age of Onset  . Hypertension Mother   . Stroke Mother   . Heart disease Father   . Stroke Sister    History  Substance Use Topics  . Smoking status: Never Smoker   . Smokeless tobacco: Never Used     Comment: Married, Retired- daughter is Okey Regal [place] responsible for majority of supervison for parents  . Alcohol Use: No   OB History   Grav Para Term Preterm Abortions TAB SAB Ect Mult Living                 Review of Systems  Unable to perform ROS: Mental status change    Allergies  Amlodipine besylate; Azathioprine; Penicillins; Sertraline hcl; and Sulfonamide derivatives  Home  Medications   No current outpatient prescriptions on file. BP 186/94  Pulse 90  Temp(Src) 97.7 F (36.5 C) (Oral)  Resp 22  Ht 5\' 6"  (1.676 m)  Wt 104 lb 0.9 oz (47.2 kg)  BMI 16.80 kg/m2  SpO2 95% Physical Exam  Nursing note and vitals reviewed. Constitutional: No distress.  Frail, elderly  HENT:  Head: Normocephalic.  Right Ear: External ear normal.  Left Ear: External ear normal.  1 cm laceration just above the left ear  Eyes: Pupils are equal, round, and reactive to light.  Neck: Normal range of motion. Neck supple.  No midline tenderness to palpate  Cardiovascular: Normal rate, regular rhythm and normal heart sounds.   No murmur heard. Pulmonary/Chest: Effort normal and breath sounds normal. No respiratory distress. She has no wheezes.  Abdominal: Soft. Bowel sounds are normal. There is no tenderness. There is no rebound.  Musculoskeletal:  Normal range of motion of the bilateral hips, pelvis stable, obvious deformity to the proximal left humerus and distal radius, 2+ radial pulse palpated  Neurological: She is alert.  4 extremities spontaneously  Skin: Skin is warm and dry.  Psychiatric:  delirious    ED Course  Procedures (including critical care time) Labs Review Labs Reviewed  CBC WITH DIFFERENTIAL - Abnormal; Notable for the following:    RBC 3.41 (*)  Hemoglobin 11.7 (*)    HCT 32.7 (*)    MCH 34.3 (*)    Neutrophils Relative % 79 (*)    Lymphocytes Relative 10 (*)    Lymphs Abs 0.6 (*)    All other components within normal limits  BASIC METABOLIC PANEL - Abnormal; Notable for the following:    Sodium 132 (*)    Chloride 93 (*)    Glucose, Bld 154 (*)    GFR calc non Af Amer 75 (*)    GFR calc Af Amer 87 (*)    All other components within normal limits  URINALYSIS, ROUTINE W REFLEX MICROSCOPIC - Abnormal; Notable for the following:    Glucose, UA 100 (*)    Ketones, ur 40 (*)    All other components within normal limits  CBC  CREATININE,  SERUM  TSH  AMMONIA  VITAMIN B12  FOLATE RBC   Imaging Review Dg Wrist Complete Left  02/16/2013   CLINICAL DATA:  Fall, bruising and swelling left wrist  EXAM: LEFT WRIST - COMPLETE 3+ VIEW  COMPARISON:  None  FINDINGS: Osseous demineralization.  Comminuted displaced distal left radial metaphyseal fracture with apex volar angulation.  Intra-articular extension at the radiocarpal joint and question the distal radioulnar joint.  Secondary ulnar plus variance.  Degenerative changes at distal pole scaphoid.  Associated soft tissue swelling at wrist.  Questionable tiny avulsion fracture at tip of ulnar styloid process.  No additional fracture, dislocation or bone destruction.  Scattered vascular calcifications.  IMPRESSION: Comminuted displaced and angulated distal left radial metaphyseal fracture with intra-articular extension at the radiocarpal and question distal radioulnar joints.  Suspect tiny avulsion fracture tip of ulnar styloid.   Electronically Signed   By: Ulyses Southward M.D.   On: 02/16/2013 10:20   Ct Head Wo Contrast  02/16/2013   CLINICAL DATA:  Status post fall, but able to control movements  EXAM: CT HEAD WITHOUT CONTRAST  CT CERVICAL SPINE WITHOUT CONTRAST  TECHNIQUE: Multidetector CT imaging of the head and cervical spine was performed following the standard protocol without intravenous contrast. Multiplanar CT image reconstructions of the cervical spine were also generated.  COMPARISON:  MRI the brain of November 24, 2011 and CT scan of the brain of November 23, 2011.  FINDINGS: CT HEAD FINDINGS  There is mild diffuse cerebral and cerebellar atrophy with compensatory ventriculomegaly which has progressed slightly since the previous CT scan. There is decreased density in the deep white matter of both cerebral hemispheres consistent with chronic small vessel ischemic type change. There is no acute intracranial hemorrhage nor evidence of an evolving ischemic infarction. The cerebellum and  brainstem exhibit no acute abnormalities.  At bone window settings the observed portions of the paranasal sinuses and right mastoid air cells are clear. On the left there is some fluid within the mastoid air cells. There is no evidence of an acute skull fracture.  CT CERVICAL SPINE FINDINGS  The cervical vertebral bodies are preserved in height. There is disc space narrowing at C4-5 and to a lesser extent at C5-6 and C6-7. There are posterior endplate osteophytes at these levels. The prevertebral soft tissue spaces appear normal. There is no evidence of a perched facet or facet or spinous process fracture. The odontoid is intact and the lateral masses of C1 align normally with those of C2. The bony ring at each cervical level is intact. There is facet joint degenerative change at multiple levels.  The soft tissues of the neck exhibit no  evidence of hematomas. The pulmonary apices exhibit fibrotic change but no pneumothorax. The observed portions of the first and second ribs appear intact.  IMPRESSION: 1. There is no evidence of an acute intracranial hemorrhage nor of an acute ischemic infarction. 2. There are age related white matter hypodensities consistent with chronic small vessel ischemic change. 3. There is no evidence of an acute skull fracture. There is chronic opacification within left mastoid air cells which may reflect inflammation. 4. There are degenerative changes of the cervical spine but there is no evidence of an acute fracture nor dislocation.   Electronically Signed   By: David  [place]   On: 02/16/2013 11:40   Ct Cervical Spine Wo Contrast  02/16/2013   CLINICAL DATA:  Status post fall, but able to control movements  EXAM: CT HEAD WITHOUT CONTRAST  CT CERVICAL SPINE WITHOUT CONTRAST  TECHNIQUE: Multidetector CT imaging of the head and cervical spine was performed following the standard protocol without intravenous contrast. Multiplanar CT image reconstructions of the cervical spine were also  generated.  COMPARISON:  MRI the brain of November 24, 2011 and CT scan of the brain of November 23, 2011.  FINDINGS: CT HEAD FINDINGS  There is mild diffuse cerebral and cerebellar atrophy with compensatory ventriculomegaly which has progressed slightly since the previous CT scan. There is decreased density in the deep white matter of both cerebral hemispheres consistent with chronic small vessel ischemic type change. There is no acute intracranial hemorrhage nor evidence of an evolving ischemic infarction. The cerebellum and brainstem exhibit no acute abnormalities.  At bone window settings the observed portions of the paranasal sinuses and right mastoid air cells are clear. On the left there is some fluid within the mastoid air cells. There is no evidence of an acute skull fracture.  CT CERVICAL SPINE FINDINGS  The cervical vertebral bodies are preserved in height. There is disc space narrowing at C4-5 and to a lesser extent at C5-6 and C6-7. There are posterior endplate osteophytes at these levels. The prevertebral soft tissue spaces appear normal. There is no evidence of a perched facet or facet or spinous process fracture. The odontoid is intact and the lateral masses of C1 align normally with those of C2. The bony ring at each cervical level is intact. There is facet joint degenerative change at multiple levels.  The soft tissues of the neck exhibit no evidence of hematomas. The pulmonary apices exhibit fibrotic change but no pneumothorax. The observed portions of the first and second ribs appear intact.  IMPRESSION: 1. There is no evidence of an acute intracranial hemorrhage nor of an acute ischemic infarction. 2. There are age related white matter hypodensities consistent with chronic small vessel ischemic change. 3. There is no evidence of an acute skull fracture. There is chronic opacification within left mastoid air cells which may reflect inflammation. 4. There are degenerative changes of the cervical  spine but there is no evidence of an acute fracture nor dislocation.   Electronically Signed   By: David  [place]   On: 02/16/2013 11:40   Dg Chest Portable 1 View  02/16/2013   CLINICAL DATA:  Fall  EXAM: PORTABLE CHEST - 1 VIEW  COMPARISON:  None.  FINDINGS: Cardiomediastinal silhouette is unremarkable. Hyperinflation. Probable chronic mild interstitial prominence. No acute infiltrate or pulmonary edema.  IMPRESSION: Hyperinflation. No active disease. Probable chronic mild interstitial prominence.   Electronically Signed   By: Natasha Mead M.D.   On: 02/16/2013 13:14   Dg Shoulder  Left  02/16/2013   CLINICAL DATA:  Fall.  EXAM: LEFT SHOULDER - 2+ VIEW  COMPARISON:  None.  FINDINGS: Angulated fracture of the proximal left humerus at the diaphyseal metaphyseal junction is normal. Comminution is present. Prominent soft tissue swelling is present . No other focal abnormality identified  IMPRESSION: Angulated comminuted fracture of proximal left humerus.   Electronically Signed   By: Maisie Fushomas  Register   On: 02/16/2013 10:20    EKG Interpretation    Date/Time:  Wednesday February 16 2013 09:45:30 EST Ventricular Rate:  87 PR Interval:  195 QRS Duration: 88 QT Interval:  374 QTC Calculation: 450 R Axis:   87 Text Interpretation:  Sinus rhythm Bo rderline right axis deviation Borderline repolarization abnormality Confirmed by HORTON  MD, COURTNEY ([id number]) on 02/16/2013 11:12:06 AM            MDM   1. Fall at home   2. Distal radius fracture   3. Humerus fracture   4. Delirium    Patient presents with fall from home. This is unclear whether the fall was mechanical. Patient is currently disoriented and mildly delirious. She did receive 100 mcg Fentanyl in route. She has obvious deformity of the left humerus and left wrist. EKG is reassuring. Basic labwork was obtained and is unremarkable. Plain films are notable for distal radius fracture and humerus fracture. Discussed with Dr. Orlan Leavensrtman who states  that patient can be splinted in a sugar tong splint. She will require surgery.  Also discussed with general orthopedics he will discuss operative plan with Dr. Orlan Leavensrtman.  Given acute delirium without medical calls, patient will be admitted to the hospital service.   Shon Batonourtney F Horton, MD 02/16/13 [phone]

## 2013-02-16 NOTE — ED Notes (Signed)
Pt fell before she was able to take meds this AM

## 2013-02-16 NOTE — Progress Notes (Signed)
Orthopedic Tech Progress Note Patient Details:  Isidore Moosnnie Nile Feb 28, 1924 [id number]  Ortho Devices Type of Ortho Device: Ace wrap;Arm sling;Sugartong splint Ortho Device/Splint Location: lue Ortho Device/Splint Interventions: Application As ordered by Dr. Marliss CootsHorton  Virgina Deakins 02/16/2013, 2:08 PM

## 2013-02-16 NOTE — Progress Notes (Signed)
Report given to Dave, CRNA 

## 2013-02-16 NOTE — Anesthesia Postprocedure Evaluation (Signed)
  Anesthesia Post-op Note  Patient: Marisa Smith  Procedure(s) Performed: Procedure(s): OPEN REDUCTION INTERNAL FIXATION (ORIF) DISTAL RADIAL FRACTURE (Left)  Patient Location: PACU  Anesthesia Type:General  Level of Consciousness: sedated and patient cooperative  Airway and Oxygen Therapy: Patient Spontanous Breathing  Post-op Pain: none  Post-op Assessment: Post-op Vital signs reviewed, Patient's Cardiovascular Status Stable, Respiratory Function Stable, Patent Airway, No signs of Nausea or vomiting and Pain level controlled  Post-op Vital Signs: stable  Complications: No apparent anesthesia complications

## 2013-02-16 NOTE — ED Notes (Signed)
IN and Out attempted by tech and RN failed; MD aware; pt to CT. Will attempt again when pt returns.

## 2013-02-16 NOTE — ED Provider Notes (Signed)
LACERATION REPAIR Performed by: Johnnette GourdAlbert, Michela Herst Authorized by: Johnnette GourdAlbert, Varian Innes Consent: Verbal consent obtained. Risks and benefits: risks, benefits and alternatives were discussed Consent given by: patient Patient identity confirmed: provided demographic data Prepped and Draped in normal sterile fashion Wound explored  Laceration Location: left side of scalp  Laceration Length: 1 cm  No Foreign Bodies seen or palpated  Anesthesia: none  Irrigation method: syringe Amount of cleaning: standard  Skin closure: staple  Number of staples: 1  Technique: staples  Patient tolerance: Patient tolerated the procedure well with no immediate complications.   Trevor MaceRobyn M Albert, PA-C 02/16/13 1044

## 2013-02-16 NOTE — Transfer of Care (Signed)
Immediate Anesthesia Transfer of Care Note  Patient: Marisa Smith  Procedure(s) Performed: Procedure(s): OPEN REDUCTION INTERNAL FIXATION (ORIF) DISTAL RADIAL FRACTURE (Left)  Patient Location: PACU  Anesthesia Type:General  Level of Consciousness: awake and patient cooperative- as preop baseline. Pt disoriented preop  Airway & Oxygen Therapy: Patient Spontanous Breathing and Patient connected to nasal cannula oxygen  Post-op Assessment: Report given to PACU RN, Post -op Vital signs reviewed and stable and Patient moving all extremities  Post vital signs: Reviewed and stable  Complications: No apparent anesthesia complications

## 2013-02-16 NOTE — ED Notes (Signed)
Phlebotomy at bedside; pt and family reassured. Family updated on POC.

## 2013-02-16 NOTE — ED Notes (Signed)
Dr. Mikhail at bedside.

## 2013-02-16 NOTE — H&P (Signed)
[facility] History and Physical  Marisa Smith ZOX:[id number] DOB: 13-Sep-1924 DOA: 02/16/2013  Referring physician:  PCP: Rene PaciValerie Leschber, MD  Specialists:   Chief Complaint: Fall  HPI: Marisa Smith is a 80 y.o. female  This is an 80 year old female with a history of hypertension, depression that presents status post fall. The patient was found on the floor by her neighbor who called EMS. Patient initially reported a mechanical fall however at this time patient is on able to answer any questions or to a given history. No family members are present. Patient was found to have a deformity of proximal humerus and distal radius on the left side. Per ER attending, patient's daughter was present initially and today the patient does live independently however does not drive or cook for herself however is able to tend to some of her daily activities.  Review of Systems:  Unable to obtain due to patient's current state.  Past Medical History  Diagnosis Date  . Unspecified vitamin D deficiency   . Headache(784.0) 02/15/2010  . ALLERGIC RHINITIS CAUSE UNSPECIFIED   . ANXIETY   . DEPRESSION, MAJOR, MODERATE   . HYPERTENSION, BENIGN ESSENTIAL   . HYPOTHYROIDISM   . OSTEOPOROSIS   . Autoimmune hepatitis     on MP6, prev pred   Past Surgical History  Procedure Laterality Date  . Cataract extraction  2008    Left eye  . Cataract extraction  2009    Right eye  . Tonsillectomy  1945   Social History:  reports that she has never smoked. She has never used smokeless tobacco. She reports that she does not drink alcohol or use illicit drugs. Lives at home alone, is able to do some of her daily activities, does not drive or cook for herself.  Allergies  Allergen Reactions  . Amlodipine Besylate     REACTION: itching  . Azathioprine     REACTION: nausea  . Penicillins     REACTION: yeast infection  . Sertraline Hcl     REACTION: rash  . Sulfonamide Derivatives     REACTION: swelling  and itching    Family History  Problem Relation Age of Onset  . Hypertension Mother   . Stroke Mother   . Heart disease Father   . Stroke Sister     Prior to Admission medications   Medication Sig Start Date End Date Taking? Authorizing Provider  aspirin EC 81 MG EC tablet Take 81 mg by mouth daily.    Yes Historical Provider, MD  calcium gluconate 500 MG tablet Take 500 mg by mouth daily.     Yes Historical Provider, MD  Cholecalciferol (VITAMIN D3) 2000 UNITS TABS Take 2,000 Units by mouth daily.     Yes Historical Provider, MD  levothyroxine (SYNTHROID, LEVOTHROID) 50 MCG tablet Take 50 mcg by mouth daily before breakfast.   Yes Historical Provider, MD  LORazepam (ATIVAN) 0.5 MG tablet Take 0.5 mg by mouth 2 (two) times daily.    Yes Historical Provider, MD  mercaptopurine (PURINETHOL) 50 MG tablet Take 25 mg by mouth daily. Give on an empty stomach 1 hour before or 2 hours after meals. Caution: Chemotherapy.   Yes Historical Provider, MD  mirtazapine (REMERON) 45 MG tablet Take 1 tablet (45 mg total) by mouth at bedtime. 02/06/12  Yes Newt LukesValerie A Leschber, MD  olmesartan (BENICAR) 40 MG tablet Take 40 mg by mouth daily.   Yes Historical Provider, MD  phenelzine (NARDIL) 15 MG tablet Take 7.5 mg by  mouth 3 (three) times daily.   Yes Historical Provider, MD   Physical Exam: Filed Vitals:   02/16/13 1548  BP: 135/80  Pulse: 89  Temp:   Resp:      General: Well developed, elderly, frail, distressed,  appears stated age  HEENT: Equality, laceration noted above the left ear, PERRLA, EOMI, Anicteic Sclera, mucous membranes moist. No pharyngeal erythema or exudates  Neck: Supple, no JVD, no masses  Cardiovascular: S1 S2 auscultated, no rubs, murmurs or gallops. Regular rate and rhythm.  Respiratory: Clear to auscultation bilaterally with equal chest rise  Abdomen: Soft, nontender, nondistended, + bowel sounds  Extremities: warm dry without cyanosis clubbing or edema, deformity noted of  the proximal left humerus and distal radius  Neuro: Patient is alert and oriented to self only, is able to follow directions, cranial nerves 2 through 12 intact as tested.  Skin: Without rashes exudates or nodules  Psych: Unable to assess.  Labs on Admission:  Basic Metabolic Panel:  Recent Labs Lab 02/16/13 1028  NA 132*  K 3.9  CL 93*  CO2 21  GLUCOSE 154*  BUN 20  CREATININE 0.74  CALCIUM 9.0   Liver Function Tests: No results found for this basename: AST, ALT, ALKPHOS, BILITOT, PROT, ALBUMIN,  in the last 168 hours No results found for this basename: LIPASE, AMYLASE,  in the last 168 hours No results found for this basename: AMMONIA,  in the last 168 hours CBC:  Recent Labs Lab 02/16/13 1028  WBC 6.3  NEUTROABS 5.0  HGB 11.7*  HCT 32.7*  MCV 95.9  PLT 152   Cardiac Enzymes: No results found for this basename: CKTOTAL, CKMB, CKMBINDEX, TROPONINI,  in the last 168 hours  BNP (last 3 results) No results found for this basename: PROBNP,  in the last 8760 hours CBG: No results found for this basename: GLUCAP,  in the last 168 hours  Radiological Exams on Admission: Dg Wrist Complete Left  02/16/2013   CLINICAL DATA:  Fall, bruising and swelling left wrist  EXAM: LEFT WRIST - COMPLETE 3+ VIEW  COMPARISON:  None  FINDINGS: Osseous demineralization.  Comminuted displaced distal left radial metaphyseal fracture with apex volar angulation.  Intra-articular extension at the radiocarpal joint and question the distal radioulnar joint.  Secondary ulnar plus variance.  Degenerative changes at distal pole scaphoid.  Associated soft tissue swelling at wrist.  Questionable tiny avulsion fracture at tip of ulnar styloid process.  No additional fracture, dislocation or bone destruction.  Scattered vascular calcifications.  IMPRESSION: Comminuted displaced and angulated distal left radial metaphyseal fracture with intra-articular extension at the radiocarpal and question distal  radioulnar joints.  Suspect tiny avulsion fracture tip of ulnar styloid.   Electronically Signed   By: Ulyses Southward M.D.   On: 02/16/2013 10:20   Ct Head Wo Contrast  02/16/2013   CLINICAL DATA:  Status post fall, but able to control movements  EXAM: CT HEAD WITHOUT CONTRAST  CT CERVICAL SPINE WITHOUT CONTRAST  TECHNIQUE: Multidetector CT imaging of the head and cervical spine was performed following the standard protocol without intravenous contrast. Multiplanar CT image reconstructions of the cervical spine were also generated.  COMPARISON:  MRI the brain of November 24, 2011 and CT scan of the brain of November 23, 2011.  FINDINGS: CT HEAD FINDINGS  There is mild diffuse cerebral and cerebellar atrophy with compensatory ventriculomegaly which has progressed slightly since the previous CT scan. There is decreased density in the deep white matter of  both cerebral hemispheres consistent with chronic small vessel ischemic type change. There is no acute intracranial hemorrhage nor evidence of an evolving ischemic infarction. The cerebellum and brainstem exhibit no acute abnormalities.  At bone window settings the observed portions of the paranasal sinuses and right mastoid air cells are clear. On the left there is some fluid within the mastoid air cells. There is no evidence of an acute skull fracture.  CT CERVICAL SPINE FINDINGS  The cervical vertebral bodies are preserved in height. There is disc space narrowing at C4-5 and to a lesser extent at C5-6 and C6-7. There are posterior endplate osteophytes at these levels. The prevertebral soft tissue spaces appear normal. There is no evidence of a perched facet or facet or spinous process fracture. The odontoid is intact and the lateral masses of C1 align normally with those of C2. The bony ring at each cervical level is intact. There is facet joint degenerative change at multiple levels.  The soft tissues of the neck exhibit no evidence of hematomas. The pulmonary  apices exhibit fibrotic change but no pneumothorax. The observed portions of the first and second ribs appear intact.  IMPRESSION: 1. There is no evidence of an acute intracranial hemorrhage nor of an acute ischemic infarction. 2. There are age related white matter hypodensities consistent with chronic small vessel ischemic change. 3. There is no evidence of an acute skull fracture. There is chronic opacification within left mastoid air cells which may reflect inflammation. 4. There are degenerative changes of the cervical spine but there is no evidence of an acute fracture nor dislocation.   Electronically Signed   By: David  [place]   On: 02/16/2013 11:40   Ct Cervical Spine Wo Contrast  02/16/2013   CLINICAL DATA:  Status post fall, but able to control movements  EXAM: CT HEAD WITHOUT CONTRAST  CT CERVICAL SPINE WITHOUT CONTRAST  TECHNIQUE: Multidetector CT imaging of the head and cervical spine was performed following the standard protocol without intravenous contrast. Multiplanar CT image reconstructions of the cervical spine were also generated.  COMPARISON:  MRI the brain of November 24, 2011 and CT scan of the brain of November 23, 2011.  FINDINGS: CT HEAD FINDINGS  There is mild diffuse cerebral and cerebellar atrophy with compensatory ventriculomegaly which has progressed slightly since the previous CT scan. There is decreased density in the deep white matter of both cerebral hemispheres consistent with chronic small vessel ischemic type change. There is no acute intracranial hemorrhage nor evidence of an evolving ischemic infarction. The cerebellum and brainstem exhibit no acute abnormalities.  At bone window settings the observed portions of the paranasal sinuses and right mastoid air cells are clear. On the left there is some fluid within the mastoid air cells. There is no evidence of an acute skull fracture.  CT CERVICAL SPINE FINDINGS  The cervical vertebral bodies are preserved in height. There is  disc space narrowing at C4-5 and to a lesser extent at C5-6 and C6-7. There are posterior endplate osteophytes at these levels. The prevertebral soft tissue spaces appear normal. There is no evidence of a perched facet or facet or spinous process fracture. The odontoid is intact and the lateral masses of C1 align normally with those of C2. The bony ring at each cervical level is intact. There is facet joint degenerative change at multiple levels.  The soft tissues of the neck exhibit no evidence of hematomas. The pulmonary apices exhibit fibrotic change but no pneumothorax. The observed portions  of the first and second ribs appear intact.  IMPRESSION: 1. There is no evidence of an acute intracranial hemorrhage nor of an acute ischemic infarction. 2. There are age related white matter hypodensities consistent with chronic small vessel ischemic change. 3. There is no evidence of an acute skull fracture. There is chronic opacification within left mastoid air cells which may reflect inflammation. 4. There are degenerative changes of the cervical spine but there is no evidence of an acute fracture nor dislocation.   Electronically Signed   By: David  [place]   On: 02/16/2013 11:40   Dg Chest Portable 1 View  02/16/2013   CLINICAL DATA:  Fall  EXAM: PORTABLE CHEST - 1 VIEW  COMPARISON:  None.  FINDINGS: Cardiomediastinal silhouette is unremarkable. Hyperinflation. Probable chronic mild interstitial prominence. No acute infiltrate or pulmonary edema.  IMPRESSION: Hyperinflation. No active disease. Probable chronic mild interstitial prominence.   Electronically Signed   By: Natasha Mead M.D.   On: 02/16/2013 13:14   Dg Shoulder Left  02/16/2013   CLINICAL DATA:  Fall.  EXAM: LEFT SHOULDER - 2+ VIEW  COMPARISON:  None.  FINDINGS: Angulated fracture of the proximal left humerus at the diaphyseal metaphyseal junction is normal. Comminution is present. Prominent soft tissue swelling is present . No other focal abnormality  identified  IMPRESSION: Angulated comminuted fracture of proximal left humerus.   Electronically Signed   By: Maisie Fus  Register   On: 02/16/2013 10:20    EKG: Independently reviewed. Sinus rhythm, rate of 87, right axis deviation, LVH  Assessment/Plan  Left proximal humeral fracture, left distal radial fracture secondary to ground-level fall Patient will be admitted to MedSurg with telemetry. It is unknown whether this fall was due to mechanical issue versus a syncopal fall. Patient is unable to provide any information at this time. Will try to obtain orthostatic vitals upon admission. CT of the head was negative for any acute abnormalities. Dr. Melvyn Novas was consulted for radial fracture. Orthopedics was also consulted for humeral fracture however may not intervene, per ER attending. Will make patient n.p.o. at this time as she may have surgery later today. Will continue pain control cautiously. Patient has been placed in sugar tong splint as well as an ace wrap and an arm sling, per orthopedics.  Due to patient's age and hypertension, patient is a moderately risk patient for moderate risk surgery.  Altered mental status This is likely secondary to pain medications, particularly fentanyl that was given to the patient by EMS. Patient was supposedly given 100 mcg of fentanyl. Currently she is aware of her surroundings and only oriented to self, however able to follow commands appropriately. May also be complicated by patient pain. CT of the head was negative. Will obtain ammonia, vitamin B12, folate levels, and TSH.  Will hold patient's Nardil as well as Ativan. Will continue to monitor.  Accelerated hypertension Most likely secondary to patient's pain. Continue patient's home medication of Benicar. Will also add on hydralazine for systolic blood pressures over 045 or diastolic blood pressures over 409.  Hypothyroidism Will continue patient's Synthroid, we'll also obtain a TSH level.  Depression/  anxiety Will continue patient's Nardil. However will hold her Remeron and Ativan at this time due to her altered mental status.   DVT prophylaxis: Heparin  Code Status: Full  Condition: Guarded  Family Communication: None at bedside.   Disposition Plan: Admitted  Time spent: 108 Minutes  Wille Aubuchon D.O. [facility] Pager [phone]  If 7PM-7AM, please contact night-coverage  [email or web address] Password [email or web address] 02/16/2013, 5:01 PM

## 2013-02-16 NOTE — Consult Note (Signed)
Reason for Consult:LEFT WRIST FRACTURE Referring Physician: Lawrence  [name] is an 80 y.o. female.  HPI: NOTES REVIEWED FROM ED AND MEDICINE SERVICE THAT DEMONSTRATE THE HISTORY PT FELL ON LEFT SIDE AND SUSTAINED CLOSED INJURY TO LUE NEIGHBOR HEARD PATIENT CRYING, PT LIKELY SLIPPED FELL IN KITCHEN  Past Medical History  Diagnosis Date  . Unspecified vitamin D deficiency   . Headache(784.0) 02/15/2010  . ALLERGIC RHINITIS CAUSE UNSPECIFIED   . ANXIETY   . DEPRESSION, MAJOR, MODERATE   . HYPERTENSION, BENIGN ESSENTIAL   . HYPOTHYROIDISM   . OSTEOPOROSIS   . Autoimmune hepatitis     on MP6, prev pred    Past Surgical History  Procedure Laterality Date  . Cataract extraction  2008    Left eye  . Cataract extraction  2009    Right eye  . Tonsillectomy  1945    Family History  Problem Relation Age of Onset  . Hypertension Mother   . Stroke Mother   . Heart disease Father   . Stroke Sister     Social History:  reports that she has never smoked. She has never used smokeless tobacco. She reports that she does not drink alcohol or use illicit drugs.  Allergies:  Allergies  Allergen Reactions  . Amlodipine Besylate     REACTION: itching  . Azathioprine     REACTION: nausea  . Penicillins     REACTION: yeast infection  . Sertraline Hcl     REACTION: rash  . Sulfonamide Derivatives     REACTION: swelling and itching    Medications: I have reviewed the patient's current medications.  Results for orders placed during the hospital encounter of 02/16/13 (from the past 48 hour(s))  CBC WITH DIFFERENTIAL     Status: Abnormal   Collection Time    02/16/13 10:28 AM      Result Value Range   WBC 6.3  4.0 - 10.5 K/uL   RBC 3.41 (*) 3.87 - 5.11 MIL/uL   Hemoglobin 11.7 (*) 12.0 - 15.0 g/dL   HCT 32.7 (*) 36.0 - 46.0 %   MCV 95.9  78.0 - 100.0 fL   MCH 34.3 (*) 26.0 - 34.0 pg   MCHC 35.8  30.0 - 36.0 g/dL   RDW 13.1  11.5 - 15.5 %   Platelets 152  150 -  400 K/uL   Neutrophils Relative % 79 (*) 43 - 77 %   Neutro Abs 5.0  1.7 - 7.7 K/uL   Lymphocytes Relative 10 (*) 12 - 46 %   Lymphs Abs 0.6 (*) 0.7 - 4.0 K/uL   Monocytes Relative 11  3 - 12 %   Monocytes Absolute 0.7  0.1 - 1.0 K/uL   Eosinophils Relative 0  0 - 5 %   Eosinophils Absolute 0.0  0.0 - 0.7 K/uL   Basophils Relative 0  0 - 1 %   Basophils Absolute 0.0  0.0 - 0.1 K/uL  BASIC METABOLIC PANEL     Status: Abnormal   Collection Time    02/16/13 10:28 AM      Result Value Range   Sodium 132 (*) 137 - 147 mEq/L   Potassium 3.9  3.7 - 5.3 mEq/L   Chloride 93 (*) 96 - 112 mEq/L   CO2 21  19 - 32 mEq/L   Glucose, Bld 154 (*) 70 - 99 mg/dL   BUN 20  6 - 23 mg/dL   Creatinine, Ser 0.74  0.50 - 1.10 mg/dL  Calcium 9.0  8.4 - 10.5 mg/dL   GFR calc non Af Amer 75 (*) >90 mL/min   GFR calc Af Amer 87 (*) >90 mL/min   Comment: (NOTE)     The eGFR has been calculated using the CKD EPI equation.     This calculation has not been validated in all clinical situations.     eGFR's persistently <90 mL/min signify possible Chronic Kidney     Disease.  URINALYSIS, ROUTINE W REFLEX MICROSCOPIC     Status: Abnormal   Collection Time    02/16/13 11:56 AM      Result Value Range   Color, Urine YELLOW  YELLOW   APPearance CLEAR  CLEAR   Specific Gravity, Urine 1.014  1.005 - 1.030   pH 7.5  5.0 - 8.0   Glucose, UA 100 (*) NEGATIVE mg/dL   Hgb urine dipstick NEGATIVE  NEGATIVE   Bilirubin Urine NEGATIVE  NEGATIVE   Ketones, ur 40 (*) NEGATIVE mg/dL   Protein, ur NEGATIVE  NEGATIVE mg/dL   Urobilinogen, UA 1.0  0.0 - 1.0 mg/dL   Nitrite NEGATIVE  NEGATIVE   Leukocytes, UA NEGATIVE  NEGATIVE   Comment: MICROSCOPIC NOT DONE ON URINES WITH NEGATIVE PROTEIN, BLOOD, LEUKOCYTES, NITRITE, OR GLUCOSE <1000 mg/dL.  CBC     Status: Abnormal   Collection Time    02/16/13  3:34 PM      Result Value Range   WBC 5.6  4.0 - 10.5 K/uL   RBC 3.16 (*) 3.87 - 5.11 MIL/uL   Hemoglobin 10.9 (*) 12.0 -  15.0 g/dL   HCT 30.5 (*) 36.0 - 46.0 %   MCV 96.5  78.0 - 100.0 fL   MCH 34.5 (*) 26.0 - 34.0 pg   MCHC 35.7  30.0 - 36.0 g/dL   RDW 13.0  11.5 - 15.5 %   Platelets 144 (*) 150 - 400 K/uL  CREATININE, SERUM     Status: Abnormal   Collection Time    02/16/13  3:34 PM      Result Value Range   Creatinine, Ser 0.65  0.50 - 1.10 mg/dL   GFR calc non Af Amer 78 (*) >90 mL/min   GFR calc Af Amer >90  >90 mL/min   Comment: (NOTE)     The eGFR has been calculated using the CKD EPI equation.     This calculation has not been validated in all clinical situations.     eGFR's persistently <90 mL/min signify possible Chronic Kidney     Disease.    Dg Wrist Complete Left  02/16/2013   CLINICAL DATA:  Fall, bruising and swelling left wrist  EXAM: LEFT WRIST - COMPLETE 3+ VIEW  COMPARISON:  None  FINDINGS: Osseous demineralization.  Comminuted displaced distal left radial metaphyseal fracture with apex volar angulation.  Intra-articular extension at the radiocarpal joint and question the distal radioulnar joint.  Secondary ulnar plus variance.  Degenerative changes at distal pole scaphoid.  Associated soft tissue swelling at wrist.  Questionable tiny avulsion fracture at tip of ulnar styloid process.  No additional fracture, dislocation or bone destruction.  Scattered vascular calcifications.  IMPRESSION: Comminuted displaced and angulated distal left radial metaphyseal fracture with intra-articular extension at the radiocarpal and question distal radioulnar joints.  Suspect tiny avulsion fracture tip of ulnar styloid.   Electronically Signed   By: Lavonia Dana M.D.   On: 02/16/2013 10:20   Ct Head Wo Contrast  02/16/2013   CLINICAL DATA:  Status post  fall, but able to control movements  EXAM: CT HEAD WITHOUT CONTRAST  CT CERVICAL SPINE WITHOUT CONTRAST  TECHNIQUE: Multidetector CT imaging of the head and cervical spine was performed following the standard protocol without intravenous contrast. Multiplanar  CT image reconstructions of the cervical spine were also generated.  COMPARISON:  MRI the brain of November 24, 2011 and CT scan of the brain of November 23, 2011.  FINDINGS: CT HEAD FINDINGS  There is mild diffuse cerebral and cerebellar atrophy with compensatory ventriculomegaly which has progressed slightly since the previous CT scan. There is decreased density in the deep white matter of both cerebral hemispheres consistent with chronic small vessel ischemic type change. There is no acute intracranial hemorrhage nor evidence of an evolving ischemic infarction. The cerebellum and brainstem exhibit no acute abnormalities.  At bone window settings the observed portions of the paranasal sinuses and right mastoid air cells are clear. On the left there is some fluid within the mastoid air cells. There is no evidence of an acute skull fracture.  CT CERVICAL SPINE FINDINGS  The cervical vertebral bodies are preserved in height. There is disc space narrowing at C4-5 and to a lesser extent at C5-6 and C6-7. There are posterior endplate osteophytes at these levels. The prevertebral soft tissue spaces appear normal. There is no evidence of a perched facet or facet or spinous process fracture. The odontoid is intact and the lateral masses of C1 align normally with those of C2. The bony ring at each cervical level is intact. There is facet joint degenerative change at multiple levels.  The soft tissues of the neck exhibit no evidence of hematomas. The pulmonary apices exhibit fibrotic change but no pneumothorax. The observed portions of the first and second ribs appear intact.  IMPRESSION: 1. There is no evidence of an acute intracranial hemorrhage nor of an acute ischemic infarction. 2. There are age related white matter hypodensities consistent with chronic small vessel ischemic change. 3. There is no evidence of an acute skull fracture. There is chronic opacification within left mastoid air cells which may reflect  inflammation. 4. There are degenerative changes of the cervical spine but there is no evidence of an acute fracture nor dislocation.   Electronically Signed   By: David  Martinique   On: 02/16/2013 11:40   Ct Cervical Spine Wo Contrast  02/16/2013   CLINICAL DATA:  Status post fall, but able to control movements  EXAM: CT HEAD WITHOUT CONTRAST  CT CERVICAL SPINE WITHOUT CONTRAST  TECHNIQUE: Multidetector CT imaging of the head and cervical spine was performed following the standard protocol without intravenous contrast. Multiplanar CT image reconstructions of the cervical spine were also generated.  COMPARISON:  MRI the brain of November 24, 2011 and CT scan of the brain of November 23, 2011.  FINDINGS: CT HEAD FINDINGS  There is mild diffuse cerebral and cerebellar atrophy with compensatory ventriculomegaly which has progressed slightly since the previous CT scan. There is decreased density in the deep white matter of both cerebral hemispheres consistent with chronic small vessel ischemic type change. There is no acute intracranial hemorrhage nor evidence of an evolving ischemic infarction. The cerebellum and brainstem exhibit no acute abnormalities.  At bone window settings the observed portions of the paranasal sinuses and right mastoid air cells are clear. On the left there is some fluid within the mastoid air cells. There is no evidence of an acute skull fracture.  CT CERVICAL SPINE FINDINGS  The cervical vertebral bodies are preserved  in height. There is disc space narrowing at C4-5 and to a lesser extent at C5-6 and C6-7. There are posterior endplate osteophytes at these levels. The prevertebral soft tissue spaces appear normal. There is no evidence of a perched facet or facet or spinous process fracture. The odontoid is intact and the lateral masses of C1 align normally with those of C2. The bony ring at each cervical level is intact. There is facet joint degenerative change at multiple levels.  The soft  tissues of the neck exhibit no evidence of hematomas. The pulmonary apices exhibit fibrotic change but no pneumothorax. The observed portions of the first and second ribs appear intact.  IMPRESSION: 1. There is no evidence of an acute intracranial hemorrhage nor of an acute ischemic infarction. 2. There are age related white matter hypodensities consistent with chronic small vessel ischemic change. 3. There is no evidence of an acute skull fracture. There is chronic opacification within left mastoid air cells which may reflect inflammation. 4. There are degenerative changes of the cervical spine but there is no evidence of an acute fracture nor dislocation.   Electronically Signed   By: David  Martinique   On: 02/16/2013 11:40   Dg Chest Portable 1 View  02/16/2013   CLINICAL DATA:  Fall  EXAM: PORTABLE CHEST - 1 VIEW  COMPARISON:  None.  FINDINGS: Cardiomediastinal silhouette is unremarkable. Hyperinflation. Probable chronic mild interstitial prominence. No acute infiltrate or pulmonary edema.  IMPRESSION: Hyperinflation. No active disease. Probable chronic mild interstitial prominence.   Electronically Signed   By: Lahoma Crocker M.D.   On: 02/16/2013 13:14   Dg Shoulder Left  02/16/2013   CLINICAL DATA:  Fall.  EXAM: LEFT SHOULDER - 2+ VIEW  COMPARISON:  None.  FINDINGS: Angulated fracture of the proximal left humerus at the diaphyseal metaphyseal junction is normal. Comminution is present. Prominent soft tissue swelling is present . No other focal abnormality identified  IMPRESSION: Angulated comminuted fracture of proximal left humerus.   Electronically Signed   By: [place]   On: 02/16/2013 10:20    ROS NO RECENT ILLNESSES OR HOSPITALIZATIONS Blood pressure 141/74, pulse 78, temperature 97.8 F (36.6 C), temperature source Oral, resp. rate 20, height _0  (1.676 m), weight 47.2 kg (104 lb 0.9 oz), SpO2 100.00%. Physical Exam LUE: LARGE AMOUNT OF SWELLING IN PROXIMAL HUMERUS REGION SKIN  INTACT FINGERS WARM WELL PERFUSED  Assessment/Plan: LEFT COMMINUTED INTRAARTICULAR DISTAL RADIUS FRACTURE  LEFT PROXIMAL HUMERUS FRACTURE  LEFT WRIST OPEN REDUCTION AND INTERNAL FIXATION CLOSED MANIPULATION LEFT SHOULDER  R/B/A DISCUSSED WITH DAUGTHER VIA TELEPHONE.  FAMILY VOICED UNDERSTANDING OF PLAN CONSENT SIGNED DAY OF SURGERY PT SEEN AND EXAMINED PRIOR TO OPERATIVE PROCEDURE/DAY OF SURGERY SITE MARKED. QUESTIONS ANSWERED [place] FOLLOWING SURGERY Marisa Smith 02/16/2013, 7:05 PM

## 2013-02-16 NOTE — ED Notes (Signed)
Pt returned from xray; CT backed up at this time.

## 2013-02-16 NOTE — ED Notes (Signed)
Keys to house are in pt's purse at bedside.

## 2013-02-16 NOTE — ED Notes (Signed)
Ortho re-paged.

## 2013-02-16 NOTE — Anesthesia Preprocedure Evaluation (Addendum)
Anesthesia Evaluation  Patient identified by MRN, date of birth, ID band Patient confused    Reviewed: Allergy & Precautions, H&P , NPO status , Patient's Chart, lab work & pertinent test results  History of Anesthesia Complications Negative for: history of anesthetic complications  Airway Mallampati: II TM Distance: >3 FB Neck ROM: Full    Dental  (+) Teeth Intact   Pulmonary neg pulmonary ROS,    Pulmonary exam normal       Cardiovascular hypertension,     Neuro/Psych PSYCHIATRIC DISORDERS Anxiety Depression Dementia    GI/Hepatic negative GI ROS, Neg liver ROS, (+) Hepatitis -, Autoimmune  Endo/Other  Hypothyroidism   Renal/GU negative Renal ROS     Musculoskeletal   Abdominal   Peds  Hematology   Anesthesia Other Findings Dementia  Reproductive/Obstetrics                          Anesthesia Physical Anesthesia Plan  ASA: III  Anesthesia Plan: General   Post-op Pain Management:    Induction: Intravenous  Airway Management Planned: LMA and Oral ETT  Additional Equipment:   Intra-op Plan:   Post-operative Plan: Extubation in OR  Informed Consent: I have reviewed the patients History and Physical, chart, labs and discussed the procedure including the risks, benefits and alternatives for the proposed anesthesia with the patient or authorized representative who has indicated his/her understanding and acceptance.   Dental advisory given  Plan Discussed with: CRNA, Anesthesiologist and Surgeon  Anesthesia Plan Comments:        Anesthesia Quick Evaluation

## 2013-02-16 NOTE — ED Notes (Signed)
Portable at bedside 

## 2013-02-16 NOTE — ED Notes (Signed)
Faythe DingwallCarol Fifilied; daughter [phone]. Please call when admitted.

## 2013-02-16 NOTE — ED Notes (Signed)
Patient transported to X-ray and CT; daughter at bedside to accompany. Pt is tearful; family and pt reassured by RN.

## 2013-02-17 DIAGNOSIS — E871 Hypo-osmolality and hyponatremia: Secondary | ICD-10-CM

## 2013-02-17 DIAGNOSIS — R404 Transient alteration of awareness: Secondary | ICD-10-CM

## 2013-02-17 DIAGNOSIS — E43 Unspecified severe protein-calorie malnutrition: Secondary | ICD-10-CM

## 2013-02-17 DIAGNOSIS — S52599A Other fractures of lower end of unspecified radius, initial encounter for closed fracture: Principal | ICD-10-CM

## 2013-02-17 LAB — BASIC METABOLIC PANEL
BUN: 19 mg/dL (ref 6–23)
CALCIUM: 8 mg/dL — AB (ref 8.4–10.5)
CO2: 23 mEq/L (ref 19–32)
Chloride: 94 mEq/L — ABNORMAL LOW (ref 96–112)
Creatinine, Ser: 0.73 mg/dL (ref 0.50–1.10)
GFR calc Af Amer: 87 mL/min — ABNORMAL LOW (ref >90)
GFR calc non Af Amer: 75 mL/min — ABNORMAL LOW (ref >90)
GLUCOSE: 103 mg/dL — AB (ref 70–99)
Potassium: 3.8 mEq/L (ref 3.7–5.3)
Sodium: 131 mEq/L — ABNORMAL LOW (ref 137–147)

## 2013-02-17 LAB — CBC
HCT: 26.9 % — ABNORMAL LOW (ref 36.0–46.0)
Hemoglobin: 9.3 g/dL — ABNORMAL LOW (ref 12.0–15.0)
MCH: 34.1 pg — AB (ref 26.0–34.0)
MCHC: 34.6 g/dL (ref 30.0–36.0)
MCV: 98.5 fL (ref 78.0–100.0)
PLATELETS: 126 10*3/uL — AB (ref 150–400)
RBC: 2.73 MIL/uL — AB (ref 3.87–5.11)
RDW: 13.4 % (ref 11.5–15.5)
WBC: 5 10*3/uL (ref 4.0–10.5)

## 2013-02-17 LAB — FOLATE RBC: RBC Folate: 470 ng/mL (ref >280)

## 2013-02-17 MED ORDER — ENSURE COMPLETE PO LIQD
237.0000 mL | Freq: Two times a day (BID) | ORAL | Status: DC
  Administered 2013-02-17 – 2013-02-18 (×3): 237 mL via ORAL

## 2013-02-17 MED ORDER — LORAZEPAM 0.5 MG PO TABS: 0.5000 mg | ORAL_TABLET | Freq: Two times a day (BID) | ORAL | Status: DC | PRN

## 2013-02-17 MED ORDER — VITAMIN D3 25 MCG (1000 UNIT) PO TABS
2000.0000 [IU] | ORAL_TABLET | Freq: Every day | ORAL | Status: DC
  Administered 2013-02-17 – 2013-02-18 (×2): 2000 [IU] via ORAL
  Filled 2013-02-17 (×2): qty 2

## 2013-02-17 MED ORDER — MIRTAZAPINE 45 MG PO TABS
45.0000 mg | ORAL_TABLET | Freq: Every evening | ORAL | Status: DC | PRN
  Filled 2013-02-17: qty 1

## 2013-02-17 NOTE — Progress Notes (Signed)
NOTES REVIEWED PT SEEN THIS AM SPLINT IN PLACE FINGERS WARM WELL PERFUSED ABLE TO WIGGLE FINGERS  IMP: LEFT DISTAL RADIUS AND PROXIMAL HUMERUS FRACTURE  CONTINUE SLING, NWB LUE ICE PAIN CONTROL WILL CONTINUE TO FOLLOW

## 2013-02-17 NOTE — Clinical Documentation Improvement (Signed)
Possible Clinical Conditions?  Severe Malnutrition   Protein Calorie Malnutrition Severe Protein Calorie Malnutrition Emaciation  Cachexia    Other Condition Cannot clinically determine  Supporting Information: Risk Factors: Signs & Symptoms: Diagnostics: Treatment:  Thank You, Dollene PrimroseJoan B Dmiyah Liscano,RN, BSN, CCDS Clinical Documentation Specialist:  [phone]   [phone] [place]- Health Information Management

## 2013-02-17 NOTE — Progress Notes (Addendum)
Clinical Social Work Department CLINICAL SOCIAL WORK PLACEMENT NOTE 02/17/2013  Patient:  [name],[name]  Account Number:  [id number] Admit date:  02/16/2013  Clinical Social Worker:  Sharol HarnessPOONUM Efrain Clauson, Theresia MajorsLCSWA  Date/time:  02/17/2013 03:30 PM  Clinical Social Work is seeking post-discharge placement for this patient at the following level of care:   SKILLED NURSING   (*CSW will update this form in Epic as items are completed)   02/17/2013  [facility]family provided with [facility] Clinical [facility] Work's list of facilities offering this level of care within the geographic area requested by the patient (or if unable, by the patient's family).  02/17/2013  Patient/family informed of their freedom to choose among providers that offer the needed level of care, that participate in Medicare, Medicaid or managed care program needed by the patient, have an available bed and are willing to accept the patient.  02/17/2013  Patient/family informed of [facility]' ownership interest in [facility], as well as of the fact that they are under no obligation to receive care at this facility.  PASARR submitted to EDS on 02/18/2013 PASARR number received from EDS on 02/18/2013  FL2 transmitted to all facilities in geographic area requested by pt/family on  02/17/2013 FL2 transmitted to all facilities within larger geographic area on   Patient informed that his/her managed care company has contracts with or will negotiate with  certain facilities, including the following:     Patient/family informed of bed offers received:  02/18/2013 Patient chooses bed at [facility] Physician recommends and patient chooses bed at    [facility]  on  02/18/2013 [facility]  The following physician request were entered in Epic:   Additional Comments:  [name], LCSWA [phone]

## 2013-02-17 NOTE — ED Provider Notes (Signed)
Medical screening examination/treatment/procedure(s) were conducted as a shared visit with non-physician practitioner(s) and myself.  I personally evaluated the patient during the encounter.  EKG Interpretation    Date/Time:  Wednesday February 16 2013 09:45:30 EST Ventricular Rate:  87 PR Interval:  195 QRS Duration: 88 QT Interval:  374 QTC Calculation: 450 R Axis:   87 Text Interpretation:  Sinus rhythm Bo rderline right axis deviation Borderline repolarization abnormality Confirmed by Dahmir Epperly  MD, Toni AmendOURTNEY ([id number]) on 02/16/2013 11:12:06 AM            Patient was primarily seen by me. Laceration was repaired PA. Good cosmetic results.  Shon Batonourtney F Ryliee Figge, MD 02/17/13 [phone]

## 2013-02-17 NOTE — Progress Notes (Signed)
INITIAL NUTRITION ASSESSMENT  DOCUMENTATION CODES Per approved criteria  -Severe malnutrition in the context of chronic illness -Underweight   INTERVENTION:  1. Ensure Complete po BID, each supplement provides 350 kcal and 13 grams of protein  2. Nutrition Ambassador to assist with meal choices.   NUTRITION DIAGNOSIS: Malnutrition related to chronic illness as evidenced by severe fat and muscle wasting.   Goal: Pt to meet >/= 90% of their estimated nutrition needs   Monitor:  PO intake, supplement acceptance, weight trend  Reason for Assessment: Poor PO, MAOI  80 y.o. female  Admitting Dx: <principal problem not specified>  ASSESSMENT: Pt admitted after being found in her home by a neighbor after a fall. Pt with left proximal humeral fx and left distal radial fx. Pt is s/p ORIF of distal radial fx and closed manipulation of left shoulder.  Pt able to provide some hx but is confused. Pt has Breakfast in front of her. Pt states that she cannot eat what was sent to her because she has HTN and cannot eat eggs, bacon, and biscuits. Pt had ate her grits. Provided pt with a bowl of Cheerios. Pt is willing to drink ensure but does not usually drink ensure at home. Pt has multiple food preferences.  Pt lives alone currently, per pt her husband is at rehab. Pt reports weight loss. Per record review she has lost 4% since her last weight on 8/14. Suspect pt not able to care for self at home and not eating well.  Pt also on MAOI which is a home medication. No diet changes needed as hospitalized diet is low in tyramine.   Nutrition Focused Physical Exam:  Subcutaneous Fat:  Orbital Region: WNL Upper Arm Region: severe wasting Thoracic and Lumbar Region: severe wasting  Muscle:  Temple Region: severe wasting Clavicle Bone Region: severe wasting Clavicle and Acromion Bone Region: severe wasting Scapular Bone Region: severe wasting Dorsal Hand: severe wasting Patellar Region: severe  wasting Anterior Thigh Region: severe wasting Posterior Calf Region: severe wasting  Edema: not persent  Height: Ht Readings from Last 1 Encounters:  02/16/13 5\' 6"  (1.676 m)    Weight: Wt Readings from Last 1 Encounters:  02/16/13 104 lb 0.9 oz (47.2 kg)    Ideal Body Weight: 59 kg   % Ideal Body Weight: 80%  Wt Readings from Last 10 Encounters:  02/16/13 104 lb 0.9 oz (47.2 kg)  02/16/13 104 lb 0.9 oz (47.2 kg)  01/18/13 104 lb (47.174 kg)  01/04/13 104 lb 12.8 oz (47.537 kg)  10/01/12 108 lb (48.988 kg)  08/04/12 110 lb 12.8 oz (50.259 kg)  04/02/12 106 lb 3.2 oz (48.172 kg)  02/06/12 106 lb 6.4 oz (48.263 kg)  12/30/11 107 lb (48.535 kg)  12/18/11 106 lb (48.081 kg)    Usual Body Weight: 108 lb   % Usual Body Weight: 96%  BMI:  Body mass index is 16.8 kg/(m^2).  Estimated Nutritional Needs: Kcal: 1400-1500 Protein: 65-75 grams Fluid: >1.5 L/day  Skin: incision left arm; left head laceration  Diet Order: General  EDUCATION NEEDS: -No education needs identified at this time   Intake/Output Summary (Last 24 hours) at 02/17/13 0902 Last data filed at 02/17/13 0700  Gross per 24 hour  Intake 1673.75 ml  Output   1525 ml  Net 148.75 ml    Last BM: PTA   Labs:   Recent Labs Lab 02/16/13 1028 02/16/13 1534 02/17/13 0250  NA 132*  --  131*  K 3.9  --  3.8  CL 93*  --  94*  CO2 21  --  23  BUN 20  --  19  CREATININE 0.74 0.65 0.73  CALCIUM 9.0  --  8.0*  GLUCOSE 154*  --  103*    CBG (last 3)  No results found for this basename: GLUCAP,  in the last 72 hours  Scheduled Meds: . aspirin EC  81 mg Oral Daily  .  ceFAZolin (ANCEF) IV  1 g Intravenous Q8H  . clindamycin  600 mg Intravenous To OR  . heparin  5,000 Units Subcutaneous Q8H  . irbesartan  300 mg Oral Daily  . levothyroxine  50 mcg Oral QAC breakfast  . mercaptopurine  25 mg Oral Q48H  . morphine      . phenelzine  7.5 mg Oral TID  . sodium chloride  3 mL Intravenous Q12H     Continuous Infusions: . lactated ringers 20 mL/hr at 02/16/13 1610    Past Medical History  Diagnosis Date  . Unspecified vitamin D deficiency   . Headache(784.0) 02/15/2010  . ALLERGIC RHINITIS CAUSE UNSPECIFIED   . ANXIETY   . DEPRESSION, MAJOR, MODERATE   . HYPERTENSION, BENIGN ESSENTIAL   . HYPOTHYROIDISM   . OSTEOPOROSIS   . Autoimmune hepatitis     on MP6, prev pred    Past Surgical History  Procedure Laterality Date  . Cataract extraction  2008    Left eye  . Cataract extraction  2009    Right eye  . Tonsillectomy  [address]    Kendell Bane RD, LDN, CNSC [phone] Pager [phone] After Hours Pager

## 2013-02-17 NOTE — Progress Notes (Signed)
Utilization review completed.  

## 2013-02-17 NOTE — Progress Notes (Addendum)
Clinical Social Work Department BRIEF PSYCHOSOCIAL ASSESSMENT 02/17/2013  Patient:  Marisa Smith,Marisa Smith     Account Number:  [id number]     Admit date:  02/16/2013  Clinical Social Worker:  Marisa Smith,Marisa Smith, LCSWA  Date/Time:  02/17/2013 03:00 PM  Referred by:  Physician  Date Referred:  02/17/2013 Referred for  SNF Placement   Other Referral:   Interview type:  Family Other interview type:   Spoke with pt but pt not oriented. Called and spoke with pt daughter    PSYCHOSOCIAL DATA Living Status:  ALONE Admitted from facility:   Level of care:   Primary support name:  Marisa Smith [phone] Primary support relationship to patient:  CHILD, ADULT Degree of support available:   Pt has supportive family    CURRENT CONCERNS Current Concerns  Post-Acute Placement   Other Concerns:    SOCIAL WORK ASSESSMENT / PLAN CSW visited pt room to speak about ST rehab. However, pt family not present and pt not oriented. CSW called pt daughter and explained recommendation for SNF. Pt daughter aware and agreeable. CSW explained SNF referral process and pt daughter okay [facility] Pt daughter had no concerns at this time about pt going to SNF.   CSW called pt daughter back and left message informing that pt SS# not on file. CSW will need SS# for facilities and PASRR. CSW asked pt nurse to please request SS# from family if they come up to pt room.   Assessment/plan status:  Psychosocial Support/Ongoing Assessment of Needs Other assessment/ plan:   Information/referral to community resources:   SNF list left at bedside    PATIENT'S/FAMILY'S RESPONSE TO PLAN OF CARE: [facility]       Cole CampPoonum Elise Knobloch, LCSWA [phone]

## 2013-02-17 NOTE — Progress Notes (Addendum)
[facility]                                                                              Patient Demographics  Marisa Smith, is a 80 y.o. female, DOB - [birth year], BJY:[id number]  Admit date - 02/16/2013   Admitting Physician Edsel PetrinMaryann Jeffery Bachmeier, DO  Outpatient Primary MD for the patient is Marisa PaciValerie Leschber, MD  LOS - 1   Chief Complaint  Patient presents with  . Fall        Assessment & Plan    Active Problems:   HYPOTHYROIDISM   Unspecified vitamin D deficiency   DEPRESSION, MAJOR, MODERATE   ANXIETY   HYPERTENSION, BENIGN ESSENTIAL   Radius fracture   Fracture, humerus closed   Protein-calorie malnutrition, severe  Left proximal humeral fracture, left distal radial fracture secondary to ground-level fall  -Postop day 1, status post ORIF for left distal radial fracture and closed manipulation of left shoulder -Orthopedics, Dr. Melvyn Novasrtmann and following -Continue pain control -Will consult case management/social work for nursing home/rehabilitation placement -Will consult PT and OT for eval and treatment  Altered mental status  -Likely secondary to pain medications as well as patient's pain -Has improved and resolved -Ammonia level found to be 31, TSH 2.036, vitamin B12 353, folate pending  Anemia -Likely secondary to acute blood loss from surgery as well as torsional component -Will continue to monitor H&H, transfuse below 7.  Chronic hyponatremia -Will continue to monitor -baseline sodium 130, currently 131  Accelerated hypertension  -Likely secondary to pain -Currently controlled -Will continue to be irbesartan and hydralazine when necessary  Hypothyroidism  -Will continue Synthroid -TSH 2.039  Depression/ anxiety  -Will continue Nardil -Will restart her Ativan and Remeron, which were initially held due to altered mental status  Severe protein calorie malnutrition -Nutritionist consulted -Will continue supplementation with Ensure   Code Status:  Full  Family Communication: None at this time, we'll try to contact daughter  Disposition Plan: Admitted  Time Spent in minutes   25 minutes  Procedures  Left ORIF distal radial fracture Closed manipulation of left shoulder  Consults   Orthopedic  DVT Prophylaxis  heparin  Lab Results  Component Value Date   PLT 126* 02/17/2013    Medications  Scheduled Meds: . aspirin EC  81 mg Oral Daily  .  ceFAZolin (ANCEF) IV  1 g Intravenous Q8H  . clindamycin  600 mg Intravenous To OR  . feeding supplement (ENSURE COMPLETE)  237 mL Oral BID BM  . heparin  5,000 Units Subcutaneous Q8H  . irbesartan  300 mg Oral Daily  . levothyroxine  50 mcg Oral QAC breakfast  . mercaptopurine  25 mg Oral Q48H  . phenelzine  7.5 mg Oral TID  . sodium chloride  3 mL Intravenous Q12H   Continuous Infusions: . lactated ringers 20 mL/hr at 02/16/13 1802   PRN Meds:.acetaminophen, acetaminophen, hydrALAZINE, HYDROcodone-acetaminophen, morphine injection, ondansetron (ZOFRAN) IV, ondansetron  Antibiotics    Anti-infectives   Start     Dose/Rate Route Frequency Ordered Stop   02/17/13 0400  ceFAZolin (ANCEF) IVPB 1 g/50 mL premix     1 g 100 mL/hr over 30 Minutes Intravenous Every 8  hours 02/16/13 2251 02/18/13 0359   02/16/13 2015  clindamycin (CLEOCIN) IVPB 600 mg     600 mg 100 mL/hr over 30 Minutes Intravenous To Surgery 02/16/13 2002 02/17/13 2015   02/16/13 1940  ceFAZolin (ANCEF) 2-3 GM-% IVPB SOLR    Comments:  [name]   : cabinet override      02/16/13 1940 02/16/13 2011        Subjective:   Marisa Smith seen and examined today.  Patient alert today. Complains of pain in her left arm. Patient denies any chest pain, shortness of breath, abdominal pain at this time.   Objective:   Filed Vitals:   02/16/13 2239 02/16/13 2300 02/17/13 0122 02/17/13 0534  BP: 131/64 103/55 128/62 126/67  Pulse: 85 81 86 83  Temp: 98.4 F (36.9 C) 98.2 F (36.8 C) 98.6 F (37 C) 98.2 F  (36.8 C)  TempSrc: Oral  Oral Oral  Resp: 18 20 19 18   Height:      Weight:      SpO2: 100% 100% 98% 99%    Wt Readings from Last 3 Encounters:  02/16/13 47.2 kg (104 lb 0.9 oz)  02/16/13 47.2 kg (104 lb 0.9 oz)  01/18/13 47.174 kg (104 lb)     Intake/Output Summary (Last 24 hours) at 02/17/13 1142 Last data filed at 02/17/13 0700  Gross per 24 hour  Intake 1673.75 ml  Output   1525 ml  Net 148.75 ml    Exam  General: Well developed, well nourished, NAD, appears stated age  HEENT: NCAT, PERRLA, EOMI, Anicteic Sclera, mucous membranes moist. No pharyngeal erythema or exudates  Neck: Supple, no JVD, no masses  Cardiovascular: S1 S2 auscultated, no rubs, murmurs or gallops. Regular rate and rhythm.  Respiratory: Clear to auscultation bilaterally with equal chest rise  Abdomen: Soft, nontender, nondistended, + bowel sounds  Extremities: warm dry without cyanosis clubbing or edema.  Some swelling noted in left shoulder with erythema. Left Arm currently in sling.  Neuro: AAOx3, cranial nerves grossly intact. Strength equal and bilaterally in lower ext, and 5/5 in RUE, not tested in LUE due to pain.    Skin: Without rashes exudates or nodules  Psych: Normal affect and demeanor with intact judgement and insight  Data Review   Micro Results Recent Results (from the past 240 hour(s))  MRSA PCR SCREENING     Status: None   Collection Time    02/16/13  5:24 PM      Result Value Range Status   MRSA by PCR NEGATIVE  NEGATIVE Final   Comment:            The GeneXpert MRSA Assay (FDA     approved for NASAL specimens     only), is one component of a     comprehensive MRSA colonization     surveillance program. It is not     intended to diagnose MRSA     infection nor to guide or     monitor treatment for     MRSA infections.    Radiology Reports Dg Wrist Complete Left  02/16/2013   CLINICAL DATA:  Fall, bruising and swelling left wrist  EXAM: LEFT WRIST - COMPLETE  3+ VIEW  COMPARISON:  None  FINDINGS: Osseous demineralization.  Comminuted displaced distal left radial metaphyseal fracture with apex volar angulation.  Intra-articular extension at the radiocarpal joint and question the distal radioulnar joint.  Secondary ulnar plus variance.  Degenerative changes at distal pole scaphoid.  Associated soft tissue swelling at wrist.  Questionable tiny avulsion fracture at tip of ulnar styloid process.  No additional fracture, dislocation or bone destruction.  Scattered vascular calcifications.  IMPRESSION: Comminuted displaced and angulated distal left radial metaphyseal fracture with intra-articular extension at the radiocarpal and question distal radioulnar joints.  Suspect tiny avulsion fracture tip of ulnar styloid.   Electronically Signed   By: Ulyses Southward M.D.   On: 02/16/2013 10:20   Ct Head Wo Contrast  02/16/2013   CLINICAL DATA:  Status post fall, but able to control movements  EXAM: CT HEAD WITHOUT CONTRAST  CT CERVICAL SPINE WITHOUT CONTRAST  TECHNIQUE: Multidetector CT imaging of the head and cervical spine was performed following the standard protocol without intravenous contrast. Multiplanar CT image reconstructions of the cervical spine were also generated.  COMPARISON:  MRI the brain of November 24, 2011 and CT scan of the brain of November 23, 2011.  FINDINGS: CT HEAD FINDINGS  There is mild diffuse cerebral and cerebellar atrophy with compensatory ventriculomegaly which has progressed slightly since the previous CT scan. There is decreased density in the deep white matter of both cerebral hemispheres consistent with chronic small vessel ischemic type change. There is no acute intracranial hemorrhage nor evidence of an evolving ischemic infarction. The cerebellum and brainstem exhibit no acute abnormalities.  At bone window settings the observed portions of the paranasal sinuses and right mastoid air cells are clear. On the left there is some fluid within the  mastoid air cells. There is no evidence of an acute skull fracture.  CT CERVICAL SPINE FINDINGS  The cervical vertebral bodies are preserved in height. There is disc space narrowing at C4-5 and to a lesser extent at C5-6 and C6-7. There are posterior endplate osteophytes at these levels. The prevertebral soft tissue spaces appear normal. There is no evidence of a perched facet or facet or spinous process fracture. The odontoid is intact and the lateral masses of C1 align normally with those of C2. The bony ring at each cervical level is intact. There is facet joint degenerative change at multiple levels.  The soft tissues of the neck exhibit no evidence of hematomas. The pulmonary apices exhibit fibrotic change but no pneumothorax. The observed portions of the first and second ribs appear intact.  IMPRESSION: 1. There is no evidence of an acute intracranial hemorrhage nor of an acute ischemic infarction. 2. There are age related white matter hypodensities consistent with chronic small vessel ischemic change. 3. There is no evidence of an acute skull fracture. There is chronic opacification within left mastoid air cells which may reflect inflammation. 4. There are degenerative changes of the cervical spine but there is no evidence of an acute fracture nor dislocation.   Electronically Signed   By: David  [place]   On: 02/16/2013 11:40   Ct Cervical Spine Wo Contrast  02/16/2013   CLINICAL DATA:  Status post fall, but able to control movements  EXAM: CT HEAD WITHOUT CONTRAST  CT CERVICAL SPINE WITHOUT CONTRAST  TECHNIQUE: Multidetector CT imaging of the head and cervical spine was performed following the standard protocol without intravenous contrast. Multiplanar CT image reconstructions of the cervical spine were also generated.  COMPARISON:  MRI the brain of November 24, 2011 and CT scan of the brain of November 23, 2011.  FINDINGS: CT HEAD FINDINGS  There is mild diffuse cerebral and cerebellar atrophy with  compensatory ventriculomegaly which has progressed slightly since the previous CT scan. There is decreased  density in the deep white matter of both cerebral hemispheres consistent with chronic small vessel ischemic type change. There is no acute intracranial hemorrhage nor evidence of an evolving ischemic infarction. The cerebellum and brainstem exhibit no acute abnormalities.  At bone window settings the observed portions of the paranasal sinuses and right mastoid air cells are clear. On the left there is some fluid within the mastoid air cells. There is no evidence of an acute skull fracture.  CT CERVICAL SPINE FINDINGS  The cervical vertebral bodies are preserved in height. There is disc space narrowing at C4-5 and to a lesser extent at C5-6 and C6-7. There are posterior endplate osteophytes at these levels. The prevertebral soft tissue spaces appear normal. There is no evidence of a perched facet or facet or spinous process fracture. The odontoid is intact and the lateral masses of C1 align normally with those of C2. The bony ring at each cervical level is intact. There is facet joint degenerative change at multiple levels.  The soft tissues of the neck exhibit no evidence of hematomas. The pulmonary apices exhibit fibrotic change but no pneumothorax. The observed portions of the first and second ribs appear intact.  IMPRESSION: 1. There is no evidence of an acute intracranial hemorrhage nor of an acute ischemic infarction. 2. There are age related white matter hypodensities consistent with chronic small vessel ischemic change. 3. There is no evidence of an acute skull fracture. There is chronic opacification within left mastoid air cells which may reflect inflammation. 4. There are degenerative changes of the cervical spine but there is no evidence of an acute fracture nor dislocation.   Electronically Signed   By: David  [place]   On: 02/16/2013 11:40   Dg Chest Portable 1 View  02/16/2013   CLINICAL DATA:   Fall  EXAM: PORTABLE CHEST - 1 VIEW  COMPARISON:  None.  FINDINGS: Cardiomediastinal silhouette is unremarkable. Hyperinflation. Probable chronic mild interstitial prominence. No acute infiltrate or pulmonary edema.  IMPRESSION: Hyperinflation. No active disease. Probable chronic mild interstitial prominence.   Electronically Signed   By: Natasha Mead M.D.   On: 02/16/2013 13:14   Dg Shoulder Left  02/16/2013   CLINICAL DATA:  Fall.  EXAM: LEFT SHOULDER - 2+ VIEW  COMPARISON:  None.  FINDINGS: Angulated fracture of the proximal left humerus at the diaphyseal metaphyseal junction is normal. Comminution is present. Prominent soft tissue swelling is present . No other focal abnormality identified  IMPRESSION: Angulated comminuted fracture of proximal left humerus.   Electronically Signed   By: Maisie Fus  Register   On: 02/16/2013 10:20    CBC  Recent Labs Lab 02/16/13 1028 02/16/13 1534 02/17/13 0250  WBC 6.3 5.6 5.0  HGB 11.7* 10.9* 9.3*  HCT 32.7* 30.5* 26.9*  PLT 152 144* 126*  MCV 95.9 96.5 98.5  MCH 34.3* 34.5* 34.1*  MCHC 35.8 35.7 34.6  RDW 13.1 13.0 13.4  LYMPHSABS 0.6*  --   --   MONOABS 0.7  --   --   EOSABS 0.0  --   --   BASOSABS 0.0  --   --     Chemistries   Recent Labs Lab 02/16/13 1028 02/16/13 1534 02/17/13 0250  NA 132*  --  131*  K 3.9  --  3.8  CL 93*  --  94*  CO2 21  --  23  GLUCOSE 154*  --  103*  BUN 20  --  19  CREATININE 0.74 0.65 0.73  CALCIUM  9.0  --  8.0*   ------------------------------------------------------------------------------------------------------------------ estimated creatinine clearance is 37.6 ml/min (by C-G formula based on Cr of 0.73). ------------------------------------------------------------------------------------------------------------------ No results found for this basename: HGBA1C,  in the last 72 hours ------------------------------------------------------------------------------------------------------------------ No  results found for this basename: CHOL, HDL, LDLCALC, TRIG, CHOLHDL, LDLDIRECT,  in the last 72 hours ------------------------------------------------------------------------------------------------------------------  Recent Labs  02/16/13 1534  TSH 2.039   ------------------------------------------------------------------------------------------------------------------  Recent Labs  02/16/13 1534  VITAMINB12 353    Coagulation profile No results found for this basename: INR, PROTIME,  in the last 168 hours  No results found for this basename: DDIMER,  in the last 72 hours  Cardiac Enzymes No results found for this basename: CK, CKMB, TROPONINI, MYOGLOBIN,  in the last 168 hours ------------------------------------------------------------------------------------------------------------------ No components found with this basename: POCBNP,     Babbette Dalesandro D.O. on 02/17/2013 at 11:42 AM  Between 7am to 7pm - Pager - [phone]  After 7pm go to [email or web address] - password [email or web address]  And look for the night coverage person covering for me after hours  [facility] Office  [phone]

## 2013-02-17 NOTE — Evaluation (Signed)
Physical Therapy Evaluation Patient Details Name: Marisa Smith MRN: [id number] DOB: 1924/11/26 Today's Date: 02/17/2013 Time: [id number] PT Time Calculation (min): 21 min  PT Assessment / Plan / Recommendation History of Present Illness  pt presents after a fall resulting in L Radius fx s/p ORIF.    Clinical Impression  Pt anxious with mobility and needs cueing for attending to task and for encouragement.  Unclear level of support pt would have at home, so SNF would be safest D/C plan at this time.  Will continue to follow.      PT Assessment  Patient needs continued PT services    Follow Up Recommendations  SNF    Does the patient have the potential to tolerate intense rehabilitation      Barriers to Discharge Decreased caregiver support      Equipment Recommendations  None recommended by PT    Recommendations for Other Services     Frequency Min 3X/week    Precautions / Restrictions Precautions Precautions: Fall Required Braces or Orthoses: Sling Restrictions Weight Bearing Restrictions: Yes LUE Weight Bearing: Non weight bearing   Pertinent Vitals/Pain Does not rate but indicates pain during mobility.        Mobility  Bed Mobility Overal bed mobility: Needs Assistance Bed Mobility: Supine to Sit Supine to sit: Mod assist;HOB elevated General bed mobility comments: cues for sequencing and encouragement.   Transfers Overall transfer level: Needs assistance Transfers: Sit to/from Stand;Stand Pivot Transfers Sit to Stand: Mod assist Stand pivot transfers: Mod assist General transfer comment: pt with posterior lean and gets anxious with mobility.  pt needs cues to focus on task as she gets anxious and begins talking off topic.      Exercises     PT Diagnosis: Difficulty walking  PT Problem List: Decreased strength;Decreased activity tolerance;Decreased balance;Decreased mobility;Decreased cognition;Decreased knowledge of use of DME;Decreased safety  awareness;Decreased knowledge of precautions;Pain PT Treatment Interventions: DME instruction;Gait training;Functional mobility training;Therapeutic activities;Therapeutic exercise;Balance training;Patient/family education     PT Goals(Current goals can be found in the care plan section) Acute Rehab PT Goals Patient Stated Goal: Get better PT Goal Formulation: With patient Time For Goal Achievement: 03/03/13 Potential to Achieve Goals: Good  Visit Information  Last PT Received On: 02/17/13 Assistance Needed: +1 History of Present Illness: pt presents after a fall resulting in L Radius fx s/p ORIF.         Prior Functioning  Home Living Family/patient expects to be discharged to:: Skilled nursing facility Prior Function Level of Independence: Independent with assistive device(s) Communication Communication: No difficulties    Cognition  Cognition Arousal/Alertness: Awake/alert Behavior During Therapy: WFL for tasks assessed/performed Overall Cognitive Status: No family/caregiver present to determine baseline cognitive functioning    Extremity/Trunk Assessment Upper Extremity Assessment Upper Extremity Assessment: Defer to OT evaluation Lower Extremity Assessment Lower Extremity Assessment: Generalized weakness   Balance    End of Session PT - End of Session Equipment Utilized During Treatment: Gait belt Activity Tolerance: Patient tolerated treatment well Patient left: in chair;with call bell/phone within reach Nurse Communication: Mobility status  GP     Sunny SchleinRitenour, Merinda Victorino F, [place] CarolinaPT [phone] 02/17/2013, 1:20 PM

## 2013-02-18 ENCOUNTER — Encounter (HOSPITAL_COMMUNITY): Payer: Self-pay | Admitting: Orthopedic Surgery

## 2013-02-18 DIAGNOSIS — F321 Major depressive disorder, single episode, moderate: Secondary | ICD-10-CM

## 2013-02-18 LAB — BASIC METABOLIC PANEL
BUN: 21 mg/dL (ref 6–23)
CO2: 23 mEq/L (ref 19–32)
CREATININE: 0.62 mg/dL (ref 0.50–1.10)
Calcium: 8.1 mg/dL — ABNORMAL LOW (ref 8.4–10.5)
Chloride: 94 mEq/L — ABNORMAL LOW (ref 96–112)
GFR, EST NON AFRICAN AMERICAN: 79 mL/min — AB (ref >90)
Glucose, Bld: 124 mg/dL — ABNORMAL HIGH (ref 70–99)
Potassium: 3.9 mEq/L (ref 3.7–5.3)
Sodium: 129 mEq/L — ABNORMAL LOW (ref 137–147)

## 2013-02-18 LAB — CBC
HCT: 25.9 % — ABNORMAL LOW (ref 36.0–46.0)
Hemoglobin: 9 g/dL — ABNORMAL LOW (ref 12.0–15.0)
MCH: 34 pg (ref 26.0–34.0)
MCHC: 34.7 g/dL (ref 30.0–36.0)
MCV: 97.7 fL (ref 78.0–100.0)
PLATELETS: 132 10*3/uL — AB (ref 150–400)
RBC: 2.65 MIL/uL — ABNORMAL LOW (ref 3.87–5.11)
RDW: 13.1 % (ref 11.5–15.5)
WBC: 5.6 10*3/uL (ref 4.0–10.5)

## 2013-02-18 MED ORDER — ENSURE COMPLETE PO LIQD: 237.0000 mL | Freq: Two times a day (BID) | ORAL | Status: DC

## 2013-02-18 MED ORDER — LORAZEPAM 0.5 MG PO TABS: 0.5000 mg | ORAL_TABLET | Freq: Two times a day (BID) | ORAL | Status: DC

## 2013-02-18 MED ORDER — HYDROCODONE-ACETAMINOPHEN 5-325 MG PO TABS: 1.0000 | ORAL_TABLET | ORAL | Status: DC | PRN

## 2013-02-18 NOTE — Discharge Summary (Addendum)
Physician Discharge Summary  Marisa Smith ZOX:[id number] DOB: 25-Aug-1924 DOA: 02/16/2013  PCP: Rene Paci, MD  Admit date: 02/16/2013 Discharge date: 02/18/2013  Time spent: 35 minutes  Recommendations for Outpatient Follow-up:  Patient will be discharged to nursing home facility. She will need continued physical therapy as well as occupational therapy as recommended by the nursing home. Patient should follow up with her primary care physician within one to 2 weeks of discharge as well as Dr. Melvyn Novas. She continue taking her medications as prescribed.   Discharge Diagnoses:  Primary  Left proximal humeral fracture, left distal radial fracture Active Problems:   HYPOTHYROIDISM   Unspecified vitamin D deficiency   DEPRESSION, MAJOR, MODERATE   ANXIETY   HYPERTENSION, BENIGN ESSENTIAL   Protein-calorie malnutrition, severe   Discharge Condition: Stable  Diet recommendation: Heart healthy  Filed Weights   02/16/13 1513  Weight: 47.2 kg (104 lb 0.9 oz)    History of present illness:  Marisa Smith is a 80 y.o. female  This is an 80 year old female with a history of hypertension, depression that presents status post fall. The patient was found on the floor by her neighbor who called EMS. Patient initially reported a mechanical fall however at this time patient is on able to answer any questions or to a given history. No family members are present. Patient was found to have a deformity of proximal humerus and distal radius on the left side. Per ER attending, patient's daughter was present initially and today the patient does live independently however does not drive or cook for herself however is able to tend to some of her daily activities.  Hospital Course:  Left proximal humeral fracture, left distal radial fracture secondary to ground-level fall  -Postop day 2, status post ORIF for left distal radial fracture and closed manipulation of left shoulder by Dr. Melvyn Novas -Patient will  need to follow up with Dr. Melvyn Novas -Continue pain control  -Patient will be discharged to SNF for rehab  Altered mental status  -Improved, Likely secondary to pain medications as well as patient's pain  -Ammonia level found to be 31, TSH 2.036, vitamin B12 353, folate pending   Anemia  -Likely secondary to acute blood loss from surgery as well as torsional component  -H/H currently stable at 9/25.9  Chronic hyponatremia  -baseline sodium 130, currently 129 -TSH 2.036  Accelerated hypertension  -Resolved, Likely secondary to pain  -Currently controlled, continue ARB  Hypothyroidism  -Continue Synthroid  -TSH 2.039   Depression/ anxiety  -Continue Nardil, Ativan and Remeron, which were initially held due to altered mental status   Severe protein calorie malnutrition  -Nutritionist consulted  -Continue supplementation with Ensure   Vitamin D deficiency  -Continue vitamin D supplementation   Procedures: Left ORIF distal radial fracture  Closed manipulation of left shoulder  Consultations: Orthopedic surgery, Dr. Melvyn Novas  Discharge Exam: Filed Vitals:   02/18/13 0516  BP: 117/57  Pulse: 86  Temp: 98.5 F (36.9 C)  Resp: 16   Exam  General: Well developed, well nourished, NAD, appears stated age  HEENT: NCAT, PERRLA, EOMI, Anicteic Sclera, mucous membranes moist. Neck: Supple, no JVD, no masses  Cardiovascular: S1 S2 auscultated, no rubs, murmurs or gallops. Regular rate and rhythm.  Respiratory: Clear to auscultation bilaterally with equal chest rise  Abdomen: Soft, nontender, nondistended, + bowel sounds  Extremities: warm dry without cyanosis clubbing or edema. Some swelling noted in left shoulder with erythema. Left Arm currently in splint Neuro: AAOx3, cranial nerves grossly  intact. Strength equal and bilaterally in lower ext, and 5/5 in RUE, able to move fingers on left hand, splint in place Skin: Without rashes exudates or nodules  Psych: Normal affect and  demeanor with intact judgement and insight  Discharge Instructions  Discharge Orders   Future Appointments Provider Department Dept Phone   02/24/2013 3:00 PM Marlowe Aschoff, DPM [facility] [phone]   07/06/2013 10:45 AM Newt Lukes, MD [facility] [phone]   Future Orders Complete By Expires   Diet - low sodium heart healthy  As directed    Discharge instructions  As directed    Comments:     Patient will be discharged to nursing home facility. She will need continued physical therapy as well as occupational therapy as recommended by the nursing home. Patient should follow up with her primary care physician within one to 2 weeks of discharge as well as Dr. Melvyn Novas. She continue taking her medications as prescribed.   Increase activity slowly  As directed        Medication List         aspirin EC 81 MG tablet  Take 81 mg by mouth daily.     calcium gluconate 500 MG tablet  Take 500 mg by mouth daily.     feeding supplement (ENSURE COMPLETE) Liqd  Take 237 mLs by mouth 2 (two) times daily between meals.     HYDROcodone-acetaminophen 5-325 MG per tablet  Commonly known as:  NORCO/VICODIN  Take 1-2 tablets by mouth every 4 (four) hours as needed for moderate pain.     levothyroxine 50 MCG tablet  Commonly known as:  SYNTHROID, LEVOTHROID  Take 50 mcg by mouth daily before breakfast.     LORazepam 0.5 MG tablet  Commonly known as:  ATIVAN  Take 1 tablet (0.5 mg total) by mouth 2 (two) times daily.     mercaptopurine 50 MG tablet  Commonly known as:  PURINETHOL  Take 25 mg by mouth daily. Give on an empty stomach 1 hour before or 2 hours after meals. Caution: Chemotherapy.     mirtazapine 45 MG tablet  Commonly known as:  REMERON  Take 1 tablet (45 mg total) by mouth at bedtime.     olmesartan 40 MG tablet  Commonly known as:  BENICAR  Take 40 mg by mouth daily.     phenelzine 15 MG tablet  Commonly known as:   NARDIL  Take 7.5 mg by mouth 3 (three) times daily.     Vitamin D3 2000 UNITS Tabs  Take 2,000 Units by mouth daily.       Allergies  Allergen Reactions  . Amlodipine Besylate     REACTION: itching  . Azathioprine     REACTION: nausea  . Penicillins     REACTION: yeast infection  . Sertraline Hcl     REACTION: rash  . Sulfonamide Derivatives     REACTION: swelling and itching       Follow-up Information   Follow up with Rene Paci, MD. Schedule an appointment as soon as possible for a visit in 1 week.   Specialty:  Internal Medicine   Contact information:   [address] [place] [address] [phone]       Schedule an appointment as soon as possible for a visit with Sharma Covert, MD.   Specialty:  Orthopedic Surgery   Contact information:   [address] [place] [address] [phone]        The results of significant diagnostics from this hospitalization (including imaging, microbiology, ancillary and laboratory) are listed below for reference.    Significant Diagnostic Studies: Dg Wrist Complete Left  02/21/2013   CLINICAL DATA:  Fall, bruising and swelling left wrist  EXAM: LEFT WRIST - COMPLETE 3+ VIEW  COMPARISON:  None  FINDINGS: Osseous demineralization.  Comminuted displaced distal left radial metaphyseal fracture with apex volar angulation.  Intra-articular extension at the radiocarpal joint and question the distal radioulnar joint.  Secondary ulnar plus variance.  Degenerative changes at distal pole scaphoid.  Associated soft tissue swelling at wrist.  Questionable tiny avulsion fracture at tip of ulnar styloid process.  No additional fracture, dislocation or bone destruction.  Scattered vascular calcifications.  IMPRESSION: Comminuted displaced and angulated distal left radial metaphyseal fracture with intra-articular extension at the radiocarpal and question distal radioulnar joints.  Suspect  tiny avulsion fracture tip of ulnar styloid.   Electronically Signed   By: Ulyses Southward M.D.   On: 2013-02-21 10:20   Ct Head Wo Contrast  02/21/2013   CLINICAL DATA:  Status post fall, but able to control movements  EXAM: CT HEAD WITHOUT CONTRAST  CT CERVICAL SPINE WITHOUT CONTRAST  TECHNIQUE: Multidetector CT imaging of the head and cervical spine was performed following the standard protocol without intravenous contrast. Multiplanar CT image reconstructions of the cervical spine were also generated.  COMPARISON:  MRI the brain of November 24, 2011 and CT scan of the brain of November 23, 2011.  FINDINGS: CT HEAD FINDINGS  There is mild diffuse cerebral and cerebellar atrophy with compensatory ventriculomegaly which has progressed slightly since the previous CT scan. There is decreased density in the deep white matter of both cerebral hemispheres consistent with chronic small vessel ischemic type change. There is no acute intracranial hemorrhage nor evidence of an evolving ischemic infarction. The cerebellum and brainstem exhibit no acute abnormalities.  At bone window settings the observed portions of the paranasal sinuses and right mastoid air cells are clear. On the left there is some fluid within the mastoid air cells. There is no evidence of an acute skull fracture.  CT CERVICAL SPINE FINDINGS  The cervical vertebral bodies are preserved in height. There is disc space narrowing at C4-5 and to a lesser extent at C5-6 and C6-7. There are posterior endplate osteophytes at these levels. The prevertebral soft tissue spaces appear normal. There is no evidence of a perched facet or facet or spinous process fracture. The odontoid is intact and the lateral masses of C1 align normally with those of C2. The bony ring at each cervical level is intact. There is facet joint degenerative change at multiple levels.  The soft tissues of the neck exhibit no evidence of hematomas. The pulmonary apices exhibit fibrotic change  but no pneumothorax. The observed portions of the first and second ribs appear intact.  IMPRESSION: 1. There is no evidence of an acute intracranial hemorrhage nor of an acute ischemic infarction. 2. There are age related white matter hypodensities consistent with chronic small vessel ischemic change. 3. There is no evidence of an acute skull fracture. There is chronic opacification within left mastoid air cells which may reflect inflammation. 4. There are degenerative changes of the cervical spine but there is no evidence of an acute fracture nor dislocation.   Electronically Signed   By: David  [place]   On: 02-21-2013 11:40   Ct Cervical Spine Wo  Contrast  02/16/2013   CLINICAL DATA:  Status post fall, but able to control movements  EXAM: CT HEAD WITHOUT CONTRAST  CT CERVICAL SPINE WITHOUT CONTRAST  TECHNIQUE: Multidetector CT imaging of the head and cervical spine was performed following the standard protocol without intravenous contrast. Multiplanar CT image reconstructions of the cervical spine were also generated.  COMPARISON:  MRI the brain of November 24, 2011 and CT scan of the brain of November 23, 2011.  FINDINGS: CT HEAD FINDINGS  There is mild diffuse cerebral and cerebellar atrophy with compensatory ventriculomegaly which has progressed slightly since the previous CT scan. There is decreased density in the deep white matter of both cerebral hemispheres consistent with chronic small vessel ischemic type change. There is no acute intracranial hemorrhage nor evidence of an evolving ischemic infarction. The cerebellum and brainstem exhibit no acute abnormalities.  At bone window settings the observed portions of the paranasal sinuses and right mastoid air cells are clear. On the left there is some fluid within the mastoid air cells. There is no evidence of an acute skull fracture.  CT CERVICAL SPINE FINDINGS  The cervical vertebral bodies are preserved in height. There is disc space narrowing at C4-5 and  to a lesser extent at C5-6 and C6-7. There are posterior endplate osteophytes at these levels. The prevertebral soft tissue spaces appear normal. There is no evidence of a perched facet or facet or spinous process fracture. The odontoid is intact and the lateral masses of C1 align normally with those of C2. The bony ring at each cervical level is intact. There is facet joint degenerative change at multiple levels.  The soft tissues of the neck exhibit no evidence of hematomas. The pulmonary apices exhibit fibrotic change but no pneumothorax. The observed portions of the first and second ribs appear intact.  IMPRESSION: 1. There is no evidence of an acute intracranial hemorrhage nor of an acute ischemic infarction. 2. There are age related white matter hypodensities consistent with chronic small vessel ischemic change. 3. There is no evidence of an acute skull fracture. There is chronic opacification within left mastoid air cells which may reflect inflammation. 4. There are degenerative changes of the cervical spine but there is no evidence of an acute fracture nor dislocation.   Electronically Signed   By: David  [place]   On: 02/16/2013 11:40   Dg Chest Portable 1 View  02/16/2013   CLINICAL DATA:  Fall  EXAM: PORTABLE CHEST - 1 VIEW  COMPARISON:  None.  FINDINGS: Cardiomediastinal silhouette is unremarkable. Hyperinflation. Probable chronic mild interstitial prominence. No acute infiltrate or pulmonary edema.  IMPRESSION: Hyperinflation. No active disease. Probable chronic mild interstitial prominence.   Electronically Signed   By: Natasha Mead M.D.   On: 02/16/2013 13:14   Dg Shoulder Left  02/16/2013   CLINICAL DATA:  Fall.  EXAM: LEFT SHOULDER - 2+ VIEW  COMPARISON:  None.  FINDINGS: Angulated fracture of the proximal left humerus at the diaphyseal metaphyseal junction is normal. Comminution is present. Prominent soft tissue swelling is present . No other focal abnormality identified  IMPRESSION: Angulated  comminuted fracture of proximal left humerus.   Electronically Signed   By: Maisie Fus  Register   On: 02/16/2013 10:20    Microbiology: Recent Results (from the past 240 hour(s))  MRSA PCR SCREENING     Status: None   Collection Time    02/16/13  5:24 PM      Result Value Range Status   MRSA by  PCR NEGATIVE  NEGATIVE Final   Comment:            The GeneXpert MRSA Assay (FDA     approved for NASAL specimens     only), is one component of a     comprehensive MRSA colonization     surveillance program. It is not     intended to diagnose MRSA     infection nor to guide or     monitor treatment for     MRSA infections.     Labs: Basic Metabolic Panel:  Recent Labs Lab 02/16/13 1028 02/16/13 1534 02/17/13 0250 02/18/13 0347  NA 132*  --  131* 129*  K 3.9  --  3.8 3.9  CL 93*  --  94* 94*  CO2 21  --  23 23  GLUCOSE 154*  --  103* 124*  BUN 20  --  19 21  CREATININE 0.74 0.65 0.73 0.62  CALCIUM 9.0  --  8.0* 8.1*   Liver Function Tests: No results found for this basename: AST, ALT, ALKPHOS, BILITOT, PROT, ALBUMIN,  in the last 168 hours No results found for this basename: LIPASE, AMYLASE,  in the last 168 hours  Recent Labs Lab 02/16/13 2226  AMMONIA 31   CBC:  Recent Labs Lab 02/16/13 1028 02/16/13 1534 02/17/13 0250 02/18/13 0347  WBC 6.3 5.6 5.0 5.6  NEUTROABS 5.0  --   --   --   HGB 11.7* 10.9* 9.3* 9.0*  HCT 32.7* 30.5* 26.9* 25.9*  MCV 95.9 96.5 98.5 97.7  PLT 152 144* 126* 132*   Cardiac Enzymes: No results found for this basename: CKTOTAL, CKMB, CKMBINDEX, TROPONINI,  in the last 168 hours BNP: BNP (last 3 results) No results found for this basename: PROBNP,  in the last 8760 hours CBG: No results found for this basename: GLUCAP,  in the last 168 hours     Signed:  Edsel PetrinMIKHAIL, Alvy Alsop  [facility] 02/18/2013, 12:21 PM

## 2013-02-18 NOTE — Progress Notes (Signed)
CSW (Clinical Social Worker) prepared pt dc packet and placed with shadow chart. CSW arranged non-emergent ambulance transport. Pt, pt family, pt nurse, and facility informed. CSW signing off.  Colonel Krauser, LCSWA [phone]  

## 2013-02-18 NOTE — Progress Notes (Addendum)
CSW (Clinical Child psychotherapistocial Worker) called pt daughter for the second time and left message to ask for SS#. CSW awaiting this to submit for PASRR. CSW to also provide bed offers when call is returned.   ADDENDUM: CSW spoke with pt daughter and provided with bed offers. CSW also explained current [employer] insurance authorization status and that some facilities are able to accept pt's with a "pending auth" while others are not. CSW explained that if pt is medically ready and insurance auth has not been received pt family will need to choose a facility that is able to accept pt when ready. Pt family understanding of this. Pt family wanting CSW to check with [facility] to see if they can accept pt. Otherwise, pt family willing [facility]  Riker Collier, LCSWA [phone]

## 2013-02-18 NOTE — Op Note (Signed)
NAMGenia Harold:  [place]sham, Mehak                 ACCOUNT NO.:  [id number]  MEDICAL RECORD NO.:  [id number]  LOCATION:  [id number]                        FACILITY:  [facility]  PHYSICIAN:  Madelynn DoneFred W Frannie Shedrick IV, MD  DATE OF BIRTH:  08/07/1924  DATE OF PROCEDURE: DATE OF DISCHARGE:                              OPERATIVE REPORT   POSTOPERATIVE DIAGNOSES: 1. Left wrist intra-articular distal radius fracture, 3 or more     fragments. 2. Left proximal humerus fracture.  POSTOPERATIVE DIAGNOSES: 1. Left wrist intra-articular distal radius fracture, 3 or more     fragments. 2. Left proximal humerus fracture.  ATTENDING PHYSICIAN:  Madelynn DoneFred W Sehaj Mcenroe IV, MD, who scrubbed and present for the entire procedure.  ASSISTANT SURGEON:  None.  ANESTHESIA:  General via LMA.  SURGICAL IMPLANTS:  DePuy narrow Crosslock plate with a combination of locking and nonlocking screws proximally and distal locking pegs.  SURGICAL PROCEDURES: 1. Open treatment of left wrist intra-articular distal radius     fracture, 3 or more fragments. 2. Left wrist brachioradialis tenotomy and release. 3. Radiographs 3 views left wrist. 4. Closed reduction and closed manipulation of the proximal humerus,     fracture requiring anesthesia.  SURGICAL INDICATIONS:  Marisa Smith is a right-hand dominant female, who fell earlier today, sustaining a closed proximal humerus and left intra- articular distal radius fracture.  The patient was seen and evaluated, recommended to undergo the above procedure after medical clearance. Consent was obtained from the daughter.  Risks, benefits, and alternatives were discussed in detail with the daughter and signed informed consent was obtained.  Risks include, but not limited to bleeding, infection, damage to nearby nerves, arteries, or tendons, nonunion, malunion, hardware failure, loss of motion of wrist and digits, incomplete relief of symptoms, and need for further surgical intervention.  DESCRIPTION OF  PROCEDURE:  The patient was properly identified in the preop holding area, marker made on the left wrist to indicate the correct operative site.  The patient was then brought back to the operating room, placed supine on the anesthesia table.  General anesthesia was administered.  The patient received preoperative antibiotics prior to skin incision.  A well-padded tourniquet was then placed on the left brachium sealed with 1000 drape.  Left upper extremity was then prepped and draped in normal sterile fashion.  Time- out was called.  Correct site was identified, and procedure then begun. Attention was then turned to the left wrist where a longitudinal incision made directly over the FCR sheath.  The limb was elevated, tourniquet inflated.  Dissection was carried down through the skin and subcutaneous tissue.  The FCR sheath was then opened proximally and distally.  The FPL was then carefully retracted out of the way and an L- shaped pronator quadratus flap was then elevated.  The intra-articular fracture was then carefully identified.  In order to reduce the radial column, the brachioradialis was then carefully released off the radial styloid.  Careful reduction was then carried out of the intra-articular fracture of three more fragments.  An open reduction was then performed and the volar plate was then applied.  This is a Fish farm managerDePuy Crosslock volar plate.  This was a narrow  plate.  Once this was carried out, a K-wire was then used distally.  The plate was then confirmed using the mini C- arm.  Oblong screw hole was then placed proximally.  Following this, distal fixation was then carried out from ulnar to radial direction with the distal locking pegs.  Following this, 4 more screws were then placed proximally, 2 locking and 2 nonlocking screws.  Final stress radiographs were then obtained.  AP, lateral, and oblique views looking down the joint did show the internal fixation in place, in good  position.  The wound was then copiously irrigated.  The wound remaining of the pronator quadratus was then closed with 2-0 Vicryl.  Subcutaneous tissues closed with 4-0 Vicryl and the skin closed with simple 4-0 Prolene.  Adaptic dressing, sterile compressive bandage were then applied.  The patient was then placed in a well-padded sugar-tong splint.  Following this, closed manipulation was then performed of the shoulder, reduced the shoulder and took her out of varus and this was confirmed using the mini C-arm.  Bone aligned the humeral shaft nicely under the head.  The patient was then placed in a shoulder sling.  The patient then extubated, taken to recovery room in good condition.  INTRAOPERATIVE RADIOGRAPHS:  Three views of the wrist did show the internal fixation in place.  There was good position in both planes.  POSTOPERATIVE PLAN:  The patient will be admitted back to the Internal Medicine service, be discharged according to the Medicine Service. Nonweightbearing in the left upper extremity.  Sling for the proximal humerus fracture.  Plan to see her back in the office in approximately 10-14 days for repeat radiographs of the shoulder and her wrist, wrist out of the splint and then likely back into a short-arm cast for a total of 2 more weeks, and then begin the therapy regimen of the both shoulder and wrist at the 4-week mark, radiographs of the shoulder and wrist at each visit.     Madelynn Done, MD     FWO/MEDQ  D:  02/16/2013  T:  02/17/2013  Job:  [id number]

## 2013-02-18 NOTE — Discharge Instructions (Signed)

## 2013-02-18 NOTE — Evaluation (Signed)
Occupational Therapy Evaluation and Discharge Patient Details Name: Marisa Smith MRN: [id number] DOB: 05-May-1924 Today's Date: 02/18/2013 Time: 0923-3007 OT Time Calculation (min): 17 min  OT Assessment / Plan / Recommendation History of present illness pt presents after a fall resulting in L Radius fx s/p ORIF and left humeral neck fracture (closed reduction)     Clinical Impression   This 80 yo female admitted and underwent above presents to acute OT with decreased cognition (anxiety), increased pain LUE, decreased balance all affecting pt's ability to care for herself at home, will benefit from follow up OT at SNF, acute OT will sign off.    OT Assessment  All further OT needs can be met in the next venue of care    Follow Up Recommendations  SNF       Equipment Recommendations   (TBD next venue)          Precautions / Restrictions Precautions Precautions: Fall;Shoulder Shoulder Interventions: At all times;Shoulder sling/immobilizer (due to closed reduction humeral neck fracture) Required Braces or Orthoses: Sling Restrictions Weight Bearing Restrictions: Yes LUE Weight Bearing: Non weight bearing   Pertinent Vitals/Pain Occasional twinge where pt would grimace while working with me    ADL  Eating/Feeding: Supervision/safety;Set up Where Assessed - Eating/Feeding: Chair Grooming: Wash/dry hands;Min guard Where Assessed - Grooming: Unsupported standing Upper Body Bathing: Minimal assistance Where Assessed - Upper Body Bathing: Unsupported sitting Lower Body Bathing: Moderate assistance Where Assessed - Lower Body Bathing: Supported sit to stand Upper Body Dressing: Maximal assistance Where Assessed - Upper Body Dressing: Unsupported sitting Lower Body Dressing: Maximal assistance Where Assessed - Lower Body Dressing: Supported sit to stand Toilet Transfer: Minimal assistance Toilet Transfer Method: Sit to Loss adjuster, chartered: Comfort height toilet;Grab  bars Toileting - Clothing Manipulation and Hygiene: Moderate assistance Where Assessed - Toileting Clothing Manipulation and Hygiene: Sit to stand from 3-in-1 or toilet Equipment Used:  (sling) Transfers/Ambulation Related to ADLs: min A  (HHA) for all    OT Diagnosis: Generalized weakness;Cognitive deficits;Acute pain  OT Problem List: Decreased strength;Decreased range of motion;Decreased cognition;Impaired balance (sitting and/or standing);Impaired UE functional use;Pain  OT Goals(Current goals can be found in the care plan section) Acute Rehab OT Goals Patient Stated Goal: wants her clothes  Visit Information  Last OT Received On: 02/18/13 Assistance Needed: +1 History of Present Illness: pt presents after a fall resulting in L Radius fx s/p ORIF and left humeral neck fracture (closed reduction)         Prior [place] expects to be discharged to:: Skilled nursing facility Prior Function Level of Independence: Independent Communication Communication: No difficulties Dominant Hand: Right         Vision/Perception Vision - History Patient Visual Report: No change from baseline   Cognition  Cognition Arousal/Alertness: Awake/alert Behavior During Therapy: Anxious (about leaving and having no clothes) Overall Cognitive Status: No family/caregiver present to determine baseline cognitive functioning    Extremity/Trunk Assessment Upper Extremity Assessment Upper Extremity Assessment: LUE deficits/detail LUE Deficits / Details: ORIF forearm and closed reduction left humeral neck; edema in fingers causing decreased AROM and with attempt at PROM pt c/o pain LUE Coordination: decreased gross motor;decreased fine motor     Mobility Bed Mobility General bed mobility comments: Pt up in recliner upon my arrival Transfers Overall transfer level: Needs assistance Equipment used: 1 person hand held assist Transfers: Sit to/from Stand Sit to  Stand: Min assist Stand pivot transfers: [place]  transfer comment: No posterior lean this session nor anxiety about being up on her feet (only anxiety is that she did not have clothes)           End of Session OT - End of Session Activity Tolerance: Patient tolerated treatment well Patient left: in chair;with call bell/phone within reach (the way I found her)       Almon Register [phone] 02/18/2013, 12:57 PM

## 2013-02-22 ENCOUNTER — Inpatient Hospital Stay (HOSPITAL_COMMUNITY)
Admission: EM | Admit: 2013-02-22 | Discharge: 2013-02-28 | DRG: 492 | Disposition: A | Discharge status: Skilled Nursing Facility | Payer: Medicare Other | Attending: Orthopedic Surgery | Admitting: Orthopedic Surgery

## 2013-02-22 ENCOUNTER — Emergency Department (HOSPITAL_COMMUNITY): Payer: Medicare Other

## 2013-02-22 ENCOUNTER — Encounter (HOSPITAL_COMMUNITY): Payer: Self-pay | Admitting: Emergency Medicine

## 2013-02-22 ENCOUNTER — Other Ambulatory Visit: Payer: Self-pay | Admitting: Orthopedic Surgery

## 2013-02-22 DIAGNOSIS — E871 Hypo-osmolality and hyponatremia: Secondary | ICD-10-CM | POA: Diagnosis present

## 2013-02-22 DIAGNOSIS — S42209A Unspecified fracture of upper end of unspecified humerus, initial encounter for closed fracture: Secondary | ICD-10-CM | POA: Diagnosis present

## 2013-02-22 DIAGNOSIS — S42309A Unspecified fracture of shaft of humerus, unspecified arm, initial encounter for closed fracture: Principal | ICD-10-CM | POA: Diagnosis present

## 2013-02-22 DIAGNOSIS — Z7982 Long term (current) use of aspirin: Secondary | ICD-10-CM

## 2013-02-22 DIAGNOSIS — W19XXXA Unspecified fall, initial encounter: Secondary | ICD-10-CM | POA: Diagnosis present

## 2013-02-22 DIAGNOSIS — M810 Age-related osteoporosis without current pathological fracture: Secondary | ICD-10-CM | POA: Diagnosis present

## 2013-02-22 DIAGNOSIS — R259 Unspecified abnormal involuntary movements: Secondary | ICD-10-CM

## 2013-02-22 DIAGNOSIS — F329 Major depressive disorder, single episode, unspecified: Secondary | ICD-10-CM | POA: Diagnosis present

## 2013-02-22 DIAGNOSIS — Z880 Allergy status to penicillin: Secondary | ICD-10-CM

## 2013-02-22 DIAGNOSIS — F411 Generalized anxiety disorder: Secondary | ICD-10-CM | POA: Diagnosis present

## 2013-02-22 DIAGNOSIS — S5290XA Unspecified fracture of unspecified forearm, initial encounter for closed fracture: Secondary | ICD-10-CM | POA: Diagnosis present

## 2013-02-22 DIAGNOSIS — Z9849 Cataract extraction status, unspecified eye: Secondary | ICD-10-CM

## 2013-02-22 DIAGNOSIS — I498 Other specified cardiac arrhythmias: Secondary | ICD-10-CM

## 2013-02-22 DIAGNOSIS — F3289 Other specified depressive episodes: Secondary | ICD-10-CM | POA: Diagnosis present

## 2013-02-22 DIAGNOSIS — E039 Hypothyroidism, unspecified: Secondary | ICD-10-CM | POA: Diagnosis present

## 2013-02-22 DIAGNOSIS — Z823 Family history of stroke: Secondary | ICD-10-CM

## 2013-02-22 DIAGNOSIS — I10 Essential (primary) hypertension: Secondary | ICD-10-CM | POA: Diagnosis present

## 2013-02-22 DIAGNOSIS — Z888 Allergy status to other drugs, medicaments and biological substances status: Secondary | ICD-10-CM

## 2013-02-22 DIAGNOSIS — Z882 Allergy status to sulfonamides status: Secondary | ICD-10-CM

## 2013-02-22 DIAGNOSIS — Z8673 Personal history of transient ischemic attack (TIA), and cerebral infarction without residual deficits: Secondary | ICD-10-CM

## 2013-02-22 DIAGNOSIS — S52599A Other fractures of lower end of unspecified radius, initial encounter for closed fracture: Secondary | ICD-10-CM | POA: Diagnosis present

## 2013-02-22 DIAGNOSIS — R7309 Other abnormal glucose: Secondary | ICD-10-CM | POA: Diagnosis present

## 2013-02-22 DIAGNOSIS — D649 Anemia, unspecified: Secondary | ICD-10-CM | POA: Diagnosis present

## 2013-02-22 DIAGNOSIS — K754 Autoimmune hepatitis: Secondary | ICD-10-CM | POA: Diagnosis present

## 2013-02-22 DIAGNOSIS — Z79899 Other long term (current) drug therapy: Secondary | ICD-10-CM

## 2013-02-22 DIAGNOSIS — Z8249 Family history of ischemic heart disease and other diseases of the circulatory system: Secondary | ICD-10-CM

## 2013-02-22 DIAGNOSIS — E559 Vitamin D deficiency, unspecified: Secondary | ICD-10-CM | POA: Diagnosis present

## 2013-02-22 DIAGNOSIS — E41 Nutritional marasmus: Secondary | ICD-10-CM | POA: Diagnosis present

## 2013-02-22 DIAGNOSIS — R001 Bradycardia, unspecified: Secondary | ICD-10-CM

## 2013-02-22 LAB — CBC WITH DIFFERENTIAL/PLATELET
BASOS ABS: 0 10*3/uL (ref 0.0–0.1)
BASOS PCT: 0 % (ref 0–1)
EOS PCT: 1 % (ref 0–5)
Eosinophils Absolute: 0.1 10*3/uL (ref 0.0–0.7)
HCT: 27 % — ABNORMAL LOW (ref 36.0–46.0)
Hemoglobin: 9.2 g/dL — ABNORMAL LOW (ref 12.0–15.0)
LYMPHS PCT: 12 % (ref 12–46)
Lymphs Abs: 0.9 10*3/uL (ref 0.7–4.0)
MCH: 34.1 pg — ABNORMAL HIGH (ref 26.0–34.0)
MCHC: 34.1 g/dL (ref 30.0–36.0)
MCV: 100 fL (ref 78.0–100.0)
Monocytes Absolute: 1.2 10*3/uL — ABNORMAL HIGH (ref 0.1–1.0)
Monocytes Relative: 16 % — ABNORMAL HIGH (ref 3–12)
Neutro Abs: 5.6 10*3/uL (ref 1.7–7.7)
Neutrophils Relative %: 72 % (ref 43–77)
Platelets: 252 10*3/uL (ref 150–400)
RBC: 2.7 MIL/uL — ABNORMAL LOW (ref 3.87–5.11)
RDW: 13.9 % (ref 11.5–15.5)
WBC: 7.8 10*3/uL (ref 4.0–10.5)

## 2013-02-22 LAB — BASIC METABOLIC PANEL
BUN: 22 mg/dL (ref 6–23)
CALCIUM: 8.6 mg/dL (ref 8.4–10.5)
CO2: 27 meq/L (ref 19–32)
Chloride: 96 mEq/L (ref 96–112)
Creatinine, Ser: 0.67 mg/dL (ref 0.50–1.10)
GFR calc non Af Amer: 77 mL/min — ABNORMAL LOW (ref >90)
GFR, EST AFRICAN AMERICAN: 90 mL/min — AB (ref >90)
Glucose, Bld: 117 mg/dL — ABNORMAL HIGH (ref 70–99)
Potassium: 4.9 mEq/L (ref 3.7–5.3)
Sodium: 133 mEq/L — ABNORMAL LOW (ref 137–147)

## 2013-02-22 MED ORDER — LORAZEPAM 0.5 MG PO TABS
0.5000 mg | ORAL_TABLET | Freq: Two times a day (BID) | ORAL | Status: DC
  Administered 2013-02-23 – 2013-02-28 (×10): 0.5 mg via ORAL
  Filled 2013-02-22 (×10): qty 1

## 2013-02-22 MED ORDER — IRBESARTAN 300 MG PO TABS
300.0000 mg | ORAL_TABLET | Freq: Every day | ORAL | Status: DC
  Administered 2013-02-23 – 2013-02-25 (×2): 300 mg via ORAL
  Filled 2013-02-22 (×3): qty 1

## 2013-02-22 MED ORDER — BUSPIRONE HCL 15 MG PO TABS: 7.5000 mg | ORAL_TABLET | Freq: Two times a day (BID) | ORAL | Status: DC

## 2013-02-22 MED ORDER — HYDROCODONE-ACETAMINOPHEN 5-325 MG PO TABS
1.0000 | ORAL_TABLET | Freq: Four times a day (QID) | ORAL | Status: DC | PRN
  Administered 2013-02-23: 2 via ORAL
  Administered 2013-02-23: 1 via ORAL
  Administered 2013-02-23: 2 via ORAL
  Filled 2013-02-22: qty 2
  Filled 2013-02-22 (×2): qty 1
  Filled 2013-02-22: qty 2

## 2013-02-22 MED ORDER — ONDANSETRON HCL 4 MG/2ML IJ SOLN
4.0000 mg | Freq: Once | INTRAMUSCULAR | Status: AC
  Administered 2013-02-22: 4 mg via INTRAVENOUS
  Filled 2013-02-22: qty 2

## 2013-02-22 MED ORDER — VITAMIN D3 25 MCG (1000 UNIT) PO TABS
2000.0000 [IU] | ORAL_TABLET | Freq: Every day | ORAL | Status: DC
  Administered 2013-02-23: 2000 [IU] via ORAL
  Filled 2013-02-22 (×2): qty 2

## 2013-02-22 MED ORDER — BUSPIRONE HCL 15 MG PO TABS
7.5000 mg | ORAL_TABLET | Freq: Two times a day (BID) | ORAL | Status: DC
  Administered 2013-02-22 – 2013-02-28 (×11): 7.5 mg via ORAL
  Filled 2013-02-22 (×13): qty 1

## 2013-02-22 MED ORDER — ASPIRIN EC 81 MG PO TBEC
81.0000 mg | DELAYED_RELEASE_TABLET | Freq: Every day | ORAL | Status: DC
  Administered 2013-02-23 – 2013-02-28 (×5): 81 mg via ORAL
  Filled 2013-02-22 (×6): qty 1

## 2013-02-22 MED ORDER — ONDANSETRON HCL 4 MG PO TABS: 4.0000 mg | ORAL_TABLET | Freq: Four times a day (QID) | ORAL | Status: DC | PRN

## 2013-02-22 MED ORDER — SODIUM CHLORIDE 0.9 % IV SOLN
INTRAVENOUS | Status: DC
  Administered 2013-02-23: 21:00:00 via INTRAVENOUS

## 2013-02-22 MED ORDER — ONDANSETRON HCL 4 MG/2ML IJ SOLN: 4.0000 mg | Freq: Four times a day (QID) | INTRAMUSCULAR | Status: DC | PRN

## 2013-02-22 MED ORDER — CALCIUM GLUCONATE 500 MG PO TABS: 500.0000 mg | ORAL_TABLET | Freq: Every day | ORAL | Status: DC

## 2013-02-22 MED ORDER — ALUM & MAG HYDROXIDE-SIMETH 200-200-20 MG/5ML PO SUSP: 30.0000 mL | Freq: Four times a day (QID) | ORAL | Status: DC | PRN

## 2013-02-22 MED ORDER — VITAMIN D3 50 MCG (2000 UT) PO TABS: 2000.0000 [IU] | ORAL_TABLET | Freq: Every day | ORAL | Status: DC

## 2013-02-22 MED ORDER — MIRTAZAPINE 45 MG PO TABS
45.0000 mg | ORAL_TABLET | Freq: Every day | ORAL | Status: DC
  Administered 2013-02-23 – 2013-02-27 (×5): 45 mg via ORAL
  Filled 2013-02-22 (×8): qty 1

## 2013-02-22 MED ORDER — HYDROMORPHONE HCL PF 1 MG/ML IJ SOLN: 0.5000 mg | INTRAMUSCULAR | Status: AC | PRN

## 2013-02-22 MED ORDER — ENSURE COMPLETE PO LIQD
237.0000 mL | Freq: Two times a day (BID) | ORAL | Status: DC
  Administered 2013-02-23 – 2013-02-28 (×6): 237 mL via ORAL

## 2013-02-22 MED ORDER — ONDANSETRON HCL 4 MG/2ML IJ SOLN: 4.0000 mg | Freq: Three times a day (TID) | INTRAMUSCULAR | Status: AC | PRN

## 2013-02-22 MED ORDER — LEVOTHYROXINE SODIUM 50 MCG PO TABS
50.0000 ug | ORAL_TABLET | Freq: Every day | ORAL | Status: DC
  Administered 2013-02-23 – 2013-02-28 (×6): 50 ug via ORAL
  Filled 2013-02-22 (×8): qty 1

## 2013-02-22 MED ORDER — ACETAMINOPHEN 325 MG PO TABS
650.0000 mg | ORAL_TABLET | Freq: Four times a day (QID) | ORAL | Status: DC | PRN
Start: 2013-02-22 — End: 2013-02-24

## 2013-02-22 MED ORDER — HYDROMORPHONE HCL PF 1 MG/ML IJ SOLN: 1.0000 mg | INTRAMUSCULAR | Status: DC | PRN

## 2013-02-22 MED ORDER — MERCAPTOPURINE 50 MG PO TABS
25.0000 mg | ORAL_TABLET | Freq: Every day | ORAL | Status: DC
  Administered 2013-02-23 – 2013-02-28 (×6): 25 mg via ORAL
  Filled 2013-02-22 (×7): qty 1

## 2013-02-22 MED ORDER — HYDROMORPHONE HCL PF 1 MG/ML IJ SOLN
0.5000 mg | Freq: Once | INTRAMUSCULAR | Status: AC
  Administered 2013-02-22: 0.5 mg via INTRAVENOUS
  Filled 2013-02-22: qty 1

## 2013-02-22 MED ORDER — PHENELZINE SULFATE 15 MG PO TABS
7.5000 mg | ORAL_TABLET | Freq: Three times a day (TID) | ORAL | Status: DC
  Administered 2013-02-22 – 2013-02-25 (×6): 7.5 mg via ORAL
  Filled 2013-02-22 (×10): qty 1

## 2013-02-22 MED ORDER — ACETAMINOPHEN 650 MG RE SUPP: 650.0000 mg | Freq: Four times a day (QID) | RECTAL | Status: DC | PRN

## 2013-02-22 MED ORDER — VITAMIN D3 25 MCG (1000 UNIT) PO TABS: 2000.0000 [IU] | ORAL_TABLET | Freq: Every day | ORAL | Status: DC

## 2013-02-22 MED ORDER — CALCIUM CARBONATE 1250 (500 CA) MG PO TABS
1.0000 | ORAL_TABLET | Freq: Every day | ORAL | Status: DC
  Administered 2013-02-23 – 2013-02-28 (×5): 500 mg via ORAL
  Filled 2013-02-22 (×7): qty 1

## 2013-02-22 NOTE — ED Notes (Signed)
Patient is resting.  Ortho MD at bedside.

## 2013-02-22 NOTE — ED Notes (Signed)
Spoke with Dr. Katrinka BlazingSmith secretary, was told pt ok to eat today, surgery not scheduled until Thursday.

## 2013-02-22 NOTE — ED Notes (Signed)
Per [facility], pt recently had surgery on 02/16/2013 on her left wrist and had her left shoulder relocated. Pt was sent for check up on 1/19 and was told will need to follow up tomorrow for possible surgery on left shoulder. Pt sent to ED from nursing home due to increasing pain and swelling in pt left arm. Family reports they want to consult with surgeon to possibly move surgery up. Pt received 2 Norco this am at 0930.

## 2013-02-22 NOTE — ED Notes (Signed)
Patient was eating a sandwich and ate 95% of it said she did not want anymore.

## 2013-02-22 NOTE — H&P (Signed)
History and Physical       Hospital Admission Note Date: 02/22/2013  Patient name: Marisa Smith Medical record number: [id number] Date of birth: 1924-02-15 Age: 80 y.o. Gender: female  PCP: Rene Paci, MD    Chief Complaint:  Left shoulder pain  HPI: Patient is 80 year old female with history of anxiety, depression, hypertension, hypothyroidism, osteoporosis who was recently discharged to SNF on 02/18/13 s/p ORIF of left distal radial fracture. She had closed manipulation of the left shoulder by Dr. Orlan Leavens under anesthesia on 02/17/13. She had a fall 6 days ago when she sustained fractured proximal humerus and open distal radius fracture. During the follow up appointment with Dr. Orlan Leavens, it was determined that the proximal humerus fracture is displaced and will need a surgical fixation. Patient was a scheduled to have appointment with Dr. Ave Filter on 02/23/13 and surgery scheduled for 02/24/13. However per patient and her family the left shoulder pain was not well controlled with oral medications. They brought her back to the ER for possible earlier surgery and better pain control. Patient was seen by Dr. Ave Filter in the ER and will decide surgery after discussing with family.   Review of Systems:  Constitutional: Denies fever, chills, diaphoresis, poor appetite and fatigue.  HEENT: Denies photophobia, eye pain, redness, hearing loss, ear pain, congestion, sore throat, rhinorrhea, sneezing, mouth sores, trouble swallowing, neck pain, neck stiffness and tinnitus.   Respiratory: Denies SOB, DOE, cough, chest tightness,  and wheezing.   Cardiovascular: Denies chest pain, palpitations and leg swelling.  Gastrointestinal: Denies nausea, vomiting, abdominal pain, diarrhea, constipation, blood in stool and abdominal distention.  Genitourinary: Denies dysuria, urgency, frequency, hematuria, flank pain and difficulty urinating.  Musculoskeletal:  see HPI  Skin: Denies pallor, rash and wound.  Neurological: Denies dizziness, seizures, syncope, weakness, light-headedness, numbness and headaches.  Hematological: Denies adenopathy. Easy bruising, personal or family bleeding history  Psychiatric/Behavioral: Denies suicidal ideation, mood changes, confusion, nervousness, sleep disturbance and agitation  Past Medical History: Past Medical History  Diagnosis Date  . Unspecified vitamin D deficiency   . Headache(784.0) 02/15/2010  . ALLERGIC RHINITIS CAUSE UNSPECIFIED   . ANXIETY   . DEPRESSION, MAJOR, MODERATE   . HYPERTENSION, BENIGN ESSENTIAL   . HYPOTHYROIDISM   . OSTEOPOROSIS   . Autoimmune hepatitis     on MP6, prev pred   Past Surgical History  Procedure Laterality Date  . Cataract extraction  2008    Left eye  . Cataract extraction  2009    Right eye  . Tonsillectomy  1945  . Open reduction internal fixation (orif) distal radial fracture Left 02/16/2013    Procedure: OPEN REDUCTION INTERNAL FIXATION (ORIF) DISTAL RADIAL FRACTURE;  Surgeon: Sharma Covert, MD;  Location: [facility] OR;  Service: Orthopedics;  Laterality: Left;    Medications: Prior to Admission medications   Medication Sig Start Date End Date Taking? Authorizing Provider  aspirin EC 81 MG EC tablet Take 81 mg by mouth daily.    Yes Historical Provider, MD  busPIRone (BUSPAR) 7.5 MG tablet Take 7.5 mg by mouth 2 (two) times daily.   Yes Historical Provider, MD  calcium gluconate 500 MG tablet Take 500 mg by mouth daily.     Yes Historical Provider, MD  Cholecalciferol (VITAMIN D3) 2000 UNITS TABS Take 2,000 Units by mouth daily.     Yes Historical Provider, MD  feeding supplement, ENSURE COMPLETE, (ENSURE COMPLETE) LIQD Take 237 mLs by mouth 2 (two) times daily between meals. 02/18/13  Yes Edsel Petrin,  DO  HYDROcodone-acetaminophen (NORCO/VICODIN) 5-325 MG per tablet Take 1-2 tablets by mouth every 6 (six) hours as needed for moderate pain or severe pain. 1  tablet for mild to moderate pain; 2 tablets for severe pain   Yes Historical Provider, MD  levothyroxine (SYNTHROID, LEVOTHROID) 50 MCG tablet Take 50 mcg by mouth daily before breakfast.   Yes Historical Provider, MD  LORazepam (ATIVAN) 0.5 MG tablet Take 1 tablet (0.5 mg total) by mouth 2 (two) times daily. 02/18/13  Yes Maryann Mikhail, DO  mercaptopurine (PURINETHOL) 50 MG tablet Take 25 mg by mouth daily. Give on an empty stomach 1 hour before or 2 hours after meals. Caution: Chemotherapy.   Yes Historical Provider, MD  mirtazapine (REMERON) 45 MG tablet Take 1 tablet (45 mg total) by mouth at bedtime. 02/06/12  Yes Newt LukesValerie A Leschber, MD  olmesartan (BENICAR) 40 MG tablet Take 40 mg by mouth daily.   Yes Historical Provider, MD  phenelzine (NARDIL) 15 MG tablet Take 7.5 mg by mouth 3 (three) times daily.   Yes Historical Provider, MD    Allergies:   Allergies  Allergen Reactions  . Amlodipine Besylate     REACTION: itching  . Azatadine     Per MAR?  [name] Kitchen. Azathioprine     REACTION: nausea  . Penicillins     REACTION: yeast infection  . Sertraline Hcl     REACTION: rash  . Sulfonamide Derivatives     REACTION: swelling and itching    Social History:  reports that she has never smoked. She has never used smokeless tobacco. She reports that she does not drink alcohol or use illicit drugs.  Family History: Family History  Problem Relation Age of Onset  . Hypertension Mother   . Stroke Mother   . Heart disease Father   . Stroke Sister     Physical Exam: Blood pressure 111/66, pulse 89, temperature 98.4 F (36.9 C), temperature source Oral, resp. rate 16, SpO2 100.00%. General: Alert, awake, oriented x3, in no acute distress. HEENT: normocephalic, atraumatic, anicteric sclera, pink conjunctiva, pupils equal and reactive to light and accomodation, oropharynx clear Neck: supple, no masses or lymphadenopathy, no goiter, no bruits  Heart: Regular rate and rhythm, without murmurs,  rubs or gallops. Lungs: Clear to auscultation bilaterally, no wheezing, rales or rhonchi. Abdomen: Soft, nontender, nondistended, positive bowel sounds, no masses. Extremities: No clubbing, cyanosis or edema with positive pedal pulses. Neuro: Grossly intact, no focal neurological deficits, strength 5/5 lower extremities bilaterally, left arm in sling.  Psych: alert and oriented x 3, normal mood and affect Skin: no rashes or lesions, warm and dry   LABS on Admission:  Basic Metabolic Panel:  Recent Labs Lab 02/18/13 0347 02/22/13 1310  NA 129* 133*  K 3.9 4.9  CL 94* 96  CO2 23 27  GLUCOSE 124* 117*  BUN 21 22  CREATININE 0.62 0.67  CALCIUM 8.1* 8.6   Liver Function Tests: No results found for this basename: AST, ALT, ALKPHOS, BILITOT, PROT, ALBUMIN,  in the last 168 hours No results found for this basename: LIPASE, AMYLASE,  in the last 168 hours  Recent Labs Lab 02/16/13 2226  AMMONIA 31   CBC:  Recent Labs Lab 02/18/13 0347 02/22/13 1310  WBC 5.6 7.8  NEUTROABS  --  5.6  HGB 9.0* 9.2*  HCT 25.9* 27.0*  MCV 97.7 100.0  PLT 132* 252   Cardiac Enzymes: No results found for this basename: CKTOTAL, CKMB, CKMBINDEX, TROPONINI,  in the  last 168 hours BNP: No components found with this basename: POCBNP,  CBG: No results found for this basename: GLUCAP,  in the last 168 hours   Radiological Exams on Admission: Dg Wrist Complete Left  02/16/2013   CLINICAL DATA:  Fall, bruising and swelling left wrist  EXAM: LEFT WRIST - COMPLETE 3+ VIEW  COMPARISON:  None  FINDINGS: Osseous demineralization.  Comminuted displaced distal left radial metaphyseal fracture with apex volar angulation.  Intra-articular extension at the radiocarpal joint and question the distal radioulnar joint.  Secondary ulnar plus variance.  Degenerative changes at distal pole scaphoid.  Associated soft tissue swelling at wrist.  Questionable tiny avulsion fracture at tip of ulnar styloid process.  No  additional fracture, dislocation or bone destruction.  Scattered vascular calcifications.  IMPRESSION: Comminuted displaced and angulated distal left radial metaphyseal fracture with intra-articular extension at the radiocarpal and question distal radioulnar joints.  Suspect tiny avulsion fracture tip of ulnar styloid.   Electronically Signed   By: Ulyses Southward M.D.   On: 02/16/2013 10:20   Ct Head Wo Contrast  02/16/2013   CLINICAL DATA:  Status post fall, but able to control movements  EXAM: CT HEAD WITHOUT CONTRAST  CT CERVICAL SPINE WITHOUT CONTRAST  TECHNIQUE: Multidetector CT imaging of the head and cervical spine was performed following the standard protocol without intravenous contrast. Multiplanar CT image reconstructions of the cervical spine were also generated.  COMPARISON:  MRI the brain of November 24, 2011 and CT scan of the brain of November 23, 2011.  FINDINGS: CT HEAD FINDINGS  There is mild diffuse cerebral and cerebellar atrophy with compensatory ventriculomegaly which has progressed slightly since the previous CT scan. There is decreased density in the deep white matter of both cerebral hemispheres consistent with chronic small vessel ischemic type change. There is no acute intracranial hemorrhage nor evidence of an evolving ischemic infarction. The cerebellum and brainstem exhibit no acute abnormalities.  At bone window settings the observed portions of the paranasal sinuses and right mastoid air cells are clear. On the left there is some fluid within the mastoid air cells. There is no evidence of an acute skull fracture.  CT CERVICAL SPINE FINDINGS  The cervical vertebral bodies are preserved in height. There is disc space narrowing at C4-5 and to a lesser extent at C5-6 and C6-7. There are posterior endplate osteophytes at these levels. The prevertebral soft tissue spaces appear normal. There is no evidence of a perched facet or facet or spinous process fracture. The odontoid is intact and  the lateral masses of C1 align normally with those of C2. The bony ring at each cervical level is intact. There is facet joint degenerative change at multiple levels.  The soft tissues of the neck exhibit no evidence of hematomas. The pulmonary apices exhibit fibrotic change but no pneumothorax. The observed portions of the first and second ribs appear intact.  IMPRESSION: 1. There is no evidence of an acute intracranial hemorrhage nor of an acute ischemic infarction. 2. There are age related white matter hypodensities consistent with chronic small vessel ischemic change. 3. There is no evidence of an acute skull fracture. There is chronic opacification within left mastoid air cells which may reflect inflammation. 4. There are degenerative changes of the cervical spine but there is no evidence of an acute fracture nor dislocation.   Electronically Signed   By: David  [place]   On: 02/16/2013 11:40   Ct Cervical Spine Wo Contrast  02/16/2013  CLINICAL DATA:  Status post fall, but able to control movements  EXAM: CT HEAD WITHOUT CONTRAST  CT CERVICAL SPINE WITHOUT CONTRAST  TECHNIQUE: Multidetector CT imaging of the head and cervical spine was performed following the standard protocol without intravenous contrast. Multiplanar CT image reconstructions of the cervical spine were also generated.  COMPARISON:  MRI the brain of November 24, 2011 and CT scan of the brain of November 23, 2011.  FINDINGS: CT HEAD FINDINGS  There is mild diffuse cerebral and cerebellar atrophy with compensatory ventriculomegaly which has progressed slightly since the previous CT scan. There is decreased density in the deep white matter of both cerebral hemispheres consistent with chronic small vessel ischemic type change. There is no acute intracranial hemorrhage nor evidence of an evolving ischemic infarction. The cerebellum and brainstem exhibit no acute abnormalities.  At bone window settings the observed portions of the paranasal sinuses  and right mastoid air cells are clear. On the left there is some fluid within the mastoid air cells. There is no evidence of an acute skull fracture.  CT CERVICAL SPINE FINDINGS  The cervical vertebral bodies are preserved in height. There is disc space narrowing at C4-5 and to a lesser extent at C5-6 and C6-7. There are posterior endplate osteophytes at these levels. The prevertebral soft tissue spaces appear normal. There is no evidence of a perched facet or facet or spinous process fracture. The odontoid is intact and the lateral masses of C1 align normally with those of C2. The bony ring at each cervical level is intact. There is facet joint degenerative change at multiple levels.  The soft tissues of the neck exhibit no evidence of hematomas. The pulmonary apices exhibit fibrotic change but no pneumothorax. The observed portions of the first and second ribs appear intact.  IMPRESSION: 1. There is no evidence of an acute intracranial hemorrhage nor of an acute ischemic infarction. 2. There are age related white matter hypodensities consistent with chronic small vessel ischemic change. 3. There is no evidence of an acute skull fracture. There is chronic opacification within left mastoid air cells which may reflect inflammation. 4. There are degenerative changes of the cervical spine but there is no evidence of an acute fracture nor dislocation.   Electronically Signed   By: David  [place]   On: 02/16/2013 11:40   Dg Chest Portable 1 View  02/16/2013   CLINICAL DATA:  Fall  EXAM: PORTABLE CHEST - 1 VIEW  COMPARISON:  None.  FINDINGS: Cardiomediastinal silhouette is unremarkable. Hyperinflation. Probable chronic mild interstitial prominence. No acute infiltrate or pulmonary edema.  IMPRESSION: Hyperinflation. No active disease. Probable chronic mild interstitial prominence.   Electronically Signed   By: Natasha Mead M.D.   On: 02/16/2013 13:14   Dg Shoulder Left  02/22/2013   CLINICAL DATA:  Larey Seat a few days ago.   Left shoulder pain.  EXAM: LEFT SHOULDER - 2+ VIEW  COMPARISON:  02/16/2013  FINDINGS: The displaced fracture of the proximal humeral metaphysis is again noted. The fractures transverse with minimal comminution. The shaft fracture fragment has displaced anteriorly and medially by full shaft width. It is less foreshortened than it was on the prior study, with approximately 5 mm of overlap. There is also less rotation between the humeral head and shaft, with no significant varus or valgus angulation persisting.  There is no new fracture.  There is no dislocation.  IMPRESSION: Fracture of the proximal left humerus as described, still displaced, but with less foreshortening and minimal  residual angulation. No new abnormality.   Electronically Signed   By: Amie Portland M.D.   On: 02/22/2013 14:30   Dg Shoulder Left  02/16/2013   CLINICAL DATA:  Fall.  EXAM: LEFT SHOULDER - 2+ VIEW  COMPARISON:  None.  FINDINGS: Angulated fracture of the proximal left humerus at the diaphyseal metaphyseal junction is normal. Comminution is present. Prominent soft tissue swelling is present . No other focal abnormality identified  IMPRESSION: Angulated comminuted fracture of proximal left humerus.   Electronically Signed   By: Maisie Fus  Register   On: 02/16/2013 10:20    Assessment/Plan Principal Problem:   Proximal left humerus fracture - Will admit patient for pain control, IV n.p.o. with antiemetics, gentle hydration - Keep n.p.o. until decision for surgery by orthopedics - If surgery is still planned on 02/24/13, will place patient on diet and make n.p.o. after midnight tomorrow - Hold heparin subcutaneous or Lovenox subcutaneous until decision for surgery, placed on SCDs  Active Problems:   HYPOTHYROIDISM - Continue Synthroid    Unspecified vitamin D deficiency with osteoporosis - Continue vitamin D replacement    HYPERTENSION, BENIGN ESSENTIAL - Currently stable, continue Avapro  Left  Radius fracture - Status  post ORIF left distal radial fracture   chronic hyponatremia - Monitor BMET closely   Anemia - Hemoglobin for now stable, transfuse if less than 8  DVT prophylaxis: SCDs for now   CODE STATUS: Full CODE STATUS   Family Communication: Admission, patients condition and plan of care including tests being ordered have been discussed with the patient who indicates understanding and agree with the plan and Code Status   Further plan will depend as patient's clinical course evolves and further radiologic and laboratory data become available.   Time Spent on Admission: 1 hour   Aleksandar Duve M.D. [facility] 02/22/2013, 3:59 PM Pager: [phone]  If 7PM-7AM, please contact night-coverage [email or web address] Password [email or web address]

## 2013-02-22 NOTE — Consult Note (Signed)
Seen and agree with above. Plan for IMN Thursday.  Admit for pain control.

## 2013-02-22 NOTE — Consult Note (Signed)
Reason for Consult:uncontrolled left shoulder pain with known proximal humerus fracture Referring Physician: [name] is an 80 y.o. female.  Marisa Smith is an 80 year old female with chief complaint of left shoulder pain with known proximal humerus fracture. Patient was seen in the ED 6 days ago after a fall that occurred sustaining a  fractured proximal humerus and open distal radius fx. Distal radius fracture was repaired by Dr. Caralyn Smith and the proximal humerus was manipulated at that time under anesthesia in order to try to treat conservatively in a sling. During follow up with Dr. Caralyn Smith, it was determined that prox humerus fx was now displaced and surgical fixation was determined to be needed for best results. She was scheduled to come to our office tomorrow 02/23/13 for consultation and surgery was booked for 02/24/13 with plan for left IM humeral nail. Per her family, her left shoulder pain has not been controlled with oral medication. [facility]. She has been taking Norco for pain, unsure how often she is getting dosed. Reports her left shoulder pain is unbearable. Worse with mvmt and better with rest. Reports bruising and swelling left arm, denies distal numbness and tingling.       Past Medical History  Diagnosis Date  . Unspecified vitamin D deficiency   . Headache(784.0) 02/15/2010  . ALLERGIC RHINITIS CAUSE UNSPECIFIED   . ANXIETY   . DEPRESSION, MAJOR, MODERATE   . HYPERTENSION, BENIGN ESSENTIAL   . HYPOTHYROIDISM   . OSTEOPOROSIS   . Autoimmune hepatitis     on MP6, prev pred    Past Surgical History  Procedure Laterality Date  . Cataract extraction  2008    Left eye  . Cataract extraction  2009    Right eye  . Tonsillectomy  1945  . Open reduction internal fixation (orif) distal radial fracture Left 02/16/2013    Procedure: OPEN REDUCTION INTERNAL FIXATION (ORIF) DISTAL RADIAL FRACTURE;  Surgeon: Marisa Hoff, MD;  Location: [facility];   Service: Orthopedics;  Laterality: Left;    Family History  Problem Relation Age of Onset  . Hypertension Mother   . Stroke Mother   . Heart disease Father   . Stroke Sister     Social History:  reports that she has never smoked. She has never used smokeless tobacco. She reports that she does not drink alcohol or use illicit drugs.  Allergies:  Allergies  Allergen Reactions  . Amlodipine Besylate     REACTION: itching  . Azatadine     Per MAR?  [name] Kitchen Azathioprine     REACTION: nausea  . Penicillins     REACTION: yeast infection  . Sertraline Hcl     REACTION: rash  . Sulfonamide Derivatives     REACTION: swelling and itching    Medications: I have reviewed the patient's current medications.  Results for orders placed during the hospital encounter of 02/22/13 (from the past 48 hour(s))  CBC WITH DIFFERENTIAL     Status: Abnormal   Collection Time    02/22/13  1:10 PM      Result Value Range   WBC 7.8  4.0 - 10.5 K/uL   RBC 2.70 (*) 3.87 - 5.11 MIL/uL   Hemoglobin 9.2 (*) 12.0 - 15.0 g/dL   HCT 27.0 (*) 36.0 - 46.0 %   MCV 100.0  78.0 - 100.0 fL   MCH 34.1 (*) 26.0 - 34.0 pg   MCHC 34.1  30.0 - 36.0  g/dL   RDW 13.9  11.5 - 15.5 %   Platelets 252  150 - 400 K/uL   Neutrophils Relative % 72  43 - 77 %   Neutro Abs 5.6  1.7 - 7.7 K/uL   Lymphocytes Relative 12  12 - 46 %   Lymphs Abs 0.9  0.7 - 4.0 K/uL   Monocytes Relative 16 (*) 3 - 12 %   Monocytes Absolute 1.2 (*) 0.1 - 1.0 K/uL   Eosinophils Relative 1  0 - 5 %   Eosinophils Absolute 0.1  0.0 - 0.7 K/uL   Basophils Relative 0  0 - 1 %   Basophils Absolute 0.0  0.0 - 0.1 K/uL  BASIC METABOLIC PANEL     Status: Abnormal   Collection Time    02/22/13  1:10 PM      Result Value Range   Sodium 133 (*) 137 - 147 mEq/L   Potassium 4.9  3.7 - 5.3 mEq/L   Chloride 96  96 - 112 mEq/L   CO2 27  19 - 32 mEq/L   Glucose, Bld 117 (*) 70 - 99 mg/dL   BUN 22  6 - 23 mg/dL   Creatinine, Ser 0.67  0.50 - 1.10 mg/dL    Calcium 8.6  8.4 - 10.5 mg/dL   GFR calc non Af Amer 77 (*) >90 mL/min   GFR calc Af Amer 90 (*) >90 mL/min   Comment: (NOTE)     The eGFR has been calculated using the CKD EPI equation.     This calculation has not been validated in all clinical situations.     eGFR's persistently <90 mL/min signify possible Chronic Kidney     Disease.    Dg Shoulder Left  02/22/2013   CLINICAL DATA:  [place] a few days ago.  Left shoulder pain.  EXAM: LEFT SHOULDER - 2+ VIEW  COMPARISON:  02/16/2013  FINDINGS: The displaced fracture of the proximal humeral metaphysis is again noted. The fractures transverse with minimal comminution. The shaft fracture fragment has displaced anteriorly and medially by full shaft width. It is less foreshortened than it was on the prior study, with approximately 5 mm of overlap. There is also less rotation between the humeral head and shaft, with no significant varus or valgus angulation persisting.  There is no new fracture.  There is no dislocation.  IMPRESSION: Fracture of the proximal left humerus as described, still displaced, but with less foreshortening and minimal residual angulation. No new abnormality.   Electronically Signed   By: Lajean Manes M.D.   On: 02/22/2013 14:30    Review of Systems  Musculoskeletal: Positive for joint pain.  All other systems reviewed and are negative.   Blood pressure 111/66, pulse 89, temperature 98.4 F (36.9 C), temperature source Oral, resp. rate 16, SpO2 100.00%. Physical Exam  Constitutional: She is oriented to person, place, and time. She appears well-developed and well-nourished.  HENT:  Head: Normocephalic and atraumatic.  Eyes: EOM are normal.  Neck: Normal range of motion.  Cardiovascular: Intact distal pulses.   Musculoskeletal:  L UE with significant ecchymosis and mod distal swelling TTP left prox humerus at fracture site Wiggles fingers Distally NVI   Neurological: She is alert and oriented to person, place, and  time.  Skin: Skin is warm and dry.  Psychiatric: She has a normal mood and affect.    Assessment/Plan: Left displaced proximal humerus fracture, pain uncontrolled Plan for hospitalist team to admit inpatient for pain control  Otherwise continue treatment as planned- sling, plan for L IM humeral nail 02/24/13, okay to ice extremity Will continue to follow  Cloud Graham 02/22/2013, 3:38 PM

## 2013-02-22 NOTE — ED Provider Notes (Signed)
CSN: [id number]     Arrival date & time 02/22/13  1203 History   First MD Initiated Contact with Patient 02/22/13 1214     Chief Complaint  Patient presents with  . Post-op Problem   (Consider location/radiation/quality/duration/timing/severity/associated sxs/prior Treatment) HPI Comments: Patient is an 80 year old female with a past medical history hypertension, osteoporosis and anxiety who presents with left shoulder pain for the past 6 days. Patient was seen in the ED 6 days ago after a fall that occurred where the patient fractured her proximal humerus and distal radius. Patient's distal radius fracture was repaired by Dr. Melvyn Novas and her shoulder was manipulated to realign the bone during her wrist surgery. Patient's family brought her to the ED due to worsening pain that is unable to be controlled at [facility]. I reviewed the patient's medication schedule and the patient does not appear to be getting her pain medication every 4 hours as recommended, which could likely be a source of her pain. Patient's family is demanding her to have surgery today because "it is inhumane the she had to wait this long." Patient's family states that she saw Dr. Melvyn Novas yesterday in the office and he imaged her shoulder which showed it was "out of place." I do not have access to this image. Patient has surgery scheduled in 2 days with Dr. Ave Filter. Patient denies any new injury.    Past Medical History  Diagnosis Date  . Unspecified vitamin D deficiency   . Headache(784.0) 02/15/2010  . ALLERGIC RHINITIS CAUSE UNSPECIFIED   . ANXIETY   . DEPRESSION, MAJOR, MODERATE   . HYPERTENSION, BENIGN ESSENTIAL   . HYPOTHYROIDISM   . OSTEOPOROSIS   . Autoimmune hepatitis     on MP6, prev pred   Past Surgical History  Procedure Laterality Date  . Cataract extraction  2008    Left eye  . Cataract extraction  2009    Right eye  . Tonsillectomy  1945  . Open reduction internal fixation (orif) distal radial fracture  Left 02/16/2013    Procedure: OPEN REDUCTION INTERNAL FIXATION (ORIF) DISTAL RADIAL FRACTURE;  Surgeon: Sharma Covert, MD;  Location: [facility];  Service: Orthopedics;  Laterality: Left;   Family History  Problem Relation Age of Onset  . Hypertension Mother   . Stroke Mother   . Heart disease Father   . Stroke Sister    History  Substance Use Topics  . Smoking status: Never Smoker   . Smokeless tobacco: Never Used     Comment: Married, Retired- daughter is Okey Regal Morden-Fifield responsible for majority of supervison for parents  . Alcohol Use: No   OB History   Grav Para Term Preterm Abortions TAB SAB Ect Mult Living                 Review of Systems  Constitutional: Negative for fever, chills and fatigue.  HENT: Negative for trouble swallowing.   Eyes: Negative for visual disturbance.  Respiratory: Negative for shortness of breath.   Cardiovascular: Negative for chest pain and palpitations.  Gastrointestinal: Negative for nausea, vomiting, abdominal pain and diarrhea.  Genitourinary: Negative for dysuria and difficulty urinating.  Musculoskeletal: Positive for arthralgias and joint swelling. Negative for neck pain.  Skin: Negative for color change.  Neurological: Negative for dizziness and weakness.  Psychiatric/Behavioral: Negative for dysphoric mood.    Allergies  Amlodipine besylate; Azatadine; Azathioprine; Penicillins; Sertraline hcl; and Sulfonamide derivatives  Home Medications   Current Outpatient Rx  Name  Route  Sig  Dispense  Refill  . aspirin EC 81 MG EC tablet   Oral   Take 81 mg by mouth daily.          . busPIRone (BUSPAR) 7.5 MG tablet   Oral   Take 7.5 mg by mouth 2 (two) times daily.         . calcium gluconate 500 MG tablet   Oral   Take 500 mg by mouth daily.           . Cholecalciferol (VITAMIN D3) 2000 UNITS TABS   Oral   Take 2,000 Units by mouth daily.           . feeding supplement, ENSURE COMPLETE, (ENSURE COMPLETE) LIQD    Oral   Take 237 mLs by mouth 2 (two) times daily between meals.         [name] Kitchen HYDROcodone-acetaminophen (NORCO/VICODIN) 5-325 MG per tablet   Oral   Take 1-2 tablets by mouth every 6 (six) hours as needed for moderate pain or severe pain. 1 tablet for mild to moderate pain; 2 tablets for severe pain         . levothyroxine (SYNTHROID, LEVOTHROID) 50 MCG tablet   Oral   Take 50 mcg by mouth daily before breakfast.         . LORazepam (ATIVAN) 0.5 MG tablet   Oral   Take 1 tablet (0.5 mg total) by mouth 2 (two) times daily.   30 tablet   0   . mercaptopurine (PURINETHOL) 50 MG tablet   Oral   Take 25 mg by mouth daily. Give on an empty stomach 1 hour before or 2 hours after meals. Caution: Chemotherapy.         . mirtazapine (REMERON) 45 MG tablet   Oral   Take 1 tablet (45 mg total) by mouth at bedtime.   30 tablet   3   . olmesartan (BENICAR) 40 MG tablet   Oral   Take 40 mg by mouth daily.         . phenelzine (NARDIL) 15 MG tablet   Oral   Take 7.5 mg by mouth 3 (three) times daily.          BP 108/51  Pulse 93  Temp(Src) 98.3 F (36.8 C) (Oral)  Resp 20  SpO2 97% Physical Exam  Nursing note and vitals reviewed. Constitutional: She is oriented to person, place, and time. She appears well-developed and well-nourished. No distress.  HENT:  Head: Normocephalic and atraumatic.  Eyes: Conjunctivae and EOM are normal.  Neck: Normal range of motion.  Cardiovascular: Normal rate and regular rhythm.  Exam reveals no gallop and no friction rub.   No murmur heard. Pulmonary/Chest: Effort normal and breath sounds normal. She has no wheezes. She has no rales. She exhibits no tenderness.  Abdominal: Soft. She exhibits no distension. There is no tenderness. There is no rebound.  Musculoskeletal:  Left shoulder yellow discoloring due to bruising. Limited ROM due to pain and swelling. Generalized edema and tenderness to palpation. Patient's left shoulder in sling.    Left wrist in cast from previous surgery.   Neurological: She is alert and oriented to person, place, and time. Coordination normal.  Sensation intact distal to injury of left arm. Speech is goal-oriented.    Skin: Skin is warm and dry.  Psychiatric: She has a normal mood and affect. Her behavior is normal.    ED Course  Procedures (including critical care time) Labs Review Labs  Reviewed  CBC WITH DIFFERENTIAL - Abnormal; Notable for the following:    RBC 2.70 (*)    Hemoglobin 9.2 (*)    HCT 27.0 (*)    MCH 34.1 (*)    Monocytes Relative 16 (*)    Monocytes Absolute 1.2 (*)    All other components within normal limits  BASIC METABOLIC PANEL - Abnormal; Notable for the following:    Sodium 133 (*)    Glucose, Bld 117 (*)    GFR calc non Af Amer 77 (*)    GFR calc Af Amer 90 (*)    All other components within normal limits   Imaging Review Dg Shoulder Left  02/22/2013   CLINICAL DATA:  [place] a few days ago.  Left shoulder pain.  EXAM: LEFT SHOULDER - 2+ VIEW  COMPARISON:  02/16/2013  FINDINGS: The displaced fracture of the proximal humeral metaphysis is again noted. The fractures transverse with minimal comminution. The shaft fracture fragment has displaced anteriorly and medially by full shaft width. It is less foreshortened than it was on the prior study, with approximately 5 mm of overlap. There is also less rotation between the humeral head and shaft, with no significant varus or valgus angulation persisting.  There is no new fracture.  There is no dislocation.  IMPRESSION: Fracture of the proximal left humerus as described, still displaced, but with less foreshortening and minimal residual angulation. No new abnormality.   Electronically Signed   By: Amie Portlandavid  Ormond M.D.   On: 02/22/2013 14:30    EKG Interpretation   None       MDM   1. Proximal humerus fracture   2. Abnormal involuntary movements(781.0)   3. Anxiety state, unspecified   4. Autoimmune hepatitis   5.  Bradycardia     1:10 PM Labs pending. Patient will have left shoulder xray. Patient will have IV dilaudid and zofran for symptoms. Vitals stable and patient afebrile.   4:12 PM Patient's xray shows no acute changes to her shoulder. Patient will have hospitalist admission for pain control and Dr. Ave Filterhandler will see the patient and the family.    Emilia BeckKaitlyn Maggie Senseney, [place] JerseyPA-C 02/22/13 [id number]

## 2013-02-23 LAB — BASIC METABOLIC PANEL
BUN: 19 mg/dL (ref 6–23)
CO2: 26 meq/L (ref 19–32)
CREATININE: 0.66 mg/dL (ref 0.50–1.10)
Calcium: 8.2 mg/dL — ABNORMAL LOW (ref 8.4–10.5)
Chloride: 98 mEq/L (ref 96–112)
GFR calc Af Amer: >90 mL/min (ref >90)
GFR calc non Af Amer: 78 mL/min — ABNORMAL LOW (ref >90)
Glucose, Bld: 96 mg/dL (ref 70–99)
Potassium: 5 mEq/L (ref 3.7–5.3)
Sodium: 134 mEq/L — ABNORMAL LOW (ref 137–147)

## 2013-02-23 LAB — CBC
HEMATOCRIT: 24.7 % — AB (ref 36.0–46.0)
Hemoglobin: 8.4 g/dL — ABNORMAL LOW (ref 12.0–15.0)
MCH: 34.4 pg — AB (ref 26.0–34.0)
MCHC: 34 g/dL (ref 30.0–36.0)
MCV: 101.2 fL — AB (ref 78.0–100.0)
PLATELETS: 229 10*3/uL (ref 150–400)
RBC: 2.44 MIL/uL — ABNORMAL LOW (ref 3.87–5.11)
RDW: 13.9 % (ref 11.5–15.5)
WBC: 6.2 10*3/uL (ref 4.0–10.5)

## 2013-02-23 LAB — TSH: TSH: 3.588 u[IU]/mL (ref 0.350–4.500)

## 2013-02-23 MED ORDER — CHLORHEXIDINE GLUCONATE CLOTH 2 % EX PADS: 6.0000 | MEDICATED_PAD | Freq: Once | CUTANEOUS | Status: DC

## 2013-02-23 MED ORDER — CLINDAMYCIN PHOSPHATE 900 MG/50ML IV SOLN
900.0000 mg | INTRAVENOUS | Status: DC
  Filled 2013-02-23: qty 50

## 2013-02-23 MED ORDER — CLINDAMYCIN PHOSPHATE 900 MG/50ML IV SOLN
900.0000 mg | INTRAVENOUS | Status: AC
  Administered 2013-02-24: 900 mg via INTRAVENOUS
  Filled 2013-02-23 (×3): qty 50

## 2013-02-23 MED ORDER — CHLORHEXIDINE GLUCONATE 4 % EX LIQD
60.0000 mL | Freq: Once | CUTANEOUS | Status: AC
  Administered 2013-02-24: 4 via TOPICAL
  Filled 2013-02-23: qty 60

## 2013-02-23 NOTE — Progress Notes (Signed)
PATIENT ID: Isidore MoosAnnie Gibbard     Procedure(s) (LRB): INTRAMEDULLARY (IM) NAIL LEFT PROXIMAL  HUMERUS (Left)  Subjective: Reports feeling "not so well" this am. Reports her pain in left shoulder is controlled with oral pain medication. Reports she in general feels tired, does not volunteer other specifics.   Objective:  Filed Vitals:   02/23/13 0610  BP: 117/61  Pulse: 84  Temp: 98.2 F (36.8 C)  Resp: 16     Awake, alert, orientated. Sitting up in a chair and eating breakfast L UE in sling, short arm cast Mod swelling of fingers Ecchymosis proximally   Labs:   Recent Labs  02/22/13 1310 02/23/13 0601  HGB 9.2* 8.4*   Recent Labs  02/22/13 1310 02/23/13 0601  WBC 7.8 6.2  RBC 2.70* 2.44*  HCT 27.0* 24.7*  PLT 252 229   Recent Labs  02/22/13 1310 02/23/13 0601  NA 133* 134*  K 4.9 5.0  CL 96 98  CO2 27 26  BUN 22 19  CREATININE 0.67 0.66  GLUCOSE 117* 96  CALCIUM 8.6 8.2*    Assessment and Plan: Pain controlled with oral medication- continue pain mgmt per primary team Continue to plan for left IM humeral nail tomorrow afternoon as scheduled   VTE proph: per primary team

## 2013-02-23 NOTE — Progress Notes (Signed)
OT Cancellation Note  Patient Details Name: Marisa Smith MRN: [id number] DOB: Jan 10, 1925   Cancelled Treatment:    Reason Eval/Treat Not Completed: Other (comment) (pt scheduled for sx tomorrow). Will hold OT eval until after sx for Left humeral IM nail.    02/23/2013 Cipriano MileJohnson, Shakia Sebastiano Elizabeth OTR/L Pager [phone] Office [phone]

## 2013-02-23 NOTE — ED Provider Notes (Signed)
Medical screening examination/treatment/procedure(s) were conducted as a shared visit with non-physician practitioner(s) and myself.  I personally evaluated the patient during the encounter.  EKG Interpretation    Date/Time:    Ventricular Rate:    PR Interval:    QRS Duration:   QT Interval:    QTC Calculation:   R Axis:     Text Interpretation:              Pt with recent fall, proximal humerus fracture. Family had stated would like surgery now, and not wait for scheduled ortho follow up. Pt alert, content appearing. xr reviewed, will discussed w ortho.   Suzi RootsKevin E Satoru Milich, MD 02/23/13 [phone]

## 2013-02-23 NOTE — Progress Notes (Addendum)
Marisa Smith ZOX:[id number] DOB: 1924-07-15 DOA: 02/22/2013 PCP: Rene PaciValerie Leschber, MD  Brief narrative: 80 y/o ?, known chronic hyponatremia, prediabetes, stable hypothyroidism, autoimmune hepatitis diagnosed 2007 foll by Dr. Johnney KillianJacobs [on mercaptopurine], recent complex Orthopedic h/o wherein patient fell, was found on the floor and underwent ORIF for L distal radial # + closed manipulation by Dr. Orlan Leavensrtman.  Patient re-admitted from nursing facility by Dr. Kevan NyGates as it was determined that Prox humerus was displaced and would need surgical fixation-surgery was scheduled for 1/22 however because patient was in intense pain, patient was brought to the emergency room 1/20 and admitted for pain control by hospitalist service, with a plan to have a definitive fixation 02/24/13  I have discussed with Dr. Ave Filterhandler that once patient goes to surgery, patient will be on orthopedic service  Past medical history-As per Problem list Chart reviewed as below-   Consultants:  Orthopedics-Dr. Ave Filterhandler  Procedures:    Antibiotics:  None currently   Subjective  Doing  Well Tolerated lunch No nausea no vomiting Pain minimal    Objective    Interim History: None  Telemetry: None   Objective: Filed Vitals:   02/22/13 1447 02/22/13 1530 02/22/13 2218 02/23/13 0610  BP: 105/66 111/66 104/48 117/61  Pulse: 87 89 85 84  Temp: 98.4 F (36.9 C)  98.5 F (36.9 C) 98.2 F (36.8 C)  TempSrc: Oral  Oral Oral  Resp: 16  16 16   SpO2: 96% 100% 99% 99%    Intake/Output Summary (Last 24 hours) at 02/23/13 1316 Last data filed at 02/23/13 0612  Gross per 24 hour  Intake    360 ml  Output      0 ml  Net    360 ml    Exam:  General: EOMI, NCAT Cardiovascular: S1-S2 no murmur rub or gallop  Respiratory: Clinically clear Abdomen: Soft nontender nondistended Skin left arm in a sling. Did not assess range of motion Neuro stable, moving all 4 extremities other than left upper extremity, pulses  present  Data Reviewed: Basic Metabolic Panel:  Recent Labs Lab 02/16/13 1534  02/17/13 0250 02/18/13 0347 02/22/13 1310 02/23/13 0601  NA  --   --  131* 129* 133* 134*  K  --   < > 3.8 3.9 4.9 5.0  CL  --   --  94* 94* 96 98  CO2  --   --  23 23 27 26   GLUCOSE  --   --  103* 124* 117* 96  BUN  --   --  19 21 22 19   CREATININE 0.65  --  0.73 0.62 0.67 0.66  CALCIUM  --   --  8.0* 8.1* 8.6 8.2*  < > = values in this interval not displayed. Liver Function Tests: No results found for this basename: AST, ALT, ALKPHOS, BILITOT, PROT, ALBUMIN,  in the last 168 hours No results found for this basename: LIPASE, AMYLASE,  in the last 168 hours  Recent Labs Lab 02/16/13 2226  AMMONIA 31   CBC:  Recent Labs Lab 02/16/13 1534 02/17/13 0250 02/18/13 0347 02/22/13 1310 02/23/13 0601  WBC 5.6 5.0 5.6 7.8 6.2  NEUTROABS  --   --   --  5.6  --   HGB 10.9* 9.3* 9.0* 9.2* 8.4*  HCT 30.5* 26.9* 25.9* 27.0* 24.7*  MCV 96.5 98.5 97.7 100.0 101.2*  PLT 144* 126* 132* 252 229   Cardiac Enzymes: No results found for this basename: CKTOTAL, CKMB, CKMBINDEX, TROPONINI,  in the  last 168 hours BNP: No components found with this basename: POCBNP,  CBG: No results found for this basename: GLUCAP,  in the last 168 hours  Recent Results (from the past 240 hour(s))  MRSA PCR SCREENING     Status: None   Collection Time    02/16/13  5:24 PM      Result Value Range Status   MRSA by PCR NEGATIVE  NEGATIVE Final   Comment:            The GeneXpert MRSA Assay (FDA     approved for NASAL specimens     only), is one component of a     comprehensive MRSA colonization     surveillance program. It is not     intended to diagnose MRSA     infection nor to guide or     monitor treatment for     MRSA infections.     Studies:              All Imaging reviewed and is as per above notation   Scheduled Meds: . aspirin EC  81 mg Oral Daily  . busPIRone  7.5 mg Oral BID  . calcium carbonate   1 tablet Oral Q breakfast  . cholecalciferol  2,000 Units Oral Daily  . feeding supplement (ENSURE COMPLETE)  237 mL Oral BID BM  . irbesartan  300 mg Oral Daily  . levothyroxine  50 mcg Oral QAC breakfast  . LORazepam  0.5 mg Oral BID  . mercaptopurine  25 mg Oral QAC breakfast  . mirtazapine  45 mg Oral QHS  . phenelzine  7.5 mg Oral TID   Continuous Infusions: . sodium chloride       Assessment/Plan: 1. Proximal left humerus fracture-as per orthopedic surgery.  As discussed on 1/21 with Dr. Ave Filter, I am happy to stay on board as a consultant if assistance is needed with her stable medical issues. I will see her in the morning tomorrow, and perioperatively if needed.  Patient to be placed on orthopedic service postoperatively per my discussion with Dr. Ave Filter. 2. Hypothyroidism-continue Synthroid 50 mcg. TSH therapeutic. 3. Hypertension-continue irbesartan 300 mg daily.  Watch potassium.  Cut back dose to 150 if potassium above 5.2 4. Borderline anemia, macrocytic vs dilutional-type and screened, monitor 5. Mild hyponatremia, euvolemic-continue saline, cut back rate to 50 cc an hour 6. Falls-potentially related to Ortho stasis.   Phenelzine 7.5 tid-has an additive theoretical  effect with ARB.  Will recommend on discharge that nursing home physician coordinate with psychiatrist to slowly taper off this medication given risks for falls in the future. 7. Autoimmune hepatitis-continue mercaptopurine 25 mg every morning.  Outpatient followup needed with Dr. Christella Hartigan of GI 8. Depression-some  therapeutic duplication with buspar 7.5 twice a day, Nardil 7.5 3 times a day, mirtazapine 45 mg each bedtime, lorazepam 0.5 twice a day-will need to exercise caution with pain medications as this can potentiate the effects of this and cause confusion 9. Potential TIA in the past?-could have also been polypharmacy. For now continue aspirin 81 daily  Code Status: Full Family Communication: Discussed  plan of care with patient, patient's daughter as well as orthopedic surgeon Disposition Plan: Inpatient pending surgery   Pleas Koch, MD  [facility] Pager [phone] 02/23/2013, 1:16 PM    LOS: 1 day

## 2013-02-23 NOTE — Evaluation (Signed)
Physical Therapy Evaluation Patient Details Name: Marisa Smith MRN: [id number] DOB: 1924-05-20 Today's Date: 02/23/2013 Time: 4098-1191 PT Time Calculation (min): 15 min  PT Assessment / Plan / Recommendation History of Present Illness  Patient is 80 year old female with history of anxiety, depression, hypertension, hypothyroidism, osteoporosis who was recently discharged to SNF on 02/18/13 s/p ORIF of left distal radial fracture. She had closed manipulation of the left shoulder by Dr. Orlan Leavens under anesthesia on 02/17/13. She had a fall 6 days ago when she sustained fractured proximal humerus and open distal radius fracture. During the follow up appointment with Dr. Orlan Leavens, it was determined that the proximal humerus fracture is displaced and will need a surgical fixation. Patient was a scheduled to have appointment with Dr. Ave Filter on 02/23/13 and surgery scheduled for 02/24/13. However per patient and her family the left shoulder pain was not well controlled with oral medications. They brought her back to the ER for possible earlier surgery and better pain control. Pt planned for OR tomororw.   Clinical Impression  Pt adm due to the above. Presents with limitations in functional mobility secondary to deficits indicated below (see PT problem lis). Pt has sustained multiple recent falls. Pt is a high fall risk. Will benefit from skilled acute PT to address balance and mobility deficits. Will cont to recommend SNF upon D/C.     PT Assessment  Patient needs continued PT services    Follow Up Recommendations  SNF    Does the patient have the potential to tolerate intense rehabilitation      Barriers to Discharge Decreased caregiver support pt lives alone; plans to D/C back to SNF    Equipment Recommendations  None recommended by PT    Recommendations for Other Services OT consult   Frequency Min 3X/week    Precautions / Restrictions Precautions Precautions: Fall;Shoulder Shoulder  Interventions: Shoulder sling/immobilizer;At all times Required Braces or Orthoses: Sling Restrictions Weight Bearing Restrictions: Yes LUE Weight Bearing: Non weight bearing   Pertinent Vitals/Pain 8/10 "when i move it" patient repositioned for comfort with Lt UE elevated with a  Pillow.       Mobility  Bed Mobility General bed mobility comments: not addressed; pt up in recliner with nursing upon admission Transfers Overall transfer level: Needs assistance Equipment used: 1 person hand held assist Transfers: Sit to/from Stand Sit to Stand: Min assist;Mod assist General transfer comment: pt required min (A) to achieve standing position from chair and mod (A) to achieve from lower surface toilet; pt very unsteady with transfers and using LEs to brace against chair/toilet; max cues for hand placement and sequencing  Ambulation/Gait Ambulation/Gait assistance: Mod assist Ambulation Distance (Feet): 60 Feet Assistive device: 1 person hand held assist Gait Pattern/deviations: Decreased stride length;Step-through pattern;Narrow base of support;Leaning posteriorly Gait velocity: very decreased Gait velocity interpretation: <1.8 ft/sec, indicative of risk for recurrent falls General Gait Details: pt leaning posteriorly and unsteady throughout mobility; required Rt UE max support and support around gt belt to maintain balance; pt looking at ground while ambulating and has increased fear of falling; max cues for safety and sequencing          PT Diagnosis: Difficulty walking;Acute pain  PT Problem List: Decreased strength;Decreased activity tolerance;Decreased balance;Decreased mobility;Decreased cognition;Decreased knowledge of use of DME;Decreased safety awareness;Decreased knowledge of precautions;Pain PT Treatment Interventions: DME instruction;Gait training;Functional mobility training;Therapeutic activities;Therapeutic exercise;Balance training;Patient/family education     PT  Goals(Current goals can be found in the care plan section) Acute Rehab PT Goals Patient  Stated Goal: wants her balance back PT Goal Formulation: With patient Time For Goal Achievement: 03/09/13 Potential to Achieve Goals: Good  Visit Information  Last PT Received On: 02/23/13 Assistance Needed: +1 History of Present Illness: Patient is 80 year old female with history of anxiety, depression, hypertension, hypothyroidism, osteoporosis who was recently discharged to SNF on 02/18/13 s/p ORIF of left distal radial fracture. She had closed manipulation of the left shoulder by Dr. Orlan Smith under anesthesia on 02/17/13. She had a fall 6 days ago when she sustained fractured proximal humerus and open distal radius fracture. During the follow up appointment with Dr. Orlan Smith, it was determined that the proximal humerus fracture is displaced and will need a surgical fixation. Patient was a scheduled to have appointment with Dr. Ave Smith on 02/23/13 and surgery scheduled for 02/24/13. However per patient and her family the left shoulder pain was not well controlled with oral medications. They brought her back to the ER for possible earlier surgery and better pain control. Pt planned for OR tomororw.        Prior Functioning  Home Living Family/patient expects to be discharged to:: Skilled nursing facility Additional Comments: Pt was at SNF and plans to return there for rehab; pt lives alone  Prior Function Level of Independence: Independent Comments: prior to first admission  Communication Communication: No difficulties Dominant Hand: Right    Cognition  Cognition Arousal/Alertness: Awake/alert Behavior During Therapy: Anxious Overall Cognitive Status: No family/caregiver present to determine baseline cognitive functioning Memory: Decreased short-term memory    Extremity/Trunk Assessment Upper Extremity Assessment Upper Extremity Assessment: Defer to OT evaluation Lower Extremity Assessment Lower  Extremity Assessment: Generalized weakness Cervical / Trunk Assessment Cervical / Trunk Assessment: Normal   Balance Balance Overall balance assessment: History of Falls;Needs assistance Postural control: Posterior lean Standing balance support: Single extremity supported;During functional activity Standing balance-Leahy Scale: Poor Standing balance comment: pt bracing LEs on chair/toilet; leaning posteriorly; unsteady  End of Session PT - End of Session Equipment Utilized During Treatment: Gait belt Activity Tolerance: Patient tolerated treatment well Patient left: in chair;with call bell/phone within reach Nurse Communication: Mobility status;Precautions;Weight bearing status  GP     Donell SievertWest, Destiny Trickey N, [place]PT [phone] 02/23/2013, 4:17 PM

## 2013-02-23 NOTE — Progress Notes (Signed)
INITIAL NUTRITION ASSESSMENT  DOCUMENTATION CODES Per approved criteria  -Severe malnutrition in the context of chronic illness -Underweight   INTERVENTION: Ensure Complete po BID, each supplement provides 350 kcal and 13 grams of protein  Encourage intake at meals. Ambassador to assist in meal choices.   NUTRITION DIAGNOSIS: Malnutrition related to chronic illness as evidenced by severe fat and muscle wasting. .   Goal: Pt to meet >/= 90% of their estimated nutrition needs   Monitor:  PO intake, supplement acceptance, weight trend, labs  Reason for Assessment: MAOI  80 y.o. female  Admitting Dx: Proximal humerus fracture  ASSESSMENT: Pt known from previous admission for left proximal humeral fx and left distal radial fx (s/p ORIF fr distal radial fx and closed manipulation of left shoulder) after a fall at home. Pt was d/c to SNF for rehab. Pt re-admitted for continued left shoulder pain.  Per pt she will be having surgery 1/22.  Pt sitting up in bed having just finished Breakfast. Tray not in room however pt reports good intake of eggs, oatmeal, fruit, and juice. Pt is willing to resume ensure complete while admitted.  Pt is on a MAOI which is a home medication. No diet changes needed as hospitalized diet is low in tyramine.   Nutrition Focused Physical Exam:  Subcutaneous Fat:  Orbital Region: WNL  Upper Arm Region: severe wasting Thoracic and Lumbar Region: severe wasting  Muscle:  Temple Region: severe wasting Clavicle Bone Region: severe wasting Clavicle and Acromion Bone Region: severe wasting Scapular Bone Region: severe wasting  Dorsal Hand: severe wasting Patellar Region: severe wasting Anterior Thigh Region: severe wasting Posterior Calf Region: severe wasting  Edema: left hand   Height: Ht Readings from Last 1 Encounters:  02/16/13 5\' 6"  (1.676 m)    Weight: Wt Readings from Last 1 Encounters:  02/16/13 104 lb 0.9 oz (47.2 kg)  No weight this  admission. Pt sitting on side of bed, unable to obtain bed weight.   Ideal Body Weight: 59 kg  % Ideal Body Weight: 80%  Wt Readings from Last 10 Encounters:  02/16/13 104 lb 0.9 oz (47.2 kg)  02/16/13 104 lb 0.9 oz (47.2 kg)  01/18/13 104 lb (47.174 kg)  01/04/13 104 lb 12.8 oz (47.537 kg)  10/01/12 108 lb (48.988 kg)  08/04/12 110 lb 12.8 oz (50.259 kg)  04/02/12 106 lb 3.2 oz (48.172 kg)  02/06/12 106 lb 6.4 oz (48.263 kg)  12/30/11 107 lb (48.535 kg)  12/18/11 106 lb (48.081 kg)    Usual Body Weight: 108 lb   % Usual Body Weight: 96%  BMI:  There is no weight on file to calculate BMI.  Estimated Nutritional Needs: Kcal: 1400-1500 Protein: 65-75 grams Fluid: > 1.5 L/day  Skin: incision left arm; head laceration  Diet Order: General  EDUCATION NEEDS: -No education needs identified at this time   Intake/Output Summary (Last 24 hours) at 02/23/13 0857 Last data filed at 02/23/13 0612  Gross per 24 hour  Intake    360 ml  Output      0 ml  Net    360 ml    Last BM: PTA   Labs:   Recent Labs Lab 02/18/13 0347 02/22/13 1310 02/23/13 0601  NA 129* 133* 134*  K 3.9 4.9 5.0  CL 94* 96 98  CO2 23 27 26   BUN 21 22 19   CREATININE 0.62 0.67 0.66  CALCIUM 8.1* 8.6 8.2*  GLUCOSE 124* 117* 96    CBG (  last 3)  No results found for this basename: GLUCAP,  in the last 72 hours  Scheduled Meds: . aspirin EC  81 mg Oral Daily  . busPIRone  7.5 mg Oral BID  . calcium carbonate  1 tablet Oral Q breakfast  . cholecalciferol  2,000 Units Oral Daily  . feeding supplement (ENSURE COMPLETE)  237 mL Oral BID BM  . irbesartan  300 mg Oral Daily  . levothyroxine  50 mcg Oral QAC breakfast  . LORazepam  0.5 mg Oral BID  . mercaptopurine  25 mg Oral QAC breakfast  . mirtazapine  45 mg Oral QHS  . phenelzine  7.5 mg Oral TID    Continuous Infusions: . sodium chloride      Past Medical History  Diagnosis Date  . Unspecified vitamin D deficiency   .  Headache(784.0) 02/15/2010  . ALLERGIC RHINITIS CAUSE UNSPECIFIED   . ANXIETY   . DEPRESSION, MAJOR, MODERATE   . HYPERTENSION, BENIGN ESSENTIAL   . HYPOTHYROIDISM   . OSTEOPOROSIS   . Autoimmune hepatitis     on MP6, prev pred    Past Surgical History  Procedure Laterality Date  . Cataract extraction  2008    Left eye  . Cataract extraction  2009    Right eye  . Tonsillectomy  1945  . Open reduction internal fixation (orif) distal radial fracture Left 02/16/2013    Procedure: OPEN REDUCTION INTERNAL FIXATION (ORIF) DISTAL RADIAL FRACTURE;  Surgeon: Sharma CovertFred W Ortmann, MD;  Location: [facility] OR;  Service: Orthopedics;  Laterality: Left;    Kendell BaneHeather Porcha Deblanc RD, LDN, CNSC [phone] Pager [phone] After Hours Pager

## 2013-02-24 ENCOUNTER — Encounter (HOSPITAL_COMMUNITY): Payer: Medicare Other | Admitting: Anesthesiology

## 2013-02-24 ENCOUNTER — Ambulatory Visit (HOSPITAL_COMMUNITY): Admission: RE | Admit: 2013-02-24 | Payer: Medicare Other | Source: Ambulatory Visit | Admitting: Orthopedic Surgery

## 2013-02-24 ENCOUNTER — Ambulatory Visit: Payer: Self-pay | Admitting: Podiatrist

## 2013-02-24 ENCOUNTER — Encounter (HOSPITAL_COMMUNITY)
Admission: EM | Disposition: A | Discharge status: Skilled Nursing Facility | Payer: Self-pay | Source: Home / Self Care | Attending: Orthopedic Surgery

## 2013-02-24 ENCOUNTER — Encounter (HOSPITAL_COMMUNITY): Payer: Self-pay | Admitting: General Practice

## 2013-02-24 ENCOUNTER — Inpatient Hospital Stay (HOSPITAL_COMMUNITY): Payer: Medicare Other

## 2013-02-24 ENCOUNTER — Inpatient Hospital Stay (HOSPITAL_COMMUNITY): Payer: Medicare Other | Admitting: Anesthesiology

## 2013-02-24 HISTORY — PX: HUMERUS IM NAIL: SHX1769

## 2013-02-24 LAB — CBC WITH DIFFERENTIAL/PLATELET
BASOS ABS: 0 10*3/uL (ref 0.0–0.1)
BASOS PCT: 0 % (ref 0–1)
Eosinophils Absolute: 0.1 10*3/uL (ref 0.0–0.7)
Eosinophils Relative: 2 % (ref 0–5)
HEMATOCRIT: 26.6 % — AB (ref 36.0–46.0)
Hemoglobin: 9.2 g/dL — ABNORMAL LOW (ref 12.0–15.0)
Lymphocytes Relative: 11 % — ABNORMAL LOW (ref 12–46)
Lymphs Abs: 0.9 10*3/uL (ref 0.7–4.0)
MCH: 34.7 pg — ABNORMAL HIGH (ref 26.0–34.0)
MCHC: 34.6 g/dL (ref 30.0–36.0)
MCV: 100.4 fL — AB (ref 78.0–100.0)
MONO ABS: 0.9 10*3/uL (ref 0.1–1.0)
Monocytes Relative: 11 % (ref 3–12)
Neutro Abs: 6.1 10*3/uL (ref 1.7–7.7)
Neutrophils Relative %: 76 % (ref 43–77)
PLATELETS: 234 10*3/uL (ref 150–400)
RBC: 2.65 MIL/uL — ABNORMAL LOW (ref 3.87–5.11)
RDW: 14 % (ref 11.5–15.5)
WBC: 7.9 10*3/uL (ref 4.0–10.5)

## 2013-02-24 LAB — BASIC METABOLIC PANEL
BUN: 18 mg/dL (ref 6–23)
CHLORIDE: 98 meq/L (ref 96–112)
CO2: 23 meq/L (ref 19–32)
Calcium: 8.3 mg/dL — ABNORMAL LOW (ref 8.4–10.5)
Creatinine, Ser: 0.54 mg/dL (ref 0.50–1.10)
GFR calc non Af Amer: 83 mL/min — ABNORMAL LOW (ref >90)
Glucose, Bld: 115 mg/dL — ABNORMAL HIGH (ref 70–99)
Potassium: 4.5 mEq/L (ref 3.7–5.3)
Sodium: 133 mEq/L — ABNORMAL LOW (ref 137–147)

## 2013-02-24 LAB — PROTIME-INR
INR: 0.99 (ref 0.00–1.49)
Prothrombin Time: 12.9 seconds (ref 11.6–15.2)

## 2013-02-24 LAB — SURGICAL PCR SCREEN
MRSA, PCR: NEGATIVE
Staphylococcus aureus: NEGATIVE

## 2013-02-24 SURGERY — INSERTION, INTRAMEDULLARY ROD, HUMERUS
Anesthesia: General | Site: Arm Upper | Laterality: Left

## 2013-02-24 MED ORDER — HYDROCODONE-ACETAMINOPHEN 5-325 MG PO TABS
1.0000 | ORAL_TABLET | ORAL | Status: DC | PRN
  Administered 2013-02-28 (×2): 1 via ORAL
  Filled 2013-02-24 (×2): qty 1

## 2013-02-24 MED ORDER — DIPHENHYDRAMINE HCL 12.5 MG/5ML PO ELIX
12.5000 mg | ORAL_SOLUTION | ORAL | Status: DC | PRN
  Filled 2013-02-24: qty 10

## 2013-02-24 MED ORDER — OXYCODONE HCL 5 MG PO TABS
ORAL_TABLET | ORAL | Status: AC
  Filled 2013-02-24: qty 1

## 2013-02-24 MED ORDER — NEOSTIGMINE METHYLSULFATE 1 MG/ML IJ SOLN
INTRAMUSCULAR | Status: DC | PRN
  Administered 2013-02-24: 3 mg via INTRAVENOUS

## 2013-02-24 MED ORDER — PROPOFOL 10 MG/ML IV BOLUS
INTRAVENOUS | Status: AC
  Filled 2013-02-24: qty 20

## 2013-02-24 MED ORDER — LIDOCAINE HCL (CARDIAC) 20 MG/ML IV SOLN
INTRAVENOUS | Status: AC
  Filled 2013-02-24: qty 5

## 2013-02-24 MED ORDER — ONDANSETRON HCL 4 MG PO TABS
4.0000 mg | ORAL_TABLET | Freq: Four times a day (QID) | ORAL | Status: DC | PRN
  Filled 2013-02-24: qty 1

## 2013-02-24 MED ORDER — BISACODYL 10 MG RE SUPP: 10.0000 mg | Freq: Every day | RECTAL | Status: DC | PRN

## 2013-02-24 MED ORDER — LIDOCAINE HCL (CARDIAC) 20 MG/ML IV SOLN
INTRAVENOUS | Status: DC | PRN
  Administered 2013-02-24: 40 mg via INTRAVENOUS

## 2013-02-24 MED ORDER — ROCURONIUM BROMIDE 100 MG/10ML IV SOLN
INTRAVENOUS | Status: DC | PRN
  Administered 2013-02-24: 30 mg via INTRAVENOUS

## 2013-02-24 MED ORDER — PHENOL 1.4 % MT LIQD: 1.0000 | OROMUCOSAL | Status: DC | PRN

## 2013-02-24 MED ORDER — ACETAMINOPHEN 650 MG RE SUPP
650.0000 mg | Freq: Four times a day (QID) | RECTAL | Status: DC | PRN
  Filled 2013-02-24: qty 1

## 2013-02-24 MED ORDER — HYDROMORPHONE HCL PF 1 MG/ML IJ SOLN
0.2500 mg | INTRAMUSCULAR | Status: DC | PRN
Start: 2013-02-24 — End: 2013-02-24
  Administered 2013-02-24 (×3): 0.5 mg via INTRAVENOUS

## 2013-02-24 MED ORDER — OXYCODONE-ACETAMINOPHEN 5-325 MG PO TABS: 1.0000 | ORAL_TABLET | ORAL | Status: DC | PRN

## 2013-02-24 MED ORDER — FLEET ENEMA 7-19 GM/118ML RE ENEM: 1.0000 | ENEMA | Freq: Once | RECTAL | Status: AC | PRN

## 2013-02-24 MED ORDER — METOCLOPRAMIDE HCL 5 MG PO TABS
5.0000 mg | ORAL_TABLET | Freq: Three times a day (TID) | ORAL | Status: DC | PRN
  Filled 2013-02-24: qty 2

## 2013-02-24 MED ORDER — OXYCODONE HCL 5 MG/5ML PO SOLN: 5.0000 mg | Freq: Once | ORAL | Status: AC | PRN

## 2013-02-24 MED ORDER — OXYCODONE HCL 5 MG PO TABS
5.0000 mg | ORAL_TABLET | Freq: Once | ORAL | Status: AC | PRN
  Administered 2013-02-24: 5 mg via ORAL

## 2013-02-24 MED ORDER — GLYCOPYRROLATE 0.2 MG/ML IJ SOLN
INTRAMUSCULAR | Status: AC
  Filled 2013-02-24: qty 2

## 2013-02-24 MED ORDER — CLINDAMYCIN PHOSPHATE 600 MG/50ML IV SOLN
600.0000 mg | Freq: Four times a day (QID) | INTRAVENOUS | Status: AC
  Administered 2013-02-24 – 2013-02-25 (×3): 600 mg via INTRAVENOUS
  Filled 2013-02-24 (×3): qty 50

## 2013-02-24 MED ORDER — GLYCOPYRROLATE 0.2 MG/ML IJ SOLN
INTRAMUSCULAR | Status: DC | PRN
Start: 2013-02-24 — End: 2013-02-24
  Administered 2013-02-24: 0.4 mg via INTRAVENOUS

## 2013-02-24 MED ORDER — MENTHOL 3 MG MT LOZG
1.0000 | LOZENGE | OROMUCOSAL | Status: DC | PRN
Start: 2013-02-24 — End: 2013-02-28

## 2013-02-24 MED ORDER — METOCLOPRAMIDE HCL 5 MG/ML IJ SOLN: 5.0000 mg | Freq: Three times a day (TID) | INTRAMUSCULAR | Status: DC | PRN

## 2013-02-24 MED ORDER — PHENYLEPHRINE HCL 10 MG/ML IJ SOLN
10.0000 mg | INTRAVENOUS | Status: DC | PRN
  Administered 2013-02-24: 25 ug/min via INTRAVENOUS

## 2013-02-24 MED ORDER — ROCURONIUM BROMIDE 50 MG/5ML IV SOLN
INTRAVENOUS | Status: AC
  Filled 2013-02-24: qty 1

## 2013-02-24 MED ORDER — METOCLOPRAMIDE HCL 5 MG/ML IJ SOLN: 10.0000 mg | Freq: Once | INTRAMUSCULAR | Status: DC | PRN

## 2013-02-24 MED ORDER — FENTANYL CITRATE 0.05 MG/ML IJ SOLN
INTRAMUSCULAR | Status: DC | PRN
  Administered 2013-02-24 (×2): 50 ug via INTRAVENOUS

## 2013-02-24 MED ORDER — ONDANSETRON HCL 4 MG/2ML IJ SOLN
INTRAMUSCULAR | Status: DC | PRN
  Administered 2013-02-24: 4 mg via INTRAVENOUS

## 2013-02-24 MED ORDER — ONDANSETRON HCL 4 MG/2ML IJ SOLN
4.0000 mg | Freq: Four times a day (QID) | INTRAMUSCULAR | Status: DC | PRN
  Filled 2013-02-24: qty 2

## 2013-02-24 MED ORDER — ALUMINUM HYDROXIDE GEL 320 MG/5ML PO SUSP
15.0000 mL | ORAL | Status: DC | PRN
  Filled 2013-02-24: qty 30

## 2013-02-24 MED ORDER — ACETAMINOPHEN 325 MG PO TABS
650.0000 mg | ORAL_TABLET | Freq: Four times a day (QID) | ORAL | Status: DC | PRN
  Administered 2013-02-26 – 2013-02-28 (×4): 650 mg via ORAL
  Filled 2013-02-24 (×5): qty 2

## 2013-02-24 MED ORDER — POLYETHYLENE GLYCOL 3350 17 G PO PACK: 17.0000 g | PACK | Freq: Every day | ORAL | Status: DC | PRN

## 2013-02-24 MED ORDER — 0.9 % SODIUM CHLORIDE (POUR BTL) OPTIME
TOPICAL | Status: DC | PRN
  Administered 2013-02-24: 1000 mL

## 2013-02-24 MED ORDER — LACTATED RINGERS IV SOLN
INTRAVENOUS | Status: DC | PRN
  Administered 2013-02-24 (×2): via INTRAVENOUS

## 2013-02-24 MED ORDER — SODIUM CHLORIDE 0.9 % IV SOLN
INTRAVENOUS | Status: DC
  Administered 2013-02-24 – 2013-02-25 (×2): via INTRAVENOUS

## 2013-02-24 MED ORDER — ONDANSETRON HCL 4 MG/2ML IJ SOLN
INTRAMUSCULAR | Status: AC
  Filled 2013-02-24: qty 2

## 2013-02-24 MED ORDER — PHENYLEPHRINE HCL 10 MG/ML IJ SOLN
INTRAMUSCULAR | Status: AC
  Filled 2013-02-24: qty 1

## 2013-02-24 MED ORDER — OXYCODONE HCL 5 MG PO TABS
5.0000 mg | ORAL_TABLET | ORAL | Status: DC | PRN
  Administered 2013-02-24 – 2013-02-28 (×7): 5 mg via ORAL
  Filled 2013-02-24 (×7): qty 1

## 2013-02-24 MED ORDER — HYDROMORPHONE HCL PF 1 MG/ML IJ SOLN
INTRAMUSCULAR | Status: AC
  Filled 2013-02-24: qty 1

## 2013-02-24 MED ORDER — MORPHINE SULFATE 2 MG/ML IJ SOLN: 1.0000 mg | INTRAMUSCULAR | Status: DC | PRN

## 2013-02-24 MED ORDER — DOCUSATE SODIUM 100 MG PO CAPS
100.0000 mg | ORAL_CAPSULE | Freq: Two times a day (BID) | ORAL | Status: DC
  Administered 2013-02-24 – 2013-02-28 (×8): 100 mg via ORAL
  Filled 2013-02-24 (×11): qty 1

## 2013-02-24 MED ORDER — PROPOFOL 10 MG/ML IV BOLUS
INTRAVENOUS | Status: DC | PRN
  Administered 2013-02-24: 100 mg via INTRAVENOUS

## 2013-02-24 MED ORDER — FENTANYL CITRATE 0.05 MG/ML IJ SOLN
INTRAMUSCULAR | Status: AC
  Filled 2013-02-24: qty 5

## 2013-02-24 SURGICAL SUPPLY — 61 items
BIT DRILL 35MM (DRILL) ×1
BIT DRILL 3X200 (DRILL) ×2 IMPLANT
BLADE SURG 15 STRL LF DISP TIS (BLADE) IMPLANT
BLADE SURG 15 STRL SS (BLADE)
CHLORAPREP W/TINT 26ML (MISCELLANEOUS) ×3 IMPLANT
CLOSURE WOUND 1/2 X4 (GAUZE/BANDAGES/DRESSINGS) ×1
CLOTH BEACON ORANGE TIMEOUT ST (SAFETY) IMPLANT
COVER SURGICAL LIGHT HANDLE (MISCELLANEOUS) ×3 IMPLANT
DRAPE INCISE IOBAN 66X45 STRL (DRAPES) ×3 IMPLANT
DRAPE SURG 17X23 STRL (DRAPES) ×3 IMPLANT
DRAPE U-SHAPE 47X51 STRL (DRAPES) ×3 IMPLANT
DRSG ADAPTIC 3X8 NADH LF (GAUZE/BANDAGES/DRESSINGS) ×3 IMPLANT
DRSG PAD ABDOMINAL 8X10 ST (GAUZE/BANDAGES/DRESSINGS) ×6 IMPLANT
ELECT BLADE 4.0 EZ CLEAN MEGAD (MISCELLANEOUS)
ELECT REM PT RETURN 9FT ADLT (ELECTROSURGICAL) ×3
ELECTRODE BLDE 4.0 EZ CLN MEGD (MISCELLANEOUS) IMPLANT
ELECTRODE REM PT RTRN 9FT ADLT (ELECTROSURGICAL) ×1 IMPLANT
GLOVE BIO SURGEON STRL SZ7 (GLOVE) ×3 IMPLANT
GLOVE BIO SURGEON STRL SZ7.5 (GLOVE) IMPLANT
GLOVE BIOGEL PI IND STRL 8 (GLOVE) IMPLANT
GLOVE BIOGEL PI INDICATOR 8 (GLOVE)
GOWN PREVENTION PLUS LG XLONG (DISPOSABLE) ×3 IMPLANT
GOWN PREVENTION PLUS XLARGE (GOWN DISPOSABLE) ×3 IMPLANT
GOWN STRL NON-REIN LRG LVL3 (GOWN DISPOSABLE) ×6 IMPLANT
KIT BASIN OR (CUSTOM PROCEDURE TRAY) ×3 IMPLANT
KIT ROOM TURNOVER OR (KITS) ×3 IMPLANT
MANIFOLD NEPTUNE II (INSTRUMENTS) ×3 IMPLANT
NAIL HUMERAL LET 8X130MM (Nail) ×3 IMPLANT
NEEDLE HYPO 25GX1X1/2 BEV (NEEDLE) IMPLANT
NEEDLE MAYO TROCAR (NEEDLE) IMPLANT
NS IRRIG 1000ML POUR BTL (IV SOLUTION) ×3 IMPLANT
PACK SHOULDER (CUSTOM PROCEDURE TRAY) ×3 IMPLANT
PAD ARMBOARD 7.5X6 YLW CONV (MISCELLANEOUS) ×6 IMPLANT
SCREW DISTAL 4.3X24MM (Screw) ×3 IMPLANT
SCREW DISTAL 4.3X26MM (Screw) ×3 IMPLANT
SCREW PROXIMAL 5X36MM (Screw) ×3 IMPLANT
SCREW PROXIMAL CANN 5X44MM (Screw) ×3 IMPLANT
SLING ARM IMMOBILIZER LRG (SOFTGOODS) IMPLANT
SLING ARM IMMOBILIZER MED (SOFTGOODS) IMPLANT
SPONGE GAUZE 4X4 12PLY (GAUZE/BANDAGES/DRESSINGS) ×3 IMPLANT
SPONGE LAP 18X18 X RAY DECT (DISPOSABLE) ×3 IMPLANT
SPONGE LAP 4X18 X RAY DECT (DISPOSABLE) IMPLANT
STRIP CLOSURE SKIN 1/2X4 (GAUZE/BANDAGES/DRESSINGS) ×2 IMPLANT
SUCTION FRAZIER TIP 10 FR DISP (SUCTIONS) ×3 IMPLANT
SUPPORT WRAP ARM LG (MISCELLANEOUS) IMPLANT
SUT FIBERWIRE #2 38 T-5 BLUE (SUTURE)
SUT MNCRL AB 4-0 PS2 18 (SUTURE) ×3 IMPLANT
SUT PDS AB 1 CT  36 (SUTURE) ×2
SUT PDS AB 1 CT 36 (SUTURE) ×1 IMPLANT
SUT SILK 2 0 TIES 17X18 (SUTURE)
SUT SILK 2-0 18XBRD TIE BLK (SUTURE) IMPLANT
SUT VIC AB 0 CTB1 27 (SUTURE) IMPLANT
SUT VIC AB 1 CT1 27 (SUTURE)
SUT VIC AB 1 CT1 27XBRD ANTBC (SUTURE) IMPLANT
SUTURE FIBERWR #2 38 T-5 BLUE (SUTURE) IMPLANT
SYR CONTROL 10ML LL (SYRINGE) ×3 IMPLANT
TOWEL OR 17X24 6PK STRL BLUE (TOWEL DISPOSABLE) ×3 IMPLANT
TOWEL OR 17X26 10 PK STRL BLUE (TOWEL DISPOSABLE) ×3 IMPLANT
WATER STERILE IRR 1000ML POUR (IV SOLUTION) ×3 IMPLANT
WIRE GUIDE MODEL 22X500MM (WIRE) ×3 IMPLANT
YANKAUER SUCT BULB TIP NO VENT (SUCTIONS) IMPLANT

## 2013-02-24 NOTE — Anesthesia Postprocedure Evaluation (Signed)
  Anesthesia Post-op Note  Patient: Marisa Smith  Procedure(s) Performed: Procedure(s): INTRAMEDULLARY (IM) NAIL LEFT PROXIMAL  HUMERUS (Left)  Patient Location: PACU  Anesthesia Type:General  Level of Consciousness: awake, alert  and oriented  Airway and Oxygen Therapy: Patient Spontanous Breathing and Patient connected to nasal cannula oxygen  Post-op Pain: mild  Post-op Assessment: Post-op Vital signs reviewed  Post-op Vital Signs: Reviewed  Complications: No apparent anesthesia complications

## 2013-02-24 NOTE — Anesthesia Preprocedure Evaluation (Addendum)
Anesthesia Evaluation  Patient identified by MRN, date of birth, ID band Patient awake    Reviewed: Allergy & Precautions, H&P , NPO status , Patient's Chart, lab work & pertinent test results, reviewed documented beta blocker date and time   History of Anesthesia Complications Negative for: history of anesthetic complications  Airway Mallampati: II TM Distance: >3 FB Neck ROM: full    Dental  (+) Teeth Intact and Dental Advisory Given   Pulmonary neg pulmonary ROS,  breath sounds clear to auscultation        Cardiovascular hypertension, Pt. on medications negative cardio ROS  Rhythm:regular     Neuro/Psych  Headaches, PSYCHIATRIC DISORDERS Anxiety Depression On MAOI inhibitor!!   GI/Hepatic negative GI ROS, (+) Hepatitis -, Autoimmune  Endo/Other  Hypothyroidism   Renal/GU negative Renal ROS  negative genitourinary   Musculoskeletal   Abdominal   Peds  Hematology  (+) Blood dyscrasia, anemia ,   Anesthesia Other Findings See surgeon's H&P   Reproductive/Obstetrics negative OB ROS                          Anesthesia Physical Anesthesia Plan  ASA: III  Anesthesia Plan: General   Post-op Pain Management:    Induction: Intravenous  Airway Management Planned: Oral ETT  Additional Equipment:   Intra-op Plan:   Post-operative Plan: Extubation in OR  Informed Consent: I have reviewed the patients History and Physical, chart, labs and discussed the procedure including the risks, benefits and alternatives for the proposed anesthesia with the patient or authorized representative who has indicated his/her understanding and acceptance.   Dental Advisory Given  Plan Discussed with: CRNA and Surgeon  Anesthesia Plan Comments:         Anesthesia Quick Evaluation

## 2013-02-24 NOTE — Transfer of Care (Signed)
Immediate Anesthesia Transfer of Care Note  Patient: Marisa Smith  Procedure(s) Performed: Procedure(s): INTRAMEDULLARY (IM) NAIL LEFT PROXIMAL  HUMERUS (Left)  Patient Location: PACU  Anesthesia Type:General  Level of Consciousness: awake, alert  and oriented  Airway & Oxygen Therapy: Patient Spontanous Breathing and Patient connected to nasal cannula oxygen  Post-op Assessment: Report given to PACU RN and Post -op Vital signs reviewed and stable  Post vital signs: Reviewed and stable  Complications: No apparent anesthesia complications

## 2013-02-24 NOTE — Op Note (Signed)
Procedure(s): INTRAMEDULLARY (IM) NAIL LEFT PROXIMAL  HUMERUS Procedure Note  Marisa Smith female 80 y.o. 02/24/2013  Procedure(s) and Anesthesia Type:    * INTRAMEDULLARY (IM) NAIL LEFT PROXIMAL  HUMERUS  - General  Surgeon(s) and Role:    * Mable ParisJustin William Rexford Prevo, MD - Primary   Indications:  80 y.o. female s/p fall with left displaced proximal humerus fracture. It was initially treated nonoperatively, however further displacement necessitated surgical management.     Surgeon: Mable ParisHANDLER,Giliana Vantil WILLIAM   Assistants: Damita Lackanielle Lalibert PA-C [facility](Danielle was present and scrubbed throughout the procedure and was essential in positioning, retraction, exposure, and closure)  Anesthesia: General endotracheal anesthesia    Procedure Detail  INTRAMEDULLARY (IM) NAIL LEFT PROXIMAL  HUMERUS  Findings: Tornier proximal humeral nail, improved alignment. 2 proximal and 2 distal screws.  Estimated Blood Loss:  less than 50 mL         Drains: none  Blood Given: none         Specimens: none        Complications:  * No complications entered in OR log *         Disposition: PACU - hemodynamically stable.         Condition: stable    Procedure:   DESCRIPTION OF PROCEDURE: The patient was identified in preoperative  holding area where I personally marked the operative site after  verifying site, side, and procedure with the patient. The patient was taken back  to the operating room where general anesthesia was induced without  complication and was placed in the beach-chair position with the back  elevated about 40 degrees and all extremities carefully padded and  positioned. The neck was turned very slightly away from the operative field  to assist in exposure. The left upper extremity was then prepped and  draped in a standard sterile fashion. The appropriate time-out  procedure was carried out. The patient did receive IV antibiotics  within 30 minutes of incision.  An incision  was made in [employer] lines over the anterolateral edge of the acromion. The incision was about 4 cm long dissection was carried down to the deltoid which was split off the anterolateral edge of the acromion and elevated off of the anterior acromion. The rotator cuff was exposed. The fracture was reduced in acceptable position and the entry awl was used to create an entry hole. The guidepin was advanced across the fracture. Fracture was held reduced while the nail was advanced. Bone quality was noted to be poor. The fracture was then held reduced while 2 proximal interlocking screws were placed taking care to spread down to bone to avoid neurovascular injury. The distal 2 interlocking screws were then placed holding the reduction. Entry hole was noted to be slightly anterior, but reduction was felt to be acceptable to stabilize this fracture and allow healing. Wounds were copiously irrigated and some closed in layers with 2-0 Vicryl and 4-0 Monocryl. Steri-Strips were applied. A light sterile dressing was applied to   POSTOPERATIVE PLAN: She will be kept in the hospital for therapy, pain control, antibiotics for 23 hours. She can go back to the skilled nursing facility soon as she is able. She will remain in a sling on the left side

## 2013-02-24 NOTE — Progress Notes (Signed)
Marisa Smith ZOX:[id number] DOB: 25-Jul-1924 DOA: 02/22/2013 PCP: Rene Paci, MD  Brief narrative: 80 y/o ?, known chronic hyponatremia, prediabetes, stable hypothyroidism, autoimmune hepatitis diagnosed 2007 foll by Dr. Johnney Killian mercaptopurine], recent complex Orthopedic h/o wherein patient fell, was found on the floor and underwent ORIF for L distal radial # + closed manipulation by Dr. Orlan Leavens.  Patient re-admitted from nursing facility by Dr. Kevan Ny as it was determined that Prox humerus was displaced and would need surgical fixation-surgery was scheduled for 1/22 however because patient was in intense pain, patient was brought to the emergency room 1/20 and admitted for pain control by hospitalist service, with a plan to have a definitive fixation 02/24/13  I have discussed with Dr. Ave Filter that once patient goes to surgery, patient will be on orthopedic service  Past medical history-As per Problem list Chart reviewed as below-   Consultants:  Orthopedics-Dr. Ave Filter  Procedures:    Antibiotics:  None currently   Subjective  Doing fair. Scared of surgery No other issues today otherwise Patient about to go to surgery    Objective    Interim History: None  Telemetry: None   Objective: Filed Vitals:   02/23/13 1300 02/23/13 2032 02/24/13 0622 02/24/13 1440  BP: 115/46 150/63 148/68 128/61  Pulse: 79 91 92 85  Temp: 97.2 F (36.2 C) 98.3 F (36.8 C) 98.6 F (37 C) 98.1 F (36.7 C)  TempSrc: Oral Oral Oral   Resp: 18 18 18 18   SpO2: 99% 100% 98% 98%    Intake/Output Summary (Last 24 hours) at 02/24/13 1520 Last data filed at 02/24/13 1300  Gross per 24 hour  Intake    600 ml  Output      0 ml  Net    600 ml    Exam:  General: EOMI, NCAT Cardiovascular: S1-S2 no murmur rub or gallop  Respiratory: Clinically clear Abdomen: Soft nontender nondistended Skin left arm in a sling. Did not assess range of motion Neuro stable, moving all 4  extremities other than left upper extremity, pulses present  Data Reviewed: Basic Metabolic Panel:  Recent Labs Lab 02/18/13 0347 02/22/13 1310 02/23/13 0601 02/24/13 1224  NA 129* 133* 134* 133*  K 3.9 4.9 5.0 4.5  CL 94* 96 98 98  CO2 23 27 26 23   GLUCOSE 124* 117* 96 115*  BUN 21 22 19 18   CREATININE 0.62 0.67 0.66 0.54  CALCIUM 8.1* 8.6 8.2* 8.3*   Liver Function Tests: No results found for this basename: AST, ALT, ALKPHOS, BILITOT, PROT, ALBUMIN,  in the last 168 hours No results found for this basename: LIPASE, AMYLASE,  in the last 168 hours No results found for this basename: AMMONIA,  in the last 168 hours CBC:  Recent Labs Lab 02/18/13 0347 02/22/13 1310 02/23/13 0601 02/24/13 1224  WBC 5.6 7.8 6.2 7.9  NEUTROABS  --  5.6  --  6.1  HGB 9.0* 9.2* 8.4* 9.2*  HCT 25.9* 27.0* 24.7* 26.6*  MCV 97.7 100.0 101.2* 100.4*  PLT 132* 252 229 234   Cardiac Enzymes: No results found for this basename: CKTOTAL, CKMB, CKMBINDEX, TROPONINI,  in the last 168 hours BNP: No components found with this basename: POCBNP,  CBG: No results found for this basename: GLUCAP,  in the last 168 hours  Recent Results (from the past 240 hour(s))  MRSA PCR SCREENING     Status: None   Collection Time    02/16/13  5:24 PM  Result Value Range Status   MRSA by PCR NEGATIVE  NEGATIVE Final   Comment:            The GeneXpert MRSA Assay (FDA     approved for NASAL specimens     only), is one component of a     comprehensive MRSA colonization     surveillance program. It is not     intended to diagnose MRSA     infection nor to guide or     monitor treatment for     MRSA infections.  SURGICAL PCR SCREEN     Status: None   Collection Time    02/24/13  5:50 AM      Result Value Range Status   MRSA, PCR NEGATIVE  NEGATIVE Final   Staphylococcus aureus NEGATIVE  NEGATIVE Final   Comment:            The Xpert SA Assay (FDA     approved for NASAL specimens     in patients over  78 years of age),     is one component of     a comprehensive surveillance     program.  Test performance has     been validated by [facility] for patients greater     than or equal to 10522 year old.     It is not intended     to diagnose infection nor to     guide or monitor treatment.     Studies:              All Imaging reviewed and is as per above notation   Scheduled Meds: . [facility][MAR HOLD] aspirin EC  81 mg Oral Daily  . [MAR HOLD] busPIRone  7.5 mg Oral BID  . [facility][MAR HOLD] calcium carbonate  1 tablet Oral Q breakfast  . Chlorhexidine Gluconate Cloth  6 each Topical Once  . [facility][MAR HOLD] cholecalciferol  2,000 Units Oral Daily  . clindamycin (CLEOCIN) IV  900 mg Intravenous On Call to OR  . [MAR HOLD] feeding supplement (ENSURE COMPLETE)  237 mL Oral BID BM  . [MAR HOLD] irbesartan  300 mg Oral Daily  . [facility][MAR HOLD] levothyroxine  50 mcg Oral QAC breakfast  . [MAR HOLD] LORazepam  0.5 mg Oral BID  . [facility][MAR HOLD] mercaptopurine  25 mg Oral QAC breakfast  . [MAR HOLD] mirtazapine  45 mg Oral QHS  . [facility][MAR HOLD] phenelzine  7.5 mg Oral TID   Continuous Infusions: . sodium chloride 50 mL/hr at 02/23/13 2045     Assessment/Plan:  1. Proximal left humerus fracture-as per orthopedic surgery. Will follow post-op d#1.  Monitor lytes and cbc am 2. Hypothyroidism-continue Synthroid 50 mcg.  TSH therapeutic. 3. Hypertension-continue irbesartan 300 mg daily.    4. Borderline anemia, macrocytic vs dilutional-type and screened, monitor-transfuse if Hb below 7.0 5. Mild hyponatremia, euvolemic-continue saline, cut back rate to 50 cc an hour 6. Falls-potentially related to Ortho stasis.   Phenelzine 7.5 tid-has an additive theoretical  effect with ARB.  Will recommend on discharge that nursing home physician coordinate with psychiatrist to slowly taper off this medication given risks for falls in the future.  High risk med 7. Autoimmune hepatitis-continue mercaptopurine 25 mg every morning.   Outpatient followup needed with Dr. Christella HartiganJacobs of GI 8. Depression-some therapeutic duplication with buspar 7.5 twice a day, Nardil 7.5 3 times a day, mirtazapine 45 mg each bedtime, lorazepam 0.5 twice a day-will need  to exercise caution with pain medications as this can potentiate the effects of this and cause confusion 9. Potential TIA in the past?-could have also been polypharmacy.  For now continue aspirin 81 daily 10. Impaired glucose tolerance-monitor CBG's  Code Status: Full Family Communication: Discussed plan of care with patient alone Disposition Plan: Inpatient pending surgery   Pleas Koch, MD  [facility] Pager [phone] 02/24/2013, 3:20 PM    LOS: 2 days

## 2013-02-24 NOTE — Progress Notes (Signed)
PATIENT ID: Marisa Smith     Procedure(s) (LRB): INTRAMEDULLARY (IM) NAIL LEFT PROXIMAL  HUMERUS (Left)  Subjective: Feeling okay today. Tolerating sling and po pain rx well. No other complaints or concerns.   Objective:  Filed Vitals:   02/24/13 0622  BP: 148/68  Pulse: 92  Temp: 98.6 F (37 C)  Resp: 18     Awake, alert, orientated L UE in sling, short arm cast, mod hand swelling Wiggles fingers, distally nvi  Labs:   Recent Labs  02/22/13 1310 02/23/13 0601  HGB 9.2* 8.4*   Recent Labs  02/22/13 1310 02/23/13 0601  WBC 7.8 6.2  RBC 2.70* 2.44*  HCT 27.0* 24.7*  PLT 252 229   Recent Labs  02/22/13 1310 02/23/13 0601  NA 133* 134*  K 4.9 5.0  CL 96 98  CO2 27 26  BUN 22 19  CREATININE 0.67 0.66  GLUCOSE 117* 96  CALCIUM 8.6 8.2*    Assessment and Plan: Plan for left IM humeral nail today, NPO Will admit to ortho service post operatively  Plan for d/c to [facility] tomorrow  VTE proph: primary team, ASA 81 mg daily

## 2013-02-24 NOTE — Interval H&P Note (Signed)
History and Physical Interval Note:  02/24/2013 3:14 PM  Marisa Smith  has presented today for surgery, with the diagnosis of LEFT PROXIMAL HUMERUS FRACTURE  The various methods of treatment have been discussed with the patient and family. After consideration of risks, benefits and other options for treatment, the patient has consented to  Procedure(s): INTRAMEDULLARY (IM) NAIL LEFT PROXIMAL  HUMERUS (Left) as a surgical intervention .  The patient's history has been reviewed, patient examined, no change in status, stable for surgery.  I have reviewed the patient's chart and labs.  Questions were answered to the patient's satisfaction.     Mable ParisHANDLER,Jeniah Kishi WILLIAM

## 2013-02-24 NOTE — Preoperative (Signed)
Beta Blockers   Reason not to administer Beta Blockers:Not Applicable 

## 2013-02-24 NOTE — Anesthesia Procedure Notes (Addendum)
Procedure Name: Intubation Date/Time: 02/24/2013 3:52 PM Performed by: Lovie CholOCK, JENNIFER K Pre-anesthesia Checklist: Patient identified, Emergency Drugs available, Suction available, Patient being monitored and Timeout performed Patient Re-evaluated:Patient Re-evaluated prior to inductionOxygen Delivery Method: Circle system utilized Preoxygenation: Pre-oxygenation with 100% oxygen Intubation Type: IV induction Ventilation: Mask ventilation without difficulty Laryngoscope Size: Miller and 2 Grade View: Grade I Tube type: Oral Tube size: 7.0 mm Number of attempts: 1 Airway Equipment and Method: Stylet Placement Confirmation: ETT inserted through vocal cords under direct vision,  positive ETCO2,  CO2 detector and breath sounds checked- equal and bilateral Secured at: 21 cm Tube secured with: Tape Dental Injury: Teeth and Oropharynx as per pre-operative assessment     Anesthesia Regional Block:   Narrative:

## 2013-02-25 LAB — CBC
HCT: 29 % — ABNORMAL LOW (ref 36.0–46.0)
Hemoglobin: 9.8 g/dL — ABNORMAL LOW (ref 12.0–15.0)
MCH: 34.1 pg — ABNORMAL HIGH (ref 26.0–34.0)
MCHC: 33.8 g/dL (ref 30.0–36.0)
MCV: 101 fL — AB (ref 78.0–100.0)
PLATELETS: 267 10*3/uL (ref 150–400)
RBC: 2.87 MIL/uL — AB (ref 3.87–5.11)
RDW: 13.8 % (ref 11.5–15.5)
WBC: 10.4 10*3/uL (ref 4.0–10.5)

## 2013-02-25 MED ORDER — PHENELZINE SULFATE 15 MG PO TABS
7.5000 mg | ORAL_TABLET | Freq: Two times a day (BID) | ORAL | Status: DC
  Administered 2013-02-25 – 2013-02-28 (×6): 7.5 mg via ORAL
  Filled 2013-02-25 (×7): qty 1

## 2013-02-25 MED ORDER — IRBESARTAN 150 MG PO TABS
150.0000 mg | ORAL_TABLET | Freq: Every day | ORAL | Status: DC
  Administered 2013-02-26 – 2013-02-28 (×3): 150 mg via ORAL
  Filled 2013-02-25 (×3): qty 1

## 2013-02-25 NOTE — Evaluation (Signed)
Physical Therapy RE- Evaluation Patient Details Name: Marisa Smith MRN: [id number] DOB: [birth year] Today's Date: 02/25/2013 Time: [id number] PT Time Calculation (min): 27 min  PT Assessment / Plan / Recommendation History of Present Illness  Patient is 80 year old female with history of anxiety, depression, hypertension, hypothyroidism, osteoporosis who was recently discharged to SNF on 02/18/13 s/p ORIF of left distal radial fracture. She had closed manipulation of the left shoulder by Dr. Orlan Leavensrtman under anesthesia on 02/17/13. She had a fall 6 days ago when she sustained fractured proximal humerus and open distal radius fracture. During the follow up appointment with Dr. Orlan Leavensrtman, it was determined that the proximal humerus fracture is displaced and will need a surgical fixation. Patient was a scheduled to have appointment with Dr. Ave Filterhandler on 02/23/13 and surgery scheduled for 02/24/13. However per patient and her family the left shoulder pain was not well controlled with oral medications. They brought her back to the ER for possible earlier surgery and better pain control. Pt. underwent (L)  IM nail of proximal humerus fx. on 02/24/13.  Clinical Impression  Pt. Presents to PT with multiple complaints and with decreased balance, safety, gait and functional mobility  She will benefit from acute PT to address these and below issues.  I recommend she will need SNF level therapies upon DC from acute.  PT Assessment  Patient needs continued PT services    Follow Up Recommendations  SNF    Does the patient have the potential to tolerate intense rehabilitation      Barriers to Discharge Decreased caregiver support      Equipment Recommendations  Other (comment) (may need unilateral device such as quad cane)    Recommendations for Other Services     Frequency Min 3X/week    Precautions / Restrictions Precautions Precautions: Fall;Shoulder Type of Shoulder Precautions: sling Shoulder Interventions:  Shoulder sling/immobilizer;At all times Precaution Booklet Issued: No Required Braces or Orthoses: Sling Restrictions LUE Weight Bearing: Non weight bearing   Pertinent Vitals/Pain See vitals tab       Mobility  Bed Mobility Overal bed mobility:  (pt. up in recliner) Transfers Overall transfer level: Needs assistance Equipment used: 1 person hand held assist Transfers: Sit to/from Stand Sit to Stand: Mod assist General transfer comment: Pt. needed mod assist to rise to stand and min assist to control descent to chair.  Pt. with moderate posterior lean initially Ambulation/Gait Ambulation/Gait assistance: Mod assist Ambulation Distance (Feet): 15 Feet Assistive device: 1 person hand held assist Gait Pattern/deviations: Decreased step length - right;Decreased step length - left;Shuffle;Leaning posteriorly Gait velocity: very decreased General Gait Details: Pt. needed support at gait belt and R UE to assist with balance.  She takes very short steps and is fearful of falling    Exercises     PT Diagnosis: Difficulty walking;Acute pain  PT Problem List: Decreased strength;Decreased activity tolerance;Decreased balance;Decreased mobility;Decreased cognition;Decreased knowledge of use of DME;Decreased safety awareness;Decreased knowledge of precautions;Pain PT Treatment Interventions: DME instruction;Gait training;Functional mobility training;Therapeutic activities;Balance training;Patient/family education     PT Goals(Current goals can be found in the care plan section) Acute Rehab PT Goals Patient Stated Goal: rehab then home PT Goal Formulation: With patient Time For Goal Achievement: 03/04/13 Potential to Achieve Goals: Good  Visit Information  Last PT Received On: 02/25/13 Assistance Needed: +1 History of Present Illness: Patient is 80 year old female with history of anxiety, depression, hypertension, hypothyroidism, osteoporosis who was recently discharged to SNF on  02/18/13 s/p ORIF of left distal radial fracture. She  had closed manipulation of the left shoulder by Dr. Orlan Leavens under anesthesia on 02/17/13. She had a fall 6 days ago when she sustained fractured proximal humerus and open distal radius fracture. During the follow up appointment with Dr. Orlan Leavens, it was determined that the proximal humerus fracture is displaced and will need a surgical fixation. Patient was a scheduled to have appointment with Dr. Ave Filter on 02/23/13 and surgery scheduled for 02/24/13. However per patient and her family the left shoulder pain was not well controlled with oral medications. They brought her back to the ER for possible earlier surgery and better pain control. Pt. underwent (L)  IM nail of proximal humerus fx. on 02/24/13.       Prior Functioning  Home Living Family/patient expects to be discharged to:: Unsure Living Arrangements: Other (Comment) (recently at [facility]) Additional Comments: Pt. at [facility], was limving alone PTA as her husband is in a facility by her report Prior Function Level of Independence: Independent Comments: prior to first admission  Communication Communication: No difficulties Dominant Hand: Right    Cognition  Cognition Arousal/Alertness: Awake/alert Behavior During Therapy: Anxious Overall Cognitive Status: No family/caregiver present to determine baseline cognitive functioning Memory: Decreased short-term memory    Extremity/Trunk Assessment Upper Extremity Assessment Upper Extremity Assessment: Defer to OT evaluation Lower Extremity Assessment Lower Extremity Assessment: Generalized weakness Cervical / Trunk Assessment Cervical / Trunk Assessment: Normal   Balance Balance Overall balance assessment: Needs assistance Standing balance support: Single extremity supported (R UE at sink) Standing balance-Leahy Scale: Poor Standing balance comment: Pt. leans posteriorly in standing while brushing teeth at sink x 5 minutes  End of Session PT  - End of Session Equipment Utilized During Treatment: Gait belt Activity Tolerance: Patient tolerated treatment well Patient left: in chair;with call bell/phone within reach Nurse Communication: Mobility status;Precautions;Weight bearing status  GP     Ferman Hamming 02/25/2013, 2:14 PM Weldon Picking PT Acute Rehab Services [phone] Beeper [phone]

## 2013-02-25 NOTE — Progress Notes (Signed)
Marisa Smith ZOX:[id number] DOB: 11/23/1924 DOA: 02/22/2013 PCP: Rene PaciValerie Leschber, MD  Brief narrative: 80 y/o ?, known chronic hyponatremia, prediabetes, stable hypothyroidism, autoimmune hepatitis diagnosed 2007 foll by Dr. Johnney KillianJacobs [on mercaptopurine], recent complex Orthopedic h/o wherein patient fell, was found on the floor and underwent ORIF for L distal radial # + closed manipulation by Dr. Orlan Leavensrtman.  Patient re-admitted from nursing facility by Dr. Kevan NyGates as it was determined that Prox humerus was displaced and would need surgical fixation-surgery was scheduled for 1/22 however because patient was in intense pain, patient was brought to the emergency room 1/20 and admitted for pain control by hospitalist service Subsequenlty underwent IM nailing left proximal humerus 1/22  Past medical history-As per Problem list Chart reviewed as below-   Consultants:  Orthopedics-Dr. Ave Filterhandler  Procedures:  IM nailing left occipital humerus 1/22  Antibiotics:  Perioperative clindamycin   Subjective  Doing fair. Not eating well Pain 5/10 left arm Tolerating therapy No specific other complaints or concerns Has not stooled yet   Objective    Interim History: Physical therapy recommends quad cane, weight bearing precautions are restricted to left upper extremity, shoulder sling immobilizer at all times  Telemetry: None   Objective: Filed Vitals:   02/25/13 0557 02/25/13 1115 02/25/13 1331 02/25/13 1334  BP: 156/86 109/52 92/45 95/49   Pulse: 95 92 96 98  Temp: 98.7 F (37.1 C) 98.6 F (37 C) 98 F (36.7 C)   TempSrc:  Oral Oral   Resp: 18 18 18    SpO2: 99% 98% 96%     Intake/Output Summary (Last 24 hours) at 02/25/13 1444 Last data filed at 02/25/13 1300  Gross per 24 hour  Intake 2693.33 ml  Output   1150 ml  Net 1543.33 ml    Exam:  General: EOMI, NCAT Cardiovascular: S1-S2 no murmur rub or gallop  Respiratory: Clinically clear Abdomen: Soft nontender  nondistended Skin left arm in a sling. Did not assess range of motion Neuro stable, moving all 4 extremities other than left upper extremity, pulses present  Data Reviewed: Basic Metabolic Panel:  Recent Labs Lab 02/22/13 1310 02/23/13 0601 02/24/13 1224  NA 133* 134* 133*  K 4.9 5.0 4.5  CL 96 98 98  CO2 27 26 23   GLUCOSE 117* 96 115*  BUN 22 19 18   CREATININE 0.67 0.66 0.54  CALCIUM 8.6 8.2* 8.3*   Liver Function Tests: No results found for this basename: AST, ALT, ALKPHOS, BILITOT, PROT, ALBUMIN,  in the last 168 hours No results found for this basename: LIPASE, AMYLASE,  in the last 168 hours No results found for this basename: AMMONIA,  in the last 168 hours CBC:  Recent Labs Lab 02/22/13 1310 02/23/13 0601 02/24/13 1224 02/25/13 0413  WBC 7.8 6.2 7.9 10.4  NEUTROABS 5.6  --  6.1  --   HGB 9.2* 8.4* 9.2* 9.8*  HCT 27.0* 24.7* 26.6* 29.0*  MCV 100.0 101.2* 100.4* 101.0*  PLT 252 229 234 267   Cardiac Enzymes: No results found for this basename: CKTOTAL, CKMB, CKMBINDEX, TROPONINI,  in the last 168 hours BNP: No components found with this basename: POCBNP,  CBG: No results found for this basename: GLUCAP,  in the last 168 hours  Recent Results (from the past 240 hour(s))  MRSA PCR SCREENING     Status: None   Collection Time    02/16/13  5:24 PM      Result Value Range Status   MRSA by PCR NEGATIVE  NEGATIVE Final  Comment:            The GeneXpert MRSA Assay (FDA     approved for NASAL specimens     only), is one component of a     comprehensive MRSA colonization     surveillance program. It is not     intended to diagnose MRSA     infection nor to guide or     monitor treatment for     MRSA infections.  SURGICAL PCR SCREEN     Status: None   Collection Time    02/24/13  5:50 AM      Result Value Range Status   MRSA, PCR NEGATIVE  NEGATIVE Final   Staphylococcus aureus NEGATIVE  NEGATIVE Final   Comment:            The Xpert SA Assay (FDA      approved for NASAL specimens     in patients over 49 years of age),     is one component of     a comprehensive surveillance     program.  Test performance has     been validated by [employer] for patients greater     than or equal to 7 year old.     It is not intended     to diagnose infection nor to     guide or monitor treatment.     Studies:              All Imaging reviewed and is as per above notation   Scheduled Meds: . aspirin EC  81 mg Oral Daily  . busPIRone  7.5 mg Oral BID  . calcium carbonate  1 tablet Oral Q breakfast  . docusate sodium  100 mg Oral BID  . feeding supplement (ENSURE COMPLETE)  237 mL Oral BID BM  . [START ON 02/26/2013] irbesartan  150 mg Oral Daily  . levothyroxine  50 mcg Oral QAC breakfast  . LORazepam  0.5 mg Oral BID  . mercaptopurine  25 mg Oral QAC breakfast  . mirtazapine  45 mg Oral QHS  . phenelzine  7.5 mg Oral BID   Continuous Infusions:     Assessment/Plan:  1. Proximal left humerus fracture,  S/p IM nailing left proximal humerus fracture 1/22-nonweightbearing left upper extremity, sling at all times- see PT recommendations please 2. Hypothyroidism-continue Synthroid 50 mcg.  TSH therapeutic. 3. Hypertension-continue irbesartan 300 mg daily-slightly hypotensive this morning. Cut back dosage to 150 mg daily 1/23 4. Borderline anemia, macrocytic vs dilutional-type and screened-no current indication for transfusion 5. Mild hyponatremia, euvolemic-IV fluids discontinued 1/23 as eating and drinking 6. Falls-potentially related to Ortho stasis.   Phenelzine 7.5 tid-has an additive theoretical  effect with ARB.  Start taper to BId today 7. Autoimmune hepatitis-continue mercaptopurine 25 mg every morning.  Outpatient followup needed with Dr. Christella Hartigan of GI 8. Depression-some therapeutic duplication with buspar 7.5 twice a day, Nardil 7.5 3 times a day [in the process of discontinuing/tapering as of 1/22], mirtazapine 45 mg each  bedtime, lorazepam 0.5 twice a day-will need to exercise caution with pain medications as this can potentiate the effects of this and cause confusion 9. Potential TIA in the past?-could have also been polypharmacy.  For now continue aspirin 81 daily 10. Impaired glucose tolerance-monitor CBG's-these have been stable  Code Status: Full Family Communication: On Disposition Plan: Inpatient pending surgery   Pleas Koch, MD  [facility] Pager [phone] 02/25/2013, 2:44 PM  LOS: 3 days

## 2013-02-25 NOTE — Clinical Social Work Psychosocial (Signed)
Clinical Social Work Department  BRIEF PSYCHOSOCIAL ASSESSMENT  02/17/2013  Patient: Marisa Smith,Marisa Smith Account Number: [id number] Admit date: 02/22/13 Clinical Social Worker: Harless NakayamaAMBELAL,POONUM, LCSWA Date/Time: 02/24/2013 03:00 PM  Referred by: Physician Date Referred: 02/17/2013  Referred for   SNF Placement   Other Referral:  Interview type: Family- PAtient's daughter  Other interview type:  Spoke with pt but pt not oriented. Called and spoke with pt daughter   PSYCHOSOCIAL DATA  Living Status: ALONE  Admitted from facility:  Level of care:  Primary support name: Cira ServantCarol Smith [phone]  Primary support relationship to patient: CHILD, ADULT  Degree of support available:  Pt has supportive family   CURRENT CONCERNS  Current Concerns   Post-Acute Placement   Other Concerns:  SOCIAL WORK ASSESSMENT / PLAN  CSW visited pt room to speak about ST rehab. However, pt family not present and pt not oriented. CSW called pt daughter and explained recommendation for SNF. Pt daughter aware and agreeable. Pt's daughter does not want the patient to return to [facility] due to the distance. [facility]'s [facility]    Assessment/plan status: Psychosocial Support/Ongoing Assessment of Needs  Other assessment/ plan:  Information/referral to community resources:  None Needed  PATIENT'S/FAMILY'S RESPONSE TO PLAN OF CARE:  Pt family agreeable to SNF. Patient's daughter expressed that she would like to be closer to her mother and [facility]. [facility]   Poonum Ambelal, LCSWA  [phone]

## 2013-02-25 NOTE — Clinical Social Work Placement (Signed)
Clinical Social Work Department  CLINICAL SOCIAL WORK PLACEMENT NOTE   Patient: 02/25/2013 Account Number:  [id number] Admit date: .02/23/13 Clinical Social Worker: Sabino NiemannAmy Ark Agrusa LCSWA Date/time: 02/24/13 11:30 AM  Clinical Social Work is seeking post-discharge placement for this patient at the following level of care: SKILLED NURSING (*CSW will update this form in Epic as items are completed)  02/25/2013 [facility]/family provided with [facility] Department of Clinical Social Work's list of facilities offering this level of care within the geographic area requested by the patient (or if unable, by the patient's family).  02/25/2013 Patient/family informed of their freedom to choose among providers that offer the needed level of care, that participate in Medicare, Medicaid or managed care program needed by the patient, have an available bed and are willing to accept the patient.  02/25/2013 Patient/family informed of [facility]' ownership interest in [facility], as well as of the fact that they are under no obligation to receive care at this facility.  PASARR submitted to EDS on pre-existing  PASARR number received from EDS on  FL2 transmitted to all facilities in geographic area requested by pt/family on 02/25/2013- to [facility] FL[facility] transmitted to all facilities within larger geographic area on  Patient informed that his/her managed care company has contracts with or will negotiate with certain facilities, including the following:  Patient/family informed of bed offers received:  Patient chooses bed at  Physician recommends and patient chooses bed at  Patient to be transferred to on  Patient to be transferred to facility by  The following physician request were entered in Epic:  Additional Comments:  [employer] clinicals submitted

## 2013-02-25 NOTE — Evaluation (Signed)
Occupational Therapy Evaluation Patient Details Name: Marisa Smith MRN: [id number] DOB: [birth year] Today's Date: 02/25/2013 Time: 1205-1220 OT Time Calculation (min): 15 min  OT Assessment / Plan / Recommendation History of present illness Patient is 80 year old female with history of anxiety, depression, hypertension, hypothyroidism, osteoporosis who was recently discharged to SNF on 02/18/13 s/p ORIF of left distal radial fracture. She had closed manipulation of the left shoulder by Dr. Orlan Leavens under anesthesia on 02/17/13. She had a fall 6 days ago when she sustained fractured proximal humerus and open distal radius fracture. During the follow up appointment with Dr. Orlan Leavens, it was determined that the proximal humerus fracture is displaced and will need a surgical fixation. Patient was a scheduled to have appointment with Dr. Ave Filter on 02/23/13 and surgery scheduled for 02/24/13. However per patient and her family the left shoulder pain was not well controlled with oral medications. They brought her back to the ER for possible earlier surgery and better pain control. Pt. underwent (L)  IM nail of proximal humerus fx. on 02/24/13.   Clinical Impression   Pt admitted with above. Will continue to follow pt acutely in order to address below problem list. Recommend return to SNF for d/c planning.    OT Assessment  Patient needs continued OT Services    Follow Up Recommendations  SNF    Barriers to Discharge      Equipment Recommendations   (TBD next venue)    Recommendations for Other Services    Frequency  Min 2X/week    Precautions / Restrictions Precautions Precautions: Fall;Shoulder Type of Shoulder Precautions: sling Shoulder Interventions: Shoulder sling/immobilizer;At all times Precaution Booklet Issued: No Required Braces or Orthoses: Sling Restrictions Weight Bearing Restrictions: Yes LUE Weight Bearing: Non weight bearing   Pertinent Vitals/Pain See vitals    ADL   Eating/Feeding: Performed;Set up Where Assessed - Eating/Feeding: Chair Upper Body Dressing: Maximal assistance;Performed Where Assessed - Upper Body Dressing: Supported sitting Toilet Transfer: Simulated;Moderate assistance Toilet Transfer Method: Sit to Barista:  (chair) Equipment Used: Gait belt (sling) Transfers/Ambulation Related to ADLs: mod A for sit<>stand from chair ADL Comments: Pt eating lunch up in chair.  Assisted pt with sit<>stand in order to reposition (slouched down in chair) and repositioned sling on LUE. Pt somewhat labile during session stating "these have been the worst days of my life".    OT Diagnosis: Generalized weakness;Acute pain  OT Problem List: Decreased strength;Decreased activity tolerance;Impaired balance (sitting and/or standing);Decreased knowledge of precautions;Decreased knowledge of use of DME or AE;Pain;Impaired UE functional use OT Treatment Interventions: Self-care/ADL training;DME and/or AE instruction;Therapeutic activities;Patient/family education;Balance training   OT Goals(Current goals can be found in the care plan section) Acute Rehab OT Goals Patient Stated Goal: rehab then home OT Goal Formulation: With patient Time For Goal Achievement: 03/04/13 Potential to Achieve Goals: Good  Visit Information  Last OT Received On: 02/25/13 Assistance Needed: +1 History of Present Illness: Patient is 80 year old female with history of anxiety, depression, hypertension, hypothyroidism, osteoporosis who was recently discharged to SNF on 02/18/13 s/p ORIF of left distal radial fracture. She had closed manipulation of the left shoulder by Dr. Orlan Leavens under anesthesia on 02/17/13. She had a fall 6 days ago when she sustained fractured proximal humerus and open distal radius fracture. During the follow up appointment with Dr. Orlan Leavens, it was determined that the proximal humerus fracture is displaced and will need a surgical fixation. Patient  was a scheduled to have appointment with Dr. Ave Filter on 02/23/13 and  surgery scheduled for 02/24/13. However per patient and her family the left shoulder pain was not well controlled with oral medications. They brought her back to the ER for possible earlier surgery and better pain control. Pt. underwent (L)  IM nail of proximal humerus fx. on 02/24/13.       Prior Functioning     Home Living Family/patient expects to be discharged to:: Skilled nursing facility Living Arrangements: Other (Comment) (recently at SNF) Additional Comments: [facility], was limving alone PTA as her husband is in a facility by her report Prior Function Level of Independence: Independent Comments: prior to first admission  Communication Communication: No difficulties Dominant Hand: Right         Vision/Perception     Cognition  Cognition Arousal/Alertness: Awake/alert Behavior During Therapy: Anxious Overall Cognitive Status: No family/caregiver present to determine baseline cognitive functioning Memory: Decreased short-term memory    Extremity/Trunk Assessment Upper Extremity Assessment Upper Extremity Assessment: LUE deficits/detail LUE Deficits / Details: ORIF forearm, humeral IM nail,.  LUE: Unable to fully assess due to immobilization Lower Extremity Assessment Lower Extremity Assessment: Generalized weakness Cervical / Trunk Assessment Cervical / Trunk Assessment: Normal     Mobility Bed Mobility Overal bed mobility:  (pt up in chair) Transfers Overall transfer level: Needs assistance Equipment used: 1 person hand held assist Transfers: Sit to/from Stand Sit to Stand: Mod assist General transfer comment: Pt. needed mod assist to rise to stand and min assist to control descent to chair.  Pt. with moderate posterior lean initially     Exercise     Balance Balance Overall balance assessment: Needs assistance Standing balance support: Single extremity supported (R UE at  sink) Standing balance-Leahy Scale: Poor Standing balance comment: Pt. leans posteriorly in standing while brushing teeth at sink x 5 minutes   End of Session OT - End of Session Equipment Utilized During Treatment:  (L UE sling) Activity Tolerance: Patient limited by fatigue;Patient limited by pain Patient left: in chair;with call bell/phone within reach  GO    02/25/2013 Cipriano MileJohnson, Rosie Golson Elizabeth OTR/L Pager [phone] Office [phone]  Cipriano MileJohnson, Reymundo Winship Elizabeth 02/25/2013, 2:30 PM

## 2013-02-25 NOTE — Progress Notes (Signed)
PATIENT ID: Marisa MoosAnnie Baltazar   1 Day Post-Op Procedure(s) (LRB): INTRAMEDULLARY (IM) NAIL LEFT PROXIMAL  HUMERUS (Left)  Subjective: Reports left shoulder is sore. Tolerating sling well. Having some difficulty with feeding herself with right arm.   Objective:  Filed Vitals:   02/25/13 0557  BP: 156/86  Pulse: 95  Temp: 98.7 F (37.1 C)  Resp: 18     Awake, alert, orientated L UE dressing c/d/ i Sling in position  Wiggles fingers, mod swelling  Short arm cast intact Distally NVI  Labs:   Recent Labs  02/22/13 1310 02/23/13 0601 02/24/13 1224 02/25/13 0413  HGB 9.2* 8.4* 9.2* 9.8*   Recent Labs  02/24/13 1224 02/25/13 0413  WBC 7.9 10.4  RBC 2.65* 2.87*  HCT 26.6* 29.0*  PLT 234 267   Recent Labs  02/23/13 0601 02/24/13 1224  NA 134* 133*  K 5.0 4.5  CL 98 98  CO2 26 23  BUN 19 18  CREATININE 0.66 0.54  GLUCOSE 96 115*  CALCIUM 8.2* 8.3*    Assessment and Plan: 1 day s/p left IM humeral nail  Will keep over the weekend to work on ADLs/PT/OT before SNF placement back to [facility] Continue current pain mgmt Sling full time Medicine team following as well and we appreciate their help in management   VTE proph: ASA 81 mg daily, SCDs

## 2013-02-26 LAB — CBC
HEMATOCRIT: 24.6 % — AB (ref 36.0–46.0)
HEMOGLOBIN: 8.4 g/dL — AB (ref 12.0–15.0)
MCH: 33.9 pg (ref 26.0–34.0)
MCHC: 34.1 g/dL (ref 30.0–36.0)
MCV: 99.2 fL (ref 78.0–100.0)
Platelets: 269 10*3/uL (ref 150–400)
RBC: 2.48 MIL/uL — ABNORMAL LOW (ref 3.87–5.11)
RDW: 14.1 % (ref 11.5–15.5)
WBC: 7.7 10*3/uL (ref 4.0–10.5)

## 2013-02-26 LAB — BASIC METABOLIC PANEL
BUN: 19 mg/dL (ref 6–23)
CHLORIDE: 99 meq/L (ref 96–112)
CO2: 23 mEq/L (ref 19–32)
CREATININE: 0.65 mg/dL (ref 0.50–1.10)
Calcium: 8 mg/dL — ABNORMAL LOW (ref 8.4–10.5)
GFR calc Af Amer: >90 mL/min (ref >90)
GFR calc non Af Amer: 78 mL/min — ABNORMAL LOW (ref >90)
Glucose, Bld: 121 mg/dL — ABNORMAL HIGH (ref 70–99)
POTASSIUM: 4.4 meq/L (ref 3.7–5.3)
Sodium: 133 mEq/L — ABNORMAL LOW (ref 137–147)

## 2013-02-26 MED ORDER — LACTULOSE 10 GM/15ML PO SOLN
20.0000 g | Freq: Two times a day (BID) | ORAL | Status: DC
  Administered 2013-02-26 – 2013-02-28 (×5): 20 g via ORAL
  Filled 2013-02-26 (×6): qty 30

## 2013-02-26 NOTE — Progress Notes (Signed)
Marisa Smith ZOX:[id number] DOB: Jun 15, 1924 DOA: 02/22/2013 PCP: Rene Paci, MD  Brief narrative: 80 y/o ?, known chronic hyponatremia, prediabetes, stable hypothyroidism, autoimmune hepatitis diagnosed 2007 foll by Dr. Johnney Killian mercaptopurine], recent complex Orthopedic h/o wherein patient fell, was found on the floor and underwent ORIF for L distal radial # + closed manipulation by Dr. Orlan Leavens.  Patient re-admitted from nursing facility by Dr. Kevan Ny as it was determined that Prox humerus was displaced and would need surgical fixation-surgery was scheduled for 1/22 however because patient was in intense pain, patient was brought to the emergency room 1/20 and admitted for pain control by hospitalist service Subsequently underwent IM nailing left proximal humerus 1/22  Past medical history-As per Problem list Chart reviewed as below-   Orthopedics-Dr. Ave Filter  Procedures:  IM nailing left occipital humerus 1/22  Antibiotics:  Perioperative clindamycin   Subjective  Doing fair. Not eating well Pain improved. Upset that her wound might be "leaking" Test is itchy Was to wash her hair and have a shower Do not feel dizzy on standing up today but felt scared of standing No chest pain No shortness of breath No fever No chills No stool as yet since admission   Objective    Interim History: Physical therapy recommends quad cane, weight bearing precautions are restricted to left upper extremity, shoulder sling immobilizer at all times  Telemetry: None   Objective: Filed Vitals:   02/25/13 1334 02/25/13 1502 02/25/13 1706 02/26/13 0605  BP: 95/49 109/46 98/48 99/50   Pulse: 98 97 95 96  Temp:   99.4 F (37.4 C) 98.9 F (37.2 C)  TempSrc:   Oral   Resp:   16 18  SpO2:  95% 96% 99%    Intake/Output Summary (Last 24 hours) at 02/26/13 1055 Last data filed at 02/26/13 0500  Gross per 24 hour  Intake    480 ml  Output      0 ml  Net    480 ml     Exam:  General: EOMI, NCAT Cardiovascular: S1-S2 no murmur rub or gallop  Respiratory: Clinically clear Abdomen: Soft nontender nondistended Skin left arm in a sling. Did not assess range of motion Neuro stable, moving all 4 extremities other than left upper extremity, pulses present  Data Reviewed: Basic Metabolic Panel:  Recent Labs Lab 02/22/13 1310 02/23/13 0601 02/24/13 1224 02/26/13 0515  NA 133* 134* 133* 133*  K 4.9 5.0 4.5 4.4  CL 96 98 98 99  CO2 27 26 23 23   GLUCOSE 117* 96 115* 121*  BUN 22 19 18 19   CREATININE 0.67 0.66 0.54 0.65  CALCIUM 8.6 8.2* 8.3* 8.0*   Liver Function Tests: No results found for this basename: AST, ALT, ALKPHOS, BILITOT, PROT, ALBUMIN,  in the last 168 hours No results found for this basename: LIPASE, AMYLASE,  in the last 168 hours No results found for this basename: AMMONIA,  in the last 168 hours CBC:  Recent Labs Lab 02/22/13 1310 02/23/13 0601 02/24/13 1224 02/25/13 0413 02/26/13 0515  WBC 7.8 6.2 7.9 10.4 7.7  NEUTROABS 5.6  --  6.1  --   --   HGB 9.2* 8.4* 9.2* 9.8* 8.4*  HCT 27.0* 24.7* 26.6* 29.0* 24.6*  MCV 100.0 101.2* 100.4* 101.0* 99.2  PLT 252 229 234 267 269   Cardiac Enzymes: No results found for this basename: CKTOTAL, CKMB, CKMBINDEX, TROPONINI,  in the last 168 hours BNP: No components found with this basename: POCBNP,  CBG: No  results found for this basename: GLUCAP,  in the last 168 hours  Recent Results (from the past 240 hour(s))  MRSA PCR SCREENING     Status: None   Collection Time    02/16/13  5:24 PM      Result Value Range Status   MRSA by PCR NEGATIVE  NEGATIVE Final   Comment:            The GeneXpert MRSA Assay (FDA     approved for NASAL specimens     only), is one component of a     comprehensive MRSA colonization     surveillance program. It is not     intended to diagnose MRSA     infection nor to guide or     monitor treatment for     MRSA infections.  SURGICAL PCR  SCREEN     Status: None   Collection Time    02/24/13  5:50 AM      Result Value Range Status   MRSA, PCR NEGATIVE  NEGATIVE Final   Staphylococcus aureus NEGATIVE  NEGATIVE Final   Comment:            The Xpert SA Assay (FDA     approved for NASAL specimens     in patients over 78 years of age),     is one component of     a comprehensive surveillance     program.  Test performance has     been validated by [facility] for patients greater     than or equal to 78 year old.     It is not intended     to diagnose infection nor to     guide or monitor treatment.     Studies:              All Imaging reviewed and is as per above notation   Scheduled Meds: . aspirin EC  81 mg Oral Daily  . busPIRone  7.5 mg Oral BID  . calcium carbonate  1 tablet Oral Q breakfast  . docusate sodium  100 mg Oral BID  . feeding supplement (ENSURE COMPLETE)  237 mL Oral BID BM  . irbesartan  150 mg Oral Daily  . lactulose  20 g Oral BID  . levothyroxine  50 mcg Oral QAC breakfast  . LORazepam  0.5 mg Oral BID  . mercaptopurine  25 mg Oral QAC breakfast  . mirtazapine  45 mg Oral QHS  . phenelzine  7.5 mg Oral BID   Continuous Infusions:     Assessment/Plan:  1. Proximal left humerus fracture,  S/p IM nailing left proximal humerus fracture 1/22-nonweightbearing left upper extremity, sling at all times- see PT recommendations please--have requested that occupational therapy see her today 1/24 for range of motion exercises 2. Hypothyroidism-continue Synthroid 50 mcg.  TSH therapeutic. 3. Hypertension-continue irbesartan 300 mg daily-slightly hypotensive this morning. Cut back dosage to 150 mg daily 1/23-might also be related to IV pain medicine. 4. Borderline anemia, macrocytic vs dilutional-type and screened-no current indication for transfusion at present. Repeat CBC in the morning 5. Mild hyponatremia, euvolemic-IV fluids discontinued 1/23 as eating and drinking 6. Falls-potentially  related to Ortho stasis.   Phenelzine 7.5 tid-has an additive theoretical  effect with ARB.  Start taper to BId today-orthostatic vital signs requested 1/24 7. Autoimmune hepatitis-continue mercaptopurine 25 mg every morning.  Outpatient followup needed with Dr. Christella HartiganJacobs of GI 8. Depression-some therapeutic duplication with  buspar 7.5 twice a day, Nardil 7.5 3 times a day [in the process of discontinuing/tapering as of 1/22], mirtazapine 45 mg each bedtime, lorazepam 0.5 twice a day-will need to exercise caution with pain medications as this can potentiate the effects of this and cause confusion 9. Potential TIA in the past?-could have also been polypharmacy.  For now continue aspirin 81 daily 10. Impaired glucose tolerance-monitor CBG's-these have been stable  Code Status: Full Family Communication: None present currently Disposition Plan: Inpatient pending SNIF placement 02/28/13   Pleas Koch, MD  [facility] Pager [phone] 02/26/2013, 10:55 AM    LOS: 4 days

## 2013-02-26 NOTE — Progress Notes (Signed)
PATIENT ID: Marisa Smith   2 Days Post-Op Procedure(s) (LRB): INTRAMEDULLARY (IM) NAIL LEFT PROXIMAL  HUMERUS (Left)  Subjective: Reports left shoulder remains sore and swollen.  Tol PO diet  Objective:  Filed Vitals:   02/26/13 0605  BP: 99/50  Pulse: 96  Temp: 98.9 F (37.2 C)  Resp: 18     Awake, alert, orientated L UE dressing c/d/ i Sling in position  Intact LT sens R/M/U/Ax Abducts fingers, flexes them, extends thumb Digits puffy SAC intact  Labs:   Recent Labs  02/24/13 1224 02/25/13 0413 02/26/13 0515  HGB 9.2* 9.8* 8.4*    Recent Labs  02/25/13 0413 02/26/13 0515  WBC 10.4 7.7  RBC 2.87* 2.48*  HCT 29.0* 24.6*  PLT 267 269    Recent Labs  02/24/13 1224 02/26/13 0515  NA 133* 133*  K 4.5 4.4  CL 98 99  CO2 23 23  BUN 18 19  CREATININE 0.54 0.65  GLUCOSE 115* 121*  CALCIUM 8.3* 8.0*    Assessment and Plan: s/p left IM humeral nail 1-22 Will keep over the weekend to work on ADLs/PT/OT (really to focus on fingers) before SNF placement back to [facility] Continue current pain mgmt Sling full time Medicine team following as well and we appreciate their help in management   VTE proph: ASA 81 mg daily, SCDs

## 2013-02-27 LAB — CBC
HEMATOCRIT: 26.5 % — AB (ref 36.0–46.0)
Hemoglobin: 9 g/dL — ABNORMAL LOW (ref 12.0–15.0)
MCH: 34.2 pg — ABNORMAL HIGH (ref 26.0–34.0)
MCHC: 34 g/dL (ref 30.0–36.0)
MCV: 100.8 fL — AB (ref 78.0–100.0)
Platelets: 265 10*3/uL (ref 150–400)
RBC: 2.63 MIL/uL — ABNORMAL LOW (ref 3.87–5.11)
RDW: 14.4 % (ref 11.5–15.5)
WBC: 7.1 10*3/uL (ref 4.0–10.5)

## 2013-02-27 MED ORDER — SENNA 8.6 MG PO TABS
1.0000 | ORAL_TABLET | Freq: Two times a day (BID) | ORAL | Status: DC
  Administered 2013-02-27 – 2013-02-28 (×3): 8.6 mg via ORAL
  Filled 2013-02-27 (×4): qty 1

## 2013-02-27 NOTE — Progress Notes (Signed)
Marisa Smith WUJ:[id number] DOB: 11/12/1924 DOA: 02/22/2013 PCP: Rene Paci, MD  Brief narrative: 80 y/o ?, known chronic hyponatremia, prediabetes, stable hypothyroidism, autoimmune hepatitis diagnosed 2007 foll by Dr. Johnney Killian mercaptopurine], recent complex Orthopedic h/o wherein patient fell, was found on the floor and underwent ORIF for L distal radial # + closed manipulation by Dr. Orlan Leavens.  Patient re-admitted from nursing facility by Dr. Kevan Ny as it was determined that Prox humerus was displaced and would need surgical fixation-surgery was scheduled for 1/22 however because patient was in intense pain, patient was brought to the emergency room 1/20 and admitted for pain control by hospitalist service Subsequently underwent IM nailing left proximal humerus 1/22  Past medical history-As per Problem list Chart reviewed as below-   Orthopedics-Dr. Ave Filter  Procedures:  IM nailing left occipital humerus 1/22  Antibiotics:  Perioperative clindamycin   Subjective  Doing fair. Looks fine.  Tol diet a little better. Ambulatory but somewhat weak. Pain improved    Objective    Interim History: Physical therapy recommends quad cane, weight bearing precautions are restricted to left upper extremity, shoulder sling immobilizer at all times  Telemetry: None   Objective: Filed Vitals:   02/26/13 0605 02/26/13 1507 02/26/13 2109 02/27/13 0502  BP: 99/50 116/56 115/53 153/62  Pulse: 96 94 94 90  Temp: 98.9 F (37.2 C) 98.9 F (37.2 C) 98.3 F (36.8 C) 98.6 F (37 C)  TempSrc:  Oral Oral Oral  Resp: 18 18 16 14   SpO2: 99% 98% 97% 94%    Intake/Output Summary (Last 24 hours) at 02/27/13 0853 Last data filed at 02/27/13 0600  Gross per 24 hour  Intake    740 ml  Output      0 ml  Net    740 ml    Exam:  General: EOMI, NCAT Cardiovascular: S1-S2 no murmur rub or gallop  Respiratory: Clinically clear Abdomen: Soft nontender nondistended Skin left arm in a  sling. Did not assess range of motion Neuro stable, moving all 4 extremities other than left upper extremity, pulses present  Data Reviewed: Basic Metabolic Panel:  Recent Labs Lab 02/22/13 1310 02/23/13 0601 02/24/13 1224 02/26/13 0515  NA 133* 134* 133* 133*  K 4.9 5.0 4.5 4.4  CL 96 98 98 99  CO2 27 26 23 23   GLUCOSE 117* 96 115* 121*  BUN 22 19 18 19   CREATININE 0.67 0.66 0.54 0.65  CALCIUM 8.6 8.2* 8.3* 8.0*   Liver Function Tests: No results found for this basename: AST, ALT, ALKPHOS, BILITOT, PROT, ALBUMIN,  in the last 168 hours No results found for this basename: LIPASE, AMYLASE,  in the last 168 hours No results found for this basename: AMMONIA,  in the last 168 hours CBC:  Recent Labs Lab 02/22/13 1310 02/23/13 0601 02/24/13 1224 02/25/13 0413 02/26/13 0515 02/27/13 0607  WBC 7.8 6.2 7.9 10.4 7.7 7.1  NEUTROABS 5.6  --  6.1  --   --   --   HGB 9.2* 8.4* 9.2* 9.8* 8.4* 9.0*  HCT 27.0* 24.7* 26.6* 29.0* 24.6* 26.5*  MCV 100.0 101.2* 100.4* 101.0* 99.2 100.8*  PLT 252 229 234 267 269 265   Cardiac Enzymes: No results found for this basename: CKTOTAL, CKMB, CKMBINDEX, TROPONINI,  in the last 168 hours BNP: No components found with this basename: POCBNP,  CBG: No results found for this basename: GLUCAP,  in the last 168 hours  Recent Results (from the past 240 hour(s))  SURGICAL PCR  SCREEN     Status: None   Collection Time    02/24/13  5:50 AM      Result Value Range Status   MRSA, PCR NEGATIVE  NEGATIVE Final   Staphylococcus aureus NEGATIVE  NEGATIVE Final   Comment:            The Xpert SA Assay (FDA     approved for NASAL specimens     in patients over 78 years of age),     is one component of     a comprehensive surveillance     program.  Test performance has     been validated by [facility] for patients greater     than or equal to 78 year old.     It is not intended     to diagnose infection nor to     guide or monitor treatment.       Studies:              All Imaging reviewed and is as per above notation   Scheduled Meds: . aspirin EC  81 mg Oral Daily  . busPIRone  7.5 mg Oral BID  . calcium carbonate  1 tablet Oral Q breakfast  . docusate sodium  100 mg Oral BID  . feeding supplement (ENSURE COMPLETE)  237 mL Oral BID BM  . irbesartan  150 mg Oral Daily  . lactulose  20 g Oral BID  . levothyroxine  50 mcg Oral QAC breakfast  . LORazepam  0.5 mg Oral BID  . mercaptopurine  25 mg Oral QAC breakfast  . mirtazapine  45 mg Oral QHS  . phenelzine  7.5 mg Oral BID   Continuous Infusions:     Assessment/Plan:  1. Proximal left humerus fracture,  S/p IM nailing left proximal humerus fracture 1/22-nonweightbearing left upper extremity, sling at all times- see PT recommendations please--have requested that occupational therapy see her today 1/24 for range of motion exercises 2. Hypothyroidism-continue Synthroid 50 mcg.  TSH therapeutic. 3. Hypertension-continue irbesartan 300 mg daily-slightly hypotensive this morning. Cut back dosage to 150 mg daily 1/23-might also be related to IV pain medicine. 4. Borderline anemia, macrocytic vs dilutional-type and screened-no current indication for transfusion at present. Stop checking CBC 5. Mild hyponatremia, euvolemic-IV fluids discontinued 1/23 as eating and drinking 6. Falls-potentially related to Ortho stasis.   Phenelzine 7.5 tid-has an additive theoretical  effect with ARB.  Start taper to BId today-orthostatics neg 7. Autoimmune hepatitis-continue mercaptopurine 25 mg every morning.  Outpatient followup needed with Dr. Christella HartiganJacobs of GI 8. Depression-some therapeutic duplication with buspar 7.5 twice a day, Nardil 7.5 3 times a day [in the process of discontinuing/tapering as of 1/22], mirtazapine 45 mg each bedtime, lorazepam 0.5 twice a day-will need to exercise caution with pain medications as this can potentiate the effects of this and cause confusion 9. Potential TIA in  the past?-could have also been polypharmacy.  For now continue aspirin 81 daily 10. Impaired glucose tolerance-monitor CBG's-these have been stable  Code Status: Full Family Communication: None present currently Disposition Plan: Inpatient pending SNIF placement 02/28/13   Pleas KochJai Jaylynn Siefert, MD  [facility] Pager [phone] 02/27/2013, 8:53 AM    LOS: 5 days

## 2013-02-27 NOTE — Progress Notes (Signed)
PATIENT ID: Marisa MoosAnnie Smith   3 Days Post-Op Procedure(s) (LRB): INTRAMEDULLARY (IM) NAIL LEFT PROXIMAL  HUMERUS (Left)  Subjective: Reports left shoulder remains sore and swollen.  Tol PO diet, reports urinating a lot at night  No BM yet, Nursing has reported the SQ edema in the upper arm  Objective:  Filed Vitals:   02/27/13 0502  BP: 153/62  Pulse: 90  Temp: 98.6 F (37 C)  Resp: 14     Awake, alert, orientated L UE dressing c/d/ i--small dried spot on upper dressing Sling in position , generous SQ edema above cast, but cast not at all too tight. Intact LT sens R/M/U/Ax Abducts fingers, flexes them, extends thumb Digits puffy SAC intact  Labs:   Recent Labs  02/24/13 1224 02/25/13 0413 02/26/13 0515 02/27/13 0607  HGB 9.2* 9.8* 8.4* 9.0*    Recent Labs  02/26/13 0515 02/27/13 0607  WBC 7.7 7.1  RBC 2.48* 2.63*  HCT 24.6* 26.5*  PLT 269 265    Recent Labs  02/24/13 1224 02/26/13 0515  NA 133* 133*  K 4.5 4.4  CL 98 99  CO2 23 23  BUN 18 19  CREATININE 0.54 0.65  GLUCOSE 115* 121*  CALCIUM 8.3* 8.0*    Assessment and Plan: s/p left IM humeral nail 1-22 Will keep over the weekend to work on ADLs/PT/OT (really to focus on fingers) before SNF placement back to [facility] Continue current pain mgmt Sling full time--elevate hand but do not abduct shoulder yet VTE proph: ASA 81 mg daily, SCDs

## 2013-02-28 MED ORDER — DOCUSATE SODIUM 100 MG PO CAPS: 100.0000 mg | ORAL_CAPSULE | Freq: Three times a day (TID) | ORAL | Status: DC | PRN

## 2013-02-28 MED ORDER — HYDROCODONE-ACETAMINOPHEN 5-325 MG PO TABS: 1.0000 | ORAL_TABLET | ORAL | Status: DC | PRN

## 2013-02-28 MED ORDER — PHENELZINE SULFATE 15 MG PO TABS: 7.5000 mg | ORAL_TABLET | Freq: Two times a day (BID) | ORAL | Status: DC

## 2013-02-28 NOTE — Progress Notes (Signed)
[facility]: [facility] [place] per MD order. Pt transported by transporters at 2025. ETA in approximately 30 - 60 mins. Pt alert and oriented at discharge with no complaints of pain or SOB. Pts daughter and son will be notified of transfer. [facility] notified of patient transporting.

## 2013-02-28 NOTE — Discharge Summary (Signed)
Patient ID: Marisa Smith MRN: [id number] DOB/AGE: 02-26-1924 80 y.o.  Admit date: 02/22/2013 Discharge date: 02/28/2013  Admission Diagnoses:  Principal Problem:   Proximal left humerus fracture Active Problems:   HYPOTHYROIDISM   Unspecified vitamin D deficiency   HYPERTENSION, BENIGN ESSENTIAL   OSTEOPOROSIS   Radius fracture   Discharge Diagnoses:  Same  Past Medical History  Diagnosis Date  . Unspecified vitamin D deficiency   . Headache(784.0) 02/15/2010  . ALLERGIC RHINITIS CAUSE UNSPECIFIED   . ANXIETY   . DEPRESSION, MAJOR, MODERATE   . HYPERTENSION, BENIGN ESSENTIAL   . HYPOTHYROIDISM   . OSTEOPOROSIS   . Autoimmune hepatitis     on MP6, prev pred    Surgeries: Procedure(s): INTRAMEDULLARY (IM) NAIL LEFT PROXIMAL  HUMERUS on 02/22/2013 - 02/24/2013   Consultants: Treatment Team:  Mable Paris, MD  Discharged Condition: Improved  Hospital Course: Marisa Smith is an 80 y.o. female who was admitted 02/22/2013 for operative treatment ofProximal humerus fracture. Sustained prox humerus fx and distal radius fx after fall on 02/16/13. Distal radius fracture was repaired by Dr. Melvyn Novas and the proximal humerus was manipulated at that time under anesthesia in order to try to treat conservatively in a sling. During follow up with Dr. Melvyn Novas, it was determined that prox humerus fx was now displaced and surgical fixation was determined to be needed for best results. After pre-op clearance the patient was taken to the operating room on 02/22/2013 - 02/24/2013 and underwent  Procedure(s): INTRAMEDULLARY (IM) NAIL LEFT PROXIMAL  HUMERUS.    Patient was given perioperative antibiotics: Anti-infectives   Start     Dose/Rate Route Frequency Ordered Stop   02/24/13 2100  clindamycin (CLEOCIN) IVPB 600 mg     600 mg 100 mL/hr over 30 Minutes Intravenous Every 6 hours 02/24/13 1730 02/25/13 0927   02/24/13 0600  clindamycin (CLEOCIN) IVPB 900 mg     900 mg 100 mL/hr over 30  Minutes Intravenous On call to O.R. 02/23/13 1336 02/24/13 1550   02/24/13 0600  clindamycin (CLEOCIN) IVPB 900 mg  Status:  Discontinued     900 mg 100 mL/hr over 30 Minutes Intravenous On call to O.R. 02/23/13 1439 02/23/13 1440       Patient was given sequential compression devices, early ambulation, and ASA 81 mg daily to prevent DVT.  Patient benefited maximally from hospital stay and there were no complications.  She was kept > 2 nights for therapy for ADLs/PT/OT and qualified to d/c to SNF.  Recent vital signs: Patient Vitals for the past 24 hrs:  BP Temp Temp src Pulse Resp SpO2 Height Weight  02/28/13 0700 - - - - - - 5\' 6"  (1.676 m) 47.174 kg (104 lb)  02/28/13 0421 145/66 mmHg 97.6 F (36.4 C) - 83 17 98 % - -  02/27/13 2135 127/59 mmHg 98.2 F (36.8 C) Oral 88 16 98 % - -     Recent laboratory studies:  Recent Labs  02/26/13 0515 02/27/13 0607  WBC 7.7 7.1  HGB 8.4* 9.0*  HCT 24.6* 26.5*  PLT 269 265  NA 133*  --   K 4.4  --   CL 99  --   CO2 23  --   BUN 19  --   CREATININE 0.65  --   GLUCOSE 121*  --   CALCIUM 8.0*  --      Discharge Medications:     Medication List         aspirin EC  81 MG tablet  Take 81 mg by mouth daily.     busPIRone 7.5 MG tablet  Commonly known as:  BUSPAR  Take 7.5 mg by mouth 2 (two) times daily.     calcium gluconate 500 MG tablet  Take 500 mg by mouth daily.     docusate sodium 100 MG capsule  Commonly known as:  COLACE  Take 1 capsule (100 mg total) by mouth 3 (three) times daily as needed.     feeding supplement (ENSURE COMPLETE) Liqd  Take 237 mLs by mouth 2 (two) times daily between meals.     HYDROcodone-acetaminophen 5-325 MG per tablet  Commonly known as:  NORCO/VICODIN  Take 1-2 tablets by mouth every 4 (four) hours as needed for moderate pain or severe pain. 1 tablet for mild to moderate pain; 2 tablets for severe pain     levothyroxine 50 MCG tablet  Commonly known as:  SYNTHROID, LEVOTHROID  Take  50 mcg by mouth daily before breakfast.     LORazepam 0.5 MG tablet  Commonly known as:  ATIVAN  Take 1 tablet (0.5 mg total) by mouth 2 (two) times daily.     mercaptopurine 50 MG tablet  Commonly known as:  PURINETHOL  Take 25 mg by mouth daily. Give on an empty stomach 1 hour before or 2 hours after meals. Caution: Chemotherapy.     mirtazapine 45 MG tablet  Commonly known as:  REMERON  Take 1 tablet (45 mg total) by mouth at bedtime.     olmesartan 40 MG tablet  Commonly known as:  BENICAR  Take 40 mg by mouth daily.     phenelzine 15 MG tablet  Commonly known as:  NARDIL  Take 0.5 tablets (7.5 mg total) by mouth 2 (two) times daily.     Vitamin D3 2000 UNITS Tabs  Take 2,000 Units by mouth daily.        Diagnostic Studies: Dg Wrist Complete Left  03/14/13   CLINICAL DATA:  Fall, bruising and swelling left wrist  EXAM: LEFT WRIST - COMPLETE 3+ VIEW  COMPARISON:  None  FINDINGS: Osseous demineralization.  Comminuted displaced distal left radial metaphyseal fracture with apex volar angulation.  Intra-articular extension at the radiocarpal joint and question the distal radioulnar joint.  Secondary ulnar plus variance.  Degenerative changes at distal pole scaphoid.  Associated soft tissue swelling at wrist.  Questionable tiny avulsion fracture at tip of ulnar styloid process.  No additional fracture, dislocation or bone destruction.  Scattered vascular calcifications.  IMPRESSION: Comminuted displaced and angulated distal left radial metaphyseal fracture with intra-articular extension at the radiocarpal and question distal radioulnar joints.  Suspect tiny avulsion fracture tip of ulnar styloid.   Electronically Signed   By: Ulyses Southward M.D.   On: 2013/03/14 10:20   Ct Head Wo Contrast  03-14-2013   CLINICAL DATA:  Status post fall, but able to control movements  EXAM: CT HEAD WITHOUT CONTRAST  CT CERVICAL SPINE WITHOUT CONTRAST  TECHNIQUE: Multidetector CT imaging of the head and  cervical spine was performed following the standard protocol without intravenous contrast. Multiplanar CT image reconstructions of the cervical spine were also generated.  COMPARISON:  MRI the brain of November 24, 2011 and CT scan of the brain of November 23, 2011.  FINDINGS: CT HEAD FINDINGS  There is mild diffuse cerebral and cerebellar atrophy with compensatory ventriculomegaly which has progressed slightly since the previous CT scan. There is decreased density in the deep white matter of  both cerebral hemispheres consistent with chronic small vessel ischemic type change. There is no acute intracranial hemorrhage nor evidence of an evolving ischemic infarction. The cerebellum and brainstem exhibit no acute abnormalities.  At bone window settings the observed portions of the paranasal sinuses and right mastoid air cells are clear. On the left there is some fluid within the mastoid air cells. There is no evidence of an acute skull fracture.  CT CERVICAL SPINE FINDINGS  The cervical vertebral bodies are preserved in height. There is disc space narrowing at C4-5 and to a lesser extent at C5-6 and C6-7. There are posterior endplate osteophytes at these levels. The prevertebral soft tissue spaces appear normal. There is no evidence of a perched facet or facet or spinous process fracture. The odontoid is intact and the lateral masses of C1 align normally with those of C2. The bony ring at each cervical level is intact. There is facet joint degenerative change at multiple levels.  The soft tissues of the neck exhibit no evidence of hematomas. The pulmonary apices exhibit fibrotic change but no pneumothorax. The observed portions of the first and second ribs appear intact.  IMPRESSION: 1. There is no evidence of an acute intracranial hemorrhage nor of an acute ischemic infarction. 2. There are age related white matter hypodensities consistent with chronic small vessel ischemic change. 3. There is no evidence of an acute  skull fracture. There is chronic opacification within left mastoid air cells which may reflect inflammation. 4. There are degenerative changes of the cervical spine but there is no evidence of an acute fracture nor dislocation.   Electronically Signed   By: David  [place]   On: 02/16/2013 11:40   Ct Cervical Spine Wo Contrast  02/16/2013   CLINICAL DATA:  Status post fall, but able to control movements  EXAM: CT HEAD WITHOUT CONTRAST  CT CERVICAL SPINE WITHOUT CONTRAST  TECHNIQUE: Multidetector CT imaging of the head and cervical spine was performed following the standard protocol without intravenous contrast. Multiplanar CT image reconstructions of the cervical spine were also generated.  COMPARISON:  MRI the brain of November 24, 2011 and CT scan of the brain of November 23, 2011.  FINDINGS: CT HEAD FINDINGS  There is mild diffuse cerebral and cerebellar atrophy with compensatory ventriculomegaly which has progressed slightly since the previous CT scan. There is decreased density in the deep white matter of both cerebral hemispheres consistent with chronic small vessel ischemic type change. There is no acute intracranial hemorrhage nor evidence of an evolving ischemic infarction. The cerebellum and brainstem exhibit no acute abnormalities.  At bone window settings the observed portions of the paranasal sinuses and right mastoid air cells are clear. On the left there is some fluid within the mastoid air cells. There is no evidence of an acute skull fracture.  CT CERVICAL SPINE FINDINGS  The cervical vertebral bodies are preserved in height. There is disc space narrowing at C4-5 and to a lesser extent at C5-6 and C6-7. There are posterior endplate osteophytes at these levels. The prevertebral soft tissue spaces appear normal. There is no evidence of a perched facet or facet or spinous process fracture. The odontoid is intact and the lateral masses of C1 align normally with those of C2. The bony ring at each cervical  level is intact. There is facet joint degenerative change at multiple levels.  The soft tissues of the neck exhibit no evidence of hematomas. The pulmonary apices exhibit fibrotic change but no pneumothorax. The observed portions  of the first and second ribs appear intact.  IMPRESSION: 1. There is no evidence of an acute intracranial hemorrhage nor of an acute ischemic infarction. 2. There are age related white matter hypodensities consistent with chronic small vessel ischemic change. 3. There is no evidence of an acute skull fracture. There is chronic opacification within left mastoid air cells which may reflect inflammation. 4. There are degenerative changes of the cervical spine but there is no evidence of an acute fracture nor dislocation.   Electronically Signed   By: David  [place]   On: 02/16/2013 11:40   Dg Chest Portable 1 View  02/16/2013   CLINICAL DATA:  Fall  EXAM: PORTABLE CHEST - 1 VIEW  COMPARISON:  None.  FINDINGS: Cardiomediastinal silhouette is unremarkable. Hyperinflation. Probable chronic mild interstitial prominence. No acute infiltrate or pulmonary edema.  IMPRESSION: Hyperinflation. No active disease. Probable chronic mild interstitial prominence.   Electronically Signed   By: Natasha Mead M.D.   On: 02/16/2013 13:14   Dg Shoulder Left  02/24/2013   CLINICAL DATA:  Fracture of the proximal left humerus.  EXAM: LEFT SHOULDER - 2+ VIEW; DG C-ARM 1-60 MIN  COMPARISON:  02/22/2013  FINDINGS: Two intraoperative images demonstrate internal fixation of the proximal left humerus with an intramedullary nail. There are 2 proximal and 2 distal interlocking screws. The distal tip of the nail is not visualized. Improved alignment of the proximal humerus with residual displacement. The left humeral head appears located on these two views.  IMPRESSION: Internal fixation of the proximal left humerus fracture.   Electronically Signed   By: Richarda Overlie M.D.   On: 02/24/2013 17:18   Dg Shoulder  Left  02/22/2013   CLINICAL DATA:  Larey Seat a few days ago.  Left shoulder pain.  EXAM: LEFT SHOULDER - 2+ VIEW  COMPARISON:  02/16/2013  FINDINGS: The displaced fracture of the proximal humeral metaphysis is again noted. The fractures transverse with minimal comminution. The shaft fracture fragment has displaced anteriorly and medially by full shaft width. It is less foreshortened than it was on the prior study, with approximately 5 mm of overlap. There is also less rotation between the humeral head and shaft, with no significant varus or valgus angulation persisting.  There is no new fracture.  There is no dislocation.  IMPRESSION: Fracture of the proximal left humerus as described, still displaced, but with less foreshortening and minimal residual angulation. No new abnormality.   Electronically Signed   By: Amie Portland M.D.   On: 02/22/2013 14:30   Dg Shoulder Left  02/16/2013   CLINICAL DATA:  Fall.  EXAM: LEFT SHOULDER - 2+ VIEW  COMPARISON:  None.  FINDINGS: Angulated fracture of the proximal left humerus at the diaphyseal metaphyseal junction is normal. Comminution is present. Prominent soft tissue swelling is present . No other focal abnormality identified  IMPRESSION: Angulated comminuted fracture of proximal left humerus.   Electronically Signed   By: Maisie Fus  Register   On: 02/16/2013 10:20   Dg C-arm 1-60 Min  02/24/2013   CLINICAL DATA:  Fracture of the proximal left humerus.  EXAM: LEFT SHOULDER - 2+ VIEW; DG C-ARM 1-60 MIN  COMPARISON:  02/22/2013  FINDINGS: Two intraoperative images demonstrate internal fixation of the proximal left humerus with an intramedullary nail. There are 2 proximal and 2 distal interlocking screws. The distal tip of the nail is not visualized. Improved alignment of the proximal humerus with residual displacement. The left humeral head appears located on these two  views.  IMPRESSION: Internal fixation of the proximal left humerus fracture.   Electronically Signed   By:  Richarda OverlieAdam  Henn M.D.   On: 02/24/2013 17:18    Disposition: 03-Skilled Nursing Facility      Discharge Orders   Future Appointments Provider Department Dept Phone   07/06/2013 10:45 AM Newt LukesValerie A Leschber, MD [facility] [phone]   Future Orders Complete By Expires   Call MD / Call 911  As directed    Comments:     If you experience chest pain or shortness of breath, CALL 911 and be transported to the hospital emergency room.  If you develope a fever above 101 F, pus (white drainage) or increased drainage or redness at the wound, or calf pain, call your surgeon's office.   Constipation Prevention  As directed    Comments:     Drink plenty of fluids.  Prune juice may be helpful.  You may use a stool softener, such as Colace (over the counter) 100 mg twice a day.  Use MiraLax (over the counter) for constipation as needed.   Diet - low sodium heart healthy  As directed    Increase activity slowly as tolerated  As directed       Follow-up Information   Follow up with Mable ParisHANDLER,JUSTIN WILLIAM, MD. Schedule an appointment as soon as possible for a visit in 2 weeks.   Specialty:  Orthopedic Surgery   Contact information:   [address] [place] [id number] [phone]        Signed: Jiles HaroldLALIBERTE, Donaldson Richter 02/28/2013, 2:34 PM

## 2013-02-28 NOTE — Progress Notes (Signed)
Occupational Therapy Treatment Patient Details Name: Marisa Smith MRN: [id number] DOB: 03/22/1924 Today's Date: 02/28/2013 Time: [id number] OT Time Calculation (min): 39 min  OT Assessment / Plan / Recommendation  History of present illness Patient is 80 year old female with history of anxiety, depression, hypertension, hypothyroidism, osteoporosis who was recently discharged to SNF on 02/18/13 s/p ORIF of left distal radial fracture. She had closed manipulation of the left shoulder by Dr. Orlan Leavensrtman under anesthesia on 02/17/13. She had a fall 6 days ago when she sustained fractured proximal humerus and open distal radius fracture. During the follow up appointment with Dr. Orlan Leavensrtman, it was determined that the proximal humerus fracture is displaced and will need a surgical fixation. Patient was a scheduled to have appointment with Dr. Ave Filterhandler on 02/23/13 and surgery scheduled for 02/24/13. However per patient and her family the left shoulder pain was not well controlled with oral medications. They brought her back to the ER for possible earlier surgery and better pain control. Pt. underwent (L)  IM nail of proximal humerus fx. on 02/24/13.   OT comments  Pt progressing toward acute OT goals. She requires Min A for functional transfers and Min-max A for ADL's secondary to non-weightbearing LUE. Pt labile during treatment session stating "this has been the worst time of my life." Cont to recommend SNF rehab.  Follow Up Recommendations  SNF;Supervision/Assistance - 24 hour    Barriers to Discharge       Equipment Recommendations  Other (comment) (Defer to next venue)    Recommendations for Other Services    Frequency Min 2X/week   Progress towards OT Goals Progress towards OT goals: Progressing toward goals  Plan Discharge plan remains appropriate    Precautions / Restrictions Precautions Precautions: Fall;Shoulder Type of Shoulder Precautions: sling Shoulder Interventions: Shoulder  sling/immobilizer;At all times Required Braces or Orthoses: Sling Restrictions Weight Bearing Restrictions: Yes LUE Weight Bearing: Non weight bearing   Pertinent Vitals/Pain Not rated. C/o fatigue and weakness during ADL's.    ADL  Grooming: Performed;Wash/dry hands;Wash/dry face;Teeth care;Brushing hair;Min guard Where Assessed - Grooming: Unsupported standing Upper Body Bathing: Performed;Minimal assistance Where Assessed - Upper Body Bathing: Unsupported sitting Lower Body Bathing: Performed;Moderate assistance Where Assessed - Lower Body Bathing: Supported sit to stand Upper Body Dressing: Performed;Maximal assistance Where Assessed - Upper Body Dressing: Unsupported sitting Lower Body Dressing: Performed;Maximal assistance Where Assessed - Lower Body Dressing: Supported sitting;Supported sit to stand Toilet Transfer: Simulated;Minimal assistance;Moderate assistance Toilet Transfer Method: Sit to stand Toilet Transfer Equipment: Raised toilet seat with arms (or 3-in-1 over toilet) Toileting - Clothing Manipulation and Hygiene: Moderate assistance Where Assessed - Toileting Clothing Manipulation and Hygiene: Sit to stand from 3-in-1 or toilet Equipment Used: Gait belt Transfers/Ambulation Related to ADLs: Pt ambulated w/ gait belt and hand held Min-Min guard assist, anxious, moves slowly. ADL Comments: Pt participated in ADL retraining session today grooming at sink (standing), UB/LB bathing/dressing sitting and standing. Pt required rest breaks x2. Verbal education & performance for AROM left hand and edema control. Pt is anxious, and labile during session "this has been the worst time of my life."    OT Diagnosis:    OT Problem List:   OT Treatment Interventions:     OT Goals(current goals can now be found in the care plan section) Acute Rehab OT Goals Patient Stated Goal: rehab then home  Visit Information  Last OT Received On: 02/28/13 Assistance Needed: +1 History of  Present Illness: Patient is 80 year old female with history of anxiety, depression, hypertension,  hypothyroidism, osteoporosis who was recently discharged to SNF on 02/18/13 s/p ORIF of left distal radial fracture. She had closed manipulation of the left shoulder by Dr. Orlan Leavens under anesthesia on 02/17/13. She had a fall 6 days ago when she sustained fractured proximal humerus and open distal radius fracture. During the follow up appointment with Dr. Orlan Leavens, it was determined that the proximal humerus fracture is displaced and will need a surgical fixation. Patient was a scheduled to have appointment with Dr. Ave Filter on 02/23/13 and surgery scheduled for 02/24/13. However per patient and her family the left shoulder pain was not well controlled with oral medications. They brought her back to the ER for possible earlier surgery and better pain control. Pt. underwent (L)  IM nail of proximal humerus fx. on 02/24/13.    Subjective Data      Prior Functioning       Cognition  Cognition Arousal/Alertness: Awake/alert Behavior During Therapy: WFL for tasks assessed/performed;Anxious Overall Cognitive Status: No family/caregiver present to determine baseline cognitive functioning Memory: Decreased short-term memory    Mobility  Bed Mobility General bed mobility comments: not addressed; pt up in recliner with nursing upon admission Transfers Overall transfer level: Needs assistance Equipment used: 1 person hand held assist Transfers: Sit to/from Stand Sit to Stand: Min assist Stand pivot transfers: Min assist General transfer comment: cues for hand placement.  (A) for balance.           Balance Balance Overall balance assessment: Needs assistance Postural control: Posterior lean Standing balance support: During functional activity Standing balance-Leahy Scale: Fair Standing balance comment: Pt leans posteriorly in standing at sink and from 3:1. Stood ~ 10 min for grooming tasks.  End of Session  OT - End of Session Equipment Utilized During Treatment: Gait belt;Other (comment) (L UE sling) Activity Tolerance: Patient limited by fatigue Patient left: in chair;with call bell/phone within reach Nurse Communication: Mobility status;Other (comment) (ADL performed in OT session)  GO     Alm Bustard 02/28/2013, 9:51 AM

## 2013-02-28 NOTE — Discharge Instructions (Signed)

## 2013-02-28 NOTE — Progress Notes (Signed)
Physical Therapy Treatment Patient Details Name: Marisa Smith MRN: [id number] DOB: 1924/06/07 Today's Date: 02/28/2013 Time: 0840-0900 PT Time Calculation (min): 20 min  PT Assessment / Plan / Recommendation  History of Present Illness Patient is 80 year old female with history of anxiety, depression, hypertension, hypothyroidism, osteoporosis who was recently discharged to SNF on 02/18/13 s/p ORIF of left distal radial fracture. She had closed manipulation of the left shoulder by Dr. Orlan Leavensrtman under anesthesia on 02/17/13. She had a fall 6 days ago when she sustained fractured proximal humerus and open distal radius fracture. During the follow up appointment with Dr. Orlan Leavensrtman, it was determined that the proximal humerus fracture is displaced and will need a surgical fixation. Patient was a scheduled to have appointment with Dr. Ave Filterhandler on 02/23/13 and surgery scheduled for 02/24/13. However per patient and her family the left shoulder pain was not well controlled with oral medications. They brought her back to the ER for possible earlier surgery and better pain control. Pt. underwent (L)  IM nail of proximal humerus fx. on 02/24/13.   PT Comments   Pt progressing with mobility as she required decreased assist for tasks however cont's to present with decreased strength, decreased balance, & decreased safety.       Follow Up Recommendations  SNF     Does the patient have the potential to tolerate intense rehabilitation     Barriers to Discharge        Equipment Recommendations       Recommendations for Other Services    Frequency Min 3X/week   Progress towards PT Goals Progress towards PT goals: Progressing toward goals  Plan Current plan remains appropriate    Precautions / Restrictions Precautions Precautions: Fall;Shoulder Type of Shoulder Precautions: sling Shoulder Interventions: Shoulder sling/immobilizer;At all times Required Braces or Orthoses: Sling Restrictions Weight Bearing  Restrictions: Yes LUE Weight Bearing: Non weight bearing   Pertinent Vitals/Pain C/o Lt hip/LE pain more than she did LUE.  RN notified.  Repositioned for comfort.      Mobility  Transfers Overall transfer level: Needs assistance Equipment used: 1 person hand held assist Transfers: Sit to/from Stand Sit to Stand: Min assist General transfer comment: cues for hand placement.  (A) for balance.   Ambulation/Gait Ambulation/Gait assistance: Min assist Ambulation Distance (Feet): 120 Feet Assistive device: 1 person hand held assist Gait Pattern/deviations: Step-through pattern;Decreased stride length;Narrow base of support Gait velocity: decreased General Gait Details: (A) provided via Rt HHA & around waist for stability.        PT Goals (current goals can now be found in the care plan section) Acute Rehab PT Goals Patient Stated Goal: rehab then home PT Goal Formulation: With patient Time For Goal Achievement: 03/04/13 Potential to Achieve Goals: Good  Visit Information  Last PT Received On: 02/28/13 Assistance Needed: +1 History of Present Illness: Patient is 80 year old female with history of anxiety, depression, hypertension, hypothyroidism, osteoporosis who was recently discharged to SNF on 02/18/13 s/p ORIF of left distal radial fracture. She had closed manipulation of the left shoulder by Dr. Orlan Leavensrtman under anesthesia on 02/17/13. She had a fall 6 days ago when she sustained fractured proximal humerus and open distal radius fracture. During the follow up appointment with Dr. Orlan Leavensrtman, it was determined that the proximal humerus fracture is displaced and will need a surgical fixation. Patient was a scheduled to have appointment with Dr. Ave Filterhandler on 02/23/13 and surgery scheduled for 02/24/13. However per patient and her family the left shoulder pain was not  well controlled with oral medications. They brought her back to the ER for possible earlier surgery and better pain control. Pt.  underwent (L)  IM nail of proximal humerus fx. on 02/24/13.    Subjective Data  Patient Stated Goal: rehab then home   Cognition  Cognition Arousal/Alertness: Awake/alert Behavior During Therapy: WFL for tasks assessed/performed Overall Cognitive Status: No family/caregiver present to determine baseline cognitive functioning Memory: Decreased short-term memory    Balance  Balance Overall balance assessment: Needs assistance Standing balance support: During functional activity Standing balance-Leahy Scale: Fair  End of Session PT - End of Session Activity Tolerance: Patient tolerated treatment well Patient left: in chair;with call bell/phone within reach Nurse Communication: Mobility status   GP     Marisa Smith 02/28/2013, 9:08 AM   Marisa Smith, PTA [phone] 02/28/2013

## 2013-02-28 NOTE — Progress Notes (Signed)
Patient reviewed. Note plans for d/c to SNF Will s/o. Thanks for consult.  Pleas KochJai Arienne Gartin, MD [facility] [phone]) [phone]

## 2013-02-28 NOTE — Progress Notes (Signed)
PATIENT ID: Marisa MoosAnnie Smith   4 Days Post-Op Procedure(s) (LRB): INTRAMEDULLARY (IM) NAIL LEFT PROXIMAL  HUMERUS (Left)  Subjective: Sitting up in chair, reports doing well this am. L shoulder soreness. Had BM this am. No other complaints or concerns.  Objective:  Filed Vitals:   02/28/13 0421  BP: 145/66  Pulse: 83  Temp: 97.6 F (36.4 C)  Resp: 17     Awake, alert, orienated L UE dressing removed today, incisions benign, steris intact Some improvement in swelling L UE and left fingers Wiggles fingers, distally NVI  Labs:   Recent Labs  02/26/13 0515 02/27/13 0607  HGB 8.4* 9.0*   Recent Labs  02/26/13 0515 02/27/13 0607  WBC 7.7 7.1  RBC 2.48* 2.63*  HCT 24.6* 26.5*  PLT 269 265   Recent Labs  02/26/13 0515  NA 133*  K 4.4  CL 99  CO2 23  BUN 19  CREATININE 0.65  GLUCOSE 121*  CALCIUM 8.0*    Assessment and Plan: S/p left IM humeral nail 1/22  Did well the weekend with pt/ot with improvement on L UE swelling. Improvement with ADLs and is feeding herself independently Okay to d/c to SNF today if medically stable, discharge order in norco for pain control, script in chart Sling full time, wiggle fingers and elevate without abduction of shoulder  VTE proph: ASA 81 mg daily, SCDs

## 2013-03-01 ENCOUNTER — Encounter (HOSPITAL_COMMUNITY): Payer: Self-pay | Admitting: Orthopedic Surgery

## 2013-04-14 ENCOUNTER — Telehealth: Payer: Self-pay | Admitting: *Deleted

## 2013-04-14 NOTE — Telephone Encounter (Signed)
Darlene, RN with home health, phoned requesting verbal orders for skilled nursing, PT, OT, and ST.  Please advise.  CB# [phone]

## 2013-04-15 NOTE — Telephone Encounter (Signed)
verbal ok as requested

## 2013-04-15 NOTE — Telephone Encounter (Signed)
Phoned [facility] home health RN, Agustin CreeDarlene and left verbal order per MD request.

## 2013-04-19 ENCOUNTER — Telehealth: Payer: Self-pay | Admitting: *Deleted

## 2013-04-19 NOTE — Telephone Encounter (Signed)
[facility] to inform MD per pts family pt will have only one home visit this week due to appoints.  No action needed, just [facility]

## 2013-04-25 DIAGNOSIS — IMO0001 Reserved for inherently not codable concepts without codable children: Secondary | ICD-10-CM

## 2013-04-25 DIAGNOSIS — R413 Other amnesia: Secondary | ICD-10-CM

## 2013-04-25 DIAGNOSIS — M2550 Pain in unspecified joint: Secondary | ICD-10-CM

## 2013-04-25 DIAGNOSIS — J159 Unspecified bacterial pneumonia: Secondary | ICD-10-CM

## 2013-04-25 DIAGNOSIS — R269 Unspecified abnormalities of gait and mobility: Secondary | ICD-10-CM

## 2013-04-27 ENCOUNTER — Telehealth: Payer: Self-pay

## 2013-04-27 NOTE — Telephone Encounter (Signed)
Vernona RiegerLaura return call back wanted to get orders fot PT. Per lsst msg 04/14/13 md stated it was ok...Raechel Chute/lmb

## 2013-04-27 NOTE — Telephone Encounter (Signed)
Called Vernona RiegerLaura no answer LMOM RTC...Raechel Chute/lmb

## 2013-04-27 NOTE — Telephone Encounter (Signed)
The home health rn called and is hoping to speak with a CMA directly about the pt.   Callback  - [phone] Vernona Rieger(Laura)

## 2013-05-03 ENCOUNTER — Encounter: Payer: Self-pay | Admitting: Internal Medicine

## 2013-05-03 ENCOUNTER — Ambulatory Visit (INDEPENDENT_AMBULATORY_CARE_PROVIDER_SITE_OTHER): Payer: Medicare Other | Admitting: Internal Medicine

## 2013-05-03 VITALS — BP 122/78 | HR 79 | Temp 97.9°F | Wt 104.1 lb

## 2013-05-03 DIAGNOSIS — M810 Age-related osteoporosis without current pathological fracture: Secondary | ICD-10-CM

## 2013-05-03 DIAGNOSIS — F411 Generalized anxiety disorder: Secondary | ICD-10-CM

## 2013-05-03 DIAGNOSIS — I10 Essential (primary) hypertension: Secondary | ICD-10-CM

## 2013-05-03 DIAGNOSIS — H6120 Impacted cerumen, unspecified ear: Secondary | ICD-10-CM

## 2013-05-03 MED ORDER — BUSPIRONE HCL 7.5 MG PO TABS: 7.5000 mg | ORAL_TABLET | Freq: Two times a day (BID) | ORAL | Status: DC

## 2013-05-03 MED ORDER — MIRTAZAPINE 45 MG PO TABS: 45.0000 mg | ORAL_TABLET | Freq: Every day | ORAL | Status: DC

## 2013-05-03 MED ORDER — MERCAPTOPURINE 50 MG PO TABS: 25.0000 mg | ORAL_TABLET | Freq: Every day | ORAL | Status: DC

## 2013-05-03 MED ORDER — OLMESARTAN MEDOXOMIL 40 MG PO TABS: 40.0000 mg | ORAL_TABLET | Freq: Every day | ORAL | Status: DC

## 2013-05-03 MED ORDER — LEVOTHYROXINE SODIUM 50 MCG PO TABS: 50.0000 ug | ORAL_TABLET | Freq: Every day | ORAL | Status: AC

## 2013-05-03 NOTE — Assessment & Plan Note (Signed)
BP Readings from Last 3 Encounters:  05/03/13 122/78  02/28/13 145/66  02/28/13 145/66   10/13 meds changed due bradycardia - stopped atenolol and lisinopril Started Benicar late 2013, at max dose - no adverse symptoms - hx hyponatremia (exac by hctz in past) - Last labs reviewed The current medical regimen is effective;  continue present plan and medications.

## 2013-05-03 NOTE — Progress Notes (Signed)
Subjective:    Patient ID: Marisa Smith, female    DOB: 08-21-1924, 80 y.o.   MRN: [id number]  HPI  Patient is here for follow up  Reviewed chronic medical issues and interval medical events  Past Medical History  Diagnosis Date  . Unspecified vitamin D deficiency   . Headache(784.0) 02/15/2010  . ALLERGIC RHINITIS CAUSE UNSPECIFIED   . ANXIETY   . DEPRESSION, MAJOR, MODERATE   . HYPERTENSION, BENIGN ESSENTIAL   . HYPOTHYROIDISM   . OSTEOPOROSIS   . Autoimmune hepatitis     on MP6, prev pred    Review of Systems  Constitutional: Positive for fatigue. Negative for unexpected weight change.  Respiratory: Negative for cough and shortness of breath.   Cardiovascular: Negative for chest pain and leg swelling.  Musculoskeletal: Negative for back pain and gait problem.  Neurological: Positive for weakness (generalized).  Psychiatric/Behavioral: Positive for dysphoric mood (grief at death of husband). Negative for suicidal ideas and self-injury. The patient is nervous/anxious.        Objective:   Physical Exam  BP 122/78  Pulse 79  Temp(Src) 97.9 F (36.6 C) (Oral)  Wt 104 lb 1.9 oz (47.229 kg)  SpO2 99% Wt Readings from Last 3 Encounters:  05/03/13 104 lb 1.9 oz (47.229 kg)  02/28/13 104 lb (47.174 kg)  02/28/13 104 lb (47.174 kg)   Constitutional: She appears well-developed and well-nourished. No distress. Dtr at side HENT: NCAT, Bilateral soft cerumen impaction, after occasion, TMs clear without effusion or erythema. Neck: Normal range of motion. Neck supple. No JVD present. No thyromegaly present.  Cardiovascular: Normal rate, regular rhythm and normal heart sounds.  No murmur heard. No BLE edema. Pulmonary/Chest: Effort normal and breath sounds normal. No respiratory distress. She has no wheezes.  MSkel: well-healed scarring of her left proximal humerus and distal radius on left side -no gross deformities. Full range of motion without significant pain Psychiatric:  She has a dysphoric but calm mood and affect. Her behavior is normal. Judgment and thought content normal.   Lab Results  Component Value Date   WBC 7.1 02/27/2013   HGB 9.0* 02/27/2013   HCT 26.5* 02/27/2013   PLT 265 02/27/2013   GLUCOSE 121* 02/26/2013   CHOL 168 11/24/2011   TRIG 33 11/24/2011   HDL 86 11/24/2011   LDLCALC 75 11/24/2011   ALT 18 01/14/2013   AST 26 01/14/2013   NA 133* 02/26/2013   K 4.4 02/26/2013   CL 99 02/26/2013   CREATININE 0.65 02/26/2013   BUN 19 02/26/2013   CO2 23 02/26/2013   TSH 3.588 02/23/2013   INR 0.99 02/24/2013   HGBA1C 6.2 01/04/2013    Dg Shoulder Left  02/22/2013   CLINICAL DATA:  Larey SeatFell a few days ago.  Left shoulder pain.  EXAM: LEFT SHOULDER - 2+ VIEW  COMPARISON:  02/16/2013  FINDINGS: The displaced fracture of the proximal humeral metaphysis is again noted. The fractures transverse with minimal comminution. The shaft fracture fragment has displaced anteriorly and medially by full shaft width. It is less foreshortened than it was on the prior study, with approximately 5 mm of overlap. There is also less rotation between the humeral head and shaft, with no significant varus or valgus angulation persisting.  There is no new fracture.  There is no dislocation.  IMPRESSION: Fracture of the proximal left humerus as described, still displaced, but with less foreshortening and minimal residual angulation. No new abnormality.   Electronically Signed   By: Onalee Huaavid  Ormond M.D.   On: 02/22/2013 14:30    Procedure: wax removal Reason: wax impaction Risks and benefits of procedure discussed with the patient who agrees to proceed. Ear(s) irrigated with warm water. Large amount of wax removed. Instrumentation with metal ear loop was performed to accomplish wax removal. the patient tolerated procedure well.     Assessment & Plan:   Cerumen impaction, bilateral. Irrigation as noted above today. Patient tolerated well. Patient will call if continued hearing problems or  recurrent symptoms   Problem List Items Addressed This Visit   ANXIETY     Severe symptoms with depression overlap Follows with psyc (plovsky) for same, continue efforts at behav therapy wax/wane flares, exacerbated by grief following death of husband early 06-14-2013, but also: And weighs no longer fearing "hiw it will end" current med combination reviewed and updated advised to consider the lorazepam as "BP" control medication -    HYPERTENSION, BENIGN ESSENTIAL      BP Readings from Last 3 Encounters:  05/03/13 122/78  02/28/13 145/66  02/28/13 145/66   10/13 meds changed due bradycardia - stopped atenolol and lisinopril Started Benicar late 06-15-11, at max dose - no adverse symptoms - hx hyponatremia (exac by hctz in past) - Last labs reviewed The current medical regimen is effective;  continue present plan and medications.     OSTEOPOROSIS - Primary     prior chronic Fosamax therapy, discontinued 08/2012 same at this time because of complaints of hoarseness Accidental fall with subsequent fracture of left proximal humerus and distal radius fracture January 2015, both for requiring ORIF repair Interval history reviewed, encouraged compliance with calcium and vitamin D but no other osteoporosis therapy recommended at this time     Other Visit Diagnoses   Cerumen impaction          Time spent with pt/family today 25 minutes, greater than 50% time spent counseling patient on fracture repair and osteoporosis, anxiety with depression complicated by grief and medication review. Also review of prior records

## 2013-05-03 NOTE — Patient Instructions (Signed)
It was good to see you today.  I am sorry for your loss. Please let me know how/if I can help  We have reviewed your prior records including labs and tests today  Medications reviewed and updated, no changes recommended at this time. Refill on medication(s) as discussed today.  Your ears have been irrigated of wax today -let us know if continued hearing problems persist for referral to audiologist and hearing testing  Please schedule followup in 3-4 months, call sooner if problems.

## 2013-05-03 NOTE — Assessment & Plan Note (Signed)
prior chronic Fosamax therapy, discontinued 08/2012 same at this time because of complaints of hoarseness Accidental fall with subsequent fracture of left proximal humerus and distal radius fracture January 2015, both for requiring ORIF repair Interval history reviewed, encouraged compliance with calcium and vitamin D but no other osteoporosis therapy recommended at this time 

## 2013-05-03 NOTE — Assessment & Plan Note (Signed)
Severe symptoms with depression overlap Follows with psyc (plovsky) for same, continue efforts at behav therapy wax/wane flares, exacerbated by grief following death of husband early 2015, but also: And weighs no longer fearing "hiw it will end" current med combination reviewed and updated advised to consider the lorazepam as "BP" control medication -

## 2013-05-03 NOTE — Progress Notes (Signed)
Pre visit review using our clinic review tool, if applicable. No additional management support is needed unless otherwise documented below in the visit note. 

## 2013-05-04 ENCOUNTER — Telehealth: Payer: Self-pay | Admitting: Internal Medicine

## 2013-05-04 NOTE — Telephone Encounter (Signed)
Relevant patient education mailed to patient.  

## 2013-05-11 ENCOUNTER — Telehealth: Payer: Self-pay | Admitting: Internal Medicine

## 2013-05-11 NOTE — Telephone Encounter (Signed)
Marisa Smith with [facility] called and says that they are discharging her from skilled nursing because she has met her goals. She will still be receiving home health PT. No call back requested.

## 2013-05-11 NOTE — Telephone Encounter (Signed)
Noted thanks °

## 2013-05-13 ENCOUNTER — Encounter: Payer: Self-pay | Admitting: Podiatrist

## 2013-05-13 ENCOUNTER — Ambulatory Visit (INDEPENDENT_AMBULATORY_CARE_PROVIDER_SITE_OTHER): Payer: Medicare Other | Admitting: Podiatrist

## 2013-05-13 VITALS — BP 126/59 | HR 77 | Resp 16

## 2013-05-13 DIAGNOSIS — B351 Tinea unguium: Secondary | ICD-10-CM

## 2013-05-13 DIAGNOSIS — M79609 Pain in unspecified limb: Secondary | ICD-10-CM

## 2013-05-13 NOTE — Progress Notes (Signed)
   Subjective:    Patient ID: Marisa Smith, female    DOB: [birth year], 80 y.o.   MRN: [id number]  HPI Comments: "I need my toenails cut"  Patient states that she can't get down to cut her toenails. She would like them cut today. Also, she has bunions and is wondering if she can get special shoes to accommodate.      Review of Systems  Psychiatric/Behavioral: The patient is nervous/anxious.   All other systems reviewed and are negative.      Objective:   Physical Exam  Neurovascular status is intact.  Pedal pulses palpable and neurological senstion intact.  All toenail are thickened and mycotic.  Pain in the medial corners of the hallux nails is present.  Large bunion prominence is present bilateral.  Contracture of digits present      Assessment & Plan:  Mycotic toenails. Pain  Plan:  Debrided toenails.  She will be seen back in 3 months or prn for follow up.  Discussed shoes for her bunions and she is satisfied with the ones she has already.  She is not a good surgical candidate and she agrees she is not interested in surgery.

## 2013-05-13 NOTE — Patient Instructions (Signed)
    Watch your toenails for any signs of infection including drainage, pus redness or swelling along the sides of the toenails.  Soak in epsom salt water and use antibiotic ointment (OTC) if you notice this start to occur.  If the redness does not resolve within 2-3 days, call for an appointment to be seen.  

## 2013-05-28 ENCOUNTER — Ambulatory Visit (INDEPENDENT_AMBULATORY_CARE_PROVIDER_SITE_OTHER): Payer: Medicare Other | Admitting: Family Medicine

## 2013-05-28 ENCOUNTER — Ambulatory Visit: Payer: Medicare Other

## 2013-05-28 VITALS — BP 132/68 | HR 83 | Temp 97.8°F | Resp 16 | Ht 64.75 in | Wt 102.6 lb

## 2013-05-28 DIAGNOSIS — R079 Chest pain, unspecified: Secondary | ICD-10-CM

## 2013-05-28 LAB — POCT CBC
Granulocyte percent: 76.1 %G (ref 37–80)
HCT, POC: 35.5 % — AB (ref 37.7–47.9)
HEMOGLOBIN: 11.1 g/dL — AB (ref 12.2–16.2)
Lymph, poc: 1 (ref 0.6–3.4)
MCH, POC: 32.4 pg — AB (ref 27–31.2)
MCHC: 31.3 g/dL — AB (ref 31.8–35.4)
MCV: 103.6 fL — AB (ref 80–97)
MID (CBC): 0.5 (ref 0–0.9)
MPV: 7.4 fL (ref 0–99.8)
PLATELET COUNT, POC: 199 10*3/uL (ref 142–424)
POC GRANULOCYTE: 4.7 (ref 2–6.9)
POC LYMPH PERCENT: 16.3 %L (ref 10–50)
POC MID %: 7.6 %M (ref 0–12)
RBC: 3.43 M/uL — AB (ref 4.04–5.48)
RDW, POC: 14.9 %
WBC: 6.2 10*3/uL (ref 4.6–10.2)

## 2013-05-28 NOTE — Progress Notes (Signed)
Subjective: Patient is here complaining of her chest being painful when she breathes or coughs. She started getting a little URI possibly on Thursday. She is a very poor historian. Yesterday she got complaining of chest discomfort when she takes a deep breath or cough. She continues to feel that way today. She has not hurting right now. She says is more a soreness and not a pain. No other major symptoms. HEENT unremarkable. Cardiovascular unremarkable. GI unremarkable. GU unremarkable. She lives with her daughter.  Objective: Slender elderly lady. TMs normal. Throat clear. Neck supple without nodes. Chest clear. Heart regular without murmurs. Abdomen soft. Chest wall nontender.  Assessment: Nonspecific chest pain Possible recent URI  Plan: CBC, EKG, chest x-ray  Results for orders placed in visit on 05/28/13  POCT CBC      Result Value Ref Range   WBC 6.2  4.6 - 10.2 K/uL   Lymph, poc 1.0  0.6 - 3.4   POC LYMPH PERCENT 16.3  10 - 50 %L   MID (cbc) 0.5  0 - 0.9   POC MID % 7.6  0 - 12 %M   POC Granulocyte 4.7  2 - 6.9   Granulocyte percent 76.1  37 - 80 %G   RBC 3.43 (*) 4.04 - 5.48 M/uL   Hemoglobin 11.1 (*) 12.2 - 16.2 g/dL   HCT, POC 16.135.5 (*) 09.637.7 - 47.9 %   MCV 103.6 (*) 80 - 97 fL   MCH, POC 32.4 (*) 27 - 31.2 pg   MCHC 31.3 (*) 31.8 - 35.4 g/dL   RDW, POC 04.514.9     Platelet Count, POC 199  142 - 424 K/uL   MPV 7.4  0 - 99.8 fL    [facility] reading (PRIMARY) by  Dr. Alwyn RenHopper Normal chest x-ray. Calcification of cartilages of ribs causes shadowing of the right lung. No acute findings.  Assessment: Nonspecific chest wall pain. Anemia, considerably improved from last time  Plan: Symptomatic treatment. See instructions.

## 2013-05-28 NOTE — Patient Instructions (Addendum)
Your EKG and chest x-ray and labs look good. There is no evidence of pneumonia and your heart looks good. You were anemic after your shoulder injury and surgery, but your hemoglobin (red blood level) has improved considerably and although not quite normal yet it is much better. I think this pain is from the chest wall, and you have probably had a little chest wall strain.   Take Tylenol 2 tablets every 8 hours if needed for chest wall pain  Return if symptoms persist or go to emergency room if abruptly worse.

## 2013-06-02 ENCOUNTER — Telehealth: Payer: Self-pay | Admitting: *Deleted

## 2013-06-02 NOTE — Telephone Encounter (Signed)
Joleen the PT called stating that she has been seeing the pt for Left shoulder fracture. She would like a verbal order for nursing to come back out & go over pt's meds. She states the pt seems to be confused on what she is taking. Joneen would also like an order for a Child psychotherapistsocial worker to come out.

## 2013-06-02 NOTE — Telephone Encounter (Signed)
Ok for verbal per Dr Jonny RuizJohn

## 2013-06-03 NOTE — Telephone Encounter (Signed)
Called [facility] left detailed message of MD verbal ok for request

## 2013-06-10 ENCOUNTER — Telehealth: Payer: Self-pay

## 2013-06-10 NOTE — Telephone Encounter (Signed)
[name], RN with [facility] called checking status of  verbal orders for two extended social work orders. Per previous phone note, verbal ok given.

## 2013-06-13 ENCOUNTER — Encounter: Payer: Self-pay | Admitting: Internal Medicine

## 2013-06-13 ENCOUNTER — Ambulatory Visit (INDEPENDENT_AMBULATORY_CARE_PROVIDER_SITE_OTHER): Payer: Medicare Other | Admitting: Internal Medicine

## 2013-06-13 VITALS — BP 164/74 | HR 78 | Temp 98.0°F | Resp 14 | Wt 105.2 lb

## 2013-06-13 DIAGNOSIS — F3289 Other specified depressive episodes: Secondary | ICD-10-CM

## 2013-06-13 DIAGNOSIS — L209 Atopic dermatitis, unspecified: Secondary | ICD-10-CM

## 2013-06-13 DIAGNOSIS — L2089 Other atopic dermatitis: Secondary | ICD-10-CM

## 2013-06-13 DIAGNOSIS — F32A Depression, unspecified: Secondary | ICD-10-CM

## 2013-06-13 DIAGNOSIS — F329 Major depressive disorder, single episode, unspecified: Secondary | ICD-10-CM

## 2013-06-13 DIAGNOSIS — R209 Unspecified disturbances of skin sensation: Secondary | ICD-10-CM

## 2013-06-13 DIAGNOSIS — R202 Paresthesia of skin: Secondary | ICD-10-CM

## 2013-06-13 DIAGNOSIS — I10 Essential (primary) hypertension: Secondary | ICD-10-CM

## 2013-06-13 MED ORDER — BUPROPION HCL 75 MG PO TABS: ORAL_TABLET | ORAL | Status: DC

## 2013-06-13 NOTE — Progress Notes (Signed)
Pre visit review using our clinic review tool, if applicable. No additional management support is needed unless otherwise documented below in the visit note. 

## 2013-06-13 NOTE — Patient Instructions (Addendum)
Mix Eucerin 1 part to Cort Aid 1 part and apply  Twice a day to itchy area of skin as needed . Do not get this topical steroid into eyes. Use hypoallergenic cleansing motions. Minimal Blood Pressure Goal= AVERAGE < 140/90;  Ideal is an AVERAGE < 135/85. This AVERAGE should be calculated from @ least 5-7 BP readings taken @ different times of day on different days of week. You should not respond to isolated BP readings , but rather the AVERAGE for that week .Please bring your  blood pressure cuff to office visits to verify that it is reliable.It  can also be checked against the blood pressure device at the pharmacy. Finger or wrist cuffs are not dependable; an arm cuff is. Please see Dr Donell BeersPlovsky as we discussed.

## 2013-06-13 NOTE — Progress Notes (Signed)
Subjective:    Patient ID: Marisa Smith, female    DOB: [birth year], 80 y.o.   MRN: [id number]  HPI   The history is difficult to obtain as she uses terms like " not much" or "not long" and is very vague about her symptoms. The chief complaint actually changed three times.  According to her daughter she has had increasing depression recently. She does have a history of rash with sertraline. I contemplated changing her BuSpar to bupropion until I realized she had been seeing Dr. Donell BeersPlovsky, psychiatrist. According to the daughter the patient does not want to see him because he is encouraging her to go to church & to get involved in volunteer activities , to address her depression in other ways than simply taking the pill. TSH was therapeutic in 01/15.  She has had some extrinsic symptoms with some itchy watery eyes . She has also had some itching of the lower extremities.  She describes some burning of her lips for several weeks but this is not definitive when pressed. She has no definite angioedema of the lips and tongue. She is on angiotensin receptor blocker.  Her blood pressures have been well controlled at home when checked by home health nurses with ranges of 120 -140/ less than 90.         Review of Systems   She is not having fever, chills, or sweats  There is no purulence from the upper respiratory tract  She has no productive cough, shortness breath, wheezing.        Objective:   Physical Exam  Significant or distinguishing physical findings include very flat affect. She appears adequately nourished but is thin. Appears younger than stated age  Head: Normocephalic without obvious abnormalities Eyes: No corneal or conjunctival inflammation noted. Pupils equal round reactive to light and accommodation. No icterus Ears: External  ear exam reveals no significant lesions or deformities. Canals clear .TMs normal.  Nose: External nasal exam reveals no deformity or  inflammation. Nasal mucosa are pink and moist. No lesions or exudates noted.   Mouth: Oral mucosa and oropharynx reveal no lesions or exudates. No oropharyngeal crowding. Neck: No deformities, masses, or tenderness noted. Thyroid normal Lungs: Normal respiratory effort; chest expands symmetrically. Lungs are clear to auscultation without rales, wheezes, or increased work of breathing. Heart: Normal rate and rhythm. Normal S1 and S2. No gallop, click, or rub.  Abdomen: Bowel sounds normal; abdomen soft and nontender. No masses, organomegaly or hernias noted.                                Musculoskeletal/extremities:No clubbing, cyanosis, edema, or significant extremity  deformity noted. Tone & strength normal. Hand joints normal Fingernail health good. No onycholysis Able to lie down & sit up with minimal help.  Vascular: Carotid, radial artery, dorsalis pedis and  posterior tibial pulses are full and equal. No bruits present. Neurologic:  Deep tendon reflexes symmetrical and normal.  Gait slow & deliberate. Cane not used to move to exam table.     Skin: Excoriations are present on the left greater than the right ankle. Skin dry over lower extremities. Lymph: No cervical, axillary lymphadenopathy present.                          Assessment & Plan:  #1 pruritic dermatitis  #2 depression. The pathophysiology of neurotransmitter deficiency was discussed  with her & her daughter. Multiple treatment options were also discussed with them. She should return to Dr. Donell BeersPlovsky as she is profoundly depressed and would benefit from his expertise.  #3 hypertension, controlled at home. I am hesitant to stop the angiotension receptor blocker. Repeat blood pressure revealed a value of 152/80. If definitive angioedema is documented her blocker will need to be discontinued.The history is very nebulous   See orders

## 2013-06-14 ENCOUNTER — Telehealth: Payer: Self-pay

## 2013-06-14 NOTE — Telephone Encounter (Signed)
Message copied by Noreene LarssonANDREWS, Maisy Newport R on Tue Jun 14, 2013  9:14 AM ------      Message from: Pecola LawlessHOPPER, WILLIAM F      Created: Tue Jun 14, 2013  5:44 AM       Please FAX office visit & lab results to Dr Donell BeersPlovsky , Psychiatrist ------

## 2013-06-14 NOTE — Telephone Encounter (Signed)
Office note and labs have been faxed to Dr Donell BeersPlovsky at [phone]

## 2013-06-17 ENCOUNTER — Telehealth: Payer: Self-pay | Admitting: *Deleted

## 2013-06-17 NOTE — Telephone Encounter (Signed)
[name] RN from [facility] called to advise her HH visit was cancelled by her family.

## 2013-06-21 NOTE — Telephone Encounter (Signed)
Noted thanks °

## 2013-06-24 ENCOUNTER — Encounter: Payer: Self-pay | Admitting: Podiatrist

## 2013-06-24 ENCOUNTER — Ambulatory Visit (INDEPENDENT_AMBULATORY_CARE_PROVIDER_SITE_OTHER): Payer: Medicare Other | Admitting: Podiatrist

## 2013-06-24 VITALS — BP 148/78 | HR 85 | Resp 18

## 2013-06-24 DIAGNOSIS — F3289 Other specified depressive episodes: Secondary | ICD-10-CM

## 2013-06-24 DIAGNOSIS — M2550 Pain in unspecified joint: Secondary | ICD-10-CM

## 2013-06-24 DIAGNOSIS — I10 Essential (primary) hypertension: Secondary | ICD-10-CM

## 2013-06-24 DIAGNOSIS — F329 Major depressive disorder, single episode, unspecified: Secondary | ICD-10-CM

## 2013-06-24 DIAGNOSIS — L600 Ingrowing nail: Secondary | ICD-10-CM

## 2013-06-24 DIAGNOSIS — IMO0001 Reserved for inherently not codable concepts without codable children: Secondary | ICD-10-CM

## 2013-06-24 NOTE — Patient Instructions (Signed)

## 2013-06-24 NOTE — Progress Notes (Signed)
   Subjective:    Patient ID: Marisa Smith, female    DOB: 18-Jul-1924, 80 y.o.   MRN: [id number]  HPI my big toenails are curling in and they are sensitive and I used a pad for the 2nd toe due to it rubs against the left big toe    Review of Systems  All other systems reviewed and are negative.      Objective:   Physical Exam Patient is awake, alert, and oriented x 3.  In no acute distress.  Vascular status is intact with palpable pedal pulses at 2/4 DP and PT bilateral and capillary refill time within normal limits. Neurological sensation is also intact bilaterally via Semmes Weinstein monofilament at 5/5 sites. Light touch, vibratory sensation, Achilles tendon reflex is intact. Dermatological exam reveals skin color, turger and texture as normal. No open lesions present.  Musculature intact with dorsiflexion, plantarflexion, inversion, eversion.   Bilateral hallux nails are painful on the medial corners.  No redness, swelling or signs of infection are present.  Medial sides appear to incurvated and painful. Remainder of nails are all elongated.      Assessment & Plan:  Symptomatic toenails  Plan:  Debridement carried out today and slant back performed on the medial side of bilateral hallux nails.  Discussed a permanent matrixectomy however, she states she cannot reach her toes and would not be able to care for them.  She will call if she needs any further care for the great toenails.

## 2013-07-06 ENCOUNTER — Encounter: Payer: Self-pay | Admitting: Internal Medicine

## 2013-07-06 ENCOUNTER — Ambulatory Visit (INDEPENDENT_AMBULATORY_CARE_PROVIDER_SITE_OTHER): Payer: Medicare Other | Admitting: Internal Medicine

## 2013-07-06 VITALS — BP 132/70 | HR 84 | Temp 98.0°F | Wt 106.8 lb

## 2013-07-06 DIAGNOSIS — F321 Major depressive disorder, single episode, moderate: Secondary | ICD-10-CM

## 2013-07-06 DIAGNOSIS — M810 Age-related osteoporosis without current pathological fracture: Secondary | ICD-10-CM

## 2013-07-06 DIAGNOSIS — I10 Essential (primary) hypertension: Secondary | ICD-10-CM

## 2013-07-06 DIAGNOSIS — Z111 Encounter for screening for respiratory tuberculosis: Secondary | ICD-10-CM

## 2013-07-06 DIAGNOSIS — E039 Hypothyroidism, unspecified: Secondary | ICD-10-CM

## 2013-07-06 NOTE — Assessment & Plan Note (Signed)
Lab Results  Component Value Date   TSH 3.588 02/23/2013   the current medical regimen is effective;  continue present plan and medications.

## 2013-07-06 NOTE — Patient Instructions (Addendum)
It was good to see you today.  We have reviewed your prior records including labs and tests today  We will complete form and send to [facility] as requested for admission to resident facility  Medications reviewed and updated, no changes recommended at this time.  Ears are ok!  PPD (TB test) placed today - return Friday 6/5 after 12noon for nurse to read this test -   Please schedule followup in 6 months, call sooner if problems.

## 2013-07-06 NOTE — Progress Notes (Signed)
Pre visit review using our clinic review tool, if applicable. No additional management support is needed unless otherwise documented below in the visit note. 

## 2013-07-06 NOTE — Assessment & Plan Note (Signed)
Severe symptoms with depression overlap - Perseverating and somatic "worry" Follows with psyc (plovsky) for same continue efforts at behav therapy wax/wane flares, exacerbated by grief following death of husband early 2013/06/20 current med combination reviewed and updated advised to consider the lorazepam - as "BP" control medication and "stress intervention"-

## 2013-07-06 NOTE — Progress Notes (Signed)
Subjective:    Patient ID: Marisa Smith, female    DOB: 08/27/1924, 80 y.o.   MRN: [id number]  HPI  Patient is here for follow up  Reviewed chronic medical issues and interval medical events [facility] - move in date 07/28/13  Past Medical History  Diagnosis Date  . Unspecified vitamin D deficiency   . Headache(784.0) 02/15/2010  . ALLERGIC RHINITIS CAUSE UNSPECIFIED   . ANXIETY   . DEPRESSION, MAJOR, MODERATE   . HYPERTENSION, BENIGN ESSENTIAL   . HYPOTHYROIDISM   . OSTEOPOROSIS     fx L prox hum and L distal radius, both requiring ORIF 02/2013  . Autoimmune hepatitis     on MP6, prev pred    Review of Systems  Constitutional: Positive for fatigue. Negative for fever and unexpected weight change.  HENT: Negative for dental problem, facial swelling and mouth sores.        "mouth and lips burn with all medications"  Respiratory: Negative for cough and shortness of breath.   Cardiovascular: Negative for chest pain and leg swelling.  Gastrointestinal: Positive for abdominal pain (intermittent RLQ, improved with BM) and abdominal distention. Negative for nausea and vomiting.  Psychiatric/Behavioral: Positive for dysphoric mood and decreased concentration. Negative for hallucinations, behavioral problems and confusion. The patient is nervous/anxious.        Objective:   Physical Exam  BP 132/70  Pulse 84  Temp(Src) 98 F (36.7 C) (Oral)  Wt 106 lb 12.8 oz (48.444 kg)  SpO2 98% Wt Readings from Last 3 Encounters:  07/06/13 106 lb 12.8 oz (48.444 kg)  06/13/13 105 lb 3.2 oz (47.718 kg)  05/28/13 102 lb 9.6 oz (46.539 kg)   Constitutional: She appears well-developed and well-nourished. No distress. Dtr and son in law at side HENT: TMs clear without cerumen impaction- OP clear without lip or mucosal lesion Neck: Normal range of motion. Neck supple. No JVD present. No thyromegaly present.  Cardiovascular: Normal rate, regular rhythm  and normal heart sounds.  No murmur heard. No BLE edema. Pulmonary/Chest: Effort normal and breath sounds normal. No respiratory distress. She has no wheezes.  Abdomen: Protuberant and firm but nontender, nondistended. Bowel sounds present, no rebound or guarding. No mass Psychiatric: She has an anxious and dysphoric mood and affect. Her behavior is perseverating. Judgment and thought content normal.   Lab Results  Component Value Date   WBC 6.2 05/28/2013   HGB 11.1* 05/28/2013   HCT 35.5* 05/28/2013   PLT 265 02/27/2013   GLUCOSE 121* 02/26/2013   CHOL 168 11/24/2011   TRIG 33 11/24/2011   HDL 86 11/24/2011   LDLCALC 75 11/24/2011   ALT 18 01/14/2013   AST 26 01/14/2013   NA 133* 02/26/2013   K 4.4 02/26/2013   CL 99 02/26/2013   CREATININE 0.65 02/26/2013   BUN 19 02/26/2013   CO2 23 02/26/2013   TSH 3.588 02/23/2013   INR 0.99 02/24/2013   HGBA1C 6.2 01/04/2013    Dg Shoulder Left  02/22/2013   CLINICAL DATA:  Larey SeatFell a few days ago.  Left shoulder pain.  EXAM: LEFT SHOULDER - 2+ VIEW  COMPARISON:  02/16/2013  FINDINGS: The displaced fracture of the proximal humeral metaphysis is again noted. The fractures transverse with minimal comminution. The shaft fracture fragment has displaced anteriorly and medially by full shaft width. It is less foreshortened than it was on the prior study, with approximately 5 mm of overlap. There is also  less rotation between the humeral head and shaft, with no significant varus or valgus angulation persisting.  There is no new fracture.  There is no dislocation.  IMPRESSION: Fracture of the proximal left humerus as described, still displaced, but with less foreshortening and minimal residual angulation. No new abnormality.   Electronically Signed   By: Amie Portland M.D.   On: 02/22/2013 14:30       Assessment & Plan:   Problem List Items Addressed This Visit   DEPRESSION, MAJOR, MODERATE - Primary     Severe symptoms with depression overlap - Perseverating and  somatic "worry" Follows with psyc (plovsky) for same continue efforts at behav therapy wax/wane flares, exacerbated by grief following death of husband early 03-Jun-2013 current med combination reviewed and updated advised to consider the lorazepam - as "BP" control medication and "stress intervention"-    HYPERTENSION, BENIGN ESSENTIAL      BP Readings from Last 3 Encounters:  07/06/13 132/70  06/24/13 148/78  06/13/13 164/74   10/13 meds changed due bradycardia - stopped atenolol and lisinopril Started Benicar late 06/04/2011, at max dose - no adverse symptoms - hx hyponatremia (exac by hctz in past) - Last labs reviewed The current medical regimen is effective;  continue present plan and medications.     HYPOTHYROIDISM      Lab Results  Component Value Date   TSH 3.588 02/23/2013   the current medical regimen is effective;  continue present plan and medications.     OSTEOPOROSIS     prior chronic Fosamax therapy, discontinued 08/2012 same at this time because of complaints of hoarseness Accidental fall with subsequent fracture of left proximal humerus and distal radius fracture January 2015, both for requiring ORIF repair Interval history reviewed, encouraged compliance with calcium and vitamin D but no other osteoporosis therapy recommended at this time     Other Visit Diagnoses   Screening examination for pulmonary tuberculosis        Relevant Orders       TB Skin Test (Completed)      Time spent with pt/family today 25 minutes, greater than 50% time spent counseling patient on plans for movement to assisted-living facility, completion of forms for same including PPD placement and medication review. Also review of prior records

## 2013-07-06 NOTE — Assessment & Plan Note (Signed)
prior chronic Fosamax therapy, discontinued 08/2012 same at this time because of complaints of hoarseness Accidental fall with subsequent fracture of left proximal humerus and distal radius fracture January 2015, both for requiring ORIF repair Interval history reviewed, encouraged compliance with calcium and vitamin D but no other osteoporosis therapy recommended at this time

## 2013-07-06 NOTE — Assessment & Plan Note (Signed)
BP Readings from Last 3 Encounters:  07/06/13 132/70  06/24/13 148/78  06/13/13 164/74   10/13 meds changed due bradycardia - stopped atenolol and lisinopril Started Benicar late 2013, at max dose - no adverse symptoms - hx hyponatremia (exac by hctz in past) - Last labs reviewed The current medical regimen is effective;  continue present plan and medications.

## 2013-07-08 LAB — TB SKIN TEST
Induration: 0 mm
TB Skin Test: NEGATIVE

## 2013-07-19 ENCOUNTER — Telehealth: Payer: Self-pay

## 2013-07-19 DIAGNOSIS — K754 Autoimmune hepatitis: Secondary | ICD-10-CM

## 2013-07-19 NOTE — Telephone Encounter (Signed)
Message copied by Donata DuffLEWIS, PATTY L on Tue Jul 19, 2013  8:22 AM ------      Message from: Donata DuffLEWIS, PATTY L      Created: Tue Jan 18, 2013 12:54 PM                   ----- Message -----         From: Donata DuffPatty L Lewis, CMA         Sent: 01/18/2013  11:39 AM           To: Donata DuffPatty L Lewis, CMA             (cbc, cmet) in 6 months.       ------

## 2013-07-19 NOTE — Telephone Encounter (Signed)
Pt aware labs are due

## 2013-07-21 ENCOUNTER — Other Ambulatory Visit (INDEPENDENT_AMBULATORY_CARE_PROVIDER_SITE_OTHER): Payer: Medicare Other

## 2013-07-21 DIAGNOSIS — K754 Autoimmune hepatitis: Secondary | ICD-10-CM

## 2013-07-21 LAB — CBC WITH DIFFERENTIAL/PLATELET
Basophils Absolute: 0 10*3/uL (ref 0.0–0.1)
Basophils Relative: 0.5 % (ref 0.0–3.0)
EOS ABS: 0 10*3/uL (ref 0.0–0.7)
Eosinophils Relative: 0.7 % (ref 0.0–5.0)
HEMATOCRIT: 34.6 % — AB (ref 36.0–46.0)
HEMOGLOBIN: 11.8 g/dL — AB (ref 12.0–15.0)
LYMPHS ABS: 0.6 10*3/uL — AB (ref 0.7–4.0)
Lymphocytes Relative: 10.7 % — ABNORMAL LOW (ref 12.0–46.0)
MCHC: 34.2 g/dL (ref 30.0–36.0)
MCV: 103.1 fl — ABNORMAL HIGH (ref 78.0–100.0)
Monocytes Absolute: 1 10*3/uL (ref 0.1–1.0)
Monocytes Relative: 17.9 % — ABNORMAL HIGH (ref 3.0–12.0)
NEUTROS ABS: 4 10*3/uL (ref 1.4–7.7)
Neutrophils Relative %: 70.2 % (ref 43.0–77.0)
Platelets: 199 10*3/uL (ref 150.0–400.0)
RBC: 3.36 Mil/uL — AB (ref 3.87–5.11)
RDW: 13.9 % (ref 11.5–15.5)
WBC: 5.7 10*3/uL (ref 4.0–10.5)

## 2013-07-21 LAB — COMPREHENSIVE METABOLIC PANEL
ALT: 19 U/L (ref 0–35)
AST: 23 U/L (ref 0–37)
Albumin: 4 g/dL (ref 3.5–5.2)
Alkaline Phosphatase: 61 U/L (ref 39–117)
BILIRUBIN TOTAL: 0.6 mg/dL (ref 0.2–1.2)
BUN: 18 mg/dL (ref 6–23)
CHLORIDE: 97 meq/L (ref 96–112)
CO2: 28 mEq/L (ref 19–32)
CREATININE: 0.7 mg/dL (ref 0.4–1.2)
Calcium: 9.4 mg/dL (ref 8.4–10.5)
GFR: 84.13 mL/min (ref >60.00)
Glucose, Bld: 130 mg/dL — ABNORMAL HIGH (ref 70–99)
Potassium: 4.3 mEq/L (ref 3.5–5.1)
Sodium: 130 mEq/L — ABNORMAL LOW (ref 135–145)
Total Protein: 7.1 g/dL (ref 6.0–8.3)

## 2013-08-22 ENCOUNTER — Telehealth: Payer: Self-pay | Admitting: *Deleted

## 2013-08-22 NOTE — Telephone Encounter (Signed)
Left msg on triage stating pt was admitted to their facility several days ago for increase confusion & difficulties getting around in her home. Just wanted to get md fax # to fax orders if needed. Also wanted to inform md pt had been running temp around 102.1. Gave tylenol supp has went down to 101.4. They have standing orders for tylenol as needed. Requesting calling back to confirm msg. Called [name] back inform her we did received gave md fax #...Raechel Chute/lmb

## 2013-08-25 ENCOUNTER — Ambulatory Visit: Payer: Medicare Other | Admitting: Podiatrist

## 2013-12-28 ENCOUNTER — Other Ambulatory Visit: Payer: Self-pay

## 2013-12-28 DIAGNOSIS — K754 Autoimmune hepatitis: Secondary | ICD-10-CM

## 2014-01-03 ENCOUNTER — Other Ambulatory Visit (INDEPENDENT_AMBULATORY_CARE_PROVIDER_SITE_OTHER): Payer: Medicare Other

## 2014-01-03 DIAGNOSIS — K754 Autoimmune hepatitis: Secondary | ICD-10-CM

## 2014-01-03 LAB — CBC WITH DIFFERENTIAL/PLATELET
BASOS PCT: 0.4 % (ref 0.0–3.0)
Basophils Absolute: 0 10*3/uL (ref 0.0–0.1)
Eosinophils Absolute: 0.2 10*3/uL (ref 0.0–0.7)
Eosinophils Relative: 4.2 % (ref 0.0–5.0)
HCT: 34.9 % — ABNORMAL LOW (ref 36.0–46.0)
HEMOGLOBIN: 11.6 g/dL — AB (ref 12.0–15.0)
Lymphocytes Relative: 18.9 % (ref 12.0–46.0)
Lymphs Abs: 0.9 10*3/uL (ref 0.7–4.0)
MCHC: 33.2 g/dL (ref 30.0–36.0)
MCV: 100.4 fl — ABNORMAL HIGH (ref 78.0–100.0)
MONOS PCT: 10.6 % (ref 3.0–12.0)
Monocytes Absolute: 0.5 10*3/uL (ref 0.1–1.0)
NEUTROS ABS: 3.2 10*3/uL (ref 1.4–7.7)
Neutrophils Relative %: 65.9 % (ref 43.0–77.0)
Platelets: 212 10*3/uL (ref 150.0–400.0)
RBC: 3.47 Mil/uL — AB (ref 3.87–5.11)
RDW: 13.3 % (ref 11.5–15.5)
WBC: 4.9 10*3/uL (ref 4.0–10.5)

## 2014-01-04 LAB — COMPREHENSIVE METABOLIC PANEL
ALT: 170 U/L — ABNORMAL HIGH (ref 0–35)
AST: 173 U/L — ABNORMAL HIGH (ref 0–37)
Albumin: 3.3 g/dL — ABNORMAL LOW (ref 3.5–5.2)
Alkaline Phosphatase: 195 U/L — ABNORMAL HIGH (ref 39–117)
BUN: 15 mg/dL (ref 6–23)
CALCIUM: 9.3 mg/dL (ref 8.4–10.5)
CHLORIDE: 102 meq/L (ref 96–112)
CO2: 26 mEq/L (ref 19–32)
Creatinine, Ser: 0.8 mg/dL (ref 0.4–1.2)
GFR: 76.43 mL/min (ref >60.00)
Glucose, Bld: 75 mg/dL (ref 70–99)
Potassium: 4.5 mEq/L (ref 3.5–5.1)
Sodium: 135 mEq/L (ref 135–145)
Total Bilirubin: 0.6 mg/dL (ref 0.2–1.2)
Total Protein: 7.2 g/dL (ref 6.0–8.3)

## 2014-01-05 ENCOUNTER — Ambulatory Visit: Payer: Medicare Other | Admitting: Internal Medicine

## 2014-01-05 ENCOUNTER — Other Ambulatory Visit: Payer: Self-pay

## 2014-01-05 DIAGNOSIS — R945 Abnormal results of liver function studies: Principal | ICD-10-CM

## 2014-01-05 DIAGNOSIS — R7989 Other specified abnormal findings of blood chemistry: Secondary | ICD-10-CM

## 2014-01-05 MED ORDER — MERCAPTOPURINE 50 MG PO TABS: 25.0000 mg | ORAL_TABLET | Freq: Every day | ORAL | Status: AC

## 2014-01-06 ENCOUNTER — Encounter: Payer: Self-pay | Admitting: Gastroenterology

## 2014-01-06 ENCOUNTER — Other Ambulatory Visit: Payer: Self-pay | Admitting: *Deleted

## 2014-01-06 ENCOUNTER — Ambulatory Visit (INDEPENDENT_AMBULATORY_CARE_PROVIDER_SITE_OTHER): Payer: Medicare Other | Admitting: Gastroenterology

## 2014-01-06 VITALS — BP 124/74 | HR 64 | Ht 64.75 in | Wt 99.2 lb

## 2014-01-06 DIAGNOSIS — R748 Abnormal levels of other serum enzymes: Secondary | ICD-10-CM

## 2014-01-06 NOTE — Progress Notes (Signed)
Review of pertinent gastrointestinal problems:  1. Autoimmune hepatitis: diagnosed with AIH 2007 in [facility], she presented with jaundice, nausea. Eventually had liver biopsy. She was on prednisone for about 2 years. She was tried on another med that made her nausea then 6mp. She has been on 6mp for 3-4 years solo therapy. 12/2011 office visit, labs normal, doing well clinically, continue purenithol at her usual dose (25mg  daily).  01/2013 office visit doing well, LFTs normal on daily 6mp 25mg .  I advised she continue it and see me again in 1 year with LFTs at 6 months.  HPI: This is a very pleasant 80 year old frail woman whom I last saw about 6 months ago.  Her daughter came to drop off her co-pay prior to her visit today. I asked to speak with her since we had received a message from her yesterday explaining that she did not want me to discuss anything that could upset the patient at the time of her visit. I wanted to clarify those wishes as well as discuss her underlying medical issues.    Her LFTs, checked earlier this week were very elevated and so we contacted her NH yesterday to ask about her 6mp dosing. The NH explained that the medicine was stopped several months ago "at the family's request." I asked that they restart the medicine at her previous dose yesterday and according to their med list, she was indeed restarted on it.  Her daughter adamantly denied that the family asked that any medicines be adjusted or stopped.  She was upset at the suggestion and explained that she may have her "lawyer look into it". The daughter does not want me to tell her mother that her liver is irritated from her underlying AIH. She told me to tell her mother that "everything is all right and that I am just making some medication adjustments". She explained to me that her mother is demented and she may seem okay in the office but she is not and she can get very emotionally unstable with any bad news.  I asked  if the daughter couldn't stay for her mother's upcoming office visit which is about from now she told me she could not because she was late for work.   In the room during her visit the patient admitted to No abd pains, no nausea or vomiting.  She tells me her liver is fine. She has a poor appetite but no specific abdominal pains. No jaundice or fevers    Past Medical History  Diagnosis Date  . Unspecified vitamin D deficiency   . Headache(784.0) 02/15/2010  . ALLERGIC RHINITIS CAUSE UNSPECIFIED   . ANXIETY   . DEPRESSION, MAJOR, MODERATE   . HYPERTENSION, BENIGN ESSENTIAL   . HYPOTHYROIDISM   . OSTEOPOROSIS     fx L prox hum and L distal radius, both requiring ORIF 02/2013  . Autoimmune hepatitis     on MP6, prev pred    Past Surgical History  Procedure Laterality Date  . Cataract extraction  2008    Left eye  . Cataract extraction  2009    Right eye  . Tonsillectomy  1945  . Open reduction internal fixation (orif) distal radial fracture Left 02/16/2013    Procedure: OPEN REDUCTION INTERNAL FIXATION (ORIF) DISTAL RADIAL FRACTURE;  Surgeon: Sharma Covert, MD;  Location: [facility] OR;  Service: Orthopedics;  Laterality: Left;  . Humerus im nail Left 02/2013    DR Sol Blazing  . Humerus im nail Left  02/24/2013    Procedure: INTRAMEDULLARY (IM) NAIL LEFT PROXIMAL  HUMERUS;  Surgeon: Mable ParisJustin William Chandler, MD;  Location: [facility] OR;  Service: Orthopedics;  Laterality: Left;    Current Outpatient Prescriptions  Medication Sig Dispense Refill  . aspirin EC 81 MG EC tablet Take 81 mg by mouth daily.     . busPIRone (BUSPAR) 15 MG tablet Take 1 tablet by mouth 2 (two) times daily.  10  . calcium gluconate 500 MG tablet Take 500 mg by mouth daily.      . Cholecalciferol (VITAMIN D3) 2000 UNITS TABS Take 2,000 Units by mouth daily.      [name] Kitchen. levothyroxine (SYNTHROID, LEVOTHROID) 50 MCG tablet Take 1 tablet (50 mcg total) by mouth daily before breakfast. 30 tablet 5  . LORazepam (ATIVAN) 0.5 MG  tablet Take 1 tablet by mouth 2 (two) times daily.  4  . mercaptopurine (PURINETHOL) 50 MG tablet Take 0.5 tablets (25 mg total) by mouth daily. Give on an empty stomach 1 hour before or 2 hours after meals. Caution: Chemotherapy. 30 tablet 11  . mirtazapine (REMERON) 30 MG tablet Take 1 tablet by mouth at bedtime.  10  . olmesartan (BENICAR) 40 MG tablet Take 1 tablet (40 mg total) by mouth daily. 30 tablet 5   No current facility-administered medications for this visit.    Allergies as of 01/06/2014 - Review Complete 01/06/2014  Allergen Reaction Noted  . Amlodipine besylate    . Azatadine  02/22/2013  . Azathioprine    . Penicillins    . Sertraline hcl  10/25/2009  . Sulfonamide derivatives      Family History  Problem Relation Age of Onset  . Hypertension Mother   . Stroke Mother   . Heart disease Father   . Stroke Sister     History   Social History  . Marital Status: Widowed    Spouse Name: N/A    Number of Children: 1  . Years of Education: N/A   Occupational History  .     Social History Main Topics  . Smoking status: Never Smoker   . Smokeless tobacco: Never Used     Comment: Married, Retired- daughter is [name] Witting-Fifield responsible for majority of supervison for parents  . Alcohol Use: No  . Drug Use: No  . Sexual Activity: Not on file   Other Topics Concern  . Not on file   Social History Narrative      Physical Exam: BP 124/74 mmHg  Pulse 64  Ht 5' 4.75" (1.645 m)  Wt 99 lb 4 oz (45.02 kg)  BMI 16.64 kg/m2 Constitutional: Frail, sits in a wheelchair Psychiatric: alert and oriented x3 Abdomen: soft, nontender, nondistended, no obvious ascites, no peritoneal signs, normal bowel sounds     Assessment and plan: 80 y.o. female with history of autoimmune hepatitis, currently elevated liver tests  Her transaminases were in the 1-200 range earlier this week. This is probably from her known autoimmune hepatitis that has been untreated over  the past several months after immune suppressing medicine was discontinued. It is not clear why the medicine was discontinued. I asked that it be restarted yesterday and wrote her a new prescription for it and I see on her medication list from the nursing home that indeed it has been restarted. I would like to rule out obstructive, biliary causes with an abdominal ultrasound and we will order that. She will need a set of labs in 2 weeks, liver function tests, to see  if the 6-MP is working. If it does not seem to be improving her transaminase elevation but I will likely start her on prednisone.

## 2014-01-06 NOTE — Patient Instructions (Addendum)
You should resume the 6 mp at previous dose 25mg  once daily. You will be set up for an ultrasound for elevated liver tests. You need repeat labs (LFTs in 2 weeks).  You have been scheduled for an abdominal [facility] (1st floor of hospital) on 01/13/2014 at 9:30am. Please arrive 15 minutes prior to your appointment for registration. Make certain not to have anything to eat or drink 6 hours prior to your appointment. Should you need to reschedule your appointment, please contact radiology at [phone]. This test typically takes about 30 minutes to perform.

## 2014-01-13 ENCOUNTER — Ambulatory Visit (HOSPITAL_COMMUNITY)
Admission: RE | Admit: 2014-01-13 | Discharge: 2014-01-13 | Disposition: A | Discharge status: Home / Self Care | Payer: Medicare Other | Source: Ambulatory Visit | Attending: Gastroenterology | Admitting: Gastroenterology

## 2014-01-13 ENCOUNTER — Telehealth: Payer: Self-pay | Admitting: Gastroenterology

## 2014-01-13 DIAGNOSIS — R748 Abnormal levels of other serum enzymes: Secondary | ICD-10-CM

## 2014-01-13 DIAGNOSIS — R945 Abnormal results of liver function studies: Principal | ICD-10-CM

## 2014-01-13 DIAGNOSIS — R7989 Other specified abnormal findings of blood chemistry: Secondary | ICD-10-CM

## 2014-01-13 NOTE — Telephone Encounter (Signed)
Reviewed labs dated 01/11/2014: Alk phos 254, ast 229, alt 275  These are all rising.  Please call her NH, confirm that she restarted her 44mp ($RemoveB'25mg'eaYLGriS$  once daily) as of a week or so ago. She also now needs to start prednisone $RemoveBeforeDE'10mg'ZOpRxnndWfJLLbV$  pill, one pill twice daily since her LFTs are rising.  She needs cmet, inr, cbc in 2 weeks (can be done at her NH and the results faxed to me).

## 2014-01-16 MED ORDER — PREDNISONE 10 MG PO TABS: 10.0000 mg | ORAL_TABLET | Freq: Two times a day (BID) | ORAL | Status: DC

## 2014-01-16 NOTE — Telephone Encounter (Signed)
Pt daughter and living facility is aware and orders have been faxed to the nursing facility

## 2014-01-19 ENCOUNTER — Encounter: Payer: Self-pay | Admitting: Gastroenterology

## 2014-01-31 ENCOUNTER — Telehealth: Payer: Self-pay | Admitting: Gastroenterology

## 2014-01-31 NOTE — Telephone Encounter (Signed)
Pt daughter is aware, orders have also been called and faxed to [facility] to [place] at [phone]

## 2014-01-31 NOTE — Telephone Encounter (Signed)
Reviewed labs dated 01/30/2014:  AST 23, ALT 44, alk phos 125   Please call NH and her family.  Her liver tests are MUCH improved since starting prednisone;  She needs to continue her 6MP (indefinitely, should not be stopped). She needs to continue prednisone 10mg  twice daily for another 1 week. After 1 week, then she should start decreasing her prednisone dose by 5mg  every week until she gets to 5mg  once daily, then stop decreasing.  Needs LFTs, CBC in 5 week with results faxed to me.

## 2014-01-31 NOTE — Telephone Encounter (Signed)
Left message on machine to call back  

## 2014-03-24 ENCOUNTER — Telehealth: Payer: Self-pay | Admitting: Gastroenterology

## 2014-03-24 NOTE — Telephone Encounter (Signed)
Doubt it is from this medicine which she had been on for many years before it was stopped in 2015.  Should continue it at current dose.

## 2014-03-24 NOTE — Telephone Encounter (Signed)
Spoke with Persina and she is aware.

## 2014-03-24 NOTE — Telephone Encounter (Signed)
Pt has hair done weekly at her nursing facility and they have noticed over the past couple of weeks her hair is falling out in clumps. They noticed that temporary hair loss is listed as side effect of 6-MP and wanted to know if there is an alternative drug pt may try due to the hair loss. Please advise.

## 2016-02-05 ENCOUNTER — Emergency Department (HOSPITAL_COMMUNITY): Payer: Medicare Other

## 2016-02-05 ENCOUNTER — Encounter (HOSPITAL_COMMUNITY): Payer: Self-pay | Admitting: Emergency Medicine

## 2016-02-05 ENCOUNTER — Emergency Department (HOSPITAL_COMMUNITY)
Admission: EM | Admit: 2016-02-05 | Discharge: 2016-02-05 | Disposition: A | Discharge status: Home / Self Care | Payer: Medicare Other | Attending: Emergency Medicine | Admitting: Emergency Medicine

## 2016-02-05 DIAGNOSIS — G4489 Other headache syndrome: Secondary | ICD-10-CM | POA: Diagnosis not present

## 2016-02-05 DIAGNOSIS — I10 Essential (primary) hypertension: Secondary | ICD-10-CM | POA: Diagnosis not present

## 2016-02-05 DIAGNOSIS — R51 Headache: Secondary | ICD-10-CM | POA: Diagnosis not present

## 2016-02-05 DIAGNOSIS — R030 Elevated blood-pressure reading, without diagnosis of hypertension: Secondary | ICD-10-CM | POA: Diagnosis not present

## 2016-02-05 DIAGNOSIS — R112 Nausea with vomiting, unspecified: Secondary | ICD-10-CM | POA: Diagnosis not present

## 2016-02-05 DIAGNOSIS — Z7982 Long term (current) use of aspirin: Secondary | ICD-10-CM | POA: Insufficient documentation

## 2016-02-05 DIAGNOSIS — E039 Hypothyroidism, unspecified: Secondary | ICD-10-CM | POA: Diagnosis not present

## 2016-02-05 DIAGNOSIS — R519 Headache, unspecified: Secondary | ICD-10-CM

## 2016-02-05 LAB — BASIC METABOLIC PANEL
ANION GAP: 8 (ref 5–15)
BUN: 32 mg/dL — AB (ref 6–20)
CHLORIDE: 97 mmol/L — AB (ref 101–111)
CO2: 27 mmol/L (ref 22–32)
Calcium: 9.5 mg/dL (ref 8.9–10.3)
Creatinine, Ser: 0.76 mg/dL (ref 0.44–1.00)
GFR calc Af Amer: >60 mL/min (ref >60)
GFR calc non Af Amer: >60 mL/min (ref >60)
GLUCOSE: 134 mg/dL — AB (ref 65–99)
Potassium: 3.6 mmol/L (ref 3.5–5.1)
SODIUM: 132 mmol/L — AB (ref 135–145)

## 2016-02-05 LAB — URINALYSIS, ROUTINE W REFLEX MICROSCOPIC
Bilirubin Urine: NEGATIVE
Glucose, UA: 50 mg/dL — AB
HGB URINE DIPSTICK: NEGATIVE
Ketones, ur: 5 mg/dL — AB
LEUKOCYTES UA: NEGATIVE
Nitrite: NEGATIVE
PROTEIN: NEGATIVE mg/dL
SPECIFIC GRAVITY, URINE: 1.01 (ref 1.005–1.030)
pH: 7 (ref 5.0–8.0)

## 2016-02-05 LAB — CBC
HCT: 39.2 % (ref 36.0–46.0)
HEMOGLOBIN: 13.4 g/dL (ref 12.0–15.0)
MCH: 33.8 pg (ref 26.0–34.0)
MCHC: 34.2 g/dL (ref 30.0–36.0)
MCV: 99 fL (ref 78.0–100.0)
Platelets: 175 10*3/uL (ref 150–400)
RBC: 3.96 MIL/uL (ref 3.87–5.11)
RDW: 12.3 % (ref 11.5–15.5)
WBC: 8.6 10*3/uL (ref 4.0–10.5)

## 2016-02-05 MED ORDER — MORPHINE SULFATE (PF) 2 MG/ML IV SOLN
2.0000 mg | Freq: Once | INTRAVENOUS | Status: AC
  Administered 2016-02-05: 2 mg via INTRAVENOUS
  Filled 2016-02-05: qty 1

## 2016-02-05 MED ORDER — ONDANSETRON 8 MG PO TBDP: 8.0000 mg | ORAL_TABLET | Freq: Three times a day (TID) | ORAL | 0 refills | Status: AC | PRN

## 2016-02-05 MED ORDER — SODIUM CHLORIDE 0.9 % IV BOLUS (SEPSIS)
1000.0000 mL | Freq: Once | INTRAVENOUS | Status: AC
Start: 2016-02-05 — End: 2016-02-05
  Administered 2016-02-05: 1000 mL via INTRAVENOUS

## 2016-02-05 MED ORDER — ONDANSETRON HCL 4 MG/2ML IJ SOLN
4.0000 mg | Freq: Once | INTRAMUSCULAR | Status: AC
  Administered 2016-02-05: 4 mg via INTRAVENOUS
  Filled 2016-02-05: qty 2

## 2016-02-05 NOTE — ED Notes (Signed)
Patient transported to CT 

## 2016-02-05 NOTE — ED Notes (Signed)
[facility] Expected date:  Expected time:  Means of arrival:  Comments: Room 18

## 2016-02-05 NOTE — ED Notes (Signed)
RN at bedside starting IV will collect labs 

## 2016-02-05 NOTE — ED Notes (Signed)
Bed: WHALD Expected date:  Expected time:  Means of arrival:  Comments: 

## 2016-02-05 NOTE — ED Triage Notes (Signed)
Per EMS, patient from [facility], c/o headache x2 days. Denies fall, SOB, and chest pain. Hx hypertension. Given 1000mg  Tylenol yesterday with some relief. A&Ox4.

## 2016-02-05 NOTE — ED Notes (Signed)
RN is starting

## 2016-02-05 NOTE — ED Notes (Signed)
Delay on blood draw pt being transported to xray

## 2016-02-05 NOTE — ED Provider Notes (Signed)
[facility]-EMERGENCY DEPT Provider Note   CSN: [id number] Arrival date & time: 02/05/16  1347     History   Chief Complaint Chief Complaint  Patient presents with  . Headache    Level V caveat: Dementia  HPI Marisa Smith is a 83 y.o. female.  HPI Patient is brought to the emergency department with complaints of headache over the past 2 days.  No reports of fall or injury.  Patient was seen and evaluated by her physician at the facility today and found to be hypertensive and thus was sent to the ER for evaluation including desire for head CT given elevated blood pressure at the facility.  Family reports baseline mental status for her.  No reports of vomiting but they do report that the patient said she's nauseated.   Past Medical History:  Diagnosis Date  . ALLERGIC RHINITIS CAUSE UNSPECIFIED   . ANXIETY   . Autoimmune hepatitis (HCC)    on MP6, prev pred  . DEPRESSION, MAJOR, MODERATE   . Headache(784.0) 02/15/2010  . HYPERTENSION, BENIGN ESSENTIAL   . HYPOTHYROIDISM   . OSTEOPOROSIS    fx L prox hum and L distal radius, both requiring ORIF 02/2013  . Unspecified vitamin D deficiency     Patient Active Problem List   Diagnosis Date Noted  . Proximal left humerus fracture 02/22/2013  . Radius fracture 02/16/2013  . Bradycardia 12/06/2011  . Hyponatremia 11/24/2011  . Hyperglycemia 11/24/2011  . Autoimmune hepatitis (HCC)   . Headache(784.0) 02/15/2010  . TREMOR 02/02/2010  . ANXIETY 12/11/2009  . DEPRESSION, MAJOR, MODERATE 10/19/2009  . HYPOTHYROIDISM 04/26/2009  . Unspecified vitamin D deficiency 04/26/2009  . HYPERTENSION, BENIGN ESSENTIAL 04/26/2009  . OSTEOPOROSIS 04/26/2009    Past Surgical History:  Procedure Laterality Date  . CATARACT EXTRACTION  2008   Left eye  . CATARACT EXTRACTION  2009   Right eye  . HUMERUS IM NAIL Left 02/2013   DR Sol Blazing  . HUMERUS IM NAIL Left 02/24/2013   Procedure: INTRAMEDULLARY (IM) NAIL LEFT PROXIMAL  HUMERUS;  Surgeon:  Mable Paris, MD;  Location: [facility] OR;  Service: Orthopedics;  Laterality: Left;  . OPEN REDUCTION INTERNAL FIXATION (ORIF) DISTAL RADIAL FRACTURE Left 02/16/2013   Procedure: OPEN REDUCTION INTERNAL FIXATION (ORIF) DISTAL RADIAL FRACTURE;  Surgeon: Sharma Covert, MD;  Location: [facility] OR;  Service: Orthopedics;  Laterality: Left;  . TONSILLECTOMY  1945    OB History    No data available       Home Medications    Prior to Admission medications   Medication Sig Start Date End Date Taking? Authorizing Provider  aspirin EC 81 MG EC tablet Take 81 mg by mouth daily.    Yes Historical Provider, MD  busPIRone (BUSPAR) 15 MG tablet Take 15 mg by mouth 2 (two) times daily.  12/20/13  Yes Historical Provider, MD  Calcium Carbonate-Vitamin D 600-200 MG-UNIT CAPS Take 1 capsule by mouth daily.   Yes Historical Provider, MD  Cholecalciferol (VITAMIN D3) 1000 units CAPS Take 2,000 Units by mouth daily.   Yes Historical Provider, MD  Eyelid Cleansers (OCUSOFT LID SCRUB) PADS Place 1 each into both eyes 2 (two) times daily.   Yes Historical Provider, MD  levothyroxine (SYNTHROID, LEVOTHROID) 50 MCG tablet Take 1 tablet (50 mcg total) by mouth daily before breakfast. 05/03/13  Yes Newt Lukes, MD  LORazepam (ATIVAN) 0.5 MG tablet Take 0.5 mg by mouth 2 (two) times daily.  12/22/13  Yes Historical Provider, MD  mercaptopurine (PURINETHOL) 50 MG tablet Take 0.5 tablets (25 mg total) by mouth daily. Give on an empty stomach 1 hour before or 2 hours after meals. Caution: Chemotherapy. 01/05/14  Yes Rachael Fee, MD  mirtazapine (REMERON) 30 MG tablet Take 30 mg by mouth at bedtime.  12/20/13  Yes Historical Provider, MD  predniSONE (DELTASONE) 10 MG tablet Take 1 tablet (10 mg total) by mouth 2 (two) times daily with a meal. 01/16/14  Yes Rachael Fee, MD  Propylene Glycol-Glycerin (ARTIFICIAL TEARS) 1-0.3 % SOLN Place 1 drop into both eyes 3 (three) times daily.   Yes Historical Provider, MD    ondansetron (ZOFRAN ODT) 8 MG disintegrating tablet Take 1 tablet (8 mg total) by mouth every 8 (eight) hours as needed for nausea or vomiting. 02/05/16   Azalia Bilis, MD    Family History Family History  Problem Relation Age of Onset  . Hypertension Mother   . Stroke Mother   . Heart disease Father   . Stroke Sister     Social History Social History  Substance Use Topics  . Smoking status: Never Smoker  . Smokeless tobacco: Never Used     Comment: Married, Retired- daughter is Okey Regal Freitas-Fifield responsible for majority of supervison for parents  . Alcohol use No     Allergies   Amlodipine besylate; Azatadine; Azathioprine; Penicillins; Sertraline hcl; and Sulfonamide derivatives   Review of Systems Review of Systems  Unable to perform ROS: Dementia     Physical Exam Updated Vital Signs BP 176/85 Comment: Simultaneous filing. User may not have seen previous data.  Pulse 74 Comment: Simultaneous filing. User may not have seen previous data.  Temp 98.4 F (36.9 C) (Oral)   Resp 18 Comment: Simultaneous filing. User may not have seen previous data.  SpO2 96% Comment: Simultaneous filing. User may not have seen previous data.  Physical Exam  Constitutional: She appears well-developed and well-nourished. No distress.  HENT:  Head: Normocephalic and atraumatic.  Eyes: EOM are normal. Pupils are equal, round, and reactive to light.  Neck: Normal range of motion.  Cardiovascular: Normal rate, regular rhythm and normal heart sounds.   Pulmonary/Chest: Effort normal and breath sounds normal.  Abdominal: Soft. She exhibits no distension. There is no tenderness.  Musculoskeletal: Normal range of motion.  Neurological: She is alert.  5/5 strength in major muscle groups of  bilateral upper and lower extremities. Speech normal. No facial asymetry.   Skin: Skin is warm and dry.  Psychiatric: She has a normal mood and affect. Judgment normal.  Nursing note and vitals  reviewed.    ED Treatments / Results  Labs (all labs ordered are listed, but only abnormal results are displayed) Labs Reviewed  BASIC METABOLIC PANEL - Abnormal; Notable for the following:       Result Value   Sodium 132 (*)    Chloride 97 (*)    Glucose, Bld 134 (*)    BUN 32 (*)    All other components within normal limits  URINALYSIS, ROUTINE W REFLEX MICROSCOPIC - Abnormal; Notable for the following:    Color, Urine STRAW (*)    Glucose, UA 50 (*)    Ketones, ur 5 (*)    All other components within normal limits  CBC   BUN  Date Value Ref Range Status  02/05/2016 32 (H) 6 - 20 mg/dL Final  69/62/9528 15 6 - 23 mg/dL Final  41/32/4401 18 6 - 23 mg/dL Final  02/72/5366 19 6 -  23 mg/dL Final   Creatinine, Ser  Date Value Ref Range Status  02/05/2016 0.76 0.44 - 1.00 mg/dL Final  45/40/981112/02/2013 0.8 0.4 - 1.2 mg/dL Final  91/47/829506/18/2015 0.7 0.4 - 1.2 mg/dL Final  62/13/086501/24/2015 7.840.65 0.50 - 1.10 mg/dL Final      EKG  EKG Interpretation None       Radiology Ct Head Wo Contrast  Result Date: 02/05/2016 CLINICAL DATA:  Headache with vomiting EXAM: CT HEAD WITHOUT CONTRAST TECHNIQUE: Contiguous axial images were obtained from the base of the skull through the vertex without intravenous contrast. COMPARISON:  February 16, 2013 FINDINGS: Brain: There is mild diffuse atrophy. There is no intracranial mass, hemorrhage, extra-axial fluid collection, or midline shift. There is patchy small vessel disease throughout the centra semiovale bilaterally. There is no new gray-white compartment lesion. No evident acute infarct. Vascular: There is no hyperdense vessel. There is calcification in the carotid siphon regions bilaterally. There is a small focus of calcification in the distal aspect of the right vertebral artery. Skull: The bony calvarium appears intact. Sinuses/Orbits: There is mucosal thickening in several ethmoid air cells bilaterally. Other paranasal sinuses which are visualized are  clear. Orbits appear symmetric bilaterally. Other: Mastoid air cells are clear. IMPRESSION: Atrophy with patchy periventricular small vessel disease. No intracranial mass, hemorrhage, or extra-axial fluid collection. No acute appearing infarct. There are foci of arterial vascular calcification. There is mild ethmoid sinus disease bilaterally. Electronically Signed   By: Bretta BangWilliam  Woodruff III M.D.   On: 02/05/2016 15:47    Procedures Procedures (including critical care time)  Medications Ordered in ED Medications  sodium chloride 0.9 % bolus 1,000 mL (0 mLs Intravenous Stopped 02/05/16 1706)  ondansetron (ZOFRAN) injection 4 mg (4 mg Intravenous Given 02/05/16 1550)  morphine 2 MG/ML injection 2 mg (2 mg Intravenous Given 02/05/16 1552)     Initial Impression / Assessment and Plan / ED Course  I have reviewed the triage vital signs and the nursing notes.  Pertinent labs & imaging results that were available during my care of the patient were reviewed by me and considered in my medical decision making (see chart for details).  Clinical Course     BUN mildly elevated but creatinine normal.  Likely mild volume depletion.  Feels better at this time.  Head CT normal.  Tolerating oral fluids.  Discharge home in good condition.  Family updated  Final Clinical Impressions(s) / ED Diagnoses   Final diagnoses:  Nonintractable headache, unspecified chronicity pattern, unspecified headache type    New Prescriptions New Prescriptions   ONDANSETRON (ZOFRAN ODT) 8 MG DISINTEGRATING TABLET    Take 1 tablet (8 mg total) by mouth every 8 (eight) hours as needed for nausea or vomiting.     Azalia BilisKevin Evalyne Cortopassi, MD 02/05/16 [phone]

## 2016-02-05 NOTE — ED Notes (Signed)
Message left on [facility] answering machine regarding the return of the patient and discharge instructions.

## 2016-02-05 NOTE — ED Notes (Signed)
[facility] called for transportation back to [facility].

## 2016-02-18 DIAGNOSIS — F325 Major depressive disorder, single episode, in full remission: Secondary | ICD-10-CM | POA: Diagnosis not present

## 2016-02-18 DIAGNOSIS — N183 Chronic kidney disease, stage 3 (moderate): Secondary | ICD-10-CM | POA: Diagnosis not present

## 2016-02-18 DIAGNOSIS — I129 Hypertensive chronic kidney disease with stage 1 through stage 4 chronic kidney disease, or unspecified chronic kidney disease: Secondary | ICD-10-CM | POA: Diagnosis not present

## 2016-02-18 DIAGNOSIS — K754 Autoimmune hepatitis: Secondary | ICD-10-CM | POA: Diagnosis not present

## 2016-02-27 DIAGNOSIS — R319 Hematuria, unspecified: Secondary | ICD-10-CM | POA: Diagnosis not present

## 2016-02-27 DIAGNOSIS — K754 Autoimmune hepatitis: Secondary | ICD-10-CM | POA: Diagnosis not present

## 2016-02-27 DIAGNOSIS — N390 Urinary tract infection, site not specified: Secondary | ICD-10-CM | POA: Diagnosis not present

## 2016-02-27 DIAGNOSIS — Z79899 Other long term (current) drug therapy: Secondary | ICD-10-CM | POA: Diagnosis not present

## 2016-02-27 DIAGNOSIS — R5383 Other fatigue: Secondary | ICD-10-CM | POA: Diagnosis not present

## 2016-02-27 DIAGNOSIS — R509 Fever, unspecified: Secondary | ICD-10-CM | POA: Diagnosis not present

## 2016-02-29 ENCOUNTER — Encounter (HOSPITAL_COMMUNITY): Payer: Self-pay | Admitting: Emergency Medicine

## 2016-02-29 ENCOUNTER — Emergency Department (HOSPITAL_COMMUNITY): Payer: Medicare Other

## 2016-02-29 ENCOUNTER — Inpatient Hospital Stay (HOSPITAL_COMMUNITY)
Admission: EM | Admit: 2016-02-29 | Discharge: 2016-03-03 | DRG: 511 | Disposition: A | Discharge status: Home Health | Payer: Medicare Other | Attending: Orthopedic Surgery | Admitting: Orthopedic Surgery

## 2016-02-29 DIAGNOSIS — I517 Cardiomegaly: Secondary | ICD-10-CM | POA: Diagnosis not present

## 2016-02-29 DIAGNOSIS — Y92099 Unspecified place in other non-institutional residence as the place of occurrence of the external cause: Secondary | ICD-10-CM | POA: Diagnosis not present

## 2016-02-29 DIAGNOSIS — I10 Essential (primary) hypertension: Secondary | ICD-10-CM | POA: Diagnosis present

## 2016-02-29 DIAGNOSIS — Z8249 Family history of ischemic heart disease and other diseases of the circulatory system: Secondary | ICD-10-CM

## 2016-02-29 DIAGNOSIS — R52 Pain, unspecified: Secondary | ICD-10-CM | POA: Diagnosis not present

## 2016-02-29 DIAGNOSIS — Z888 Allergy status to other drugs, medicaments and biological substances status: Secondary | ICD-10-CM | POA: Diagnosis not present

## 2016-02-29 DIAGNOSIS — J9811 Atelectasis: Secondary | ICD-10-CM | POA: Diagnosis not present

## 2016-02-29 DIAGNOSIS — Z880 Allergy status to penicillin: Secondary | ICD-10-CM

## 2016-02-29 DIAGNOSIS — J90 Pleural effusion, not elsewhere classified: Secondary | ICD-10-CM | POA: Diagnosis not present

## 2016-02-29 DIAGNOSIS — E039 Hypothyroidism, unspecified: Secondary | ICD-10-CM | POA: Diagnosis not present

## 2016-02-29 DIAGNOSIS — S59291A Other physeal fracture of lower end of radius, right arm, initial encounter for closed fracture: Secondary | ICD-10-CM | POA: Diagnosis not present

## 2016-02-29 DIAGNOSIS — Z882 Allergy status to sulfonamides status: Secondary | ICD-10-CM

## 2016-02-29 DIAGNOSIS — F419 Anxiety disorder, unspecified: Secondary | ICD-10-CM | POA: Diagnosis present

## 2016-02-29 DIAGNOSIS — M810 Age-related osteoporosis without current pathological fracture: Secondary | ICD-10-CM | POA: Diagnosis present

## 2016-02-29 DIAGNOSIS — K754 Autoimmune hepatitis: Secondary | ICD-10-CM | POA: Diagnosis not present

## 2016-02-29 DIAGNOSIS — Z66 Do not resuscitate: Secondary | ICD-10-CM | POA: Diagnosis not present

## 2016-02-29 DIAGNOSIS — E559 Vitamin D deficiency, unspecified: Secondary | ICD-10-CM | POA: Diagnosis present

## 2016-02-29 DIAGNOSIS — R0602 Shortness of breath: Secondary | ICD-10-CM

## 2016-02-29 DIAGNOSIS — M25531 Pain in right wrist: Secondary | ICD-10-CM | POA: Diagnosis present

## 2016-02-29 DIAGNOSIS — S62109A Fracture of unspecified carpal bone, unspecified wrist, initial encounter for closed fracture: Secondary | ICD-10-CM | POA: Diagnosis present

## 2016-02-29 DIAGNOSIS — R9389 Abnormal findings on diagnostic imaging of other specified body structures: Secondary | ICD-10-CM

## 2016-02-29 DIAGNOSIS — S52502A Unspecified fracture of the lower end of left radius, initial encounter for closed fracture: Secondary | ICD-10-CM | POA: Diagnosis not present

## 2016-02-29 DIAGNOSIS — Z7952 Long term (current) use of systemic steroids: Secondary | ICD-10-CM

## 2016-02-29 DIAGNOSIS — S52611A Displaced fracture of right ulna styloid process, initial encounter for closed fracture: Secondary | ICD-10-CM | POA: Diagnosis not present

## 2016-02-29 DIAGNOSIS — S52501A Unspecified fracture of the lower end of right radius, initial encounter for closed fracture: Secondary | ICD-10-CM | POA: Diagnosis present

## 2016-02-29 DIAGNOSIS — F329 Major depressive disorder, single episode, unspecified: Secondary | ICD-10-CM | POA: Diagnosis present

## 2016-02-29 DIAGNOSIS — Z79899 Other long term (current) drug therapy: Secondary | ICD-10-CM

## 2016-02-29 DIAGNOSIS — S52591A Other fractures of lower end of right radius, initial encounter for closed fracture: Secondary | ICD-10-CM | POA: Diagnosis not present

## 2016-02-29 DIAGNOSIS — S62101A Fracture of unspecified carpal bone, right wrist, initial encounter for closed fracture: Secondary | ICD-10-CM

## 2016-02-29 DIAGNOSIS — Z7982 Long term (current) use of aspirin: Secondary | ICD-10-CM | POA: Diagnosis not present

## 2016-02-29 DIAGNOSIS — W1830XA Fall on same level, unspecified, initial encounter: Secondary | ICD-10-CM | POA: Diagnosis present

## 2016-02-29 DIAGNOSIS — M25511 Pain in right shoulder: Secondary | ICD-10-CM | POA: Diagnosis not present

## 2016-02-29 DIAGNOSIS — S59091A Other physeal fracture of lower end of ulna, right arm, initial encounter for closed fracture: Secondary | ICD-10-CM | POA: Diagnosis not present

## 2016-02-29 LAB — BASIC METABOLIC PANEL
ANION GAP: 8 (ref 5–15)
BUN: 34 mg/dL — ABNORMAL HIGH (ref 6–20)
CALCIUM: 9.3 mg/dL (ref 8.9–10.3)
CO2: 26 mmol/L (ref 22–32)
Chloride: 99 mmol/L — ABNORMAL LOW (ref 101–111)
Creatinine, Ser: 0.74 mg/dL (ref 0.44–1.00)
GFR calc non Af Amer: >60 mL/min (ref >60)
Glucose, Bld: 119 mg/dL — ABNORMAL HIGH (ref 65–99)
Potassium: 3.4 mmol/L — ABNORMAL LOW (ref 3.5–5.1)
Sodium: 133 mmol/L — ABNORMAL LOW (ref 135–145)

## 2016-02-29 LAB — CBC WITH DIFFERENTIAL/PLATELET
BASOS ABS: 0 10*3/uL (ref 0.0–0.1)
BASOS PCT: 0 %
Eosinophils Absolute: 0.3 10*3/uL (ref 0.0–0.7)
Eosinophils Relative: 4 %
HEMATOCRIT: 35.7 % — AB (ref 36.0–46.0)
Hemoglobin: 12.1 g/dL (ref 12.0–15.0)
Lymphocytes Relative: 8 %
Lymphs Abs: 0.7 10*3/uL (ref 0.7–4.0)
MCH: 34 pg (ref 26.0–34.0)
MCHC: 33.9 g/dL (ref 30.0–36.0)
MCV: 100.3 fL — ABNORMAL HIGH (ref 78.0–100.0)
MONO ABS: 0.7 10*3/uL (ref 0.1–1.0)
Monocytes Relative: 8 %
NEUTROS ABS: 7.1 10*3/uL (ref 1.7–7.7)
NEUTROS PCT: 81 %
PLATELETS: UNDETERMINED 10*3/uL (ref 150–400)
RBC: 3.56 MIL/uL — AB (ref 3.87–5.11)
RDW: 13.2 % (ref 11.5–15.5)
WBC: 8.8 10*3/uL (ref 4.0–10.5)

## 2016-02-29 MED ORDER — FAMOTIDINE 20 MG PO TABS: 20.0000 mg | ORAL_TABLET | Freq: Two times a day (BID) | ORAL | Status: DC | PRN

## 2016-02-29 MED ORDER — MORPHINE SULFATE (PF) 2 MG/ML IV SOLN
1.0000 mg | INTRAVENOUS | Status: DC | PRN
  Administered 2016-03-01: 1 mg via INTRAVENOUS
  Filled 2016-02-29: qty 1

## 2016-02-29 MED ORDER — ONDANSETRON HCL 4 MG PO TABS: 4.0000 mg | ORAL_TABLET | Freq: Four times a day (QID) | ORAL | Status: DC | PRN

## 2016-02-29 MED ORDER — ALPRAZOLAM 0.5 MG PO TABS: 0.5000 mg | ORAL_TABLET | Freq: Four times a day (QID) | ORAL | Status: DC | PRN

## 2016-02-29 MED ORDER — LACTATED RINGERS IV SOLN
INTRAVENOUS | Status: DC
  Administered 2016-03-01 (×4): via INTRAVENOUS

## 2016-02-29 MED ORDER — PROMETHAZINE HCL 25 MG RE SUPP
12.5000 mg | Freq: Four times a day (QID) | RECTAL | Status: DC | PRN
  Filled 2016-02-29: qty 1

## 2016-02-29 MED ORDER — ONDANSETRON HCL 4 MG/2ML IJ SOLN: 4.0000 mg | Freq: Four times a day (QID) | INTRAMUSCULAR | Status: DC | PRN

## 2016-02-29 MED ORDER — OXYCODONE HCL 5 MG PO TABS: 5.0000 mg | ORAL_TABLET | ORAL | Status: DC | PRN

## 2016-02-29 MED ORDER — DOCUSATE SODIUM 100 MG PO CAPS
100.0000 mg | ORAL_CAPSULE | Freq: Two times a day (BID) | ORAL | Status: DC
  Administered 2016-03-01 – 2016-03-03 (×4): 100 mg via ORAL
  Filled 2016-02-29 (×4): qty 1

## 2016-02-29 MED ORDER — VITAMIN C 500 MG PO TABS
1000.0000 mg | ORAL_TABLET | Freq: Every day | ORAL | Status: DC
  Administered 2016-03-02 – 2016-03-03 (×2): 1000 mg via ORAL
  Filled 2016-02-29 (×2): qty 2

## 2016-02-29 NOTE — ED Provider Notes (Signed)
[facility]-EMERGENCY DEPT Provider Note   CSN: [id number] Arrival date & time: 02/29/16  1848     History   Chief Complaint Chief Complaint  Patient presents with  . Fall  . Wrist Pain    HPI Marisa Smith is a 83 y.o. female.  83 year old female presents after mechanical fall at her assisted living facility. Was trying to use her walker when she fell onto her right upper extremity. Complains of severe pain to her right shoulder as well as her right wrist. Denies any head injury. No neck pain. No symptoms prior to the event. Denies any back pain hip pain at this time. Pain at the left wrist is sharp and worse with movement. Denies any distal numbness or tingling. Denies any right elbow pain. Is able to move her right shoulder with only mild discomfort. EMS called and patient placed in a splint transported here.      Past Medical History:  Diagnosis Date  . ALLERGIC RHINITIS CAUSE UNSPECIFIED   . ANXIETY   . Autoimmune hepatitis (HCC)    on MP6, prev pred  . DEPRESSION, MAJOR, MODERATE   . Headache(784.0) 02/15/2010  . HYPERTENSION, BENIGN ESSENTIAL   . HYPOTHYROIDISM   . OSTEOPOROSIS    fx L prox hum and L distal radius, both requiring ORIF 02/2013  . Unspecified vitamin D deficiency     Patient Active Problem List   Diagnosis Date Noted  . Proximal left humerus fracture 02/22/2013  . Radius fracture 02/16/2013  . Bradycardia 12/06/2011  . Hyponatremia 11/24/2011  . Hyperglycemia 11/24/2011  . Autoimmune hepatitis (HCC)   . Headache(784.0) 02/15/2010  . TREMOR 02/02/2010  . ANXIETY 12/11/2009  . DEPRESSION, MAJOR, MODERATE 10/19/2009  . HYPOTHYROIDISM 04/26/2009  . Unspecified vitamin D deficiency 04/26/2009  . HYPERTENSION, BENIGN ESSENTIAL 04/26/2009  . OSTEOPOROSIS 04/26/2009    Past Surgical History:  Procedure Laterality Date  . CATARACT EXTRACTION  2008   Left eye  . CATARACT EXTRACTION  2009   Right eye  . HUMERUS IM NAIL Left 02/2013   DR Sol BlazingTEMAN  .  HUMERUS IM NAIL Left 02/24/2013   Procedure: INTRAMEDULLARY (IM) NAIL LEFT PROXIMAL  HUMERUS;  Surgeon: Mable ParisJustin William Chandler, MD;  Location: [facility] OR;  Service: Orthopedics;  Laterality: Left;  . OPEN REDUCTION INTERNAL FIXATION (ORIF) DISTAL RADIAL FRACTURE Left 02/16/2013   Procedure: OPEN REDUCTION INTERNAL FIXATION (ORIF) DISTAL RADIAL FRACTURE;  Surgeon: Sharma CovertFred W Ortmann, MD;  Location: [facility] OR;  Service: Orthopedics;  Laterality: Left;  . TONSILLECTOMY  1945    OB History    No data available       Home Medications    Prior to Admission medications   Medication Sig Start Date End Date Taking? Authorizing Provider  acetaminophen (TYLENOL) 325 MG tablet Take 650 mg by mouth every 4 (four) hours as needed (fever >100).   Yes Historical Provider, MD  aspirin EC 81 MG EC tablet Take 81 mg by mouth daily.    Yes Historical Provider, MD  busPIRone (BUSPAR) 15 MG tablet Take 15 mg by mouth 2 (two) times daily.  12/20/13  Yes Historical Provider, MD  Calcium Carbonate-Vitamin D 600-200 MG-UNIT CAPS Take 1 capsule by mouth daily.   Yes Historical Provider, MD  Cholecalciferol (VITAMIN D3) 1000 units CAPS Take 2,000 Units by mouth daily.   Yes Historical Provider, MD  Eyelid Cleansers (OCUSOFT LID SCRUB) PADS Place 1 each into both eyes 2 (two) times daily.   Yes Historical Provider, MD  Glycerin-Hypromellose-PEG  400 0.2-0.36-1 % SOLN Place 1 drop into both eyes 3 (three) times daily.   Yes Historical Provider, MD  levothyroxine (SYNTHROID, LEVOTHROID) 50 MCG tablet Take 1 tablet (50 mcg total) by mouth daily before breakfast. 05/03/13  Yes Newt Lukes, MD  LORazepam (ATIVAN) 0.5 MG tablet Take 0.5 mg by mouth 2 (two) times daily.  12/22/13  Yes Historical Provider, MD  mercaptopurine (PURINETHOL) 50 MG tablet Take 0.5 tablets (25 mg total) by mouth daily. Give on an empty stomach 1 hour before or 2 hours after meals. Caution: Chemotherapy. 01/05/14  Yes Rachael Fee, MD  mirtazapine  (REMERON) 30 MG tablet Take 30 mg by mouth at bedtime.  12/20/13  Yes Historical Provider, MD  polyethylene glycol (MIRALAX / GLYCOLAX) packet Take 17 g by mouth every other day.   Yes Historical Provider, MD  predniSONE (DELTASONE) 5 MG tablet Take 5 mg by mouth daily with breakfast.   Yes Historical Provider, MD  Propylene Glycol-Glycerin (ARTIFICIAL TEARS) 1-0.3 % SOLN Place 1 drop into both eyes 3 (three) times daily.   Yes Historical Provider, MD  verapamil (VERELAN PM) 120 MG 24 hr capsule Take 120 mg by mouth at bedtime.   Yes Historical Provider, MD  ondansetron (ZOFRAN ODT) 8 MG disintegrating tablet Take 1 tablet (8 mg total) by mouth every 8 (eight) hours as needed for nausea or vomiting. Patient not taking: Reported on 02/29/2016 02/05/16   Azalia Bilis, MD    Family History Family History  Problem Relation Age of Onset  . Hypertension Mother   . Stroke Mother   . Heart disease Father   . Stroke Sister     Social History Social History  Substance Use Topics  . Smoking status: Never Smoker  . Smokeless tobacco: Never Used     Comment: Married, Retired- daughter is Okey Regal Karras-Fifield responsible for majority of supervison for parents  . Alcohol use No     Allergies   Amlodipine besylate; Azatadine; Azathioprine; Penicillins; Sertraline hcl; and Sulfonamide derivatives   Review of Systems Review of Systems  All other systems reviewed and are negative.    Physical Exam Updated Vital Signs BP 175/99 (BP Location: Left Arm)   Pulse 89   Temp 98.1 F (36.7 C) (Oral)   Resp 18   SpO2 95%   Physical Exam  Constitutional: She is oriented to person, place, and time. She appears well-developed and well-nourished.  Non-toxic appearance. No distress.  HENT:  Head: Normocephalic and atraumatic.  Eyes: Conjunctivae, EOM and lids are normal. Pupils are equal, round, and reactive to light.  Neck: Normal range of motion. Neck supple. No tracheal deviation present. No  thyroid mass present.  Cardiovascular: Normal rate, regular rhythm and normal heart sounds.  Exam reveals no gallop.   No murmur heard. Pulmonary/Chest: Effort normal and breath sounds normal. No stridor. No respiratory distress. She has no decreased breath sounds. She has no wheezes. She has no rhonchi. She has no rales.  Abdominal: Soft. Normal appearance and bowel sounds are normal. She exhibits no distension. There is no tenderness. There is no rebound and no CVA tenderness.  Musculoskeletal: Normal range of motion. She exhibits no edema or tenderness.       Arms: Neurological: She is alert and oriented to person, place, and time. She has normal strength. No cranial nerve deficit or sensory deficit. GCS eye subscore is 4. GCS verbal subscore is 5. GCS motor subscore is 6.  Skin: Skin is warm and dry.  No abrasion and no rash noted.  Psychiatric: She has a normal mood and affect. Her speech is normal and behavior is normal.  Nursing note and vitals reviewed.    ED Treatments / Results  Labs (all labs ordered are listed, but only abnormal results are displayed) Labs Reviewed - No data to display  EKG  EKG Interpretation None       Radiology Dg Shoulder Right  Result Date: 02/29/2016 CLINICAL DATA:  Fall tonight with right shoulder pain. EXAM: RIGHT SHOULDER - 2+ VIEW COMPARISON:  None. FINDINGS: Mild diffuse osteopenia. No evidence of acute fracture or dislocation. Degenerative change of the spine. IMPRESSION: No acute fracture or dislocation. Electronically Signed   By: Elberta Fortis M.D.   On: 02/29/2016 19:39   Dg Wrist Complete Right  Result Date: 02/29/2016 CLINICAL DATA:  Fall with left arm/wrist injury. EXAM: RIGHT WRIST - COMPLETE 3+ VIEW COMPARISON:  None. FINDINGS: Exam demonstrates diffuse osteopenia. There are degenerative changes over the wrist and first carpometacarpal joints. There is a displaced and minimally comminuted transverse fracture of the distal radius with  mild impaction. There is likely extension to the articular surface. There is dorsal angulation of the predominant distal fracture fragment. There is a minimally displaced transverse fracture of the distal ulnar metaphyseal region.There is a displaced ulnar styloid fracture. Small vessel atherosclerotic disease is present. IMPRESSION: Displaced slightly comminuted fracture of the distal radial metaphysis likely with extension to the articular surface. Minimally displaced transverse fracture of the distal ulnar metaphysis. Displaced ulnar styloid fracture. Osteopenia with degenerative changes as described. Electronically Signed   By: Elberta Fortis M.D.   On: 02/29/2016 19:38    Procedures Procedures (including critical care time)  Medications Ordered in ED Medications - No data to display   Initial Impression / Assessment and Plan / ED Course  I have reviewed the triage vital signs and the nursing notes.  Pertinent labs & imaging results that were available during my care of the patient were reviewed by me and considered in my medical decision making (see chart for details).  Clinical Course as of Mar 01 2119  Fri Feb 29, 2016  1942 DG Wrist Complete Right [MJ]  1942 DG Wrist Complete Right [MJ]  2107 DG Wrist Complete Right [MJ]  2107 DG Wrist Complete Right [MJ]    Clinical Course User Index [MJ] Rolland Porter, MD    Patient placed in a splint per orthopedic tech. Discussed with Dr. Amanda Pea who will see the patient over to [place] and taken to the operating room tomorrow  Final Clinical Impressions(s) / ED Diagnoses   Final diagnoses:  None    New Prescriptions New Prescriptions   No medications on file     Lorre Nick, MD 02/29/16 2121

## 2016-02-29 NOTE — ED Notes (Signed)
Bed: [id number] Expected date:  Expected time:  Means of arrival:  Comments: EMS 83 yo female fall due to loss of balance due to mechanical issues-neck pain/c-collar-deformity right wrist Fentanyl  IV

## 2016-02-29 NOTE — ED Triage Notes (Addendum)
Per EMS. Pt from [facility] facility. Hx of dementia, but A+O 4x with EMS. Staff reports pt lost balance and fell. R arm twisted behind back on the way down. Pt has wrist deformity and is complaining of shoulder pain. Given 100mcg by ems. Pt reports she was reaching for her walker when she missed the handles and fell. Denies CP, SOB, or LOC.

## 2016-02-29 NOTE — Consult Note (Signed)
  Patient sustained a fall with right distal radius fracture. She's had a prior left distal radius fracture repair in the last 3 years by my partner Dr. Melvyn Novasrtmann. I discussed all issues with the emergency room. We will plan [facility]. I have written orders for such and will move forward with reconstruction of her wrist tomorrow if she remained stable and amendable.  I [facility] all issues [facility] tonight and with Dr. Freida BusmanAllen at [facility].  Vann Okerlund M.D.

## 2016-03-01 ENCOUNTER — Observation Stay (HOSPITAL_COMMUNITY): Payer: Medicare Other | Admitting: Certified Registered Nurse Anesthetist

## 2016-03-01 ENCOUNTER — Encounter (HOSPITAL_COMMUNITY)
Admission: EM | Disposition: A | Discharge status: Home Health | Payer: Self-pay | Source: Home / Self Care | Attending: Orthopedic Surgery

## 2016-03-01 DIAGNOSIS — E039 Hypothyroidism, unspecified: Secondary | ICD-10-CM | POA: Diagnosis not present

## 2016-03-01 DIAGNOSIS — R001 Bradycardia, unspecified: Secondary | ICD-10-CM | POA: Diagnosis not present

## 2016-03-01 DIAGNOSIS — M25531 Pain in right wrist: Secondary | ICD-10-CM | POA: Diagnosis not present

## 2016-03-01 DIAGNOSIS — S52591A Other fractures of lower end of right radius, initial encounter for closed fracture: Secondary | ICD-10-CM | POA: Diagnosis not present

## 2016-03-01 DIAGNOSIS — S52571A Other intraarticular fracture of lower end of right radius, initial encounter for closed fracture: Secondary | ICD-10-CM | POA: Diagnosis not present

## 2016-03-01 DIAGNOSIS — W19XXXA Unspecified fall, initial encounter: Secondary | ICD-10-CM | POA: Diagnosis not present

## 2016-03-01 DIAGNOSIS — R739 Hyperglycemia, unspecified: Secondary | ICD-10-CM | POA: Diagnosis not present

## 2016-03-01 DIAGNOSIS — S52291A Other fracture of shaft of right ulna, initial encounter for closed fracture: Secondary | ICD-10-CM | POA: Diagnosis not present

## 2016-03-01 DIAGNOSIS — S52501A Unspecified fracture of the lower end of right radius, initial encounter for closed fracture: Secondary | ICD-10-CM | POA: Diagnosis not present

## 2016-03-01 HISTORY — PX: ORIF RADIAL FRACTURE: SHX5113

## 2016-03-01 LAB — SURGICAL PCR SCREEN
MRSA, PCR: NEGATIVE
STAPHYLOCOCCUS AUREUS: NEGATIVE

## 2016-03-01 SURGERY — OPEN REDUCTION INTERNAL FIXATION (ORIF) RADIAL FRACTURE
Anesthesia: General | Site: Arm Lower | Laterality: Right

## 2016-03-01 MED ORDER — POLYVINYL ALCOHOL 1.4 % OP SOLN
1.0000 [drp] | Freq: Three times a day (TID) | OPHTHALMIC | Status: DC
  Administered 2016-03-01 – 2016-03-03 (×7): 1 [drp] via OPHTHALMIC
  Filled 2016-03-01: qty 15

## 2016-03-01 MED ORDER — ONDANSETRON 8 MG PO TBDP: 8.0000 mg | ORAL_TABLET | Freq: Three times a day (TID) | ORAL | Status: DC | PRN

## 2016-03-01 MED ORDER — OCUSOFT LID SCRUB EX PADS: 1.0000 | MEDICATED_PAD | Freq: Two times a day (BID) | CUTANEOUS | Status: DC

## 2016-03-01 MED ORDER — DEXAMETHASONE SODIUM PHOSPHATE 4 MG/ML IJ SOLN
INTRAMUSCULAR | Status: DC | PRN
  Administered 2016-03-01: 10 mg via INTRAVENOUS

## 2016-03-01 MED ORDER — LIDOCAINE HCL (CARDIAC) 20 MG/ML IV SOLN
INTRAVENOUS | Status: DC | PRN
  Administered 2016-03-01: 60 mg via INTRAVENOUS

## 2016-03-01 MED ORDER — ONDANSETRON HCL 4 MG/2ML IJ SOLN
INTRAMUSCULAR | Status: DC | PRN
  Administered 2016-03-01: 4 mg via INTRAVENOUS

## 2016-03-01 MED ORDER — MIDAZOLAM HCL 2 MG/2ML IJ SOLN
INTRAMUSCULAR | Status: AC
  Filled 2016-03-01: qty 2

## 2016-03-01 MED ORDER — VANCOMYCIN HCL IN DEXTROSE 1-5 GM/200ML-% IV SOLN
INTRAVENOUS | Status: AC
  Filled 2016-03-01: qty 200

## 2016-03-01 MED ORDER — ALBUMIN HUMAN 5 % IV SOLN
INTRAVENOUS | Status: DC | PRN
  Administered 2016-03-01: 10:00:00 via INTRAVENOUS

## 2016-03-01 MED ORDER — PROPYLENE GLYCOL-GLYCERIN 1-0.3 % OP SOLN
1.0000 [drp] | Freq: Three times a day (TID) | OPHTHALMIC | Status: DC
Start: 2016-03-01 — End: 2016-03-01

## 2016-03-01 MED ORDER — ACETAMINOPHEN 325 MG PO TABS
650.0000 mg | ORAL_TABLET | ORAL | Status: DC | PRN
  Administered 2016-03-01 – 2016-03-03 (×3): 650 mg via ORAL
  Filled 2016-03-01 (×3): qty 2

## 2016-03-01 MED ORDER — POLYETHYLENE GLYCOL 3350 17 G PO PACK
17.0000 g | PACK | ORAL | Status: DC
  Administered 2016-03-01 – 2016-03-03 (×2): 17 g via ORAL
  Filled 2016-03-01 (×2): qty 1

## 2016-03-01 MED ORDER — LORAZEPAM 0.5 MG PO TABS
0.5000 mg | ORAL_TABLET | Freq: Two times a day (BID) | ORAL | Status: DC
  Administered 2016-03-01 – 2016-03-03 (×5): 0.5 mg via ORAL
  Filled 2016-03-01 (×5): qty 1

## 2016-03-01 MED ORDER — MERCAPTOPURINE 50 MG PO TABS
25.0000 mg | ORAL_TABLET | Freq: Every day | ORAL | Status: DC
  Administered 2016-03-01 – 2016-03-03 (×3): 25 mg via ORAL
  Filled 2016-03-01 (×3): qty 1

## 2016-03-01 MED ORDER — VANCOMYCIN HCL 1000 MG IV SOLR
INTRAVENOUS | Status: DC | PRN
  Administered 2016-03-01: 1000 mg via INTRAVENOUS

## 2016-03-01 MED ORDER — PHENYLEPHRINE HCL 10 MG/ML IJ SOLN
INTRAMUSCULAR | Status: DC | PRN
  Administered 2016-03-01 (×2): 80 ug via INTRAVENOUS

## 2016-03-01 MED ORDER — ROCURONIUM BROMIDE 100 MG/10ML IV SOLN
INTRAVENOUS | Status: DC | PRN
  Administered 2016-03-01: 30 mg via INTRAVENOUS

## 2016-03-01 MED ORDER — SUGAMMADEX SODIUM 200 MG/2ML IV SOLN
INTRAVENOUS | Status: DC | PRN
  Administered 2016-03-01: 150 mg via INTRAVENOUS

## 2016-03-01 MED ORDER — FENTANYL CITRATE (PF) 100 MCG/2ML IJ SOLN
INTRAMUSCULAR | Status: AC
  Filled 2016-03-01: qty 2

## 2016-03-01 MED ORDER — VANCOMYCIN HCL IN DEXTROSE 750-5 MG/150ML-% IV SOLN
750.0000 mg | Freq: Once | INTRAVENOUS | Status: AC
  Administered 2016-03-02: 750 mg via INTRAVENOUS
  Filled 2016-03-01: qty 150

## 2016-03-01 MED ORDER — FENTANYL CITRATE (PF) 100 MCG/2ML IJ SOLN: 25.0000 ug | INTRAMUSCULAR | Status: DC | PRN

## 2016-03-01 MED ORDER — PROPOFOL 10 MG/ML IV BOLUS
INTRAVENOUS | Status: DC | PRN
  Administered 2016-03-01: 100 mg via INTRAVENOUS

## 2016-03-01 MED ORDER — ASPIRIN EC 81 MG PO TBEC
81.0000 mg | DELAYED_RELEASE_TABLET | Freq: Every day | ORAL | Status: DC
  Administered 2016-03-01 – 2016-03-03 (×3): 81 mg via ORAL
  Filled 2016-03-01 (×3): qty 1

## 2016-03-01 MED ORDER — LEVOTHYROXINE SODIUM 50 MCG PO TABS
50.0000 ug | ORAL_TABLET | Freq: Every day | ORAL | Status: DC
  Administered 2016-03-02 – 2016-03-03 (×2): 50 ug via ORAL
  Filled 2016-03-01 (×2): qty 1

## 2016-03-01 MED ORDER — BUSPIRONE HCL 15 MG PO TABS
15.0000 mg | ORAL_TABLET | Freq: Two times a day (BID) | ORAL | Status: DC
  Administered 2016-03-01 – 2016-03-03 (×5): 15 mg via ORAL
  Filled 2016-03-01: qty 3
  Filled 2016-03-01 (×3): qty 1
  Filled 2016-03-01: qty 3
  Filled 2016-03-01: qty 1
  Filled 2016-03-01: qty 3
  Filled 2016-03-01: qty 1
  Filled 2016-03-01: qty 3
  Filled 2016-03-01: qty 1

## 2016-03-01 MED ORDER — PREDNISONE 5 MG PO TABS
5.0000 mg | ORAL_TABLET | Freq: Every day | ORAL | Status: DC
  Administered 2016-03-01 – 2016-03-03 (×3): 5 mg via ORAL
  Filled 2016-03-01 (×3): qty 1

## 2016-03-01 MED ORDER — 0.9 % SODIUM CHLORIDE (POUR BTL) OPTIME
TOPICAL | Status: DC | PRN
  Administered 2016-03-01: 1000 mL

## 2016-03-01 MED ORDER — FENTANYL CITRATE (PF) 100 MCG/2ML IJ SOLN
INTRAMUSCULAR | Status: DC | PRN
  Administered 2016-03-01 (×2): 50 ug via INTRAVENOUS

## 2016-03-01 MED ORDER — MIRTAZAPINE 30 MG PO TABS
30.0000 mg | ORAL_TABLET | Freq: Every day | ORAL | Status: DC
  Administered 2016-03-01 – 2016-03-02 (×2): 30 mg via ORAL
  Filled 2016-03-01: qty 2
  Filled 2016-03-01 (×3): qty 1
  Filled 2016-03-01: qty 2

## 2016-03-01 MED ORDER — PROPOFOL 10 MG/ML IV BOLUS
INTRAVENOUS | Status: AC
  Filled 2016-03-01: qty 20

## 2016-03-01 MED ORDER — CALCIUM CARBONATE-VITAMIN D 500-200 MG-UNIT PO TABS
1.0000 | ORAL_TABLET | Freq: Every day | ORAL | Status: DC
  Administered 2016-03-01 – 2016-03-03 (×3): 1 via ORAL
  Filled 2016-03-01 (×3): qty 1

## 2016-03-01 MED ORDER — VITAMIN D3 25 MCG (1000 UNIT) PO TABS
2000.0000 [IU] | ORAL_TABLET | Freq: Every day | ORAL | Status: DC
  Administered 2016-03-01 – 2016-03-03 (×3): 2000 [IU] via ORAL
  Filled 2016-03-01 (×6): qty 2

## 2016-03-01 SURGICAL SUPPLY — 67 items
BANDAGE ACE 4X5 VEL STRL LF (GAUZE/BANDAGES/DRESSINGS) ×3 IMPLANT
BANDAGE ELASTIC 3 VELCRO ST LF (GAUZE/BANDAGES/DRESSINGS) ×3 IMPLANT
BANDAGE ELASTIC 4 VELCRO ST LF (GAUZE/BANDAGES/DRESSINGS) ×3 IMPLANT
BANDAGE ELASTIC 6 VELCRO ST LF (GAUZE/BANDAGES/DRESSINGS) ×3 IMPLANT
BIT DRILL 2.2 SS TIBIAL (BIT) ×3 IMPLANT
BLADE SURG ROTATE 9660 (MISCELLANEOUS) IMPLANT
BNDG ESMARK 4X9 LF (GAUZE/BANDAGES/DRESSINGS) ×3 IMPLANT
BNDG GAUZE ELAST 4 BULKY (GAUZE/BANDAGES/DRESSINGS) ×9 IMPLANT
CORDS BIPOLAR (ELECTRODE) ×3 IMPLANT
COVER SURGICAL LIGHT HANDLE (MISCELLANEOUS) ×3 IMPLANT
CUFF TOURNIQUET SINGLE 18IN (TOURNIQUET CUFF) ×3 IMPLANT
CUFF TOURNIQUET SINGLE 24IN (TOURNIQUET CUFF) IMPLANT
DECANTER SPIKE VIAL GLASS SM (MISCELLANEOUS) IMPLANT
DRAIN TLS ROUND 10FR (DRAIN) IMPLANT
DRAPE INCISE IOBAN 66X45 STRL (DRAPES) IMPLANT
DRAPE OEC MINIVIEW 54X84 (DRAPES) ×3 IMPLANT
DRAPE U-SHAPE 47X51 STRL (DRAPES) ×3 IMPLANT
DRESSING ADAPTIC 1/2  N-ADH (PACKING) ×3 IMPLANT
ELECT REM PT RETURN 9FT ADLT (ELECTROSURGICAL)
ELECTRODE REM PT RTRN 9FT ADLT (ELECTROSURGICAL) IMPLANT
GAUZE SPONGE 4X4 12PLY STRL (GAUZE/BANDAGES/DRESSINGS) ×3 IMPLANT
GAUZE XEROFORM 1X8 LF (GAUZE/BANDAGES/DRESSINGS) ×6 IMPLANT
GLOVE BIOGEL M 8.0 STRL (GLOVE) ×3 IMPLANT
GLOVE SS BIOGEL STRL SZ 8 (GLOVE) ×1 IMPLANT
GLOVE SUPERSENSE BIOGEL SZ 8 (GLOVE) ×2
GOWN STRL REUS W/ TWL LRG LVL3 (GOWN DISPOSABLE) ×3 IMPLANT
GOWN STRL REUS W/ TWL XL LVL3 (GOWN DISPOSABLE) ×3 IMPLANT
GOWN STRL REUS W/TWL LRG LVL3 (GOWN DISPOSABLE) ×6
GOWN STRL REUS W/TWL XL LVL3 (GOWN DISPOSABLE) ×6
KIT BASIN OR (CUSTOM PROCEDURE TRAY) ×3 IMPLANT
KIT ROOM TURNOVER OR (KITS) ×3 IMPLANT
LOOP VESSEL MAXI BLUE (MISCELLANEOUS) ×3 IMPLANT
MANIFOLD NEPTUNE II (INSTRUMENTS) ×3 IMPLANT
NEEDLE 22X1 1/2 (OR ONLY) (NEEDLE) IMPLANT
NEEDLE BLUNT 16X1.5 OR ONLY (NEEDLE) IMPLANT
NS IRRIG 1000ML POUR BTL (IV SOLUTION) ×3 IMPLANT
PACK ORTHO EXTREMITY (CUSTOM PROCEDURE TRAY) ×3 IMPLANT
PAD ARMBOARD 7.5X6 YLW CONV (MISCELLANEOUS) ×6 IMPLANT
PAD CAST 4YDX4 CTTN HI CHSV (CAST SUPPLIES) ×1 IMPLANT
PADDING CAST ABS 6INX4YD NS (CAST SUPPLIES) ×2
PADDING CAST ABS COTTON 6X4 NS (CAST SUPPLIES) ×1 IMPLANT
PADDING CAST COTTON 4X4 STRL (CAST SUPPLIES) ×2
PEG LOCKING SMOOTH 2.2X20 (Screw) ×12 IMPLANT
PEG LOCKING SMOOTH 2.2X22 (Screw) ×6 IMPLANT
PLATE NARROW DVR RIGHT (Plate) ×3 IMPLANT
PUTTY DBM STAGRAFT PLUS 5CC (Putty) ×3 IMPLANT
SCREW LOCK 14X2.7X 3 LD TPR (Screw) ×3 IMPLANT
SCREW LOCKING 2.7X13MM (Screw) ×3 IMPLANT
SCREW LOCKING 2.7X14 (Screw) ×6 IMPLANT
SCREW LOCKING 2.7X15MM (Screw) ×3 IMPLANT
SPONGE GAUZE 4X4 12PLY STER LF (GAUZE/BANDAGES/DRESSINGS) ×3 IMPLANT
SPONGE LAP 4X18 X RAY DECT (DISPOSABLE) ×3 IMPLANT
STAPLER VISISTAT 35W (STAPLE) IMPLANT
SUCTION FRAZIER HANDLE 10FR (MISCELLANEOUS) ×2
SUCTION TUBE FRAZIER 10FR DISP (MISCELLANEOUS) ×1 IMPLANT
SUT ETHILON 4 0 PS 2 18 (SUTURE) IMPLANT
SUT PROLENE 4 0 PS 2 18 (SUTURE) ×6 IMPLANT
SUT VIC AB 3-0 FS2 27 (SUTURE) ×3 IMPLANT
SYR CONTROL 10ML LL (SYRINGE) IMPLANT
SYSTEM CHEST DRAIN TLS 7FR (DRAIN) ×3 IMPLANT
TOWEL OR 17X24 6PK STRL BLUE (TOWEL DISPOSABLE) ×3 IMPLANT
TOWEL OR 17X26 10 PK STRL BLUE (TOWEL DISPOSABLE) ×3 IMPLANT
TUBE CONNECTING 12'X1/4 (SUCTIONS) ×1
TUBE CONNECTING 12X1/4 (SUCTIONS) ×2 IMPLANT
TUBE EVACUATION TLS (MISCELLANEOUS) ×3 IMPLANT
WATER STERILE IRR 1000ML POUR (IV SOLUTION) ×3 IMPLANT
YANKAUER SUCT BULB TIP NO VENT (SUCTIONS) IMPLANT

## 2016-03-01 NOTE — Anesthesia Postprocedure Evaluation (Signed)
Anesthesia Post Note  Patient: Marisa Smith  Procedure(s) Performed: Procedure(s) (LRB): OPEN REDUCTION INTERNAL FIXATION (ORIF) RADIUS FRACTURE (Right)  Patient location during evaluation: PACU Anesthesia Type: General Level of consciousness: awake Pain management: pain level controlled Respiratory status: spontaneous breathing Cardiovascular status: stable Anesthetic complications: no       Last Vitals:  Vitals:   03/01/16 1125 03/01/16 1147  BP:  (!) 144/72  Pulse:  75  Resp:  18  Temp: 36.3 C 36.8 C    Last Pain:  Vitals:   03/01/16 0745  TempSrc:   PainSc: Asleep                 Gibbs Naugle

## 2016-03-01 NOTE — Progress Notes (Signed)
Pharmacy Antibiotic Note  Marisa Smith is a 83 y.o. female admitted on 02/29/2016 after a mechanical fall causing a right wrist fracture.  Now s/p ORIF and Pharmacy has been consulted for vancomycin dosing for 24-36 hours post-op for surgical prophylaxis (confirmed with MD).  Baseline labs reviewed and noted that patient received vancomycin 1gm IV around 0930 today.   Plan: - Vanc 750mg  IV x 1 dose tomorrow at 0930 - Pharmacy will sign off as dosage adjustment is unnecessary.  Thank you for the consult!    Temp (24hrs), Avg:98.3 F (36.8 C), Min:97.3 F (36.3 C), Max:99.5 F (37.5 C)   Recent Labs Lab 02/29/16 2210  WBC 8.8  CREATININE 0.74    CrCl cannot be calculated (Unknown ideal weight.).    Allergies  Allergen Reactions  . Amlodipine Besylate     REACTION: itching  . Azatadine     Per MAR?  [name] Kitchen. Azathioprine     REACTION: nausea  . Penicillins     REACTION: yeast infection Per MAR  . Sertraline Hcl     REACTION: rash  . Sulfonamide Derivatives     REACTION: swelling and itching      Kyrollos Cordell D. Laney Potashang, PharmD, BCPS Pager:  [phone] 03/01/2016, 11:43 AM

## 2016-03-01 NOTE — H&P (Signed)
Marisa Smith is an 83 y.o. female.   Chief Complaint: right wrist fracture displaced status post fall HPI: patient presents with a displaced right wrist fracture status post fall. Patient notes no locking catching numbness or tingling. I've spoke with her family in regards to her upper extremity predicament.  Patient desires to proceed with surgical algorithm of care including ORIF right wrist. I discussed with patient risk and benefits and other issues. I discussed with her family the issues at hand and obtain consent.  The family wishes her to be a DO NOT RESUSCITATE  She denies lower extremity pain neck back or chest pain  Past Medical History:  Diagnosis Date  . ALLERGIC RHINITIS CAUSE UNSPECIFIED   . ANXIETY   . Autoimmune hepatitis (HCC)    on MP6, prev pred  . DEPRESSION, MAJOR, MODERATE   . Headache(784.0) 02/15/2010  . HYPERTENSION, BENIGN ESSENTIAL   . HYPOTHYROIDISM   . OSTEOPOROSIS    fx L prox hum and L distal radius, both requiring ORIF 02/2013  . Unspecified vitamin D deficiency     Past Surgical History:  Procedure Laterality Date  . CATARACT EXTRACTION  2008   Left eye  . CATARACT EXTRACTION  2009   Right eye  . HUMERUS IM NAIL Left 02/2013   DR Sol Blazing  . HUMERUS IM NAIL Left 02/24/2013   Procedure: INTRAMEDULLARY (IM) NAIL LEFT PROXIMAL  HUMERUS;  Surgeon: Mable Paris, MD;  Location: [facility] OR;  Service: Orthopedics;  Laterality: Left;  . OPEN REDUCTION INTERNAL FIXATION (ORIF) DISTAL RADIAL FRACTURE Left 02/16/2013   Procedure: OPEN REDUCTION INTERNAL FIXATION (ORIF) DISTAL RADIAL FRACTURE;  Surgeon: Sharma Covert, MD;  Location: [facility] OR;  Service: Orthopedics;  Laterality: Left;  . TONSILLECTOMY  1945    Family History  Problem Relation Age of Onset  . Hypertension Mother   . Stroke Mother   . Heart disease Father   . Stroke Sister    Social History:  reports that she has never smoked. She has never used smokeless tobacco. She reports that she  does not drink alcohol or use drugs.  Allergies:  Allergies  Allergen Reactions  . Amlodipine Besylate     REACTION: itching  . Azatadine     Per MAR?  [name] Kitchen Azathioprine     REACTION: nausea  . Penicillins     REACTION: yeast infection Per MAR  . Sertraline Hcl     REACTION: rash  . Sulfonamide Derivatives     REACTION: swelling and itching    Medications Prior to Admission  Medication Sig Dispense Refill  . acetaminophen (TYLENOL) 325 MG tablet Take 650 mg by mouth every 4 (four) hours as needed (fever >100).    [name] Kitchen aspirin EC 81 MG EC tablet Take 81 mg by mouth daily.     . busPIRone (BUSPAR) 15 MG tablet Take 15 mg by mouth 2 (two) times daily.   10  . Calcium Carbonate-Vitamin D 600-200 MG-UNIT CAPS Take 1 capsule by mouth daily.    . Cholecalciferol (VITAMIN D3) 1000 units CAPS Take 2,000 Units by mouth daily.    . Eyelid Cleansers (OCUSOFT LID SCRUB) PADS Place 1 each into both eyes 2 (two) times daily.    . Glycerin-Hypromellose-PEG 400 0.2-0.36-1 % SOLN Place 1 drop into both eyes 3 (three) times daily.    [name] Kitchen levothyroxine (SYNTHROID, LEVOTHROID) 50 MCG tablet Take 1 tablet (50 mcg total) by mouth daily before breakfast. 30 tablet 5  . LORazepam (ATIVAN) 0.5  MG tablet Take 0.5 mg by mouth 2 (two) times daily.   4  . mercaptopurine (PURINETHOL) 50 MG tablet Take 0.5 tablets (25 mg total) by mouth daily. Give on an empty stomach 1 hour before or 2 hours after meals. Caution: Chemotherapy. 30 tablet 11  . mirtazapine (REMERON) 30 MG tablet Take 30 mg by mouth at bedtime.   10  . polyethylene glycol (MIRALAX / GLYCOLAX) packet Take 17 g by mouth every other day.    . predniSONE (DELTASONE) 5 MG tablet Take 5 mg by mouth daily with breakfast.    . Propylene Glycol-Glycerin (ARTIFICIAL TEARS) 1-0.3 % SOLN Place 1 drop into both eyes 3 (three) times daily.    . verapamil (VERELAN PM) 120 MG 24 hr capsule Take 120 mg by mouth at bedtime.    . ondansetron (ZOFRAN ODT) 8 MG  disintegrating tablet Take 1 tablet (8 mg total) by mouth every 8 (eight) hours as needed for nausea or vomiting. (Patient not taking: Reported on 02/29/2016) 10 tablet 0    Results for orders placed or performed during the hospital encounter of 02/29/16 (from the past 48 hour(s))  CBC with Differential/Platelet     Status: Abnormal   Collection Time: 02/29/16 10:10 PM  Result Value Ref Range   WBC 8.8 4.0 - 10.5 K/uL   RBC 3.56 (L) 3.87 - 5.11 MIL/uL   Hemoglobin 12.1 12.0 - 15.0 g/dL   HCT 35.7 (L) 36.0 - 46.0 %   MCV 100.3 (H) 78.0 - 100.0 fL   MCH 34.0 26.0 - 34.0 pg   MCHC 33.9 30.0 - 36.0 g/dL   RDW 13.2 11.5 - 15.5 %   Platelets PLATELET CLUMPS NOTED ON SMEAR, UNABLE TO ESTIMATE 150 - 400 K/uL   Neutrophils Relative % 81 %   Neutro Abs 7.1 1.7 - 7.7 K/uL   Lymphocytes Relative 8 %   Lymphs Abs 0.7 0.7 - 4.0 K/uL   Monocytes Relative 8 %   Monocytes Absolute 0.7 0.1 - 1.0 K/uL   Eosinophils Relative 4 %   Eosinophils Absolute 0.3 0.0 - 0.7 K/uL   Basophils Relative 0 %   Basophils Absolute 0.0 0.0 - 0.1 K/uL  Basic metabolic panel     Status: Abnormal   Collection Time: 02/29/16 10:10 PM  Result Value Ref Range   Sodium 133 (L) 135 - 145 mmol/L   Potassium 3.4 (L) 3.5 - 5.1 mmol/L   Chloride 99 (L) 101 - 111 mmol/L   CO2 26 22 - 32 mmol/L   Glucose, Bld 119 (H) 65 - 99 mg/dL   BUN 34 (H) 6 - 20 mg/dL   Creatinine, Ser 0.74 0.44 - 1.00 mg/dL   Calcium 9.3 8.9 - 10.3 mg/dL   GFR calc non Af Amer >60 >60 mL/min   GFR calc Af Amer >60 >60 mL/min    Comment: (NOTE) The eGFR has been calculated using the CKD EPI equation. This calculation has not been validated in all clinical situations. eGFR's persistently <60 mL/min signify possible Chronic Kidney Disease.    Anion gap 8 5 - 15  Surgical PCR screen     Status: None   Collection Time: 03/01/16  1:04 AM  Result Value Ref Range   MRSA, PCR NEGATIVE NEGATIVE   Staphylococcus aureus NEGATIVE NEGATIVE    Comment:         The Xpert SA Assay (FDA approved for NASAL specimens in patients over 71 years of age), is one component of a  comprehensive surveillance program.  Test performance has been validated by [facility] for patients greater than or equal to 49 year old. It is not intended to diagnose infection nor to guide or monitor treatment.    Dg Chest 2 View  Result Date: 02/29/2016 CLINICAL DATA:  Patient fell today.  Shortness of breath. EXAM: CHEST  2 VIEW COMPARISON:  05/28/2013. FINDINGS: Lungs are hyperexpanded. Interstitial markings are diffusely coarsened with chronic features. New right upper lobe nodular opacity is evident. Cardiopericardial silhouette is at upper limits of normal for size. The visualized bony structures of the thorax are intact. Patient is status post ORIF for proximal left humerus fracture. IMPRESSION: Emphysema with underlying chronic interstitial changes. New ill-defined nodular density right upper lobe. CT chest recommended to further evaluate. Electronically Signed   By: Kennith Center M.D.   On: 02/29/2016 22:01   Dg Shoulder Right  Result Date: 02/29/2016 CLINICAL DATA:  Fall tonight with right shoulder pain. EXAM: RIGHT SHOULDER - 2+ VIEW COMPARISON:  None. FINDINGS: Mild diffuse osteopenia. No evidence of acute fracture or dislocation. Degenerative change of the spine. IMPRESSION: No acute fracture or dislocation. Electronically Signed   By: Elberta Fortis M.D.   On: 02/29/2016 19:39   Dg Wrist Complete Right  Result Date: 02/29/2016 CLINICAL DATA:  Fall with left arm/wrist injury. EXAM: RIGHT WRIST - COMPLETE 3+ VIEW COMPARISON:  None. FINDINGS: Exam demonstrates diffuse osteopenia. There are degenerative changes over the wrist and first carpometacarpal joints. There is a displaced and minimally comminuted transverse fracture of the distal radius with mild impaction. There is likely extension to the articular surface. There is dorsal angulation of the predominant distal  fracture fragment. There is a minimally displaced transverse fracture of the distal ulnar metaphyseal region.There is a displaced ulnar styloid fracture. Small vessel atherosclerotic disease is present. IMPRESSION: Displaced slightly comminuted fracture of the distal radial metaphysis likely with extension to the articular surface. Minimally displaced transverse fracture of the distal ulnar metaphysis. Displaced ulnar styloid fracture. Osteopenia with degenerative changes as described. Electronically Signed   By: Elberta Fortis M.D.   On: 02/29/2016 19:38    Review of Systems  Constitutional: Negative.   HENT: Negative.   Eyes: Negative.   Respiratory: Negative.   Gastrointestinal: Negative.   Genitourinary: Negative.     Blood pressure (!) 141/54, pulse 79, temperature 98.7 F (37.1 C), temperature source Oral, resp. rate 16, SpO2 95 %. Physical Exam displaced right wrist fracture intact to sensation and refill. Neck and back are nontender  The patient is alert and oriented in no acute distress. The patient complains of pain in the affected upper extremity.  The patient is noted to have a normal HEENT exam. Lung fields show equal chest expansion and no shortness of breath. Abdomen exam is nontender without distention. Lower extremity examination does not show any fracture dislocation or blood clot symptoms. Pelvis is stable and the neck and back are stable and nontender.  Assessment/Plan We will plan for open reduction internal fixation displaced right wrist fracture. All questions have been encouraged and answered.  We are planning surgery for your upper extremity. The risk and benefits of surgery to include risk of bleeding, infection, anesthesia,  damage to normal structures and failure of the surgery to accomplish its intended goals of relieving symptoms and restoring function have been discussed in detail. With this in mind we plan to proceed. I have specifically discussed with the  patient the pre-and postoperative regime and the dos and  don'ts and risk and benefits in great detail. Risk and benefits of surgery also include risk of dystrophy(CRPS), chronic nerve pain, failure of the healing process to go onto completion and other inherent risks of surgery The relavent the pathophysiology of the disease/injury process, as well as the alternatives for treatment and postoperative course of action has been discussed in great detail with the patient who desires to proceed.  We will do everything in our power to help you (the patient) restore function to the upper extremity. It is a pleasure to see this patient today.   Paulene Floor, MD 03/01/2016, 8:29 AM

## 2016-03-01 NOTE — Op Note (Signed)
See op [id number]  Status post ORIF right radius  Mitchell Epling MD

## 2016-03-01 NOTE — Anesthesia Procedure Notes (Signed)
Procedure Name: Intubation Date/Time: 03/01/2016 9:17 AM Performed by: Reine JustFLOWERS, Nil Bolser T Pre-anesthesia Checklist: Patient identified, Emergency Drugs available, Suction available, Patient being monitored and Timeout performed Patient Re-evaluated:Patient Re-evaluated prior to inductionOxygen Delivery Method: Circle system utilized and Simple face mask Preoxygenation: Pre-oxygenation with 100% oxygen Intubation Type: IV induction Ventilation: Mask ventilation without difficulty Laryngoscope Size: Miller and 2 Grade View: Grade II Tube type: Oral Tube size: 7.5 mm Number of attempts: 1 Airway Equipment and Method: Patient positioned with wedge pillow and Stylet Placement Confirmation: ETT inserted through vocal cords under direct vision,  positive ETCO2 and breath sounds checked- equal and bilateral Secured at: 21 cm Tube secured with: Tape Dental Injury: Teeth and Oropharynx as per pre-operative assessment

## 2016-03-01 NOTE — Anesthesia Preprocedure Evaluation (Addendum)
Anesthesia Evaluation  Patient identified by MRN, date of birth, ID band Patient awake    Airway Mallampati: II  TM Distance: >3 FB     Dental   Pulmonary    breath sounds clear to auscultation       Cardiovascular hypertension,  Rhythm:Regular Rate:Normal     Neuro/Psych  Headaches,    GI/Hepatic negative GI ROS, (+) Hepatitis -  Endo/Other  Hypothyroidism   Renal/GU      Musculoskeletal   Abdominal   Peds  Hematology   Anesthesia Other Findings   Reproductive/Obstetrics                             Anesthesia Physical Anesthesia Plan  ASA: III  Anesthesia Plan: General   Post-op Pain Management:    Induction: Intravenous  Airway Management Planned: Oral ETT  Additional Equipment:   Intra-op Plan:   Post-operative Plan: Possible Post-op intubation/ventilation  Informed Consent: I have reviewed the patients History and Physical, chart, labs and discussed the procedure including the risks, benefits and alternatives for the proposed anesthesia with the patient or authorized representative who has indicated his/her understanding and acceptance.   Dental advisory given  Plan Discussed with: CRNA and Anesthesiologist  Anesthesia Plan Comments:         Anesthesia Quick Evaluation

## 2016-03-01 NOTE — Transfer of Care (Signed)
Immediate Anesthesia Transfer of Care Note  Patient: Marisa Smith  Procedure(s) Performed: Procedure(s): OPEN REDUCTION INTERNAL FIXATION (ORIF) RADIUS FRACTURE (Right)  Patient Location: PACU  Anesthesia Type:General  Level of Consciousness: awake and alert   Airway & Oxygen Therapy: Patient Spontanous Breathing and Patient connected to nasal cannula oxygen  Post-op Assessment: Report given to RN and Post -op Vital signs reviewed and stable  Post vital signs: Reviewed and stable  Last Vitals:  Vitals:   03/01/16 0058 03/01/16 0348  BP: (!) 157/91 (!) 141/54  Pulse: 96 79  Resp: 16   Temp: 37.5 C 37.1 C    Last Pain:  Vitals:   03/01/16 0745  TempSrc:   PainSc: Asleep      Patients Stated Pain Goal: 0 (03/01/16 0425)  Complications: No apparent anesthesia complications

## 2016-03-02 ENCOUNTER — Observation Stay (HOSPITAL_COMMUNITY): Payer: Medicare Other

## 2016-03-02 DIAGNOSIS — J90 Pleural effusion, not elsewhere classified: Secondary | ICD-10-CM | POA: Diagnosis not present

## 2016-03-02 DIAGNOSIS — S52591A Other fractures of lower end of right radius, initial encounter for closed fracture: Secondary | ICD-10-CM | POA: Diagnosis not present

## 2016-03-02 DIAGNOSIS — M25531 Pain in right wrist: Secondary | ICD-10-CM | POA: Diagnosis not present

## 2016-03-02 DIAGNOSIS — J9811 Atelectasis: Secondary | ICD-10-CM | POA: Diagnosis not present

## 2016-03-02 NOTE — Progress Notes (Signed)
Patient has been seen and examined. Patient has pain appropriate to his injury/process. Patient denies new complaints at this present time. I have discussed the care pathway with nursing staff. Patient is appropriate and alert.  We reviewed vital signs and intake output which are stable.  The upper extremity is neurovascularly intact. Refill is normal. There is no signs of compartment syndrome. There is no signs of dystrophy. There is normal sensation.  I have spent a  great deal of time discussing range of motion edema control and other techniques to decrease edema and promote flexion extension of the fingers. Patient understands the importance of elevation range of motion massage and other measures to lessen pain and prevent swelling.  We have also discussed immobilization to appropriate areas involved.  We have discussed with the patient shoulder range of motion to prevent adhesive capsulitis.  The remainder of the examination is normal today without complicating feature.  Drain was removed without difficulty  I discussed with the family all issues.  Given her abnormal chest x-ray I've ordered a CT scan of her chest to rule out right upper lobe abnormality.  Family is in agreement.  Full watch her closely. Overall she looks well today and is having no complications Lisle Skillman MDPatient ID: Marisa MuttonAnnie S Smith, female   DOB: [birth year], 83 y.o.   MRN: [id number]

## 2016-03-03 ENCOUNTER — Encounter (HOSPITAL_COMMUNITY): Payer: Self-pay | Admitting: Orthopedic Surgery

## 2016-03-03 DIAGNOSIS — S62102A Fracture of unspecified carpal bone, left wrist, initial encounter for closed fracture: Secondary | ICD-10-CM | POA: Diagnosis not present

## 2016-03-03 DIAGNOSIS — S52023B Displaced fracture of olecranon process without intraarticular extension of unspecified ulna, initial encounter for open fracture type I or II: Secondary | ICD-10-CM | POA: Diagnosis not present

## 2016-03-03 DIAGNOSIS — S52591A Other fractures of lower end of right radius, initial encounter for closed fracture: Secondary | ICD-10-CM | POA: Diagnosis not present

## 2016-03-03 DIAGNOSIS — M25531 Pain in right wrist: Secondary | ICD-10-CM | POA: Diagnosis not present

## 2016-03-03 MED ORDER — HYDROCODONE-ACETAMINOPHEN 5-325 MG PO TABS: 1.0000 | ORAL_TABLET | Freq: Four times a day (QID) | ORAL | 0 refills | Status: DC | PRN

## 2016-03-03 NOTE — Progress Notes (Signed)
Patient will DC to: [facility] Anticipated DC date: 03/03/16 Family notified: Daughter Transport by: Sharin MonsPTAR   Per MD [facility]. RN, patient, patient's family, and facility notified of DC. Discharge Summary sent to facility. RN given number for report. DC packet on chart. Ambulance transport requested for patient.   CSW signing off.  Cristobal GoldmannNadia Brendolyn Stockley, [place] Clinical Social Worker [phone]

## 2016-03-03 NOTE — Discharge Instructions (Signed)
Please elevate the right upper extremity frequently and encourage frequent finger range of motion. Encourage active flexion and extension. Massage digits and dorsal aspect of the hand as needed for swelling. In terms of weightbearing issues to the right upper extremity the patient may use her hand for very light ADLs no significant weightbearing otherwise. She can utilize a platform walker extension bear weight to the right elbow and proximal forearm region for ambulatory purposes. Please keep the splint clean dry and intact, do not remove and do not get wet.Keep bandage clean and dry.  Call for any problems.  No smoking.  Criteria for driving a car: you should be off your pain medicine for 7-8 hours, able to drive one handed(confident), thinking clearly and feeling able in your judgement to drive. Continue elevation as it will decrease swelling.  If instructed by MD move your fingers within the confines of the bandage/splint.  Use ice if instructed by your MD. Call immediately for any sudden loss of feeling in your hand/arm or change in functional abilities of the extremity. We recommend that you to take vitamin C 1000 mg a day to promote healing. We also recommend that if you require  pain medicine that you take a stool softener to prevent constipation as most pain medicines will have constipation side effects. We recommend either Peri-Colace or Senokot and recommend that you also consider adding MiraLAX as well to prevent the constipation affects from pain medicine if you are required to use them. These medicines are over the counter and may be purchased at a local pharmacy. A cup of yogurt and a probiotic can also be helpful during the recovery process as the medicines can disrupt your intestinal environment.

## 2016-03-03 NOTE — Care Management Obs Status (Signed)
MEDICARE OBSERVATION STATUS NOTIFICATION   Patient Details  Name: Marisa Smith MRN: [id number] Date of Birth: 11-07-1924   Medicare Observation Status Notification Given:  Yes Case manager contacted Patient's daughter, who is POA: Cira ServantCarol Fifield at [phone] to explain Medicare Observation Notice. She has no objections.   [name], [name] [name], RN 03/03/2016, 11:41 AM

## 2016-03-03 NOTE — Clinical Social Work Note (Signed)
Clinical Social Work Assessment  Patient Details  Name: Marisa Smith MRN: [id number] Date of Birth: [birth year]  Date of referral:  03/03/16               Reason for consult:  Discharge Planning                Permission sought to share information with:  Facility Medical sales representativeContact Representative, Family Supports Permission granted to share information::  No  Name::     Insurance underwriterCarol  Agency::  [facility]  Relationship::  Daughter  SolicitorContact Information:     Housing/Transportation Living arrangements for the past 2 months:  [facility] Source of Information:  Adult Children Patient Interpreter Needed:  None Criminal Activity/Legal Involvement Pertinent to Current Situation/Hospitalization:  No - Comment as needed Significant Relationships:  Adult Children Lives with:  Facility Resident Do you feel safe going back to the place where you live?  Yes Need for family participation in patient care:  Yes (Comment)  Care giving concerns:  CSW received consult for discharge planning. Patient appeared disoriented. CSW spoke with patient's daughter, Marisa RegalCarol. She stated [facility] and will return there at discharge. CSW to continue to follow and assist with discharge planning needs.   Social Worker assessment / plan:  CSW spoke with patient's daughter concerning returning to memory care.  Employment status:  Retired Database administratornsurance information:  Managed Medicare PT Recommendations:  Not assessed at this time Information / Referral to community resources:     Patient/Family's Response to care:  Patient's daughter reported concerns that [facility]. Patient will transport by PTAR.  Patient/Family's Understanding of and Emotional Response to Diagnosis, Current Treatment, and Prognosis:  Patient's daughter expressed understanding of CSW role and discharge process. No questions/concerns about plan or treatment.    Emotional  Assessment Appearance:  Appears stated age Attitude/Demeanor/Rapport:  Unable to Assess Affect (typically observed):  Unable to Assess Orientation:  Oriented to Self, Oriented to Place, Oriented to Situation Alcohol / Substance use:  Not Applicable Psych involvement (Current and /or in the community):  No (Comment)  Discharge Needs  Concerns to be addressed:  No discharge needs identified Readmission within the last 30 days:  No Current discharge risk:  None Barriers to Discharge:  No Barriers Identified   [name] S Rahiem Schellinger, LCSWA 03/03/2016, 11:47 AM

## 2016-03-03 NOTE — Discharge Summary (Signed)
Physician Discharge Summary  Patient ID: Marisa Smith MRN: [id number] DOB/AGE: 1924-07-06 83 y.o.  Admit date: 02/29/2016 Discharge date:  tentative 03/03/16  Admission Diagnoses: right radius fracture Past Medical History:  Diagnosis Date  . ALLERGIC RHINITIS CAUSE UNSPECIFIED   . ANXIETY   . Autoimmune hepatitis (HCC)    on MP6, prev pred  . DEPRESSION, MAJOR, MODERATE   . Headache(784.0) 02/15/2010  . HYPERTENSION, BENIGN ESSENTIAL   . HYPOTHYROIDISM   . OSTEOPOROSIS    fx L prox hum and L distal radius, both requiring ORIF 02/2013  . Unspecified vitamin D deficiency     Discharge Diagnoses:  Active Problems:   Wrist fracture   Distal radius fracture, right   Surgeries: Procedure(s): OPEN REDUCTION INTERNAL FIXATION (ORIF) RADIUS FRACTURE on 02/29/2016 - 03/01/2016    Consultants:  physical therapy  Discharged Condition: Improved  Hospital Course: Marisa Smith is an 83 y.o. female who was admitted 02/29/2016 with a chief complaint of Chief Complaint  Patient presents with  . Fall  . Wrist Pain  , and found to have a diagnosis of right radius fracture.  They were brought to the operating room on 02/29/2016 - 03/01/2016 and underwent Procedure(s): OPEN REDUCTION INTERNAL FIXATION (ORIF) RADIUS FRACTURE.  The patient tolerated the procedure well and there were no complicating issues. Her pain was controlled overall. Patient was noted have abnormality in her preoperative chest x-ray follow-up CT scan showed no suspicious lesions or masses present. On 1/29 the patient was awake tolerating a regular breakfast without difficulties. She was pleasant. She denied any significant pain to the right upper extremity. We discussed with the patient physical therapy evaluation recommendations for a platform walker extension for when she returns to her nursing facility. She will be nonweightbearing to the hand or wrist except for light activities of daily living. She can weight-bear through the  proximal forearm and elbow of the right upper extremity for ambulation attempts when using her platform walker. On the day of her tentative discharge she was pleasant, in no acute distress and conversant. Head was atraumatic normocephalic. She had equal chest expansions present respirations were nonlabored. Abdomen was nontender. Right upper extremity revealed that her splint was clean dry and intact. She had excellent digital range of motion without signs of infection. Sensation and refill are intact to the digits. Lower extremities were nontender she was able elicit straight leg raise without difficulties, calves nontender signs of DVT were present. His therapy evaluation is currently pending. We will recommend a platform walker extension for ambulatory purposes.  They were given perioperative antibiotics: Anti-infectives    Start     Dose/Rate Route Frequency Ordered Stop   03/02/16 0930  vancomycin (VANCOCIN) IVPB 750 mg/150 ml premix     750 mg 150 mL/hr over 60 Minutes Intravenous  Once 03/01/16 1144 03/02/16 1101    .  They were given sequential compression devices, early ambulation for DVT prophylaxis.  Recent vital signs: Patient Vitals for the past 24 hrs:  BP Temp Temp src Pulse Resp SpO2  03/03/16 0603 125/71 97.9 F (36.6 C) Oral 78 18 96 %  03/02/16 1922 (!) 180/99 - - - - -  03/02/16 1919 (!) 170/96 98.2 F (36.8 C) Oral 90 - 94 %  03/02/16 1441 (!) 144/74 98.1 F (36.7 C) Oral 79 18 98 %  .  Recent laboratory studies: Ct Chest Wo Contrast  Result Date: 03/02/2016 CLINICAL DATA:  83 year old female with possible right upper lobe nodule on  recent chest radiograph EXAM: CT CHEST WITHOUT CONTRAST TECHNIQUE: Multidetector CT imaging of the chest was performed following the standard protocol without IV contrast. COMPARISON:  02/29/2016 and prior chest radiographs. FINDINGS: Cardiovascular: Cardiomegaly and coronary artery calcifications noted. Thoracic aortic atherosclerotic  calcifications noted without aneurysm. Small amount of fluid in the superior pericardial recess is identified. Mediastinum/Nodes: No enlarged mediastinal or axillary lymph nodes. Thyroid gland, trachea, and esophagus demonstrate no significant findings. Lungs/Pleura: Biapical pleural-parenchymal scarring is noted. There is no evidence of pulmonary mass, suspicious nodule, airspace disease or consolidation. Small bilateral pleural effusions and bibasilar atelectasis identified. There is no evidence of pneumothorax. Upper Abdomen: No acute abnormality. Musculoskeletal: No chest wall mass or suspicious bone lesions identified. IMPRESSION: No evidence of pulmonary mass or suspicious nodule. Small bilateral pleural effusions and mild bibasilar atelectasis. Cardiomegaly and coronary artery disease. Electronically Signed   By: Harmon Pier M.D.   On: 03/02/2016 15:01    Discharge Medications:   Allergies as of 03/03/2016      Reactions   Amlodipine Besylate    REACTION: itching   Azatadine    Per MAR?   Azathioprine    REACTION: nausea   Penicillins    REACTION: yeast infection Per MAR   Sertraline Hcl    REACTION: rash   Sulfonamide Derivatives    REACTION: swelling and itching      Medication List    TAKE these medications   acetaminophen 325 MG tablet Commonly known as:  TYLENOL Take 650 mg by mouth every 4 (four) hours as needed (fever >100).   ARTIFICIAL TEARS 1-0.3 % Soln Generic drug:  Propylene Glycol-Glycerin Place 1 drop into both eyes 3 (three) times daily.   aspirin EC 81 MG tablet Take 81 mg by mouth daily.   busPIRone 15 MG tablet Commonly known as:  BUSPAR Take 15 mg by mouth 2 (two) times daily.   Calcium Carbonate-Vitamin D 600-200 MG-UNIT Caps Take 1 capsule by mouth daily.   Glycerin-Hypromellose-PEG 400 0.2-0.36-1 % Soln Place 1 drop into both eyes 3 (three) times daily.   HYDROcodone-acetaminophen 5-325 MG tablet Commonly known as:  NORCO Take 1 tablet by  mouth every 6 (six) hours as needed for moderate pain.   levothyroxine 50 MCG tablet Commonly known as:  SYNTHROID, LEVOTHROID Take 1 tablet (50 mcg total) by mouth daily before breakfast.   LORazepam 0.5 MG tablet Commonly known as:  ATIVAN Take 0.5 mg by mouth 2 (two) times daily.   mercaptopurine 50 MG tablet Commonly known as:  PURINETHOL Take 0.5 tablets (25 mg total) by mouth daily. Give on an empty stomach 1 hour before or 2 hours after meals. Caution: Chemotherapy.   mirtazapine 30 MG tablet Commonly known as:  REMERON Take 30 mg by mouth at bedtime.   OCUSOFT LID SCRUB Pads Place 1 each into both eyes 2 (two) times daily.   ondansetron 8 MG disintegrating tablet Commonly known as:  ZOFRAN ODT Take 1 tablet (8 mg total) by mouth every 8 (eight) hours as needed for nausea or vomiting.   polyethylene glycol packet Commonly known as:  MIRALAX / GLYCOLAX Take 17 g by mouth every other day.   predniSONE 5 MG tablet Commonly known as:  DELTASONE Take 5 mg by mouth daily with breakfast.   verapamil 120 MG 24 hr capsule Commonly known as:  VERELAN PM Take 120 mg by mouth at bedtime.   Vitamin D3 1000 units Caps Take 2,000 Units by mouth daily.  Diagnostic Studies: Dg Chest 2 View  Result Date: 02/29/2016 CLINICAL DATA:  Patient fell today.  Shortness of breath. EXAM: CHEST  2 VIEW COMPARISON:  05/28/2013. FINDINGS: Lungs are hyperexpanded. Interstitial markings are diffusely coarsened with chronic features. New right upper lobe nodular opacity is evident. Cardiopericardial silhouette is at upper limits of normal for size. The visualized bony structures of the thorax are intact. Patient is status post ORIF for proximal left humerus fracture. IMPRESSION: Emphysema with underlying chronic interstitial changes. New ill-defined nodular density right upper lobe. CT chest recommended to further evaluate. Electronically Signed   By: Kennith Center M.D.   On: 02/29/2016 22:01    Dg Shoulder Right  Result Date: 02/29/2016 CLINICAL DATA:  Fall tonight with right shoulder pain. EXAM: RIGHT SHOULDER - 2+ VIEW COMPARISON:  None. FINDINGS: Mild diffuse osteopenia. No evidence of acute fracture or dislocation. Degenerative change of the spine. IMPRESSION: No acute fracture or dislocation. Electronically Signed   By: Elberta Fortis M.D.   On: 02/29/2016 19:39   Dg Wrist Complete Right  Result Date: 02/29/2016 CLINICAL DATA:  Fall with left arm/wrist injury. EXAM: RIGHT WRIST - COMPLETE 3+ VIEW COMPARISON:  None. FINDINGS: Exam demonstrates diffuse osteopenia. There are degenerative changes over the wrist and first carpometacarpal joints. There is a displaced and minimally comminuted transverse fracture of the distal radius with mild impaction. There is likely extension to the articular surface. There is dorsal angulation of the predominant distal fracture fragment. There is a minimally displaced transverse fracture of the distal ulnar metaphyseal region.There is a displaced ulnar styloid fracture. Small vessel atherosclerotic disease is present. IMPRESSION: Displaced slightly comminuted fracture of the distal radial metaphysis likely with extension to the articular surface. Minimally displaced transverse fracture of the distal ulnar metaphysis. Displaced ulnar styloid fracture. Osteopenia with degenerative changes as described. Electronically Signed   By: Elberta Fortis M.D.   On: 02/29/2016 19:38   Ct Head Wo Contrast  Result Date: 02/05/2016 CLINICAL DATA:  Headache with vomiting EXAM: CT HEAD WITHOUT CONTRAST TECHNIQUE: Contiguous axial images were obtained from the base of the skull through the vertex without intravenous contrast. COMPARISON:  February 16, 2013 FINDINGS: Brain: There is mild diffuse atrophy. There is no intracranial mass, hemorrhage, extra-axial fluid collection, or midline shift. There is patchy small vessel disease throughout the centra semiovale bilaterally. There  is no new gray-white compartment lesion. No evident acute infarct. Vascular: There is no hyperdense vessel. There is calcification in the carotid siphon regions bilaterally. There is a small focus of calcification in the distal aspect of the right vertebral artery. Skull: The bony calvarium appears intact. Sinuses/Orbits: There is mucosal thickening in several ethmoid air cells bilaterally. Other paranasal sinuses which are visualized are clear. Orbits appear symmetric bilaterally. Other: Mastoid air cells are clear. IMPRESSION: Atrophy with patchy periventricular small vessel disease. No intracranial mass, hemorrhage, or extra-axial fluid collection. No acute appearing infarct. There are foci of arterial vascular calcification. There is mild ethmoid sinus disease bilaterally. Electronically Signed   By: Bretta Bang III M.D.   On: 02/05/2016 15:47   Ct Chest Wo Contrast  Result Date: 03/02/2016 CLINICAL DATA:  83 year old female with possible right upper lobe nodule on recent chest radiograph EXAM: CT CHEST WITHOUT CONTRAST TECHNIQUE: Multidetector CT imaging of the chest was performed following the standard protocol without IV contrast. COMPARISON:  02/29/2016 and prior chest radiographs. FINDINGS: Cardiovascular: Cardiomegaly and coronary artery calcifications noted. Thoracic aortic atherosclerotic calcifications noted without aneurysm. Small amount of  fluid in the superior pericardial recess is identified. Mediastinum/Nodes: No enlarged mediastinal or axillary lymph nodes. Thyroid gland, trachea, and esophagus demonstrate no significant findings. Lungs/Pleura: Biapical pleural-parenchymal scarring is noted. There is no evidence of pulmonary mass, suspicious nodule, airspace disease or consolidation. Small bilateral pleural effusions and bibasilar atelectasis identified. There is no evidence of pneumothorax. Upper Abdomen: No acute abnormality. Musculoskeletal: No chest wall mass or suspicious bone  lesions identified. IMPRESSION: No evidence of pulmonary mass or suspicious nodule. Small bilateral pleural effusions and mild bibasilar atelectasis. Cardiomegaly and coronary artery disease. Electronically Signed   By: Harmon Pier M.D.   On: 03/02/2016 15:01    They benefited maximally from their hospital stay and there were no complications.     Disposition: 01-Home or Self Care Discharge Instructions    Call MD / Call 911    Complete by:  As directed    If you experience chest pain or shortness of breath, CALL 911 and be transported to the hospital emergency room.  If you develope a fever above 101 F, pus (white drainage) or increased drainage or redness at the wound, or calf pain, call your surgeon's office.   Constipation Prevention    Complete by:  As directed    Drink plenty of fluids.  Prune juice may be helpful.  You may use a stool softener, such as Colace (over the counter) 100 mg twice a day.  Use MiraLax (over the counter) for constipation as needed.   Diet - low sodium heart healthy    Complete by:  As directed    Discharge instructions    Complete by:  As directed    Keep bandage clean and dry.  Call for any problems.  No smoking.  Criteria for driving a car: you should be off your pain medicine for 7-8 hours, able to drive one handed(confident), thinking clearly and feeling able in your judgement to drive. Continue elevation as it will decrease swelling.  If instructed by MD move your fingers within the confines of the bandage/splint.  Use ice if instructed by your MD. Call immediately for any sudden loss of feeling in your hand/arm or change in functional abilities of the extremity.We recommend that you to take vitamin C 1000 mg a day to promote healing. We also recommend that if you require  pain medicine that you take a stool softener to prevent constipation as most pain medicines will have constipation side effects. We recommend either Peri-Colace or Senokot and recommend that  you also consider adding MiraLAX as well to prevent the constipation affects from pain medicine if you are required to use them. These medicines are over the counter and may be purchased at a local pharmacy. A cup of yogurt and a probiotic can also be helpful during the recovery process as the medicines can disrupt your intestinal environment.   Increase activity slowly as tolerated    Complete by:  As directed      Follow-up Information    Karen Chafe, MD. Schedule an appointment as soon as possible for a visit in 2 week(s).   Specialty:  Orthopedic Surgery Why:  Please follow up in 2 weeks for a wound check and cast application. Call for any questions or concerns. Contact information: [address] [place] [address] [phone]            Signed: Sheran Lawless 03/03/2016, 8:35 AM

## 2016-03-03 NOTE — NC FL2 (Signed)
[place] MEDICAID FL2 LEVEL OF CARE SCREENING TOOL     IDENTIFICATION  Patient Name: Marisa Smith Birthdate: 1924/07/21 Sex: female Admission Date (Current Location): 02/29/2016  [place] and [place] Number:  Producer, television/film/video and Address:  [facility], [address], [place], [place] [address]      Provider Number: [id number]  Attending Physician Name and Address:  Dominica Severin, MD  Relative Name and Phone Number:  Okey Regal, daughter, [phone]    Current Level of Care: Hospital Recommended Level of Care: Other (Comment), Assisted Living Facility (Memory Care) Prior Approval Number:    Date Approved/Denied:   PASRR Number:    Discharge Plan: Other (Comment) (ALF/Memory Care)    Current Diagnoses: Patient Active Problem List   Diagnosis Date Noted  . Wrist fracture 02/29/2016  . Distal radius fracture, right 02/29/2016  . Proximal left humerus fracture 02/22/2013  . Radius fracture 02/16/2013  . Bradycardia 12/06/2011  . Hyponatremia 11/24/2011  . Hyperglycemia 11/24/2011  . Autoimmune hepatitis (HCC)   . Headache(784.0) 02/15/2010  . TREMOR 02/02/2010  . ANXIETY 12/11/2009  . DEPRESSION, MAJOR, MODERATE 10/19/2009  . HYPOTHYROIDISM 04/26/2009  . Unspecified vitamin D deficiency 04/26/2009  . HYPERTENSION, BENIGN ESSENTIAL 04/26/2009  . OSTEOPOROSIS 04/26/2009    Orientation RESPIRATION BLADDER Height & Weight     Self, Place, Situation    Continent Weight:   Height:     BEHAVIORAL SYMPTOMS/MOOD NEUROLOGICAL BOWEL NUTRITION STATUS      Continent Diet (Regular)  AMBULATORY STATUS COMMUNICATION OF NEEDS Skin   Extensive Assist Verbally Surgical wounds (Closed incision on arm)                       Personal Care Assistance Level of Assistance  Bathing, Feeding, Dressing Bathing Assistance: Maximum assistance Feeding assistance: Independent Dressing Assistance: Maximum assistance     Functional Limitations Info              SPECIAL CARE FACTORS FREQUENCY                       Contractures      Additional Factors Info  Code Status, Allergies, Psychotropic Code Status Info: Full Allergies Info: Amlodipine Besylate, Azatadine, Azathioprine, Penicillins, Sertraline Hcl, Sulfonamide Derivatives Psychotropic Info: Ativan; Buspar         Current Medications (03/03/2016):   Discharge Medications: TAKE these medications   acetaminophen 325 MG tablet Commonly known as:  TYLENOL Take 650 mg by mouth every 4 (four) hours as needed (fever >100).   ARTIFICIAL TEARS 1-0.3 % Soln Generic drug:  Propylene Glycol-Glycerin Place 1 drop into both eyes 3 (three) times daily.   aspirin EC 81 MG tablet Take 81 mg by mouth daily.   busPIRone 15 MG tablet Commonly known as:  BUSPAR Take 15 mg by mouth 2 (two) times daily.   Calcium Carbonate-Vitamin D 600-200 MG-UNIT Caps Take 1 capsule by mouth daily.   Glycerin-Hypromellose-PEG 400 0.2-0.36-1 % Soln Place 1 drop into both eyes 3 (three) times daily.   HYDROcodone-acetaminophen 5-325 MG tablet Commonly known as:  NORCO Take 1 tablet by mouth every 6 (six) hours as needed for moderate pain.   levothyroxine 50 MCG tablet Commonly known as:  SYNTHROID, LEVOTHROID Take 1 tablet (50 mcg total) by mouth daily before breakfast.   LORazepam 0.5 MG tablet Commonly known as:  ATIVAN Take 0.5 mg by mouth 2 (two) times daily.   mercaptopurine 50  MG tablet Commonly known as:  PURINETHOL Take 0.5 tablets (25 mg total) by mouth daily. Give on an empty stomach 1 hour before or 2 hours after meals. Caution: Chemotherapy.   mirtazapine 30 MG tablet Commonly known as:  REMERON Take 30 mg by mouth at bedtime.   OCUSOFT LID SCRUB Pads Place 1 each into both eyes 2 (two) times daily.   ondansetron 8 MG disintegrating tablet Commonly known as:  ZOFRAN ODT Take 1 tablet (8 mg total) by mouth every 8 (eight) hours as needed for nausea or  vomiting.   polyethylene glycol packet Commonly known as:  MIRALAX / GLYCOLAX Take 17 g by mouth every other day.   predniSONE 5 MG tablet Commonly known as:  DELTASONE Take 5 mg by mouth daily with breakfast.   verapamil 120 MG 24 hr capsule Commonly known as:  VERELAN PM Take 120 mg by mouth at bedtime.   Vitamin D3 1000 units Caps Take 2,000 Units by mouth daily.     Relevant Imaging Results:  Relevant Lab Results:   Additional Information SSN: [id number]     Home Health PT   Mearl LatinNadia S Nanette Wirsing, [place]

## 2016-03-03 NOTE — Progress Notes (Signed)
Patient to be discharged. IV removed. Nurse called [facility] different times, no answer at facility. Nurse left call back number to give report. Patient is going to work with PT once more before [facility] arrives.

## 2016-03-03 NOTE — Evaluation (Signed)
Physical Therapy Evaluation Patient Details Name: Marisa Smith MRN: [id number] DOB: Oct 30, 1924 Today's Date: 03/03/2016   History of Present Illness  Pt is an 83yo [facility] presenting with R displaced wrist fracture s/p fall. Pt now R UE WB through elbow requiring platform walker.  Clinical Impression  Pt tolerated ambulation well however due to being unfamilar with R platform rolling walker, pt requiring minA for safe ambulation for walker management. Pt requires 24/7 assist due to increased falls risk and requires use of R platform walker for safe ambulation.    Follow Up Recommendations SNF;Supervision/Assistance - 24 hour    Equipment Recommendations   (R platform rolling walker)    Recommendations for Other Services       Precautions / Restrictions Precautions Precautions: Fall Precaution Comments: in memory care unit Required Braces or Orthoses: Other Brace/Splint Other Brace/Splint: R hand in splint Restrictions Weight Bearing Restrictions: Yes RUE Weight Bearing: Weight bear through elbow only      Mobility  Bed Mobility Overal bed mobility: Needs Assistance Bed Mobility: Supine to Sit     Supine to sit: Min assist     General bed mobility comments: v/c's to not use R UE, increased time, minA to scoot to EOB  Transfers Overall transfer level: Needs assistance Equipment used: Right platform walker Transfers: Sit to/from Stand Sit to Stand: Min assist         General transfer comment: assist for initial power up due to not being able to use R UE  Ambulation/Gait Ambulation/Gait assistance: Min assist Ambulation Distance (Feet): 60 Feet Assistive device: Right platform walker Gait Pattern/deviations: Step-through pattern;Decreased stride length;Narrow base of support Gait velocity: slow Gait velocity interpretation: Below normal speed for age/gender General Gait Details: pt shaky and reports "i feel unsteady", minA for walker  management as pt used to rollator  Stairs            Wheelchair Mobility    Modified Rankin (Stroke Patients Only)       Balance Overall balance assessment: Needs assistance Sitting-balance support: Feet supported;No upper extremity supported Sitting balance-Leahy Scale: Good     Standing balance support: Single extremity supported Standing balance-Leahy Scale: Poor Standing balance comment: unable to use R UE to balance so has to use L                             Pertinent Vitals/Pain Pain Assessment: No/denies pain    Home Living Family/patient expects to be discharged to:: Skilled nursing facility                 Additional Comments: [facility]. Pt does receive increased assist for bathing, dressing    Prior Function Level of Independence: Needs assistance   Gait / Transfers Assistance Needed: pt uses rollator  ADL's / Homemaking Assistance Needed: staff assists with bathing and dressing        Hand Dominance   Dominant Hand: Right    Extremity/Trunk Assessment   Upper Extremity Assessment Upper Extremity Assessment: RUE deficits/detail RUE Deficits / Details: in splint, can move fingers, good ROM at elbow and shoulder    Lower Extremity Assessment Lower Extremity Assessment: Generalized weakness    Cervical / Trunk Assessment Cervical / Trunk Assessment: Kyphotic  Communication   Communication: HOH  Cognition Arousal/Alertness: Awake/alert Behavior During Therapy: Flat affect Overall Cognitive Status: History of cognitive impairments - at baseline (dementia)  General Comments General comments (skin integrity, edema, etc.): pt assist to commode with minA, minA for hygiene and donning underwear    Exercises     Assessment/Plan    PT Assessment Patient needs continued PT services  PT Problem List Decreased strength;Decreased activity tolerance;Decreased  balance;Decreased mobility;Decreased knowledge of use of DME;Decreased safety awareness;Decreased knowledge of precautions          PT Treatment Interventions DME instruction;Gait training;Stair training;Functional mobility training;Therapeutic activities;Therapeutic exercise;Balance training    PT Goals (Current goals can be found in the Care Plan section)  Acute Rehab PT Goals Patient Stated Goal: home today PT Goal Formulation: With patient Time For Goal Achievement: 03/17/16 Potential to Achieve Goals: Good    Frequency Min 3X/week   Barriers to discharge        Co-evaluation               End of Session Equipment Utilized During Treatment: Gait belt Activity Tolerance: Patient tolerated treatment well Patient left: in chair;with call bell/phone within reach;with chair alarm set Nurse Communication: Mobility status    Functional Assessment Tool Used: clinical judgement Functional Limitation: Mobility: Walking and moving around Mobility: Walking and Moving Around Current Status [phone]): At least 40 percent but less than 60 percent impaired, limited or restricted Mobility: Walking and Moving Around Goal Status [phone]): At least 1 percent but less than 20 percent impaired, limited or restricted    Time: 1426-1451 PT Time Calculation (min) (ACUTE ONLY): 25 min   Charges:   PT Evaluation $PT Eval Moderate Complexity: 1 Procedure PT Treatments $Gait Training: 8-22 mins   PT G Codes:   PT G-Codes **NOT FOR INPATIENT CLASS** Functional Assessment Tool Used: clinical judgement Functional Limitation: Mobility: Walking and moving around Mobility: Walking and Moving Around Current Status (U9811): At least 40 percent but less than 60 percent impaired, limited or restricted Mobility: Walking and Moving Around Goal Status [phone]): At least 1 percent but less than 20 percent impaired, limited or restricted    Iona Hansen 03/03/2016, 3:15 PM   Lewis Shock, PT,  DPT Pager #: [phone] Office #: [phone]

## 2016-03-03 NOTE — Op Note (Signed)
Marisa Smith:  Setters, Marisa Smith                 ACCOUNT NO.:  [id number]  MEDICAL RECORD NO.:  [id number]  LOCATION:                                 FACILITY:  PHYSICIAN:  [name], M.D.DATE OF BIRTH:  1924/10/08  DATE OF PROCEDURE: DATE OF DISCHARGE:                              OPERATIVE REPORT   PREOPERATIVE DIAGNOSIS:  Comminuted complex intra-articular greater than 5-part distal radius fracture, right upper extremity.  POSTOPERATIVE DIAGNOSIS:  Comminuted complex intra-articular greater than 5-part distal radius fracture, right upper extremity.  PROCEDURE: 1. Open reduction and internal fixation of right distal radius     fracture with a narrow DVR plate and screw construct from [employer]     and placement of stay graft bone graft. 2. Four-view radiographic series, right wrist. 3. Distal ulna fracture closed reduction and closed treatment.  ASSISTANT:  None.  COMPLICATIONS:  None.  ANESTHESIA:  General LMA anesthetic.  TOURNIQUET TIME:  Less than an hour.  DRAINS:  One TLS drain.  INDICATIONS:  A pleasant female, who presents with the above-mentioned diagnosis.  She has a DNR status.  I have counseled she and her family in regard to all issues, do's and don'ts, risks and benefits of surgery, etc.  With this in mind, they desired to proceed.  OPERATION IN DETAIL:  She was taken to the operative theater, underwent smooth induction of anesthesia in the form of a general LMA anesthetic. She was prepped and draped with Hibiclens prescrubbed by myself, followed by a 10 minute surgical Betadine scrub, which I supervised and performed myself.  Sterile field was secured.  Arm was elevated, tourniquet was insufflated.  Time-out was observed.  Preoperative antibiotics were given in the form of vancomycin, and the patient underwent a volar radial incision.  Dissection was carried down to the FCR sheath.  This was incised dorsally and palmarly.  It was retracted. Carpal canal  contents were swept ulnarly.  Pronator incised, wrist was then reduced, stay graft placed, and following this, I then placed a narrow DVR plate and screw construct and was able to achieve adequate height, inclination, and volar tilt to my satisfaction.  This was checked under x-ray.  The patient was taken through a full range of motion.  I performed gentle closed reduction of the ulna, all looked well.  Distal radioulnar joint and radiocarpal mechanics looked excellent under live fluoro.  She was irrigated copiously.  Pronator closed with 3-0 Vicryl, followed by placement of a TLS drain, followed by closure of the skin edge with Prolene.  I should note she has very fragile skin.  I was very careful with the handling of her skin; there were no complicating features.  She was placed in a dorsal and volar clamshell type short-arm splint and was taken to recovery room in stable condition.  We will give her vancomycin and likely transition her back to her care facility in a day.  These notes have been discussed.  All questions have been encouraged and answered.     Dionne AnoWilliam M. Amanda PeaGramig, M.D.   ______________________________ Dionne AnoWilliam M. Amanda PeaGramig, M.D.    [facility]  D:  03/01/2016  T:  03/02/2016  Job:  [id number] 

## 2016-03-04 DIAGNOSIS — Z9181 History of falling: Secondary | ICD-10-CM | POA: Diagnosis not present

## 2016-03-04 DIAGNOSIS — R278 Other lack of coordination: Secondary | ICD-10-CM | POA: Diagnosis not present

## 2016-03-04 DIAGNOSIS — S62101A Fracture of unspecified carpal bone, right wrist, initial encounter for closed fracture: Secondary | ICD-10-CM | POA: Diagnosis not present

## 2016-03-04 DIAGNOSIS — S52091G Other fracture of upper end of right ulna, subsequent encounter for closed fracture with delayed healing: Secondary | ICD-10-CM | POA: Diagnosis not present

## 2016-03-04 DIAGNOSIS — M6281 Muscle weakness (generalized): Secondary | ICD-10-CM | POA: Diagnosis not present

## 2016-03-04 DIAGNOSIS — R2681 Unsteadiness on feet: Secondary | ICD-10-CM | POA: Diagnosis not present

## 2016-03-04 DIAGNOSIS — F324 Major depressive disorder, single episode, in partial remission: Secondary | ICD-10-CM | POA: Diagnosis not present

## 2016-03-04 DIAGNOSIS — K754 Autoimmune hepatitis: Secondary | ICD-10-CM | POA: Diagnosis not present

## 2016-03-06 DIAGNOSIS — R2681 Unsteadiness on feet: Secondary | ICD-10-CM | POA: Diagnosis not present

## 2016-03-06 DIAGNOSIS — M6281 Muscle weakness (generalized): Secondary | ICD-10-CM | POA: Diagnosis not present

## 2016-03-06 DIAGNOSIS — Z9181 History of falling: Secondary | ICD-10-CM | POA: Diagnosis not present

## 2016-03-06 DIAGNOSIS — S52091G Other fracture of upper end of right ulna, subsequent encounter for closed fracture with delayed healing: Secondary | ICD-10-CM | POA: Diagnosis not present

## 2016-03-06 DIAGNOSIS — R278 Other lack of coordination: Secondary | ICD-10-CM | POA: Diagnosis not present

## 2016-03-07 DIAGNOSIS — M6281 Muscle weakness (generalized): Secondary | ICD-10-CM | POA: Diagnosis not present

## 2016-03-07 DIAGNOSIS — R278 Other lack of coordination: Secondary | ICD-10-CM | POA: Diagnosis not present

## 2016-03-07 DIAGNOSIS — Z9181 History of falling: Secondary | ICD-10-CM | POA: Diagnosis not present

## 2016-03-07 DIAGNOSIS — R2681 Unsteadiness on feet: Secondary | ICD-10-CM | POA: Diagnosis not present

## 2016-03-07 DIAGNOSIS — S52091G Other fracture of upper end of right ulna, subsequent encounter for closed fracture with delayed healing: Secondary | ICD-10-CM | POA: Diagnosis not present

## 2016-03-08 DIAGNOSIS — S52091G Other fracture of upper end of right ulna, subsequent encounter for closed fracture with delayed healing: Secondary | ICD-10-CM | POA: Diagnosis not present

## 2016-03-08 DIAGNOSIS — R278 Other lack of coordination: Secondary | ICD-10-CM | POA: Diagnosis not present

## 2016-03-08 DIAGNOSIS — M6281 Muscle weakness (generalized): Secondary | ICD-10-CM | POA: Diagnosis not present

## 2016-03-08 DIAGNOSIS — Z9181 History of falling: Secondary | ICD-10-CM | POA: Diagnosis not present

## 2016-03-08 DIAGNOSIS — R2681 Unsteadiness on feet: Secondary | ICD-10-CM | POA: Diagnosis not present

## 2016-03-09 DIAGNOSIS — R2681 Unsteadiness on feet: Secondary | ICD-10-CM | POA: Diagnosis not present

## 2016-03-09 DIAGNOSIS — S52091G Other fracture of upper end of right ulna, subsequent encounter for closed fracture with delayed healing: Secondary | ICD-10-CM | POA: Diagnosis not present

## 2016-03-09 DIAGNOSIS — R278 Other lack of coordination: Secondary | ICD-10-CM | POA: Diagnosis not present

## 2016-03-09 DIAGNOSIS — M6281 Muscle weakness (generalized): Secondary | ICD-10-CM | POA: Diagnosis not present

## 2016-03-09 DIAGNOSIS — Z9181 History of falling: Secondary | ICD-10-CM | POA: Diagnosis not present

## 2016-03-10 DIAGNOSIS — M6281 Muscle weakness (generalized): Secondary | ICD-10-CM | POA: Diagnosis not present

## 2016-03-10 DIAGNOSIS — R278 Other lack of coordination: Secondary | ICD-10-CM | POA: Diagnosis not present

## 2016-03-10 DIAGNOSIS — S52091G Other fracture of upper end of right ulna, subsequent encounter for closed fracture with delayed healing: Secondary | ICD-10-CM | POA: Diagnosis not present

## 2016-03-10 DIAGNOSIS — Z9181 History of falling: Secondary | ICD-10-CM | POA: Diagnosis not present

## 2016-03-10 DIAGNOSIS — R2681 Unsteadiness on feet: Secondary | ICD-10-CM | POA: Diagnosis not present

## 2016-03-11 DIAGNOSIS — S52091G Other fracture of upper end of right ulna, subsequent encounter for closed fracture with delayed healing: Secondary | ICD-10-CM | POA: Diagnosis not present

## 2016-03-11 DIAGNOSIS — M6281 Muscle weakness (generalized): Secondary | ICD-10-CM | POA: Diagnosis not present

## 2016-03-11 DIAGNOSIS — R2681 Unsteadiness on feet: Secondary | ICD-10-CM | POA: Diagnosis not present

## 2016-03-11 DIAGNOSIS — R278 Other lack of coordination: Secondary | ICD-10-CM | POA: Diagnosis not present

## 2016-03-11 DIAGNOSIS — Z9181 History of falling: Secondary | ICD-10-CM | POA: Diagnosis not present

## 2016-03-12 DIAGNOSIS — S52091G Other fracture of upper end of right ulna, subsequent encounter for closed fracture with delayed healing: Secondary | ICD-10-CM | POA: Diagnosis not present

## 2016-03-12 DIAGNOSIS — Z9181 History of falling: Secondary | ICD-10-CM | POA: Diagnosis not present

## 2016-03-12 DIAGNOSIS — M6281 Muscle weakness (generalized): Secondary | ICD-10-CM | POA: Diagnosis not present

## 2016-03-12 DIAGNOSIS — R278 Other lack of coordination: Secondary | ICD-10-CM | POA: Diagnosis not present

## 2016-03-12 DIAGNOSIS — R2681 Unsteadiness on feet: Secondary | ICD-10-CM | POA: Diagnosis not present

## 2016-03-13 DIAGNOSIS — Z9181 History of falling: Secondary | ICD-10-CM | POA: Diagnosis not present

## 2016-03-13 DIAGNOSIS — S52091G Other fracture of upper end of right ulna, subsequent encounter for closed fracture with delayed healing: Secondary | ICD-10-CM | POA: Diagnosis not present

## 2016-03-13 DIAGNOSIS — R278 Other lack of coordination: Secondary | ICD-10-CM | POA: Diagnosis not present

## 2016-03-13 DIAGNOSIS — M6281 Muscle weakness (generalized): Secondary | ICD-10-CM | POA: Diagnosis not present

## 2016-03-13 DIAGNOSIS — S52571D Other intraarticular fracture of lower end of right radius, subsequent encounter for closed fracture with routine healing: Secondary | ICD-10-CM | POA: Diagnosis not present

## 2016-03-13 DIAGNOSIS — R2681 Unsteadiness on feet: Secondary | ICD-10-CM | POA: Diagnosis not present

## 2016-03-14 DIAGNOSIS — R2681 Unsteadiness on feet: Secondary | ICD-10-CM | POA: Diagnosis not present

## 2016-03-14 DIAGNOSIS — R278 Other lack of coordination: Secondary | ICD-10-CM | POA: Diagnosis not present

## 2016-03-14 DIAGNOSIS — Z9181 History of falling: Secondary | ICD-10-CM | POA: Diagnosis not present

## 2016-03-14 DIAGNOSIS — M6281 Muscle weakness (generalized): Secondary | ICD-10-CM | POA: Diagnosis not present

## 2016-03-14 DIAGNOSIS — S52091G Other fracture of upper end of right ulna, subsequent encounter for closed fracture with delayed healing: Secondary | ICD-10-CM | POA: Diagnosis not present

## 2016-03-15 DIAGNOSIS — R2681 Unsteadiness on feet: Secondary | ICD-10-CM | POA: Diagnosis not present

## 2016-03-15 DIAGNOSIS — Z9181 History of falling: Secondary | ICD-10-CM | POA: Diagnosis not present

## 2016-03-15 DIAGNOSIS — M6281 Muscle weakness (generalized): Secondary | ICD-10-CM | POA: Diagnosis not present

## 2016-03-15 DIAGNOSIS — S52091G Other fracture of upper end of right ulna, subsequent encounter for closed fracture with delayed healing: Secondary | ICD-10-CM | POA: Diagnosis not present

## 2016-03-15 DIAGNOSIS — R278 Other lack of coordination: Secondary | ICD-10-CM | POA: Diagnosis not present

## 2016-03-16 DIAGNOSIS — S52091G Other fracture of upper end of right ulna, subsequent encounter for closed fracture with delayed healing: Secondary | ICD-10-CM | POA: Diagnosis not present

## 2016-03-16 DIAGNOSIS — Z9181 History of falling: Secondary | ICD-10-CM | POA: Diagnosis not present

## 2016-03-16 DIAGNOSIS — R2681 Unsteadiness on feet: Secondary | ICD-10-CM | POA: Diagnosis not present

## 2016-03-16 DIAGNOSIS — R278 Other lack of coordination: Secondary | ICD-10-CM | POA: Diagnosis not present

## 2016-03-16 DIAGNOSIS — M6281 Muscle weakness (generalized): Secondary | ICD-10-CM | POA: Diagnosis not present

## 2016-03-17 DIAGNOSIS — R2681 Unsteadiness on feet: Secondary | ICD-10-CM | POA: Diagnosis not present

## 2016-03-17 DIAGNOSIS — S52091G Other fracture of upper end of right ulna, subsequent encounter for closed fracture with delayed healing: Secondary | ICD-10-CM | POA: Diagnosis not present

## 2016-03-17 DIAGNOSIS — Z9181 History of falling: Secondary | ICD-10-CM | POA: Diagnosis not present

## 2016-03-17 DIAGNOSIS — M6281 Muscle weakness (generalized): Secondary | ICD-10-CM | POA: Diagnosis not present

## 2016-03-17 DIAGNOSIS — R278 Other lack of coordination: Secondary | ICD-10-CM | POA: Diagnosis not present

## 2016-03-18 DIAGNOSIS — S52091G Other fracture of upper end of right ulna, subsequent encounter for closed fracture with delayed healing: Secondary | ICD-10-CM | POA: Diagnosis not present

## 2016-03-18 DIAGNOSIS — Z9181 History of falling: Secondary | ICD-10-CM | POA: Diagnosis not present

## 2016-03-18 DIAGNOSIS — R278 Other lack of coordination: Secondary | ICD-10-CM | POA: Diagnosis not present

## 2016-03-18 DIAGNOSIS — Z79899 Other long term (current) drug therapy: Secondary | ICD-10-CM | POA: Diagnosis not present

## 2016-03-18 DIAGNOSIS — R2681 Unsteadiness on feet: Secondary | ICD-10-CM | POA: Diagnosis not present

## 2016-03-18 DIAGNOSIS — M6281 Muscle weakness (generalized): Secondary | ICD-10-CM | POA: Diagnosis not present

## 2016-03-18 DIAGNOSIS — I10 Essential (primary) hypertension: Secondary | ICD-10-CM | POA: Diagnosis not present

## 2016-03-19 DIAGNOSIS — S52091G Other fracture of upper end of right ulna, subsequent encounter for closed fracture with delayed healing: Secondary | ICD-10-CM | POA: Diagnosis not present

## 2016-03-19 DIAGNOSIS — R278 Other lack of coordination: Secondary | ICD-10-CM | POA: Diagnosis not present

## 2016-03-19 DIAGNOSIS — Z9181 History of falling: Secondary | ICD-10-CM | POA: Diagnosis not present

## 2016-03-19 DIAGNOSIS — M6281 Muscle weakness (generalized): Secondary | ICD-10-CM | POA: Diagnosis not present

## 2016-03-19 DIAGNOSIS — R2681 Unsteadiness on feet: Secondary | ICD-10-CM | POA: Diagnosis not present

## 2016-03-20 DIAGNOSIS — S52091G Other fracture of upper end of right ulna, subsequent encounter for closed fracture with delayed healing: Secondary | ICD-10-CM | POA: Diagnosis not present

## 2016-03-20 DIAGNOSIS — M6281 Muscle weakness (generalized): Secondary | ICD-10-CM | POA: Diagnosis not present

## 2016-03-20 DIAGNOSIS — R2681 Unsteadiness on feet: Secondary | ICD-10-CM | POA: Diagnosis not present

## 2016-03-20 DIAGNOSIS — R278 Other lack of coordination: Secondary | ICD-10-CM | POA: Diagnosis not present

## 2016-03-20 DIAGNOSIS — Z9181 History of falling: Secondary | ICD-10-CM | POA: Diagnosis not present

## 2016-03-21 DIAGNOSIS — S52091G Other fracture of upper end of right ulna, subsequent encounter for closed fracture with delayed healing: Secondary | ICD-10-CM | POA: Diagnosis not present

## 2016-03-21 DIAGNOSIS — Z9181 History of falling: Secondary | ICD-10-CM | POA: Diagnosis not present

## 2016-03-21 DIAGNOSIS — M6281 Muscle weakness (generalized): Secondary | ICD-10-CM | POA: Diagnosis not present

## 2016-03-21 DIAGNOSIS — R2681 Unsteadiness on feet: Secondary | ICD-10-CM | POA: Diagnosis not present

## 2016-03-21 DIAGNOSIS — R278 Other lack of coordination: Secondary | ICD-10-CM | POA: Diagnosis not present

## 2016-03-22 DIAGNOSIS — R278 Other lack of coordination: Secondary | ICD-10-CM | POA: Diagnosis not present

## 2016-03-22 DIAGNOSIS — S52091G Other fracture of upper end of right ulna, subsequent encounter for closed fracture with delayed healing: Secondary | ICD-10-CM | POA: Diagnosis not present

## 2016-03-22 DIAGNOSIS — Z9181 History of falling: Secondary | ICD-10-CM | POA: Diagnosis not present

## 2016-03-22 DIAGNOSIS — R2681 Unsteadiness on feet: Secondary | ICD-10-CM | POA: Diagnosis not present

## 2016-03-22 DIAGNOSIS — M6281 Muscle weakness (generalized): Secondary | ICD-10-CM | POA: Diagnosis not present

## 2016-03-24 DIAGNOSIS — R2681 Unsteadiness on feet: Secondary | ICD-10-CM | POA: Diagnosis not present

## 2016-03-24 DIAGNOSIS — M6281 Muscle weakness (generalized): Secondary | ICD-10-CM | POA: Diagnosis not present

## 2016-03-24 DIAGNOSIS — R278 Other lack of coordination: Secondary | ICD-10-CM | POA: Diagnosis not present

## 2016-03-24 DIAGNOSIS — Z9181 History of falling: Secondary | ICD-10-CM | POA: Diagnosis not present

## 2016-03-24 DIAGNOSIS — S52091G Other fracture of upper end of right ulna, subsequent encounter for closed fracture with delayed healing: Secondary | ICD-10-CM | POA: Diagnosis not present

## 2016-03-25 DIAGNOSIS — R278 Other lack of coordination: Secondary | ICD-10-CM | POA: Diagnosis not present

## 2016-03-25 DIAGNOSIS — R2681 Unsteadiness on feet: Secondary | ICD-10-CM | POA: Diagnosis not present

## 2016-03-25 DIAGNOSIS — S52091G Other fracture of upper end of right ulna, subsequent encounter for closed fracture with delayed healing: Secondary | ICD-10-CM | POA: Diagnosis not present

## 2016-03-25 DIAGNOSIS — M6281 Muscle weakness (generalized): Secondary | ICD-10-CM | POA: Diagnosis not present

## 2016-03-25 DIAGNOSIS — Z9181 History of falling: Secondary | ICD-10-CM | POA: Diagnosis not present

## 2016-03-26 DIAGNOSIS — R2681 Unsteadiness on feet: Secondary | ICD-10-CM | POA: Diagnosis not present

## 2016-03-26 DIAGNOSIS — M6281 Muscle weakness (generalized): Secondary | ICD-10-CM | POA: Diagnosis not present

## 2016-03-26 DIAGNOSIS — R278 Other lack of coordination: Secondary | ICD-10-CM | POA: Diagnosis not present

## 2016-03-26 DIAGNOSIS — Z9181 History of falling: Secondary | ICD-10-CM | POA: Diagnosis not present

## 2016-03-26 DIAGNOSIS — S52091G Other fracture of upper end of right ulna, subsequent encounter for closed fracture with delayed healing: Secondary | ICD-10-CM | POA: Diagnosis not present

## 2016-03-27 DIAGNOSIS — R2681 Unsteadiness on feet: Secondary | ICD-10-CM | POA: Diagnosis not present

## 2016-03-27 DIAGNOSIS — Z9181 History of falling: Secondary | ICD-10-CM | POA: Diagnosis not present

## 2016-03-27 DIAGNOSIS — M6281 Muscle weakness (generalized): Secondary | ICD-10-CM | POA: Diagnosis not present

## 2016-03-27 DIAGNOSIS — S52091G Other fracture of upper end of right ulna, subsequent encounter for closed fracture with delayed healing: Secondary | ICD-10-CM | POA: Diagnosis not present

## 2016-03-27 DIAGNOSIS — R278 Other lack of coordination: Secondary | ICD-10-CM | POA: Diagnosis not present

## 2016-03-28 DIAGNOSIS — R278 Other lack of coordination: Secondary | ICD-10-CM | POA: Diagnosis not present

## 2016-03-28 DIAGNOSIS — S52571D Other intraarticular fracture of lower end of right radius, subsequent encounter for closed fracture with routine healing: Secondary | ICD-10-CM | POA: Diagnosis not present

## 2016-03-28 DIAGNOSIS — S52091G Other fracture of upper end of right ulna, subsequent encounter for closed fracture with delayed healing: Secondary | ICD-10-CM | POA: Diagnosis not present

## 2016-03-28 DIAGNOSIS — Z4789 Encounter for other orthopedic aftercare: Secondary | ICD-10-CM | POA: Diagnosis not present

## 2016-03-28 DIAGNOSIS — R2681 Unsteadiness on feet: Secondary | ICD-10-CM | POA: Diagnosis not present

## 2016-03-28 DIAGNOSIS — M6281 Muscle weakness (generalized): Secondary | ICD-10-CM | POA: Diagnosis not present

## 2016-03-28 DIAGNOSIS — Z9181 History of falling: Secondary | ICD-10-CM | POA: Diagnosis not present

## 2016-03-29 DIAGNOSIS — R2681 Unsteadiness on feet: Secondary | ICD-10-CM | POA: Diagnosis not present

## 2016-03-29 DIAGNOSIS — Z9181 History of falling: Secondary | ICD-10-CM | POA: Diagnosis not present

## 2016-03-29 DIAGNOSIS — R278 Other lack of coordination: Secondary | ICD-10-CM | POA: Diagnosis not present

## 2016-03-29 DIAGNOSIS — M6281 Muscle weakness (generalized): Secondary | ICD-10-CM | POA: Diagnosis not present

## 2016-03-29 DIAGNOSIS — S52091G Other fracture of upper end of right ulna, subsequent encounter for closed fracture with delayed healing: Secondary | ICD-10-CM | POA: Diagnosis not present

## 2016-03-30 DIAGNOSIS — S52091G Other fracture of upper end of right ulna, subsequent encounter for closed fracture with delayed healing: Secondary | ICD-10-CM | POA: Diagnosis not present

## 2016-03-30 DIAGNOSIS — Z9181 History of falling: Secondary | ICD-10-CM | POA: Diagnosis not present

## 2016-03-30 DIAGNOSIS — M6281 Muscle weakness (generalized): Secondary | ICD-10-CM | POA: Diagnosis not present

## 2016-03-30 DIAGNOSIS — R278 Other lack of coordination: Secondary | ICD-10-CM | POA: Diagnosis not present

## 2016-03-30 DIAGNOSIS — R2681 Unsteadiness on feet: Secondary | ICD-10-CM | POA: Diagnosis not present

## 2016-03-31 DIAGNOSIS — S52091G Other fracture of upper end of right ulna, subsequent encounter for closed fracture with delayed healing: Secondary | ICD-10-CM | POA: Diagnosis not present

## 2016-03-31 DIAGNOSIS — M6281 Muscle weakness (generalized): Secondary | ICD-10-CM | POA: Diagnosis not present

## 2016-03-31 DIAGNOSIS — Z9181 History of falling: Secondary | ICD-10-CM | POA: Diagnosis not present

## 2016-03-31 DIAGNOSIS — R278 Other lack of coordination: Secondary | ICD-10-CM | POA: Diagnosis not present

## 2016-03-31 DIAGNOSIS — R2681 Unsteadiness on feet: Secondary | ICD-10-CM | POA: Diagnosis not present

## 2016-04-01 DIAGNOSIS — Z9181 History of falling: Secondary | ICD-10-CM | POA: Diagnosis not present

## 2016-04-01 DIAGNOSIS — R2681 Unsteadiness on feet: Secondary | ICD-10-CM | POA: Diagnosis not present

## 2016-04-01 DIAGNOSIS — S52091G Other fracture of upper end of right ulna, subsequent encounter for closed fracture with delayed healing: Secondary | ICD-10-CM | POA: Diagnosis not present

## 2016-04-01 DIAGNOSIS — M6281 Muscle weakness (generalized): Secondary | ICD-10-CM | POA: Diagnosis not present

## 2016-04-01 DIAGNOSIS — R278 Other lack of coordination: Secondary | ICD-10-CM | POA: Diagnosis not present

## 2016-04-02 DIAGNOSIS — S52091G Other fracture of upper end of right ulna, subsequent encounter for closed fracture with delayed healing: Secondary | ICD-10-CM | POA: Diagnosis not present

## 2016-04-02 DIAGNOSIS — Z9181 History of falling: Secondary | ICD-10-CM | POA: Diagnosis not present

## 2016-04-02 DIAGNOSIS — R278 Other lack of coordination: Secondary | ICD-10-CM | POA: Diagnosis not present

## 2016-04-02 DIAGNOSIS — M6281 Muscle weakness (generalized): Secondary | ICD-10-CM | POA: Diagnosis not present

## 2016-04-02 DIAGNOSIS — R2681 Unsteadiness on feet: Secondary | ICD-10-CM | POA: Diagnosis not present

## 2016-04-07 DIAGNOSIS — K759 Inflammatory liver disease, unspecified: Secondary | ICD-10-CM | POA: Diagnosis not present

## 2016-04-07 DIAGNOSIS — I10 Essential (primary) hypertension: Secondary | ICD-10-CM | POA: Diagnosis not present

## 2016-04-07 DIAGNOSIS — S52124D Nondisplaced fracture of head of right radius, subsequent encounter for closed fracture with routine healing: Secondary | ICD-10-CM | POA: Diagnosis not present

## 2016-04-11 DIAGNOSIS — E039 Hypothyroidism, unspecified: Secondary | ICD-10-CM | POA: Diagnosis not present

## 2016-04-18 DIAGNOSIS — Z4789 Encounter for other orthopedic aftercare: Secondary | ICD-10-CM | POA: Diagnosis not present

## 2016-04-18 DIAGNOSIS — S52571D Other intraarticular fracture of lower end of right radius, subsequent encounter for closed fracture with routine healing: Secondary | ICD-10-CM | POA: Diagnosis not present

## 2016-05-07 DIAGNOSIS — M7061 Trochanteric bursitis, right hip: Secondary | ICD-10-CM | POA: Diagnosis not present

## 2016-05-16 DIAGNOSIS — S52571D Other intraarticular fracture of lower end of right radius, subsequent encounter for closed fracture with routine healing: Secondary | ICD-10-CM | POA: Diagnosis not present

## 2016-05-16 DIAGNOSIS — Z4789 Encounter for other orthopedic aftercare: Secondary | ICD-10-CM | POA: Diagnosis not present

## 2016-05-16 DIAGNOSIS — M7061 Trochanteric bursitis, right hip: Secondary | ICD-10-CM | POA: Diagnosis not present

## 2016-06-04 DIAGNOSIS — E039 Hypothyroidism, unspecified: Secondary | ICD-10-CM | POA: Diagnosis not present

## 2016-06-04 DIAGNOSIS — M7061 Trochanteric bursitis, right hip: Secondary | ICD-10-CM | POA: Diagnosis not present

## 2016-06-04 DIAGNOSIS — H40023 Open angle with borderline findings, high risk, bilateral: Secondary | ICD-10-CM | POA: Diagnosis not present

## 2016-06-04 DIAGNOSIS — K754 Autoimmune hepatitis: Secondary | ICD-10-CM | POA: Diagnosis not present

## 2016-06-09 DIAGNOSIS — H40021 Open angle with borderline findings, high risk, right eye: Secondary | ICD-10-CM | POA: Diagnosis not present

## 2016-06-18 DIAGNOSIS — M7061 Trochanteric bursitis, right hip: Secondary | ICD-10-CM | POA: Diagnosis not present

## 2016-06-18 DIAGNOSIS — S62101D Fracture of unspecified carpal bone, right wrist, subsequent encounter for fracture with routine healing: Secondary | ICD-10-CM | POA: Diagnosis not present

## 2016-06-23 DIAGNOSIS — H40022 Open angle with borderline findings, high risk, left eye: Secondary | ICD-10-CM | POA: Diagnosis not present

## 2016-08-25 DIAGNOSIS — H6123 Impacted cerumen, bilateral: Secondary | ICD-10-CM | POA: Diagnosis not present

## 2016-09-05 DIAGNOSIS — H6121 Impacted cerumen, right ear: Secondary | ICD-10-CM | POA: Diagnosis not present

## 2016-09-23 DIAGNOSIS — M7989 Other specified soft tissue disorders: Secondary | ICD-10-CM | POA: Diagnosis not present

## 2016-09-26 DIAGNOSIS — Z9181 History of falling: Secondary | ICD-10-CM | POA: Diagnosis not present

## 2016-09-26 DIAGNOSIS — M6281 Muscle weakness (generalized): Secondary | ICD-10-CM | POA: Diagnosis not present

## 2016-09-26 DIAGNOSIS — R079 Chest pain, unspecified: Secondary | ICD-10-CM | POA: Diagnosis not present

## 2016-09-29 DIAGNOSIS — M6281 Muscle weakness (generalized): Secondary | ICD-10-CM | POA: Diagnosis not present

## 2016-09-29 DIAGNOSIS — Z9181 History of falling: Secondary | ICD-10-CM | POA: Diagnosis not present

## 2016-09-30 DIAGNOSIS — Z9181 History of falling: Secondary | ICD-10-CM | POA: Diagnosis not present

## 2016-09-30 DIAGNOSIS — M6281 Muscle weakness (generalized): Secondary | ICD-10-CM | POA: Diagnosis not present

## 2016-10-01 DIAGNOSIS — Z9181 History of falling: Secondary | ICD-10-CM | POA: Diagnosis not present

## 2016-10-02 DIAGNOSIS — Z9181 History of falling: Secondary | ICD-10-CM | POA: Diagnosis not present

## 2016-10-03 DIAGNOSIS — Z9181 History of falling: Secondary | ICD-10-CM | POA: Diagnosis not present

## 2016-10-06 DIAGNOSIS — M6281 Muscle weakness (generalized): Secondary | ICD-10-CM | POA: Diagnosis not present

## 2016-10-06 DIAGNOSIS — Z9181 History of falling: Secondary | ICD-10-CM | POA: Diagnosis not present

## 2016-10-07 DIAGNOSIS — Z9181 History of falling: Secondary | ICD-10-CM | POA: Diagnosis not present

## 2016-10-08 DIAGNOSIS — Z9181 History of falling: Secondary | ICD-10-CM | POA: Diagnosis not present

## 2016-10-08 DIAGNOSIS — M6281 Muscle weakness (generalized): Secondary | ICD-10-CM | POA: Diagnosis not present

## 2016-10-09 DIAGNOSIS — Z9181 History of falling: Secondary | ICD-10-CM | POA: Diagnosis not present

## 2016-10-09 DIAGNOSIS — M6281 Muscle weakness (generalized): Secondary | ICD-10-CM | POA: Diagnosis not present

## 2016-10-10 DIAGNOSIS — Z9181 History of falling: Secondary | ICD-10-CM | POA: Diagnosis not present

## 2016-10-10 DIAGNOSIS — M6281 Muscle weakness (generalized): Secondary | ICD-10-CM | POA: Diagnosis not present

## 2016-10-13 DIAGNOSIS — Z9181 History of falling: Secondary | ICD-10-CM | POA: Diagnosis not present

## 2016-10-14 DIAGNOSIS — M6281 Muscle weakness (generalized): Secondary | ICD-10-CM | POA: Diagnosis not present

## 2016-10-14 DIAGNOSIS — Z9181 History of falling: Secondary | ICD-10-CM | POA: Diagnosis not present

## 2016-10-15 ENCOUNTER — Ambulatory Visit (INDEPENDENT_AMBULATORY_CARE_PROVIDER_SITE_OTHER): Payer: Medicare Other | Admitting: Podiatry

## 2016-10-15 ENCOUNTER — Encounter: Payer: Self-pay | Admitting: Podiatry

## 2016-10-15 DIAGNOSIS — M79609 Pain in unspecified limb: Secondary | ICD-10-CM | POA: Diagnosis not present

## 2016-10-15 DIAGNOSIS — B351 Tinea unguium: Secondary | ICD-10-CM | POA: Diagnosis not present

## 2016-10-15 DIAGNOSIS — M6281 Muscle weakness (generalized): Secondary | ICD-10-CM | POA: Diagnosis not present

## 2016-10-15 DIAGNOSIS — Z9181 History of falling: Secondary | ICD-10-CM | POA: Diagnosis not present

## 2016-10-16 DIAGNOSIS — M6281 Muscle weakness (generalized): Secondary | ICD-10-CM | POA: Diagnosis not present

## 2016-10-16 DIAGNOSIS — Z9181 History of falling: Secondary | ICD-10-CM | POA: Diagnosis not present

## 2016-10-17 DIAGNOSIS — Z9181 History of falling: Secondary | ICD-10-CM | POA: Diagnosis not present

## 2016-10-20 DIAGNOSIS — M6281 Muscle weakness (generalized): Secondary | ICD-10-CM | POA: Diagnosis not present

## 2016-10-20 DIAGNOSIS — Z9181 History of falling: Secondary | ICD-10-CM | POA: Diagnosis not present

## 2016-10-20 NOTE — Progress Notes (Signed)
Subjective:    Patient ID: Marisa Smith, female    DOB: 1924-03-18, 83 y.o.   MRN: [id number]  HPI 83 y.o. female presents with the above complaint. Reports painful thickened nails that interfere with ability to ambulate. Denies other issues.   Past Medical History:  Diagnosis Date  . ALLERGIC RHINITIS CAUSE UNSPECIFIED   . ANXIETY   . Autoimmune hepatitis (HCC)    on MP6, prev pred  . DEPRESSION, MAJOR, MODERATE   . Headache(784.0) 02/15/2010  . HYPERTENSION, BENIGN ESSENTIAL   . HYPOTHYROIDISM   . OSTEOPOROSIS    fx L prox hum and L distal radius, both requiring ORIF 02/2013  . Unspecified vitamin D deficiency    Past Surgical History:  Procedure Laterality Date  . CATARACT EXTRACTION  2008   Left eye  . CATARACT EXTRACTION  2009   Right eye  . HUMERUS IM NAIL Left 02/2013   DR Sol Blazing  . HUMERUS IM NAIL Left 02/24/2013   Procedure: INTRAMEDULLARY (IM) NAIL LEFT PROXIMAL  HUMERUS;  Surgeon: Mable Paris, MD;  Location: [facility] OR;  Service: Orthopedics;  Laterality: Left;  . OPEN REDUCTION INTERNAL FIXATION (ORIF) DISTAL RADIAL FRACTURE Left 02/16/2013   Procedure: OPEN REDUCTION INTERNAL FIXATION (ORIF) DISTAL RADIAL FRACTURE;  Surgeon: Sharma Covert, MD;  Location: [facility] OR;  Service: Orthopedics;  Laterality: Left;  . ORIF RADIAL FRACTURE Right 03/01/2016   Procedure: OPEN REDUCTION INTERNAL FIXATION (ORIF) RADIUS FRACTURE;  Surgeon: Dominica Severin, MD;  Location: [facility] OR;  Service: Orthopedics;  Laterality: Right;  . TONSILLECTOMY  1945    Current Outpatient Prescriptions:  .  acetaminophen (TYLENOL) 325 MG tablet, Take 650 mg by mouth every 4 (four) hours as needed (fever >100)., Disp: , Rfl:  .  aspirin EC 81 MG EC tablet, Take 81 mg by mouth daily. , Disp: , Rfl:  .  busPIRone (BUSPAR) 15 MG tablet, Take 15 mg by mouth 2 (two) times daily. , Disp: , Rfl: 10 .  Calcium Carbonate-Vitamin D 600-200 MG-UNIT CAPS, Take 1 capsule by mouth daily., Disp: , Rfl:  .   Cholecalciferol (VITAMIN D3) 1000 units CAPS, Take 2,000 Units by mouth daily., Disp: , Rfl:  .  Eyelid Cleansers (OCUSOFT LID SCRUB) PADS, Place 1 each into both eyes 2 (two) times daily., Disp: , Rfl:  .  Glycerin-Hypromellose-PEG 400 0.2-0.36-1 % SOLN, Place 1 drop into both eyes 3 (three) times daily., Disp: , Rfl:  .  HYDROcodone-acetaminophen (NORCO) 5-325 MG tablet, Take 1 tablet by mouth every 6 (six) hours as needed for moderate pain., Disp: 30 tablet, Rfl: 0 .  levothyroxine (SYNTHROID, LEVOTHROID) 50 MCG tablet, Take 1 tablet (50 mcg total) by mouth daily before breakfast., Disp: 30 tablet, Rfl: 5 .  LORazepam (ATIVAN) 0.5 MG tablet, Take 0.5 mg by mouth 2 (two) times daily. , Disp: , Rfl: 4 .  mercaptopurine (PURINETHOL) 50 MG tablet, Take 0.5 tablets (25 mg total) by mouth daily. Give on an empty stomach 1 hour before or 2 hours after meals. Caution: Chemotherapy., Disp: 30 tablet, Rfl: 11 .  mirtazapine (REMERON) 30 MG tablet, Take 30 mg by mouth at bedtime. , Disp: , Rfl: 10 .  ondansetron (ZOFRAN ODT) 8 MG disintegrating tablet, Take 1 tablet (8 mg total) by mouth every 8 (eight) hours as needed for nausea or vomiting. (Patient not taking: Reported on 02/29/2016), Disp: 10 tablet, Rfl: 0 .  polyethylene glycol (MIRALAX / GLYCOLAX) packet, Take 17 g by mouth every other  day., Disp: , Rfl:  .  predniSONE (DELTASONE) 5 MG tablet, Take 5 mg by mouth daily with breakfast., Disp: , Rfl:  .  Propylene Glycol-Glycerin (ARTIFICIAL TEARS) 1-0.3 % SOLN, Place 1 drop into both eyes 3 (three) times daily., Disp: , Rfl:  .  verapamil (VERELAN PM) 120 MG 24 hr capsule, Take 120 mg by mouth at bedtime., Disp: , Rfl:   Allergies  Allergen Reactions  . Amlodipine Besylate     REACTION: itching  . Azatadine     Per MAR?  [name] Kitchen Azathioprine     REACTION: nausea  . Penicillins     REACTION: yeast infection Per MAR  . Sertraline Hcl     REACTION: rash  . Sulfonamide Derivatives     REACTION:  swelling and itching    Review of Systems     Objective:   Physical Exam There were no vitals filed for this visit. General AA&O x3. Normal mood and affect.  Vascular Dorsalis pedis and posterior tibial pulses  present 2+ bilaterally  Capillary refill normal to all digits. Pedal hair growth diminished.  Neurologic Epicritic sensation grossly present.  Dermatologic No open lesions. Interspaces clear of maceration. Nails elongated and thickened with yellow discoloration, pain to palpation.  Orthopedic: MMT 5/5 in dorsiflexion, plantarflexion, inversion, and eversion. Normal joint ROM without pain or crepitus.     Assessment & Plan:  Onychomycosis with Pain -Nails debrided as below.  Procedure: Nail Debridement Rationale: pain Type of Debridement: manual, sharp debridement. Instrumentation: Nail nipper, rotary burr. Number of Nails: 10

## 2016-11-10 DIAGNOSIS — E039 Hypothyroidism, unspecified: Secondary | ICD-10-CM | POA: Diagnosis not present

## 2016-11-10 DIAGNOSIS — I10 Essential (primary) hypertension: Secondary | ICD-10-CM | POA: Diagnosis not present

## 2016-11-10 DIAGNOSIS — K754 Autoimmune hepatitis: Secondary | ICD-10-CM | POA: Diagnosis not present

## 2016-11-10 DIAGNOSIS — H04123 Dry eye syndrome of bilateral lacrimal glands: Secondary | ICD-10-CM | POA: Diagnosis not present

## 2016-11-11 DIAGNOSIS — E039 Hypothyroidism, unspecified: Secondary | ICD-10-CM | POA: Diagnosis not present

## 2016-11-11 DIAGNOSIS — Z79899 Other long term (current) drug therapy: Secondary | ICD-10-CM | POA: Diagnosis not present

## 2016-11-11 DIAGNOSIS — D649 Anemia, unspecified: Secondary | ICD-10-CM | POA: Diagnosis not present

## 2016-11-20 DIAGNOSIS — D3132 Benign neoplasm of left choroid: Secondary | ICD-10-CM | POA: Diagnosis not present

## 2016-11-20 DIAGNOSIS — H35033 Hypertensive retinopathy, bilateral: Secondary | ICD-10-CM | POA: Diagnosis not present

## 2016-11-20 DIAGNOSIS — H4720 Unspecified optic atrophy: Secondary | ICD-10-CM | POA: Diagnosis not present

## 2016-11-20 DIAGNOSIS — H40023 Open angle with borderline findings, high risk, bilateral: Secondary | ICD-10-CM | POA: Diagnosis not present

## 2016-11-21 DIAGNOSIS — M6281 Muscle weakness (generalized): Secondary | ICD-10-CM | POA: Diagnosis not present

## 2016-11-21 DIAGNOSIS — Z7982 Long term (current) use of aspirin: Secondary | ICD-10-CM | POA: Diagnosis not present

## 2016-11-21 DIAGNOSIS — I10 Essential (primary) hypertension: Secondary | ICD-10-CM | POA: Diagnosis not present

## 2016-11-21 DIAGNOSIS — E039 Hypothyroidism, unspecified: Secondary | ICD-10-CM | POA: Diagnosis not present

## 2016-12-09 DIAGNOSIS — F330 Major depressive disorder, recurrent, mild: Secondary | ICD-10-CM | POA: Diagnosis not present

## 2016-12-11 DIAGNOSIS — F419 Anxiety disorder, unspecified: Secondary | ICD-10-CM | POA: Diagnosis not present

## 2016-12-11 DIAGNOSIS — F330 Major depressive disorder, recurrent, mild: Secondary | ICD-10-CM | POA: Diagnosis not present

## 2017-01-09 DIAGNOSIS — R600 Localized edema: Secondary | ICD-10-CM | POA: Diagnosis not present

## 2017-01-15 ENCOUNTER — Ambulatory Visit: Payer: Medicare Other | Admitting: Podiatry

## 2017-01-15 DIAGNOSIS — F419 Anxiety disorder, unspecified: Secondary | ICD-10-CM | POA: Diagnosis not present

## 2017-01-15 DIAGNOSIS — F330 Major depressive disorder, recurrent, mild: Secondary | ICD-10-CM | POA: Diagnosis not present

## 2017-01-22 DIAGNOSIS — F419 Anxiety disorder, unspecified: Secondary | ICD-10-CM | POA: Diagnosis not present

## 2017-01-22 DIAGNOSIS — F330 Major depressive disorder, recurrent, mild: Secondary | ICD-10-CM | POA: Diagnosis not present

## 2017-01-29 DIAGNOSIS — F330 Major depressive disorder, recurrent, mild: Secondary | ICD-10-CM | POA: Diagnosis not present

## 2017-01-29 DIAGNOSIS — F419 Anxiety disorder, unspecified: Secondary | ICD-10-CM | POA: Diagnosis not present

## 2017-02-05 DIAGNOSIS — F419 Anxiety disorder, unspecified: Secondary | ICD-10-CM | POA: Diagnosis not present

## 2017-02-05 DIAGNOSIS — F330 Major depressive disorder, recurrent, mild: Secondary | ICD-10-CM | POA: Diagnosis not present

## 2017-02-12 DIAGNOSIS — F419 Anxiety disorder, unspecified: Secondary | ICD-10-CM | POA: Diagnosis not present

## 2017-02-12 DIAGNOSIS — F330 Major depressive disorder, recurrent, mild: Secondary | ICD-10-CM | POA: Diagnosis not present

## 2017-02-16 DIAGNOSIS — K754 Autoimmune hepatitis: Secondary | ICD-10-CM | POA: Diagnosis not present

## 2017-02-16 DIAGNOSIS — M6281 Muscle weakness (generalized): Secondary | ICD-10-CM | POA: Diagnosis not present

## 2017-02-16 DIAGNOSIS — Z7982 Long term (current) use of aspirin: Secondary | ICD-10-CM | POA: Diagnosis not present

## 2017-02-16 DIAGNOSIS — I10 Essential (primary) hypertension: Secondary | ICD-10-CM | POA: Diagnosis not present

## 2017-02-18 DIAGNOSIS — G894 Chronic pain syndrome: Secondary | ICD-10-CM | POA: Diagnosis not present

## 2017-02-20 DIAGNOSIS — E039 Hypothyroidism, unspecified: Secondary | ICD-10-CM | POA: Diagnosis not present

## 2017-02-20 DIAGNOSIS — H04123 Dry eye syndrome of bilateral lacrimal glands: Secondary | ICD-10-CM | POA: Diagnosis not present

## 2017-02-20 DIAGNOSIS — H4010X0 Unspecified open-angle glaucoma, stage unspecified: Secondary | ICD-10-CM | POA: Diagnosis not present

## 2017-02-20 DIAGNOSIS — M6281 Muscle weakness (generalized): Secondary | ICD-10-CM | POA: Diagnosis not present

## 2017-02-26 DIAGNOSIS — F419 Anxiety disorder, unspecified: Secondary | ICD-10-CM | POA: Diagnosis not present

## 2017-02-26 DIAGNOSIS — F330 Major depressive disorder, recurrent, mild: Secondary | ICD-10-CM | POA: Diagnosis not present

## 2017-03-03 DIAGNOSIS — B351 Tinea unguium: Secondary | ICD-10-CM | POA: Diagnosis not present

## 2017-03-03 DIAGNOSIS — E039 Hypothyroidism, unspecified: Secondary | ICD-10-CM | POA: Diagnosis not present

## 2017-03-03 DIAGNOSIS — L603 Nail dystrophy: Secondary | ICD-10-CM | POA: Diagnosis not present

## 2017-03-03 DIAGNOSIS — R600 Localized edema: Secondary | ICD-10-CM | POA: Diagnosis not present

## 2017-03-19 DIAGNOSIS — F419 Anxiety disorder, unspecified: Secondary | ICD-10-CM | POA: Diagnosis not present

## 2017-03-19 DIAGNOSIS — F330 Major depressive disorder, recurrent, mild: Secondary | ICD-10-CM | POA: Diagnosis not present

## 2017-03-26 DIAGNOSIS — H903 Sensorineural hearing loss, bilateral: Secondary | ICD-10-CM | POA: Diagnosis not present

## 2017-03-26 DIAGNOSIS — H9313 Tinnitus, bilateral: Secondary | ICD-10-CM | POA: Diagnosis not present

## 2017-04-02 DIAGNOSIS — F419 Anxiety disorder, unspecified: Secondary | ICD-10-CM | POA: Diagnosis not present

## 2017-04-02 DIAGNOSIS — F330 Major depressive disorder, recurrent, mild: Secondary | ICD-10-CM | POA: Diagnosis not present

## 2017-04-16 DIAGNOSIS — F419 Anxiety disorder, unspecified: Secondary | ICD-10-CM | POA: Diagnosis not present

## 2017-04-16 DIAGNOSIS — F330 Major depressive disorder, recurrent, mild: Secondary | ICD-10-CM | POA: Diagnosis not present

## 2017-04-28 DIAGNOSIS — E039 Hypothyroidism, unspecified: Secondary | ICD-10-CM | POA: Diagnosis not present

## 2017-04-28 DIAGNOSIS — D649 Anemia, unspecified: Secondary | ICD-10-CM | POA: Diagnosis not present

## 2017-04-28 DIAGNOSIS — M6281 Muscle weakness (generalized): Secondary | ICD-10-CM | POA: Diagnosis not present

## 2017-04-30 DIAGNOSIS — F419 Anxiety disorder, unspecified: Secondary | ICD-10-CM | POA: Diagnosis not present

## 2017-04-30 DIAGNOSIS — F330 Major depressive disorder, recurrent, mild: Secondary | ICD-10-CM | POA: Diagnosis not present

## 2017-05-05 DIAGNOSIS — Q845 Enlarged and hypertrophic nails: Secondary | ICD-10-CM | POA: Diagnosis not present

## 2017-05-05 DIAGNOSIS — B351 Tinea unguium: Secondary | ICD-10-CM | POA: Diagnosis not present

## 2017-05-05 DIAGNOSIS — L603 Nail dystrophy: Secondary | ICD-10-CM | POA: Diagnosis not present

## 2017-05-05 DIAGNOSIS — I739 Peripheral vascular disease, unspecified: Secondary | ICD-10-CM | POA: Diagnosis not present

## 2017-05-12 DIAGNOSIS — M6281 Muscle weakness (generalized): Secondary | ICD-10-CM | POA: Diagnosis not present

## 2017-05-12 DIAGNOSIS — H4010X0 Unspecified open-angle glaucoma, stage unspecified: Secondary | ICD-10-CM | POA: Diagnosis not present

## 2017-05-12 DIAGNOSIS — E039 Hypothyroidism, unspecified: Secondary | ICD-10-CM | POA: Diagnosis not present

## 2017-05-12 DIAGNOSIS — K754 Autoimmune hepatitis: Secondary | ICD-10-CM | POA: Diagnosis not present

## 2017-05-13 DIAGNOSIS — F330 Major depressive disorder, recurrent, mild: Secondary | ICD-10-CM | POA: Diagnosis not present

## 2017-05-21 DIAGNOSIS — F330 Major depressive disorder, recurrent, mild: Secondary | ICD-10-CM | POA: Diagnosis not present

## 2017-05-21 DIAGNOSIS — F419 Anxiety disorder, unspecified: Secondary | ICD-10-CM | POA: Diagnosis not present

## 2017-05-28 DIAGNOSIS — F330 Major depressive disorder, recurrent, mild: Secondary | ICD-10-CM | POA: Diagnosis not present

## 2017-05-28 DIAGNOSIS — F419 Anxiety disorder, unspecified: Secondary | ICD-10-CM | POA: Diagnosis not present

## 2017-05-29 DIAGNOSIS — Z79899 Other long term (current) drug therapy: Secondary | ICD-10-CM | POA: Diagnosis not present

## 2017-05-29 DIAGNOSIS — D649 Anemia, unspecified: Secondary | ICD-10-CM | POA: Diagnosis not present

## 2017-05-29 DIAGNOSIS — G894 Chronic pain syndrome: Secondary | ICD-10-CM | POA: Diagnosis not present

## 2017-05-29 DIAGNOSIS — I10 Essential (primary) hypertension: Secondary | ICD-10-CM | POA: Diagnosis not present

## 2017-05-29 DIAGNOSIS — M1990 Unspecified osteoarthritis, unspecified site: Secondary | ICD-10-CM | POA: Diagnosis not present

## 2017-05-29 DIAGNOSIS — K754 Autoimmune hepatitis: Secondary | ICD-10-CM | POA: Diagnosis not present

## 2017-06-04 DIAGNOSIS — F419 Anxiety disorder, unspecified: Secondary | ICD-10-CM | POA: Diagnosis not present

## 2017-06-04 DIAGNOSIS — F330 Major depressive disorder, recurrent, mild: Secondary | ICD-10-CM | POA: Diagnosis not present

## 2017-06-11 DIAGNOSIS — H01003 Unspecified blepharitis right eye, unspecified eyelid: Secondary | ICD-10-CM | POA: Diagnosis not present

## 2017-06-11 DIAGNOSIS — H02403 Unspecified ptosis of bilateral eyelids: Secondary | ICD-10-CM | POA: Diagnosis not present

## 2017-06-11 DIAGNOSIS — H40023 Open angle with borderline findings, high risk, bilateral: Secondary | ICD-10-CM | POA: Diagnosis not present

## 2017-06-11 DIAGNOSIS — H02833 Dermatochalasis of right eye, unspecified eyelid: Secondary | ICD-10-CM | POA: Diagnosis not present

## 2017-06-25 DIAGNOSIS — F419 Anxiety disorder, unspecified: Secondary | ICD-10-CM | POA: Diagnosis not present

## 2017-06-25 DIAGNOSIS — F330 Major depressive disorder, recurrent, mild: Secondary | ICD-10-CM | POA: Diagnosis not present

## 2017-06-29 DIAGNOSIS — D649 Anemia, unspecified: Secondary | ICD-10-CM | POA: Diagnosis not present

## 2017-06-29 DIAGNOSIS — K754 Autoimmune hepatitis: Secondary | ICD-10-CM | POA: Diagnosis not present

## 2017-06-29 DIAGNOSIS — Z79899 Other long term (current) drug therapy: Secondary | ICD-10-CM | POA: Diagnosis not present

## 2017-06-29 DIAGNOSIS — I10 Essential (primary) hypertension: Secondary | ICD-10-CM | POA: Diagnosis not present

## 2017-07-16 DIAGNOSIS — F330 Major depressive disorder, recurrent, mild: Secondary | ICD-10-CM | POA: Diagnosis not present

## 2017-07-16 DIAGNOSIS — F419 Anxiety disorder, unspecified: Secondary | ICD-10-CM | POA: Diagnosis not present

## 2017-07-17 DIAGNOSIS — R319 Hematuria, unspecified: Secondary | ICD-10-CM | POA: Diagnosis not present

## 2017-07-17 DIAGNOSIS — M545 Low back pain: Secondary | ICD-10-CM | POA: Diagnosis not present

## 2017-07-17 DIAGNOSIS — N390 Urinary tract infection, site not specified: Secondary | ICD-10-CM | POA: Diagnosis not present

## 2017-07-18 DIAGNOSIS — M545 Low back pain: Secondary | ICD-10-CM | POA: Diagnosis not present

## 2017-07-18 DIAGNOSIS — M5136 Other intervertebral disc degeneration, lumbar region: Secondary | ICD-10-CM | POA: Diagnosis not present

## 2017-07-20 DIAGNOSIS — K5900 Constipation, unspecified: Secondary | ICD-10-CM | POA: Diagnosis not present

## 2017-07-20 DIAGNOSIS — K567 Ileus, unspecified: Secondary | ICD-10-CM | POA: Diagnosis not present

## 2017-07-20 DIAGNOSIS — R296 Repeated falls: Secondary | ICD-10-CM | POA: Diagnosis not present

## 2017-07-21 DIAGNOSIS — K567 Ileus, unspecified: Secondary | ICD-10-CM | POA: Diagnosis not present

## 2017-07-21 DIAGNOSIS — K5900 Constipation, unspecified: Secondary | ICD-10-CM | POA: Diagnosis not present

## 2017-07-23 DIAGNOSIS — K567 Ileus, unspecified: Secondary | ICD-10-CM | POA: Diagnosis not present

## 2017-07-27 ENCOUNTER — Encounter (HOSPITAL_COMMUNITY): Payer: Self-pay | Admitting: Obstetrics and Gynecology

## 2017-07-27 ENCOUNTER — Other Ambulatory Visit: Payer: Self-pay

## 2017-07-27 ENCOUNTER — Emergency Department (HOSPITAL_COMMUNITY): Payer: Medicare Other

## 2017-07-27 ENCOUNTER — Inpatient Hospital Stay (HOSPITAL_COMMUNITY)
Admission: EM | Admit: 2017-07-27 | Discharge: 2017-07-30 | DRG: 516 | Disposition: A | Discharge status: Home / Self Care | Payer: Medicare Other | Source: Skilled Nursing Facility | Attending: Internal Medicine | Admitting: Internal Medicine

## 2017-07-27 DIAGNOSIS — Z8781 Personal history of (healed) traumatic fracture: Secondary | ICD-10-CM | POA: Diagnosis not present

## 2017-07-27 DIAGNOSIS — Z7982 Long term (current) use of aspirin: Secondary | ICD-10-CM | POA: Diagnosis not present

## 2017-07-27 DIAGNOSIS — Z66 Do not resuscitate: Secondary | ICD-10-CM | POA: Diagnosis not present

## 2017-07-27 DIAGNOSIS — N1330 Unspecified hydronephrosis: Secondary | ICD-10-CM | POA: Diagnosis not present

## 2017-07-27 DIAGNOSIS — I482 Chronic atrial fibrillation: Secondary | ICD-10-CM | POA: Diagnosis present

## 2017-07-27 DIAGNOSIS — E860 Dehydration: Secondary | ICD-10-CM | POA: Diagnosis present

## 2017-07-27 DIAGNOSIS — E876 Hypokalemia: Secondary | ICD-10-CM | POA: Diagnosis present

## 2017-07-27 DIAGNOSIS — Z7401 Bed confinement status: Secondary | ICD-10-CM | POA: Diagnosis not present

## 2017-07-27 DIAGNOSIS — I4891 Unspecified atrial fibrillation: Secondary | ICD-10-CM | POA: Diagnosis not present

## 2017-07-27 DIAGNOSIS — R140 Abdominal distension (gaseous): Secondary | ICD-10-CM | POA: Diagnosis not present

## 2017-07-27 DIAGNOSIS — M4856XA Collapsed vertebra, not elsewhere classified, lumbar region, initial encounter for fracture: Secondary | ICD-10-CM | POA: Diagnosis not present

## 2017-07-27 DIAGNOSIS — Z9842 Cataract extraction status, left eye: Secondary | ICD-10-CM | POA: Diagnosis not present

## 2017-07-27 DIAGNOSIS — E873 Alkalosis: Secondary | ICD-10-CM | POA: Diagnosis present

## 2017-07-27 DIAGNOSIS — R109 Unspecified abdominal pain: Secondary | ICD-10-CM | POA: Diagnosis not present

## 2017-07-27 DIAGNOSIS — N200 Calculus of kidney: Secondary | ICD-10-CM | POA: Diagnosis present

## 2017-07-27 DIAGNOSIS — K567 Ileus, unspecified: Secondary | ICD-10-CM | POA: Diagnosis not present

## 2017-07-27 DIAGNOSIS — N2889 Other specified disorders of kidney and ureter: Secondary | ICD-10-CM | POA: Diagnosis present

## 2017-07-27 DIAGNOSIS — I4510 Unspecified right bundle-branch block: Secondary | ICD-10-CM | POA: Diagnosis not present

## 2017-07-27 DIAGNOSIS — F329 Major depressive disorder, single episode, unspecified: Secondary | ICD-10-CM | POA: Diagnosis not present

## 2017-07-27 DIAGNOSIS — Z8249 Family history of ischemic heart disease and other diseases of the circulatory system: Secondary | ICD-10-CM

## 2017-07-27 DIAGNOSIS — E559 Vitamin D deficiency, unspecified: Secondary | ICD-10-CM | POA: Diagnosis present

## 2017-07-27 DIAGNOSIS — Y92099 Unspecified place in other non-institutional residence as the place of occurrence of the external cause: Secondary | ICD-10-CM

## 2017-07-27 DIAGNOSIS — Z79890 Hormone replacement therapy (postmenopausal): Secondary | ICD-10-CM

## 2017-07-27 DIAGNOSIS — M810 Age-related osteoporosis without current pathological fracture: Secondary | ICD-10-CM | POA: Diagnosis not present

## 2017-07-27 DIAGNOSIS — M2550 Pain in unspecified joint: Secondary | ICD-10-CM | POA: Diagnosis not present

## 2017-07-27 DIAGNOSIS — K754 Autoimmune hepatitis: Secondary | ICD-10-CM | POA: Diagnosis not present

## 2017-07-27 DIAGNOSIS — R1031 Right lower quadrant pain: Secondary | ICD-10-CM | POA: Diagnosis not present

## 2017-07-27 DIAGNOSIS — K5900 Constipation, unspecified: Secondary | ICD-10-CM | POA: Diagnosis not present

## 2017-07-27 DIAGNOSIS — Z9181 History of falling: Secondary | ICD-10-CM | POA: Diagnosis not present

## 2017-07-27 DIAGNOSIS — E039 Hypothyroidism, unspecified: Secondary | ICD-10-CM | POA: Diagnosis not present

## 2017-07-27 DIAGNOSIS — Z79899 Other long term (current) drug therapy: Secondary | ICD-10-CM

## 2017-07-27 DIAGNOSIS — W19XXXA Unspecified fall, initial encounter: Secondary | ICD-10-CM | POA: Diagnosis present

## 2017-07-27 DIAGNOSIS — I10 Essential (primary) hypertension: Secondary | ICD-10-CM | POA: Diagnosis present

## 2017-07-27 DIAGNOSIS — S32010A Wedge compression fracture of first lumbar vertebra, initial encounter for closed fracture: Principal | ICD-10-CM | POA: Diagnosis present

## 2017-07-27 DIAGNOSIS — E871 Hypo-osmolality and hyponatremia: Secondary | ICD-10-CM | POA: Diagnosis present

## 2017-07-27 DIAGNOSIS — R52 Pain, unspecified: Secondary | ICD-10-CM | POA: Diagnosis not present

## 2017-07-27 DIAGNOSIS — N132 Hydronephrosis with renal and ureteral calculous obstruction: Secondary | ICD-10-CM | POA: Diagnosis not present

## 2017-07-27 DIAGNOSIS — F419 Anxiety disorder, unspecified: Secondary | ICD-10-CM | POA: Diagnosis not present

## 2017-07-27 DIAGNOSIS — Z9841 Cataract extraction status, right eye: Secondary | ICD-10-CM

## 2017-07-27 DIAGNOSIS — M545 Low back pain: Secondary | ICD-10-CM | POA: Diagnosis not present

## 2017-07-27 DIAGNOSIS — S32000A Wedge compression fracture of unspecified lumbar vertebra, initial encounter for closed fracture: Secondary | ICD-10-CM | POA: Diagnosis not present

## 2017-07-27 LAB — URINALYSIS, ROUTINE W REFLEX MICROSCOPIC
Bilirubin Urine: NEGATIVE
GLUCOSE, UA: NEGATIVE mg/dL
Hgb urine dipstick: NEGATIVE
KETONES UR: NEGATIVE mg/dL
Nitrite: NEGATIVE
Protein, ur: NEGATIVE mg/dL
Specific Gravity, Urine: 1.005 (ref 1.005–1.030)
pH: 7 (ref 5.0–8.0)

## 2017-07-27 LAB — COMPREHENSIVE METABOLIC PANEL
ALBUMIN: 3.2 g/dL — AB (ref 3.5–5.0)
ALT: 12 U/L — ABNORMAL LOW (ref 14–54)
ANION GAP: 9 (ref 5–15)
AST: 17 U/L (ref 15–41)
Alkaline Phosphatase: 122 U/L (ref 38–126)
BILIRUBIN TOTAL: 0.3 mg/dL (ref 0.3–1.2)
BUN: 13 mg/dL (ref 6–20)
CHLORIDE: 91 mmol/L — AB (ref 101–111)
CO2: 29 mmol/L (ref 22–32)
Calcium: 9.3 mg/dL (ref 8.9–10.3)
Creatinine, Ser: 0.72 mg/dL (ref 0.44–1.00)
GFR calc Af Amer: >60 mL/min (ref >60)
Glucose, Bld: 116 mg/dL — ABNORMAL HIGH (ref 65–99)
POTASSIUM: 3.5 mmol/L (ref 3.5–5.1)
Sodium: 129 mmol/L — ABNORMAL LOW (ref 135–145)
TOTAL PROTEIN: 7.1 g/dL (ref 6.5–8.1)

## 2017-07-27 LAB — CBC
HEMATOCRIT: 36.5 % (ref 36.0–46.0)
HEMOGLOBIN: 12.5 g/dL (ref 12.0–15.0)
MCH: 33.8 pg (ref 26.0–34.0)
MCHC: 34.2 g/dL (ref 30.0–36.0)
MCV: 98.6 fL (ref 78.0–100.0)
Platelets: 263 10*3/uL (ref 150–400)
RBC: 3.7 MIL/uL — ABNORMAL LOW (ref 3.87–5.11)
RDW: 13.1 % (ref 11.5–15.5)
WBC: 8.2 10*3/uL (ref 4.0–10.5)

## 2017-07-27 LAB — TROPONIN I

## 2017-07-27 LAB — LIPASE, BLOOD: Lipase: 32 U/L (ref 11–51)

## 2017-07-27 LAB — POC OCCULT BLOOD, ED: Fecal Occult Bld: NEGATIVE

## 2017-07-27 MED ORDER — IOHEXOL 300 MG/ML  SOLN
30.0000 mL | Freq: Once | INTRAMUSCULAR | Status: AC | PRN
  Administered 2017-07-27: 30 mL via ORAL

## 2017-07-27 MED ORDER — CALCIUM CARBONATE-VITAMIN D 500-200 MG-UNIT PO TABS
1.0000 | ORAL_TABLET | Freq: Every day | ORAL | Status: DC
  Administered 2017-07-28 – 2017-07-30 (×3): 1 via ORAL
  Filled 2017-07-27 (×3): qty 1

## 2017-07-27 MED ORDER — LACTULOSE 10 GM/15ML PO SOLN: 20.0000 g | Freq: Two times a day (BID) | ORAL | Status: DC | PRN

## 2017-07-27 MED ORDER — MIRTAZAPINE 15 MG PO TABS
45.0000 mg | ORAL_TABLET | Freq: Every day | ORAL | Status: DC
  Administered 2017-07-27 – 2017-07-29 (×3): 45 mg via ORAL
  Filled 2017-07-27: qty 3
  Filled 2017-07-27: qty 2
  Filled 2017-07-27: qty 3

## 2017-07-27 MED ORDER — ONDANSETRON HCL 4 MG PO TABS: 4.0000 mg | ORAL_TABLET | Freq: Four times a day (QID) | ORAL | Status: DC | PRN

## 2017-07-27 MED ORDER — ASPIRIN EC 81 MG PO TBEC: 81.0000 mg | DELAYED_RELEASE_TABLET | Freq: Every day | ORAL | Status: DC

## 2017-07-27 MED ORDER — ACETAMINOPHEN 650 MG RE SUPP: 650.0000 mg | Freq: Four times a day (QID) | RECTAL | Status: DC | PRN

## 2017-07-27 MED ORDER — SODIUM CHLORIDE 0.9 % IV SOLN
INTRAVENOUS | Status: DC
  Administered 2017-07-27: 16:00:00 via INTRAVENOUS

## 2017-07-27 MED ORDER — VERAPAMIL HCL ER 120 MG PO TBCR: 60.0000 mg | EXTENDED_RELEASE_TABLET | Freq: Once | ORAL | Status: DC

## 2017-07-27 MED ORDER — VERAPAMIL HCL 2.5 MG/ML IV SOLN
2.5000 mg | Freq: Once | INTRAVENOUS | Status: AC
  Administered 2017-07-27: 2.5 mg via INTRAVENOUS
  Filled 2017-07-27: qty 2

## 2017-07-27 MED ORDER — LORATADINE 10 MG PO TABS
10.0000 mg | ORAL_TABLET | Freq: Every day | ORAL | Status: DC
  Administered 2017-07-28 – 2017-07-30 (×3): 10 mg via ORAL
  Filled 2017-07-27 (×3): qty 1

## 2017-07-27 MED ORDER — SODIUM CHLORIDE 0.9 % IV SOLN
INTRAVENOUS | Status: DC
  Administered 2017-07-28 – 2017-07-30 (×5): via INTRAVENOUS

## 2017-07-27 MED ORDER — MERCAPTOPURINE 50 MG PO TABS
25.0000 mg | ORAL_TABLET | Freq: Every day | ORAL | Status: DC
  Administered 2017-07-28 – 2017-07-30 (×3): 25 mg via ORAL
  Filled 2017-07-27 (×3): qty 1

## 2017-07-27 MED ORDER — MENTHOL (TOPICAL ANALGESIC) 4 % EX GEL: 1.0000 "application " | Freq: Three times a day (TID) | CUTANEOUS | Status: DC

## 2017-07-27 MED ORDER — MORPHINE SULFATE (PF) 2 MG/ML IV SOLN
2.0000 mg | INTRAVENOUS | Status: DC | PRN
  Administered 2017-07-28 (×2): 2 mg via INTRAVENOUS
  Filled 2017-07-27: qty 2
  Filled 2017-07-27: qty 1

## 2017-07-27 MED ORDER — ONDANSETRON HCL 4 MG/2ML IJ SOLN
4.0000 mg | Freq: Four times a day (QID) | INTRAMUSCULAR | Status: DC | PRN
Start: 2017-07-27 — End: 2017-07-30

## 2017-07-27 MED ORDER — LORAZEPAM 0.5 MG PO TABS
0.5000 mg | ORAL_TABLET | Freq: Two times a day (BID) | ORAL | Status: DC
  Administered 2017-07-27 – 2017-07-30 (×6): 0.5 mg via ORAL
  Filled 2017-07-27 (×6): qty 1

## 2017-07-27 MED ORDER — CALCIUM CARBONATE-VITAMIN D 600-200 MG-UNIT PO CAPS: 1.0000 | ORAL_CAPSULE | Freq: Every day | ORAL | Status: DC

## 2017-07-27 MED ORDER — BUSPIRONE HCL 5 MG PO TABS
15.0000 mg | ORAL_TABLET | Freq: Two times a day (BID) | ORAL | Status: DC
  Administered 2017-07-27 – 2017-07-30 (×6): 15 mg via ORAL
  Filled 2017-07-27 (×2): qty 1
  Filled 2017-07-27: qty 2
  Filled 2017-07-27 (×3): qty 1

## 2017-07-27 MED ORDER — MORPHINE SULFATE (PF) 4 MG/ML IV SOLN
4.0000 mg | Freq: Once | INTRAVENOUS | Status: AC
  Administered 2017-07-27: 4 mg via INTRAVENOUS
  Filled 2017-07-27: qty 1

## 2017-07-27 MED ORDER — ACETAMINOPHEN 325 MG PO TABS: 650.0000 mg | ORAL_TABLET | Freq: Four times a day (QID) | ORAL | Status: DC | PRN

## 2017-07-27 MED ORDER — GLYCERIN-HYPROMELLOSE-PEG 400 0.2-0.36-1 % OP SOLN: 1.0000 [drp] | Freq: Three times a day (TID) | OPHTHALMIC | Status: DC

## 2017-07-27 MED ORDER — HYDROCODONE-ACETAMINOPHEN 5-325 MG PO TABS
1.0000 | ORAL_TABLET | Freq: Four times a day (QID) | ORAL | Status: DC | PRN
Start: 2017-07-27 — End: 2017-07-27

## 2017-07-27 MED ORDER — VERAPAMIL HCL ER 120 MG PO TBCR
120.0000 mg | EXTENDED_RELEASE_TABLET | Freq: Every day | ORAL | Status: DC
  Administered 2017-07-28 – 2017-07-29 (×2): 120 mg via ORAL
  Filled 2017-07-27 (×4): qty 1

## 2017-07-27 MED ORDER — OCUSOFT LID SCRUB EX PADS: 1.0000 | MEDICATED_PAD | Freq: Two times a day (BID) | CUTANEOUS | Status: DC

## 2017-07-27 MED ORDER — SENNA 8.6 MG PO TABS
1.0000 | ORAL_TABLET | Freq: Every day | ORAL | Status: DC
  Administered 2017-07-28 – 2017-07-30 (×3): 8.6 mg via ORAL
  Filled 2017-07-27 (×3): qty 1

## 2017-07-27 MED ORDER — LEVOTHYROXINE SODIUM 50 MCG PO TABS
50.0000 ug | ORAL_TABLET | Freq: Every day | ORAL | Status: DC
  Administered 2017-07-28 – 2017-07-30 (×3): 50 ug via ORAL
  Filled 2017-07-27 (×3): qty 1

## 2017-07-27 MED ORDER — VITAMIN D 1000 UNITS PO TABS
1000.0000 [IU] | ORAL_TABLET | Freq: Every day | ORAL | Status: DC
  Administered 2017-07-28 – 2017-07-30 (×3): 1000 [IU] via ORAL
  Filled 2017-07-27 (×3): qty 1

## 2017-07-27 MED ORDER — SODIUM CHLORIDE 0.9 % IV BOLUS
500.0000 mL | Freq: Once | INTRAVENOUS | Status: AC
  Administered 2017-07-27: 500 mL via INTRAVENOUS

## 2017-07-27 MED ORDER — IOPAMIDOL (ISOVUE-300) INJECTION 61%
100.0000 mL | Freq: Once | INTRAVENOUS | Status: AC | PRN
  Administered 2017-07-27: 100 mL via INTRAVENOUS

## 2017-07-27 MED ORDER — IOPAMIDOL (ISOVUE-300) INJECTION 61%
INTRAVENOUS | Status: AC
  Filled 2017-07-27: qty 100

## 2017-07-27 MED ORDER — PROPYLENE GLYCOL-GLYCERIN 1-0.3 % OP SOLN: 1.0000 [drp] | Freq: Three times a day (TID) | OPHTHALMIC | Status: DC

## 2017-07-27 MED ORDER — ONDANSETRON HCL 4 MG/2ML IJ SOLN
4.0000 mg | Freq: Once | INTRAMUSCULAR | Status: AC
  Administered 2017-07-27: 4 mg via INTRAVENOUS
  Filled 2017-07-27: qty 2

## 2017-07-27 NOTE — ED Triage Notes (Signed)
Per EMS-facility states 3 days of abdominal distention and constipation-patient is complaining of lower back pain with movement-no V/N-unsure of whether patients has been given meds for constipation-states imaging done which showed no signs of constipation or obstruction.

## 2017-07-27 NOTE — Progress Notes (Signed)
ED TO INPATIENT HANDOFF REPORT  Name/Age/Gender Marisa Smith 84 y.o. female  Code Status    Code Status Orders  (From admission, onward)        Start     Ordered   07/27/17 2206  Do not attempt resuscitation (DNR)  Continuous    Question Answer Comment  In the event of cardiac or respiratory ARREST Do not call a "code blue"   In the event of cardiac or respiratory ARREST Do not perform Intubation, CPR, defibrillation or ACLS   In the event of cardiac or respiratory ARREST Use medication by any route, position, wound care, and other measures to relive pain and suffering. May use oxygen, suction and manual treatment of airway obstruction as needed for comfort.   Comments Yellow form with patient      07/27/17 2206    Code Status History    Date Active Date Inactive Code Status Order ID Comments User Context   02/29/2016 2302 03/03/2016 2122 Full Code [id number]  Roseanne Kaufman, MD ED   02/24/2013 1730 02/28/2013 2358 Full Code [id number]  Grier Mitts, PA-C Inpatient   02/22/2013 1843 02/24/2013 1730 Full Code [id number]  Mendel Corning, MD Inpatient   02/16/2013 1437 02/18/2013 1953 Full Code [id number]  Cristal Ford, DO Inpatient   11/24/2011 0319 11/25/2011 2042 Full Code [id number]  Tonye Royalty, RN Inpatient    Advance Directive Documentation     Most Recent Value  Type of Advance Directive  Healthcare Power of Attorney, Living will, Out of facility DNR (pink MOST or yellow form)  Pre-existing out of facility DNR order (yellow form or pink MOST form)  -  "MOST" Form in Place?  -      Home/SNF/Other Nursing Home  Chief Complaint abd pain   Level of Care/Admitting Diagnosis ED Disposition    ED Disposition Condition [place]: [employer] [[id number]]  Level of Care: Telemetry [5]  Admit to tele based on following criteria: Complex arrhythmia (Bradycardia/Tachycardia)  Diagnosis: Closed compression fracture of L1 lumbar  vertebra, initial encounter [facility]) [[id number]]  Admitting Physician: Etta Quill [phone]  Attending Physician: Etta Quill [[id number]]  PT Class (Do Not Modify): Observation [104]  PT Acc Code (Do Not Modify): Observation [10022]       Medical History Past Medical History:  Diagnosis Date  . ALLERGIC RHINITIS CAUSE UNSPECIFIED   . ANXIETY   . Autoimmune hepatitis ([place])    on MP6, prev pred  . DEPRESSION, MAJOR, MODERATE   . Headache(784.0) 02/15/2010  . HYPERTENSION, BENIGN ESSENTIAL   . HYPOTHYROIDISM   . OSTEOPOROSIS    fx L prox hum and L distal radius, both requiring ORIF 02/2013  . Unspecified vitamin D deficiency     Allergies Allergies  Allergen Reactions  . Amlodipine Besylate     REACTION: itching  . Azatadine     Per MAR?  [name] Kitchen Azathioprine     REACTION: nausea  . Penicillins     REACTION: yeast infection Per MAR  . Sertraline Hcl     REACTION: rash  . Sulfonamide Derivatives     REACTION: swelling and itching    IV Location/Drains/Wounds Patient Lines/Drains/Airways Status   Active Line/Drains/Airways    Name:   Placement date:   Placement time:   Site:   Days:   Peripheral IV 07/27/17 Left Forearm   07/27/17    1624    Forearm   less than 1   Closed  System Drain 1 Right;Anterior;Medial Other (Comment) Other (Comment) 7 Fr.   03/01/16    1006    Other (Comment)   513   Incision 02/16/13 Arm Left   02/16/13    2048     1622   Incision 02/24/13 Arm Left   02/24/13    1613     1614   Incision 02/24/13 Shoulder Left   02/24/13    2035     1614   Incision (Closed) 03/01/16 Arm Right   03/01/16    0831     513   Wound 02/16/13 Laceration Head Left 1 cm behind ear; bleeding controlled   02/16/13    0914    Head   1622          Labs/Imaging Results for orders placed or performed during the hospital encounter of 07/27/17 (from the past 48 hour(s))  POC occult blood, ED Provider will collect     Status: None   Collection Time: 07/27/17  3:26 PM  Result  Value Ref Range   Fecal Occult Bld NEGATIVE NEGATIVE  Lipase, blood     Status: None   Collection Time: 07/27/17  3:46 PM  Result Value Ref Range   Lipase 32 11 - 51 U/L    Comment: Performed at [facility], [place] [address] [place], [place] [address]  Comprehensive metabolic panel     Status: Abnormal   Collection Time: 07/27/17  3:46 PM  Result Value Ref Range   Sodium 129 (L) 135 - 145 mmol/L   Potassium 3.5 3.5 - 5.1 mmol/L   Chloride 91 (L) 101 - 111 mmol/L   CO2 29 22 - 32 mmol/L   Glucose, Bld 116 (H) 65 - 99 mg/dL   BUN 13 6 - 20 mg/dL   Creatinine, Ser 0.72 0.44 - 1.00 mg/dL   Calcium 9.3 8.9 - 10.3 mg/dL   Total Protein 7.1 6.5 - 8.1 g/dL   Albumin 3.2 (L) 3.5 - 5.0 g/dL   AST 17 15 - 41 U/L   ALT 12 (L) 14 - 54 U/L   Alkaline Phosphatase 122 38 - 126 U/L   Total Bilirubin 0.3 0.3 - 1.2 mg/dL   GFR calc non Af Amer >60 >60 mL/min   GFR calc Af Amer >60 >60 mL/min    Comment: (NOTE) The eGFR has been calculated using the CKD EPI equation. This calculation has not been validated in all clinical situations. eGFR's persistently <60 mL/min signify possible Chronic Kidney Disease.    Anion gap 9 5 - 15    Comment: Performed at [facility], [place] [address], [place], [place] [address]  CBC     Status: Abnormal   Collection Time: 07/27/17  3:46 PM  Result Value Ref Range   WBC 8.2 4.0 - 10.5 K/uL   RBC 3.70 (L) 3.87 - 5.11 MIL/uL   Hemoglobin 12.5 12.0 - 15.0 g/dL   HCT 36.5 36.0 - 46.0 %   MCV 98.6 78.0 - 100.0 fL   MCH 33.8 26.0 - 34.0 pg   MCHC 34.2 30.0 - 36.0 g/dL   RDW 13.1 11.5 - 15.5 %   Platelets 263 150 - 400 K/uL    Comment: Performed at [facility], [place] [address], [place], [place] [address]  Urinalysis, Routine w reflex microscopic     Status: Abnormal   Collection Time: 07/27/17  3:46 PM  Result Value Ref Range   Color, Urine YELLOW YELLOW  APPearance CLEAR CLEAR   Specific Gravity, Urine  1.005 1.005 - 1.030   pH 7.0 5.0 - 8.0   Glucose, UA NEGATIVE NEGATIVE mg/dL   Hgb urine dipstick NEGATIVE NEGATIVE   Bilirubin Urine NEGATIVE NEGATIVE   Ketones, ur NEGATIVE NEGATIVE mg/dL   Protein, ur NEGATIVE NEGATIVE mg/dL   Nitrite NEGATIVE NEGATIVE   Leukocytes, UA TRACE (A) NEGATIVE   RBC / HPF 0-5 0 - 5 RBC/hpf   WBC, UA 6-10 0 - 5 WBC/hpf   Bacteria, UA RARE (A) NONE SEEN   Squamous Epithelial / LPF 0-5 0 - 5   Mucus PRESENT     Comment: Performed at [facility], [place] [address] [place], [place] [address]  Troponin I     Status: None   Collection Time: 07/27/17  3:46 PM  Result Value Ref Range   Troponin I <0.03 <0.03 ng/mL    Comment: Performed at [facility], [place] [address] [place], [place] [address]   Ct Abdomen Pelvis W Contrast  Result Date: 07/27/2017 CLINICAL DATA:  Worsening abdominal pain and distension. Constipation. EXAM: CT ABDOMEN AND PELVIS WITH CONTRAST TECHNIQUE: Multidetector CT imaging of the abdomen and pelvis was performed using the standard protocol following bolus administration of intravenous contrast. CONTRAST:  120m ISOVUE-300 IOPAMIDOL (ISOVUE-300) INJECTION 61%, 373mOMNIPAQUE IOHEXOL 300 MG/ML SOLN COMPARISON:  01/13/2014 abdominal sonogram. FINDINGS: Lower chest: Small dependent bilateral pleural effusions. Mild dependent atelectasis at both lung bases. Coronary atherosclerosis. Hepatobiliary: Normal liver size. No liver mass. Normal gallbladder with no radiopaque cholelithiasis. No biliary ductal dilatation. Pancreas: Normal, with no mass or duct dilation. Spleen: Normal size. No mass. Adrenals/Urinary Tract: Normal adrenals. Mild left hydronephrosis. No right hydronephrosis. Ureters are normal caliber. No evidence of obstructing stone or mass in the left urinary tract. Hypodense 1.7 cm renal cortical lesion in the lower left kidney (series 2/image 31). No additional renal lesions. Moderately distended bladder,  which otherwise appears normal. Stomach/Bowel: Moderate hiatal hernia. Otherwise normal nondistended stomach. Normal caliber small bowel with no small bowel wall thickening. Normal appendix. Oral contrast transits to the right colon. No large bowel wall thickening, diverticulosis or significant pericolonic fat stranding. Mild stool throughout the large bowel. Vascular/Lymphatic: Atherosclerotic nonaneurysmal abdominal aorta. Patent portal, splenic, hepatic and renal veins. No pathologically enlarged lymph nodes in the abdomen or pelvis. Reproductive: Mildly heterogeneous uterus with scattered uterine calcifications, suggesting small uterine fibroids. No adnexal masses. Other: No pneumoperitoneum, ascites or focal fluid collection. Musculoskeletal: No aggressive appearing focal osseous lesions. Severe L1 vertebral compression fracture of indeterminate chronicity, which appears new since 03/02/2016 chest CT. Marked degenerative disc disease at L2-3. IMPRESSION: 1. No evidence of bowel obstruction or acute bowel inflammation. Mild colonic stool. 2. Mild left hydronephrosis, without appreciable obstructing stone or mass. Moderately distended bladder. Suggest follow-up renal sonogram after bladder voiding to assess for persistent left hydronephrosis. 3. Severe L1 vertebral compression fracture of indeterminate chronicity, which appears new since 03/02/2016 chest CT. 4. Small dependent bilateral pleural effusions. 5. Indeterminate low-attenuation 1.7 cm renal cortical lesion in the lower left kidney, cannot exclude renal cell carcinoma. Further evaluation with short-term outpatient MRI (preferred) or CT abdomen without and with IV contrast is indicated and may be performed as clinically warranted. 6. Chronic findings include: Aortic Atherosclerosis (ICD10-I70.0). Coronary atherosclerosis. Moderate hiatal hernia. Myomatous uterus. Electronically Signed   By: JaIlona Sorrel.D.   On: 07/27/2017 20:13    Pending  Labs Unresulted Labs (From admission, onward)   Start  Ordered   07/28/17 7741  Basic metabolic panel  Tomorrow morning,   R     07/27/17 2206      Vitals/Pain Today's Vitals   07/27/17 2100 07/27/17 2107 07/27/17 2200 07/27/17 2300  BP: (!) 147/106 (!) 147/106 116/67 134/85  Pulse: (!) 150  (!) 124 65  Resp: (!) 24  (!) 29 (!) 24  Temp:      TempSrc:      SpO2: 96%  95% 95%    Isolation Precautions No active isolations  Medications Medications  sodium chloride 0.9 % bolus 500 mL (0 mLs Intravenous Stopped 07/27/17 1722)    And  0.9 %  sodium chloride infusion ( Intravenous New Bag/Given 07/27/17 1621)  iopamidol (ISOVUE-300) 61 % injection (has no administration in time range)  LORazepam (ATIVAN) tablet 0.5 mg (has no administration in time range)  levothyroxine (SYNTHROID, LEVOTHROID) tablet 50 mcg (has no administration in time range)  mirtazapine (REMERON) tablet 45 mg (has no administration in time range)  Menthol (Topical Analgesic) 4 % GEL 1 application (has no administration in time range)  Propylene Glycol-Glycerin 1-0.3 % SOLN 1 drop (has no administration in time range)  senna (SENOKOT) tablet 8.6 mg (has no administration in time range)  verapamil (CALAN-SR) CR tablet 120 mg (has no administration in time range)  busPIRone (BUSPAR) tablet 15 mg (has no administration in time range)  Calcium Carbonate-Vitamin D 600-200 MG-UNIT CAPS 1 capsule (has no administration in time range)  loratadine (CLARITIN) tablet 10 mg (has no administration in time range)  cholecalciferol (VITAMIN D) tablet 1,000 Units (has no administration in time range)  Glycerin-Hypromellose-PEG 400 0.2-0.36-1 % SOLN 1 drop (has no administration in time range)  lactulose (CHRONULAC) 10 GM/15ML solution 20 g (has no administration in time range)  mercaptopurine (PURINETHOL) tablet 25 mg (has no administration in time range)  acetaminophen (TYLENOL) tablet 650 mg (has no administration in time  range)    Or  acetaminophen (TYLENOL) suppository 650 mg (has no administration in time range)  ondansetron (ZOFRAN) tablet 4 mg (has no administration in time range)    Or  ondansetron (ZOFRAN) injection 4 mg (has no administration in time range)  morphine 2 MG/ML injection 2-4 mg (has no administration in time range)  0.9 %  sodium chloride infusion (has no administration in time range)  verapamil (CALAN-SR) CR tablet 60 mg (has no administration in time range)  ondansetron (ZOFRAN) injection 4 mg (4 mg Intravenous Given 07/27/17 1623)  verapamil (ISOPTIN) injection 2.5 mg (2.5 mg Intravenous Given 07/27/17 1623)  iohexol (OMNIPAQUE) 300 MG/ML solution 30 mL (30 mLs Oral Contrast Given 07/27/17 1924)  iopamidol (ISOVUE-300) 61 % injection 100 mL (100 mLs Intravenous Contrast Given 07/27/17 1924)  verapamil (ISOPTIN) injection 2.5 mg (2.5 mg Intravenous Given 07/27/17 2107)  morphine 4 MG/ML injection 4 mg (4 mg Intravenous Given 07/27/17 2107)    Mobility non-ambulatory

## 2017-07-27 NOTE — ED Provider Notes (Addendum)
[facility]-EMERGENCY DEPT Provider Note   CSN: [id number] Arrival date & time: 07/27/17  1437     History   Chief Complaint Chief Complaint  Patient presents with  . Abdominal Pain    HPI Marisa Smith is a 84 y.o. female.  HPI Pt states she has felt constipated.   She has not had a normal bowel movement for several days.  Pt is not sure of the exact day.  She has been given treatment at her living facility.  She states she is on a liquid diet but it is not helping.   She has been having pain that comes and goes.  Overall, she feels sore and that continues.  She has had some vomiting since this started.  She denies any vomiting today.  She is not sure how her appetite is because she is on a liquid diet.  No fevers.  Overall she feels poorly. Past Medical History:  Diagnosis Date  . ALLERGIC RHINITIS CAUSE UNSPECIFIED   . ANXIETY   . Autoimmune hepatitis (HCC)    on MP6, prev pred  . DEPRESSION, MAJOR, MODERATE   . Headache(784.0) 02/15/2010  . HYPERTENSION, BENIGN ESSENTIAL   . HYPOTHYROIDISM   . OSTEOPOROSIS    fx L prox hum and L distal radius, both requiring ORIF 02/2013  . Unspecified vitamin D deficiency     Patient Active Problem List   Diagnosis Date Noted  . Wrist fracture 02/29/2016  . Distal radius fracture, right 02/29/2016  . Proximal left humerus fracture 02/22/2013  . Radius fracture 02/16/2013  . Bradycardia 12/06/2011  . Hyponatremia 11/24/2011  . Hyperglycemia 11/24/2011  . Autoimmune hepatitis (HCC)   . Headache(784.0) 02/15/2010  . TREMOR 02/02/2010  . ANXIETY 12/11/2009  . DEPRESSION, MAJOR, MODERATE 10/19/2009  . HYPOTHYROIDISM 04/26/2009  . Unspecified vitamin D deficiency 04/26/2009  . HYPERTENSION, BENIGN ESSENTIAL 04/26/2009  . OSTEOPOROSIS 04/26/2009    Past Surgical History:  Procedure Laterality Date  . CATARACT EXTRACTION  2008   Left eye  . CATARACT EXTRACTION  2009   Right eye  . HUMERUS IM NAIL Left  02/2013   DR Sol Blazing  . HUMERUS IM NAIL Left 02/24/2013   Procedure: INTRAMEDULLARY (IM) NAIL LEFT PROXIMAL  HUMERUS;  Surgeon: Mable Paris, MD;  Location: [facility] OR;  Service: Orthopedics;  Laterality: Left;  . OPEN REDUCTION INTERNAL FIXATION (ORIF) DISTAL RADIAL FRACTURE Left 02/16/2013   Procedure: OPEN REDUCTION INTERNAL FIXATION (ORIF) DISTAL RADIAL FRACTURE;  Surgeon: Sharma Covert, MD;  Location: [facility] OR;  Service: Orthopedics;  Laterality: Left;  . ORIF RADIAL FRACTURE Right 03/01/2016   Procedure: OPEN REDUCTION INTERNAL FIXATION (ORIF) RADIUS FRACTURE;  Surgeon: Dominica Severin, MD;  Location: [facility] OR;  Service: Orthopedics;  Laterality: Right;  . TONSILLECTOMY  1945     OB History    Gravida      Para      Term      Preterm      AB      Living  1     SAB      TAB      Ectopic      Multiple      Live Births               Home Medications    Prior to Admission medications   Medication Sig Start Date End Date Taking? Authorizing Provider  acetaminophen (TYLENOL) 325 MG tablet Take 325 mg by mouth every 4 (four) hours  as needed for mild pain (right hip pain).   Yes [provider]  acetaminophen (TYLENOL) 500 MG tablet Take 1,000 mg by mouth every 6 (six) hours as needed for mild pain or headache.    Yes [provider]  aspirin EC 81 MG EC tablet Take 81 mg by mouth daily.    Yes [provider]  busPIRone (BUSPAR) 15 MG tablet Take 15 mg by mouth 2 (two) times daily.  12/20/13  Yes [provider]  Calcium Carbonate-Vitamin D 600-200 MG-UNIT CAPS Take 1 capsule by mouth daily.   Yes [provider]  cetirizine (ZYRTEC) 10 MG tablet Take 10 mg by mouth daily.   Yes [provider]  Cholecalciferol (VITAMIN D3) 1000 units CAPS Take 1,000 Units by mouth daily.    Yes [provider]  Eyelid Cleansers (OCUSOFT LID SCRUB) PADS Place 1 each into both eyes 2 (two) times daily.   Yes [provider]  Glycerin-Hypromellose-PEG 400 0.2-0.36-1 % SOLN Place 1 drop into both eyes 3 (three) times daily.   Yes [provider]  HYDROcodone-acetaminophen (NORCO) 5-325 MG tablet Take 1 tablet by mouth every 6 (six) hours as needed for moderate pain. 03/03/16  Yes Karie Chimera, PA-C  lactulose (CHRONULAC) 10 GM/15ML solution Take 30 mLs by mouth 2 (two) times daily as needed for constipation. 07/24/17  Yes [provider]  levothyroxine (SYNTHROID, LEVOTHROID) 50 MCG tablet Take 1 tablet (50 mcg total) by mouth daily before breakfast. 05/03/13  Yes Newt Lukes, MD  LORazepam (ATIVAN) 0.5 MG tablet Take 0.5 mg by mouth 2 (two) times daily.  12/22/13  Yes [provider]  Menthol, Topical Analgesic, (BIOFREEZE ROLL-ON) 4 % GEL Apply 1 application topically 3 (three) times daily.   Yes [provider]  mercaptopurine (PURINETHOL) 50 MG tablet Take 0.5 tablets (25 mg total) by mouth daily. Give on an empty stomach 1 hour before or 2 hours after meals. Caution: Chemotherapy. 01/05/14  Yes Rachael Fee, MD  mirtazapine (REMERON) 45 MG tablet Take 45 mg by mouth at bedtime.  12/20/13  Yes [provider]  ondansetron (ZOFRAN ODT) 8 MG disintegrating tablet Take 1 tablet (8 mg total) by mouth every 8 (eight) hours as needed for nausea or vomiting. 02/05/16  Yes Azalia Bilis, MD  Propylene Glycol-Glycerin (ARTIFICIAL TEARS) 1-0.3 % SOLN Place 1 drop into both eyes 3 (three) times daily.   Yes [provider]  senna (SENOKOT) 8.6 MG TABS tablet Take 1 tablet by mouth daily.   Yes [provider]  verapamil (VERELAN PM) 120 MG 24 hr capsule Take 120 mg by mouth at bedtime.   Yes [provider]    Family History Family History  Problem Relation Age of Onset  . Hypertension Mother   . Stroke Mother   . Heart disease Father   . Stroke Sister     Social History Social History   Tobacco Use  . Smoking status:  Never Smoker  . Smokeless tobacco: Never Used  . 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 Alcohol use: No    Alcohol/week: 0.0 oz  . Drug use: No     Allergies   Amlodipine besylate; Azatadine; Azathioprine; Penicillins; Sertraline hcl; and Sulfonamide derivatives   Review of Systems Review of Systems  Constitutional: Negative for fever.  Musculoskeletal: Positive for back pain.  All other systems reviewed and  are negative.    Physical Exam Updated Vital Signs BP 123/74   Pulse (!) 114   Temp 98.9 F (37.2 C) (Oral)   Resp 12   SpO2 98%   Physical Exam  Constitutional:  Non-toxic appearance. She appears ill.  HENT:  Head: Normocephalic and atraumatic.  Right Ear: External ear normal.  Left Ear: External ear normal.  Eyes: Conjunctivae are normal. Right eye exhibits no discharge. Left eye exhibits no discharge. No scleral icterus.  Neck: Neck supple. No tracheal deviation present.  Cardiovascular: Intact distal pulses. An irregularly irregular rhythm present. Tachycardia present.  Pulmonary/Chest: Effort normal and breath sounds normal. No stridor. No respiratory distress. She has no wheezes. She has no rales.  Abdominal: Soft. Bowel sounds are normal. She exhibits no distension. There is tenderness in the right lower quadrant, suprapubic area and left lower quadrant. There is no rebound and no guarding. No hernia.  Genitourinary:  Genitourinary Comments: No rectal mass or impaction  Musculoskeletal: She exhibits no edema or tenderness.  Neurological: She is alert. She has normal strength. No cranial nerve deficit (no facial droop, extraocular movements intact, no slurred speech) or sensory deficit. She exhibits normal muscle tone. She displays no seizure activity. Coordination normal.  Skin: Skin is warm and dry. No rash noted.  Psychiatric: She has a normal mood and  affect.  Nursing note and vitals reviewed.    ED Treatments / Results  Labs (all labs ordered are listed, but only abnormal results are displayed) Labs Reviewed  COMPREHENSIVE METABOLIC PANEL - Abnormal; Notable for the following components:      Result Value   Sodium 129 (*)    Chloride 91 (*)    Glucose, Bld 116 (*)    Albumin 3.2 (*)    ALT 12 (*)    All other components within normal limits  CBC - Abnormal; Notable for the following components:   RBC 3.70 (*)    All other components within normal limits  URINALYSIS, ROUTINE W REFLEX MICROSCOPIC - Abnormal; Notable for the following components:   Leukocytes, UA TRACE (*)    Bacteria, UA RARE (*)    All other components within normal limits  LIPASE, BLOOD  TROPONIN I  POC OCCULT BLOOD, ED    EKG EKG Interpretation  Date/Time:  Monday July 27 2017 16:01:28 EDT Ventricular Rate:  108 PR Interval:    QRS Duration: 138 QT Interval:  360 QTC Calculation: 483 R Axis:   79 Text Interpretation:  Atrial fibrillation Right bundle branch block Baseline wander in lead(s) V5 V6 a fib is new compared to last tracing Confirmed by Linwood DibblesKnapp, Jozy Mcphearson [phone]([id number]) on 07/27/2017 4:12:44 PM   Radiology Ct Abdomen Pelvis W Contrast  Result Date: 07/27/2017 CLINICAL DATA:  Worsening abdominal pain and distension. Constipation. EXAM: CT ABDOMEN AND PELVIS WITH CONTRAST TECHNIQUE: Multidetector CT imaging of the abdomen and pelvis was performed using the standard protocol following bolus administration of intravenous contrast. CONTRAST:  100mL ISOVUE-300 IOPAMIDOL (ISOVUE-300) INJECTION 61%, 30mL OMNIPAQUE IOHEXOL 300 MG/ML SOLN COMPARISON:  01/13/2014 abdominal sonogram. FINDINGS: Lower chest: Small dependent bilateral pleural effusions. Mild dependent atelectasis at both lung bases. Coronary atherosclerosis. Hepatobiliary: Normal liver size. No liver mass. Normal gallbladder with no radiopaque cholelithiasis. No biliary ductal dilatation. Pancreas:  Normal, with no mass or duct dilation. Spleen: Normal size. No mass. Adrenals/Urinary Tract: Normal adrenals. Mild left hydronephrosis. No right hydronephrosis. Ureters are normal caliber. No evidence of obstructing stone or mass in the left urinary tract. Hypodense  1.7 cm renal cortical lesion in the lower left kidney (series 2/image 31). No additional renal lesions. Moderately distended bladder, which otherwise appears normal. Stomach/Bowel: Moderate hiatal hernia. Otherwise normal nondistended stomach. Normal caliber small bowel with no small bowel wall thickening. Normal appendix. Oral contrast transits to the right colon. No large bowel wall thickening, diverticulosis or significant pericolonic fat stranding. Mild stool throughout the large bowel. Vascular/Lymphatic: Atherosclerotic nonaneurysmal abdominal aorta. Patent portal, splenic, hepatic and renal veins. No pathologically enlarged lymph nodes in the abdomen or pelvis. Reproductive: Mildly heterogeneous uterus with scattered uterine calcifications, suggesting small uterine fibroids. No adnexal masses. Other: No pneumoperitoneum, ascites or focal fluid collection. Musculoskeletal: No aggressive appearing focal osseous lesions. Severe L1 vertebral compression fracture of indeterminate chronicity, which appears new since 03/02/2016 chest CT. Marked degenerative disc disease at L2-3. IMPRESSION: 1. No evidence of bowel obstruction or acute bowel inflammation. Mild colonic stool. 2. Mild left hydronephrosis, without appreciable obstructing stone or mass. Moderately distended bladder. Suggest follow-up renal sonogram after bladder voiding to assess for persistent left hydronephrosis. 3. Severe L1 vertebral compression fracture of indeterminate chronicity, which appears new since 03/02/2016 chest CT. 4. Small dependent bilateral pleural effusions. 5. Indeterminate low-attenuation 1.7 cm renal cortical lesion in the lower left kidney, cannot exclude renal cell  carcinoma. Further evaluation with short-term outpatient MRI (preferred) or CT abdomen without and with IV contrast is indicated and may be performed as clinically warranted. 6. Chronic findings include: Aortic Atherosclerosis (ICD10-I70.0). Coronary atherosclerosis. Moderate hiatal hernia. Myomatous uterus. Electronically Signed   By: Delbert Phenix M.D.   On: 07/27/2017 20:13    Procedures .Critical Care Performed by: Linwood Dibbles, MD Authorized by: Linwood Dibbles, MD   Critical care provider statement:    Critical care time (minutes):  45   Critical care was time spent personally by me on the following activities:  Discussions with consultants, evaluation of patient's response to treatment, examination of patient, ordering and performing treatments and interventions, ordering and review of laboratory studies, ordering and review of radiographic studies, pulse oximetry, re-evaluation of patient's condition, obtaining history from patient or surrogate and review of old charts   (including critical care time)  Medications Ordered in ED Medications  sodium chloride 0.9 % bolus 500 mL (0 mLs Intravenous Stopped 07/27/17 1722)    And  0.9 %  sodium chloride infusion ( Intravenous New Bag/Given 07/27/17 1621)  iopamidol (ISOVUE-300) 61 % injection (has no administration in time range)  verapamil (ISOPTIN) injection 2.5 mg (has no administration in time range)  morphine 4 MG/ML injection 4 mg (has no administration in time range)  ondansetron (ZOFRAN) injection 4 mg (4 mg Intravenous Given 07/27/17 1623)  verapamil (ISOPTIN) injection 2.5 mg (2.5 mg Intravenous Given 07/27/17 1623)  iohexol (OMNIPAQUE) 300 MG/ML solution 30 mL (30 mLs Oral Contrast Given 07/27/17 1924)  iopamidol (ISOVUE-300) 61 % injection 100 mL (100 mLs Intravenous Contrast Given 07/27/17 1924)     Initial Impression / Assessment and Plan / ED Course  I have reviewed the triage vital signs and the nursing notes.  Pertinent labs &  imaging results that were available during my care of the patient were reviewed by me and considered in my medical decision making (see chart for details).  Clinical Course as of Jul 28 2107  Mon Jul 27, 2017  1647 Hyponatremia.   UA abnormal, not definitive for UTI   [JK]  2105 Outpatient records from the nursing facility reviewed.  No indication of atrial fibrillation noted  on the records.  Patient did have plain film x-rays earlier this month and no mention of a compression fracture.   [JK]    Clinical Course User Index [JK] Linwood Dibbles, MD    Patient presented to the emergency room with complaints of constipation and abdominal pain.  CT scan shows mild hydronephrosis but no evidence of ureteral stones.  Otherwise no signs of colitis, obstruction or significant constipation.  Patient is noted to have a compression fracture.  This may be contributing to her constipation and ileus that was noted on her outpatient plain films.  Patient continues to have pain and difficulty moving around.  EKG was also noted for atrial fibrillation.  I do not see any prior EKGs documenting atrial fibrillation.  Patient has required rate controlling medications while she is in the emergency room.  Chads2Vasc2 =4.  Anticoagulation should be considered.  Considering her persistent back pain, her atrial fibrillation and hyponatremia I will consult the medical service to see about bringing her in for observation and obtaining cardiology consultation.  Final Clinical Impressions(s) / ED Diagnoses   Final diagnoses:  Closed compression fracture of first lumbar vertebra, initial encounter [facility])  Atrial fibrillation, unspecified type (HCC)  Abdominal pain, unspecified abdominal location  Hyponatremia      Linwood Dibbles, MD 07/27/17 2109 cc addendum   Linwood Dibbles, MD 08/04/17 [id number]

## 2017-07-27 NOTE — H&P (Signed)
History and Physical    Marisa Smith AVW:[id number] DOB: 09-18-1924 DOA: 07/27/2017  PCP: Merlene LaughterStoneking, Hal, MD  Patient coming from: SNF  I have personally briefly Marisa Muttonreviewed patient's old medical records in [facility]  Chief Complaint: Back and abd pain  HPI: Marisa Smith is a 84 y.o. female with medical history significant of osteoporosis, HTN.  Patient presents to the ED with abd pain, back pain.  Symptoms ongoing for several days since a fall at facility.  Not quite clear on the timing of fall but she knows that it was "recent" as in the past week or so.  Looks like they did imaging studies at the facility including Abd x ray and L spine X ray (among others) on 6/15, which demonstrated no acute findings, they stated preservation of vertebral heights noted on that X ray according to read.  We dont have the actual pictures available for comparison today.   ED Course: CT abd / pelvis shows severe L1 compression fx of unclear age but wasn't there Jan 2018, mild L hydronephrosis, a L renal mass that's indeterminate, mild constipation and no evidence of ileus or SBO.   Review of Systems: As per HPI otherwise 10 point review of systems negative.   Past Medical History:  Diagnosis Date  . ALLERGIC RHINITIS CAUSE UNSPECIFIED   . ANXIETY   . Autoimmune hepatitis (HCC)    on MP6, prev pred  . DEPRESSION, MAJOR, MODERATE   . Headache(784.0) 02/15/2010  . HYPERTENSION, BENIGN ESSENTIAL   . HYPOTHYROIDISM   . OSTEOPOROSIS    fx L prox hum and L distal radius, both requiring ORIF 02/2013  . Unspecified vitamin D deficiency     Past Surgical History:  Procedure Laterality Date  . CATARACT EXTRACTION  2008   Left eye  . CATARACT EXTRACTION  2009   Right eye  . HUMERUS IM NAIL Left 02/2013   DR Sol BlazingTEMAN  . HUMERUS IM NAIL Left 02/24/2013   Procedure: INTRAMEDULLARY (IM) NAIL LEFT PROXIMAL  HUMERUS;  Surgeon: Mable ParisJustin William Chandler, MD;  Location: [facility] OR;  Service: Orthopedics;   Laterality: Left;  . OPEN REDUCTION INTERNAL FIXATION (ORIF) DISTAL RADIAL FRACTURE Left 02/16/2013   Procedure: OPEN REDUCTION INTERNAL FIXATION (ORIF) DISTAL RADIAL FRACTURE;  Surgeon: Sharma CovertFred W Ortmann, MD;  Location: [facility] OR;  Service: Orthopedics;  Laterality: Left;  . ORIF RADIAL FRACTURE Right 03/01/2016   Procedure: OPEN REDUCTION INTERNAL FIXATION (ORIF) RADIUS FRACTURE;  Surgeon: Dominica SeverinWilliam Gramig, MD;  Location: [facility] OR;  Service: Orthopedics;  Laterality: Right;  . TONSILLECTOMY  1945     reports that she has never smoked. She has never used smokeless tobacco. She reports that she does not drink alcohol or use drugs.  Allergies  Allergen Reactions  . Amlodipine Besylate     REACTION: itching  . Azatadine     Per MAR?  [name] Kitchen. Azathioprine     REACTION: nausea  . Penicillins     REACTION: yeast infection Per MAR  . Sertraline Hcl     REACTION: rash  . Sulfonamide Derivatives     REACTION: swelling and itching    Family History  Problem Relation Age of Onset  . Hypertension Mother   . Stroke Mother   . Heart disease Father   . Stroke Sister      Prior to Admission medications   Medication Sig Start Date End Date Taking? Authorizing Provider  acetaminophen (TYLENOL) 325 MG tablet Take 325 mg by mouth every 4 (four)  hours as needed for mild pain (right hip pain).   Yes [provider]  acetaminophen (TYLENOL) 500 MG tablet Take 1,000 mg by mouth every 6 (six) hours as needed for mild pain or headache.    Yes [provider]  aspirin EC 81 MG EC tablet Take 81 mg by mouth daily.    Yes [provider]  busPIRone (BUSPAR) 15 MG tablet Take 15 mg by mouth 2 (two) times daily.  12/20/13  Yes [provider]  Calcium Carbonate-Vitamin D 600-200 MG-UNIT CAPS Take 1 capsule by mouth daily.   Yes [provider]  cetirizine (ZYRTEC) 10 MG tablet Take 10 mg by mouth daily.   Yes [provider]  Cholecalciferol (VITAMIN D3) 1000 units  CAPS Take 1,000 Units by mouth daily.    Yes [provider]  Eyelid Cleansers (OCUSOFT LID SCRUB) PADS Place 1 each into both eyes 2 (two) times daily.   Yes [provider]  Glycerin-Hypromellose-PEG 400 0.2-0.36-1 % SOLN Place 1 drop into both eyes 3 (three) times daily.   Yes [provider]  HYDROcodone-acetaminophen (NORCO) 5-325 MG tablet Take 1 tablet by mouth every 6 (six) hours as needed for moderate pain. 03/03/16  Yes Karie Chimera, PA-C  lactulose (CHRONULAC) 10 GM/15ML solution Take 30 mLs by mouth 2 (two) times daily as needed for constipation. 07/24/17  Yes [provider]  levothyroxine (SYNTHROID, LEVOTHROID) 50 MCG tablet Take 1 tablet (50 mcg total) by mouth daily before breakfast. 05/03/13  Yes Newt Lukes, MD  LORazepam (ATIVAN) 0.5 MG tablet Take 0.5 mg by mouth 2 (two) times daily.  12/22/13  Yes [provider]  Menthol, Topical Analgesic, (BIOFREEZE ROLL-ON) 4 % GEL Apply 1 application topically 3 (three) times daily.   Yes [provider]  mercaptopurine (PURINETHOL) 50 MG tablet Take 0.5 tablets (25 mg total) by mouth daily. Give on an empty stomach 1 hour before or 2 hours after meals. Caution: Chemotherapy. 01/05/14  Yes Rachael Fee, MD  mirtazapine (REMERON) 45 MG tablet Take 45 mg by mouth at bedtime.  12/20/13  Yes [provider]  ondansetron (ZOFRAN ODT) 8 MG disintegrating tablet Take 1 tablet (8 mg total) by mouth every 8 (eight) hours as needed for nausea or vomiting. 02/05/16  Yes Azalia Bilis, MD  Propylene Glycol-Glycerin (ARTIFICIAL TEARS) 1-0.3 % SOLN Place 1 drop into both eyes 3 (three) times daily.   Yes [provider]  senna (SENOKOT) 8.6 MG TABS tablet Take 1 tablet by mouth daily.   Yes [provider]  verapamil (VERELAN PM) 120 MG 24 hr capsule Take 120 mg by mouth at bedtime.   Yes [provider]    Physical Exam: Vitals:   07/27/17 1623 07/27/17  1700 07/27/17 1900 07/27/17 2107  BP: (!) 144/73 118/84 123/74 (!) 147/106  Pulse:  (!) 104 (!) 114   Resp:  (!) 24 12   Temp:      TempSrc:      SpO2:  99% 98%     Constitutional: NAD, calm, comfortable Eyes: PERRL, lids and conjunctivae normal ENMT: Mucous membranes are moist. Posterior pharynx clear of any exudate or lesions.Normal dentition.  Neck: normal, supple, no masses, no thyromegaly Respiratory: clear to auscultation bilaterally, no wheezing, no crackles. Normal respiratory effort. No accessory muscle use.  Cardiovascular: Regular rate and rhythm, no murmurs / rubs / gallops. No extremity edema. 2+ pedal pulses. No carotid bruits.  Abdomen: no tenderness, no masses  palpated. No hepatosplenomegaly. Bowel sounds positive.  Musculoskeletal: Severe back pain with movement, not really much point tenderness, no CVA tenderness. Skin: no rashes, lesions, ulcers. No induration Neurologic: CN 2-12 grossly intact. Sensation intact, DTR normal. Strength 5/5 in all 4.  Psychiatric: Normal judgment and insight. Alert and oriented x 3. Normal mood.    Labs on Admission: I have personally reviewed following labs and imaging studies  CBC: Recent Labs  Lab 07/27/17 1546  WBC 8.2  HGB 12.5  HCT 36.5  MCV 98.6  PLT 263   Basic Metabolic Panel: Recent Labs  Lab 07/27/17 1546  NA 129*  K 3.5  CL 91*  CO2 29  GLUCOSE 116*  BUN 13  CREATININE 0.72  CALCIUM 9.3   GFR: CrCl cannot be calculated (Unknown ideal weight.). Liver Function Tests: Recent Labs  Lab 07/27/17 1546  AST 17  ALT 12*  ALKPHOS 122  BILITOT 0.3  PROT 7.1  ALBUMIN 3.2*   Recent Labs  Lab 07/27/17 1546  LIPASE 32   No results for input(s): AMMONIA in the last 168 hours. Coagulation Profile: No results for input(s): INR, PROTIME in the last 168 hours. Cardiac Enzymes: Recent Labs  Lab 07/27/17 1546  TROPONINI <0.03   BNP (last 3 results) No results for input(s): PROBNP in the last 8760  hours. HbA1C: No results for input(s): HGBA1C in the last 72 hours. CBG: No results for input(s): GLUCAP in the last 168 hours. Lipid Profile: No results for input(s): CHOL, HDL, LDLCALC, TRIG, CHOLHDL, LDLDIRECT in the last 72 hours. Thyroid Function Tests: No results for input(s): TSH, T4TOTAL, FREET4, T3FREE, THYROIDAB in the last 72 hours. Anemia Panel: No results for input(s): VITAMINB12, FOLATE, FERRITIN, TIBC, IRON, RETICCTPCT in the last 72 hours. Urine analysis:    Component Value Date/Time   COLORURINE YELLOW 07/27/2017 1546   APPEARANCEUR CLEAR 07/27/2017 1546   LABSPEC 1.005 07/27/2017 1546   PHURINE 7.0 07/27/2017 1546   GLUCOSEU NEGATIVE 07/27/2017 1546   HGBUR NEGATIVE 07/27/2017 1546   BILIRUBINUR NEGATIVE 07/27/2017 1546   KETONESUR NEGATIVE 07/27/2017 1546   PROTEINUR NEGATIVE 07/27/2017 1546   UROBILINOGEN 1.0 02/16/2013 1156   NITRITE NEGATIVE 07/27/2017 1546   LEUKOCYTESUR TRACE (A) 07/27/2017 1546    Radiological Exams on Admission: Ct Abdomen Pelvis W Contrast  Result Date: 07/27/2017 CLINICAL DATA:  Worsening abdominal pain and distension. Constipation. EXAM: CT ABDOMEN AND PELVIS WITH CONTRAST TECHNIQUE: Multidetector CT imaging of the abdomen and pelvis was performed using the standard protocol following bolus administration of intravenous contrast. CONTRAST:  ISOVUE-300 IOPAMIDOL (ISOVUE-300) INJECTION 61%, 30mL OMNIPAQUE IOHEXOL 300 MG/ML SOLN COMPARISON:  01/13/2014 abdominal sonogram. FINDINGS: Lower chest: Small dependent bilateral pleural effusions. Mild dependent atelectasis at both lung bases. Coronary atherosclerosis. Hepatobiliary: Normal liver size. No liver mass. Normal gallbladder with no radiopaque cholelithiasis. No biliary ductal dilatation. Pancreas: Normal, with no mass or duct dilation. Spleen: Normal size. No mass. Adrenals/Urinary Tract: Normal adrenals. Mild left hydronephrosis. No right hydronephrosis. Ureters are normal caliber.  No evidence of obstructing stone or mass in the left urinary tract. Hypodense 1.7 cm renal cortical lesion in the lower left kidney (series 2/image 31). No additional renal lesions. Moderately distended bladder, which otherwise appears normal. Stomach/Bowel: Moderate hiatal hernia. Otherwise normal nondistended stomach. Normal caliber small bowel with no small bowel wall thickening. Normal appendix. Oral contrast transits to the right colon. No large bowel wall thickening, diverticulosis or significant pericolonic fat stranding. Mild stool throughout the large bowel. Vascular/Lymphatic: Atherosclerotic  nonaneurysmal abdominal aorta. Patent portal, splenic, hepatic and renal veins. No pathologically enlarged lymph nodes in the abdomen or pelvis. Reproductive: Mildly heterogeneous uterus with scattered uterine calcifications, suggesting small uterine fibroids. No adnexal masses. Other: No pneumoperitoneum, ascites or focal fluid collection. Musculoskeletal: No aggressive appearing focal osseous lesions. Severe L1 vertebral compression fracture of indeterminate chronicity, which appears new since 03/02/2016 chest CT. Marked degenerative disc disease at L2-3. IMPRESSION: 1. No evidence of bowel obstruction or acute bowel inflammation. Mild colonic stool. 2. Mild left hydronephrosis, without appreciable obstructing stone or mass. Moderately distended bladder. Suggest follow-up renal sonogram after bladder voiding to assess for persistent left hydronephrosis. 3. Severe L1 vertebral compression fracture of indeterminate chronicity, which appears new since 03/02/2016 chest CT. 4. Small dependent bilateral pleural effusions. 5. Indeterminate low-attenuation 1.7 cm renal cortical lesion in the lower left kidney, cannot exclude renal cell carcinoma. Further evaluation with short-term outpatient MRI (preferred) or CT abdomen without and with IV contrast is indicated and may be performed as clinically warranted. 6. Chronic  findings include: Aortic Atherosclerosis (ICD10-I70.0). Coronary atherosclerosis. Moderate hiatal hernia. Myomatous uterus. Electronically Signed   By: Delbert Phenix M.D.   On: 07/27/2017 20:13    EKG: Independently reviewed.  Assessment/Plan Principal Problem:   Closed compression fracture of L1 lumbar vertebra, initial encounter [facility]) Active Problems:   Hypothyroidism   HYPERTENSION, BENIGN ESSENTIAL   Hydronephrosis of left kidney   A-fib (HCC)   Mass of left kidney    1. Closed compression fx of L1 - 1. Appears to be acute: 1. Wasn't there on our imaging Jan 2018 2. From the Reads of X rays including L spine X ray on 07/18/17 from facility, it also wasn't present then (a mere 9 days ago). 2. Morphine PRN pain 3. IR eval in AM 4. Holding ASA 81 5. NPO after MN 2. A.FIB - mild RVR 1. Got verapamil IV in ED 2. Continue home verapamil, will give half dose tonight. 3. Will hold off on starting blood thinners despite CHADS-Vasc of 4: 1. Possible vertebroplasty / kyphoplasty tomorrow 2. Patient seems to be at high fall risk. 4. Follow up cards, defer final decision on anticoagulation to them. 5. Tele monitor 3. Hydronephrosis of left kidney - 1. Mild 2. No SIRS to suggest infection, monitor for development, add ABx and call urology urgently if she develops. 3. BMP appears to be at baseline 4. Follow up imaging / urology as outpt 4. HTN - 1. Continue verapamil 5. Hypothyroid - 1. Continue synthroid 6. Mass of left kidney - 1. consider outpt MRI follow up  DVT prophylaxis: SCDs, possible procedure in AM Code Status: DNR, yellow form at bedside Family Communication: No family in room Disposition Plan: Back to SNF Consults called: None called, IR to eval in AM Admission status: Place in obs   Hillary Bow. DO [facility] Pager [phone] Only works nights!  If 7AM-7PM, please contact the primary day team physician taking care of  patient  [email or web address] Password [facility]  07/27/2017, 10:08 PM

## 2017-07-27 NOTE — ED Notes (Signed)
RN placed pt on purewick to assist in urinary needs

## 2017-07-28 ENCOUNTER — Encounter (HOSPITAL_COMMUNITY): Payer: Self-pay | Admitting: Interventional Radiology

## 2017-07-28 ENCOUNTER — Observation Stay (HOSPITAL_COMMUNITY): Payer: Medicare Other

## 2017-07-28 ENCOUNTER — Other Ambulatory Visit: Payer: Self-pay

## 2017-07-28 DIAGNOSIS — M545 Low back pain: Secondary | ICD-10-CM | POA: Diagnosis not present

## 2017-07-28 DIAGNOSIS — Z7401 Bed confinement status: Secondary | ICD-10-CM | POA: Diagnosis not present

## 2017-07-28 DIAGNOSIS — K5900 Constipation, unspecified: Secondary | ICD-10-CM | POA: Diagnosis present

## 2017-07-28 DIAGNOSIS — Y92099 Unspecified place in other non-institutional residence as the place of occurrence of the external cause: Secondary | ICD-10-CM | POA: Diagnosis not present

## 2017-07-28 DIAGNOSIS — W19XXXA Unspecified fall, initial encounter: Secondary | ICD-10-CM | POA: Diagnosis present

## 2017-07-28 DIAGNOSIS — E039 Hypothyroidism, unspecified: Secondary | ICD-10-CM | POA: Diagnosis present

## 2017-07-28 DIAGNOSIS — Z66 Do not resuscitate: Secondary | ICD-10-CM | POA: Diagnosis present

## 2017-07-28 DIAGNOSIS — E871 Hypo-osmolality and hyponatremia: Secondary | ICD-10-CM

## 2017-07-28 DIAGNOSIS — E876 Hypokalemia: Secondary | ICD-10-CM | POA: Diagnosis present

## 2017-07-28 DIAGNOSIS — S32010A Wedge compression fracture of first lumbar vertebra, initial encounter for closed fracture: Secondary | ICD-10-CM | POA: Diagnosis present

## 2017-07-28 DIAGNOSIS — I4891 Unspecified atrial fibrillation: Secondary | ICD-10-CM | POA: Diagnosis not present

## 2017-07-28 DIAGNOSIS — Z9181 History of falling: Secondary | ICD-10-CM | POA: Diagnosis not present

## 2017-07-28 DIAGNOSIS — K754 Autoimmune hepatitis: Secondary | ICD-10-CM | POA: Diagnosis present

## 2017-07-28 DIAGNOSIS — N132 Hydronephrosis with renal and ureteral calculous obstruction: Secondary | ICD-10-CM | POA: Diagnosis not present

## 2017-07-28 DIAGNOSIS — Z7982 Long term (current) use of aspirin: Secondary | ICD-10-CM | POA: Diagnosis not present

## 2017-07-28 DIAGNOSIS — F329 Major depressive disorder, single episode, unspecified: Secondary | ICD-10-CM | POA: Diagnosis present

## 2017-07-28 DIAGNOSIS — Z8781 Personal history of (healed) traumatic fracture: Secondary | ICD-10-CM | POA: Diagnosis not present

## 2017-07-28 DIAGNOSIS — N2889 Other specified disorders of kidney and ureter: Secondary | ICD-10-CM | POA: Diagnosis present

## 2017-07-28 DIAGNOSIS — E559 Vitamin D deficiency, unspecified: Secondary | ICD-10-CM | POA: Diagnosis present

## 2017-07-28 DIAGNOSIS — I482 Chronic atrial fibrillation: Secondary | ICD-10-CM | POA: Diagnosis present

## 2017-07-28 DIAGNOSIS — N1330 Unspecified hydronephrosis: Secondary | ICD-10-CM | POA: Diagnosis not present

## 2017-07-28 DIAGNOSIS — M810 Age-related osteoporosis without current pathological fracture: Secondary | ICD-10-CM | POA: Diagnosis present

## 2017-07-28 DIAGNOSIS — E860 Dehydration: Secondary | ICD-10-CM | POA: Diagnosis present

## 2017-07-28 DIAGNOSIS — M4856XA Collapsed vertebra, not elsewhere classified, lumbar region, initial encounter for fracture: Secondary | ICD-10-CM | POA: Diagnosis not present

## 2017-07-28 DIAGNOSIS — F419 Anxiety disorder, unspecified: Secondary | ICD-10-CM | POA: Diagnosis present

## 2017-07-28 DIAGNOSIS — E873 Alkalosis: Secondary | ICD-10-CM | POA: Diagnosis present

## 2017-07-28 DIAGNOSIS — M2550 Pain in unspecified joint: Secondary | ICD-10-CM | POA: Diagnosis not present

## 2017-07-28 DIAGNOSIS — I10 Essential (primary) hypertension: Secondary | ICD-10-CM | POA: Diagnosis present

## 2017-07-28 DIAGNOSIS — Z9842 Cataract extraction status, left eye: Secondary | ICD-10-CM | POA: Diagnosis not present

## 2017-07-28 DIAGNOSIS — N200 Calculus of kidney: Secondary | ICD-10-CM | POA: Diagnosis present

## 2017-07-28 DIAGNOSIS — I4510 Unspecified right bundle-branch block: Secondary | ICD-10-CM | POA: Diagnosis present

## 2017-07-28 DIAGNOSIS — Z8249 Family history of ischemic heart disease and other diseases of the circulatory system: Secondary | ICD-10-CM | POA: Diagnosis not present

## 2017-07-28 DIAGNOSIS — S32000A Wedge compression fracture of unspecified lumbar vertebra, initial encounter for closed fracture: Secondary | ICD-10-CM | POA: Diagnosis not present

## 2017-07-28 HISTORY — PX: IR KYPHO LUMBAR INC FX REDUCE BONE BX UNI/BIL CANNULATION INC/IMAGING: IMG5519

## 2017-07-28 LAB — MRSA PCR SCREENING: MRSA by PCR: NEGATIVE

## 2017-07-28 LAB — BASIC METABOLIC PANEL
Anion gap: 6 (ref 5–15)
BUN: 9 mg/dL (ref 8–23)
CALCIUM: 8.4 mg/dL — AB (ref 8.9–10.3)
CHLORIDE: 98 mmol/L (ref 98–111)
CO2: 29 mmol/L (ref 22–32)
CREATININE: 0.66 mg/dL (ref 0.44–1.00)
GFR calc Af Amer: >60 mL/min (ref >60)
GFR calc non Af Amer: >60 mL/min (ref >60)
Glucose, Bld: 108 mg/dL — ABNORMAL HIGH (ref 70–99)
Potassium: 3.2 mmol/L — ABNORMAL LOW (ref 3.5–5.1)
Sodium: 133 mmol/L — ABNORMAL LOW (ref 135–145)

## 2017-07-28 LAB — PROTIME-INR
INR: 1.11
Prothrombin Time: 14.2 seconds (ref 11.4–15.2)

## 2017-07-28 MED ORDER — IBUPROFEN 200 MG PO TABS: 200.0000 mg | ORAL_TABLET | Freq: Three times a day (TID) | ORAL | Status: DC

## 2017-07-28 MED ORDER — VANCOMYCIN HCL IN DEXTROSE 1-5 GM/200ML-% IV SOLN
1000.0000 mg | INTRAVENOUS | Status: AC
  Administered 2017-07-28: 1000 mg via INTRAVENOUS

## 2017-07-28 MED ORDER — IOPAMIDOL (ISOVUE-300) INJECTION 61%
INTRAVENOUS | Status: AC
  Administered 2017-07-28: 50 mL
  Filled 2017-07-28: qty 50

## 2017-07-28 MED ORDER — NALOXONE HCL 0.4 MG/ML IJ SOLN
INTRAMUSCULAR | Status: AC
  Filled 2017-07-28: qty 1

## 2017-07-28 MED ORDER — IBUPROFEN 200 MG PO TABS
200.0000 mg | ORAL_TABLET | Freq: Three times a day (TID) | ORAL | Status: DC
  Administered 2017-07-28 – 2017-07-30 (×7): 200 mg via ORAL
  Filled 2017-07-28 (×7): qty 1

## 2017-07-28 MED ORDER — MIDAZOLAM HCL 2 MG/2ML IJ SOLN
INTRAMUSCULAR | Status: AC | PRN
  Administered 2017-07-28 (×2): 0.5 mg via INTRAVENOUS

## 2017-07-28 MED ORDER — MIDAZOLAM HCL 2 MG/2ML IJ SOLN
INTRAMUSCULAR | Status: AC
  Filled 2017-07-28: qty 2

## 2017-07-28 MED ORDER — POLYVINYL ALCOHOL 1.4 % OP SOLN
1.0000 [drp] | Freq: Three times a day (TID) | OPHTHALMIC | Status: DC
  Administered 2017-07-29 – 2017-07-30 (×4): 1 [drp] via OPHTHALMIC
  Filled 2017-07-28: qty 15

## 2017-07-28 MED ORDER — VERAPAMIL HCL ER 120 MG PO TBCR
60.0000 mg | EXTENDED_RELEASE_TABLET | Freq: Once | ORAL | Status: DC
  Filled 2017-07-28: qty 0.5

## 2017-07-28 MED ORDER — FENTANYL CITRATE (PF) 100 MCG/2ML IJ SOLN
INTRAMUSCULAR | Status: AC
  Filled 2017-07-28: qty 2

## 2017-07-28 MED ORDER — VANCOMYCIN HCL IN DEXTROSE 1-5 GM/200ML-% IV SOLN
INTRAVENOUS | Status: AC
  Filled 2017-07-28: qty 200

## 2017-07-28 MED ORDER — LIDOCAINE HCL (PF) 1 % IJ SOLN
INTRAMUSCULAR | Status: AC
  Filled 2017-07-28: qty 30

## 2017-07-28 MED ORDER — FLUMAZENIL 0.5 MG/5ML IV SOLN
INTRAVENOUS | Status: AC
  Filled 2017-07-28: qty 5

## 2017-07-28 MED ORDER — LIDOCAINE HCL (PF) 1 % IJ SOLN
INTRAMUSCULAR | Status: AC | PRN
  Administered 2017-07-28: 5 mL

## 2017-07-28 MED ORDER — IOPAMIDOL (ISOVUE-300) INJECTION 61%
50.0000 mL | Freq: Once | INTRAVENOUS | Status: AC | PRN
  Administered 2017-07-28: 50 mL

## 2017-07-28 MED ORDER — POTASSIUM CHLORIDE 10 MEQ/100ML IV SOLN
10.0000 meq | INTRAVENOUS | Status: AC
  Administered 2017-07-28 (×4): 10 meq via INTRAVENOUS
  Filled 2017-07-28 (×4): qty 100

## 2017-07-28 MED ORDER — FENTANYL CITRATE (PF) 100 MCG/2ML IJ SOLN
INTRAMUSCULAR | Status: AC | PRN
  Administered 2017-07-28 (×3): 25 ug via INTRAVENOUS

## 2017-07-28 MED ORDER — VERAPAMIL HCL 80 MG PO TABS
80.0000 mg | ORAL_TABLET | Freq: Once | ORAL | Status: AC
  Administered 2017-07-28: 80 mg via ORAL
  Filled 2017-07-28: qty 1

## 2017-07-28 NOTE — Progress Notes (Addendum)
PROGRESS NOTE  Marisa Smith ZOX:[id number] DOB: [birth year] DOA: 07/27/2017 PCP: Merlene LaughterStoneking, Hal, MD   LOS: 0 days   Brief Narrative / Interim history: 84 year old woman with medical history significant for osteoporosis, hypertension, hypothyroidism, and anxiety/depression. Presented to the ED yesterday with abdominal pain and back pain that has been ongoing for a little less than two weeks, per the patient. CT of abdomen and pelvis shows a severe L1 compression fracture of unknown chronicity, as well as an incidental mild left hydronephrosis and a left renal mass.  Na 129, Cl 91, BUN 13, Cr 0.72, GRF >60, UA showed trace leukocytes and rare bacteria, no urinary symptoms, no signs of infection. Patient was tachypneic and tachycardic with no fever or leukocytosis.EKG showed A Fib with mild RVR. She was treated with Verapamil, IVF, and morphine for pain while in the ED then admitted under the working diagnosis of L1 closed compression fracture, atrial fibrillation, and hyponatremia.   Assessment & Plan: Principal Problem:   Closed compression fracture of L1 lumbar vertebra, initial encounter [facility](HCC) Active Problems:   Hypothyroidism   HYPERTENSION, BENIGN ESSENTIAL   Hydronephrosis of left kidney   A-fib (HCC)   Mass of left kidney   Acute, closed compression fracture of L1 -History of osteoporosis -CT scan shows severe L1 compression fracture, appears to be acute and likely result of patient's recent fall -IR to evaluate this morning for possible vertebroplasty/kyphoplasty, continue NPO -Add Ibuprofen 200 mg q 8hrs, IV morphine for severe pain while NPO  A. Fib with RVR -Appears to be new onset, patient denies history of A Fibb, no documentation in the chart -Rate has improved today, 94 bpm this morning -CHADS-Vasc score 4 -Continue home verapamil, hold on anticoagulation until IR evaluates for possible vertebroplasty/kyphoplasty today -Continue telemetry  monitor  Hypokalemia/Hyponatriemia -Potassium 3.2 this am, replete with IV KCl 20 mEq -Na 133 this am, improving, continue IVF   Hydronephrosis of left kidney/renal mass -Incidental finding on CT abdomen/pelvis -BUN 9, Cr 0.66, GRF >60, UA showed trace leukocytes and rare bacteria, no urinary symptoms, no signs of infection -Follow up with outpatient urology  HTN -Continue home verapamil  Hypothyroidism -Continue home synthroid  Depression/Anxiety -Continue home buspar, remeron, ativan  Autoimmune hepatitis -Followed by Dr. Christella HartiganJacobs with [facility] -Continue home mercaptopurine  DVT prophylaxis: SCDs Code Status: DNR Family Communication: No family members at bedside Disposition Plan: TBD, home when ready  Consultants:   IR   Subjective: Patient asleep in bed on arrival and very lethargic, states she did not get much sleep last night. Easily aroused by verbal commands but fell asleep a few times during the exam. She reports pain in her lower back with movement, worse with sitting up in bed. Pain is relieved when laying down with no movement. She states that her left leg feels heavy but attributes that the her SCDs. She denies radiation of pain, paresthesias, saddle anesthesia, loss of bowel or bladder. Denies chest pain, SOB, abdominal pain, nausea.   Objective: Vitals:   07/27/17 2200 07/27/17 2300 07/28/17 0017 07/28/17 0513  BP: 116/67 134/85 (!) 154/86 114/66  Pulse: (!) 124 65 (!) 101 94  Resp: (!) 29 (!) 24 16 20   Temp:   98.2 F (36.8 C) 98 F (36.7 C)  TempSrc:   Oral   SpO2: 95% 95% 99% 96%  Weight:   55 kg (121 lb 4.1 oz)   Height:   5\' 7"  (1.702 m)     Intake/Output Summary (Last  24 hours) at 07/28/2017 1156 Last data filed at 07/28/2017 0200 Gross per 24 hour  Intake 610.2 ml  Output 500 ml  Net 110.2 ml   Filed Weights   07/28/17 0017  Weight: 55 kg (121 lb 4.1 oz)    Examination:  Constitutional: frail elderly woman, very lethargic, falling  asleep during exam Head: Normocephalic, atraumatic Eyes: PERRL, lids and conjunctivae normal Neck: normal, supple Respiratory: clear to auscultation bilaterally, no wheezing, no crackles. Normal respiratory effort. No accessory muscle use.  Cardiovascular: Irregularly irregular rhythm, no appreciable murmurs / rubs / gallops. No LE edema.  Abdomen: soft, not distended, no TTP  Musculoskeletal: Strength 4/5 with L plantar flexion, 4/5 left hip extension, Normal strength on right side. Sensation in tact. Some back pain with movement, however examination was limited due to patient's lethargy at the time of exam. Neurologic: Grossly nonfocal, alert and oriented x 3  Psychiatric: Normal judgment and insight. Normal mood.    Data Reviewed: I have independently reviewed following labs and imaging studies  CBC: Recent Labs  Lab 07/27/17 1546  WBC 8.2  HGB 12.5  HCT 36.5  MCV 98.6  PLT 263   Basic Metabolic Panel: Recent Labs  Lab 07/27/17 1546 07/28/17 0437  NA 129* 133*  K 3.5 3.2*  CL 91* 98  CO2 29 29  GLUCOSE 116* 108*  BUN 13 9  CREATININE 0.72 0.66  CALCIUM 9.3 8.4*   GFR: Estimated Creatinine Clearance: 39.8 mL/min (by C-G formula based on SCr of 0.66 mg/dL). Liver Function Tests: Recent Labs  Lab 07/27/17 1546  AST 17  ALT 12*  ALKPHOS 122  BILITOT 0.3  PROT 7.1  ALBUMIN 3.2*   Recent Labs  Lab 07/27/17 1546  LIPASE 32   No results for input(s): AMMONIA in the last 168 hours. Coagulation Profile: No results for input(s): INR, PROTIME in the last 168 hours. Cardiac Enzymes: Recent Labs  Lab 07/27/17 1546  TROPONINI <0.03   BNP (last 3 results) No results for input(s): PROBNP in the last 8760 hours. HbA1C: No results for input(s): HGBA1C in the last 72 hours. CBG: No results for input(s): GLUCAP in the last 168 hours. Lipid Profile: No results for input(s): CHOL, HDL, LDLCALC, TRIG, CHOLHDL, LDLDIRECT in the last 72 hours. Thyroid Function  Tests: No results for input(s): TSH, T4TOTAL, FREET4, T3FREE, THYROIDAB in the last 72 hours. Anemia Panel: No results for input(s): VITAMINB12, FOLATE, FERRITIN, TIBC, IRON, RETICCTPCT in the last 72 hours. Urine analysis:    Component Value Date/Time   COLORURINE YELLOW 07/27/2017 1546   APPEARANCEUR CLEAR 07/27/2017 1546   LABSPEC 1.005 07/27/2017 1546   PHURINE 7.0 07/27/2017 1546   GLUCOSEU NEGATIVE 07/27/2017 1546   HGBUR NEGATIVE 07/27/2017 1546   BILIRUBINUR NEGATIVE 07/27/2017 1546   KETONESUR NEGATIVE 07/27/2017 1546   PROTEINUR NEGATIVE 07/27/2017 1546   UROBILINOGEN 1.0 02/16/2013 1156   NITRITE NEGATIVE 07/27/2017 1546   LEUKOCYTESUR TRACE (A) 07/27/2017 1546   Sepsis Labs: Invalid input(s): PROCALCITONIN, LACTICIDVEN  Recent Results (from the past 240 hour(s))  MRSA PCR Screening     Status: None   Collection Time: 07/28/17  1:51 AM  Result Value Ref Range Status   MRSA by PCR NEGATIVE NEGATIVE Final    Comment:        The GeneXpert MRSA Assay (FDA approved for NASAL specimens only), is one component of a comprehensive MRSA colonization surveillance program. It is not intended to diagnose MRSA infection nor to guide or monitor treatment  for MRSA infections. Performed at [facility], [address] [place], [place] [address]       Radiology Studies: Ct Abdomen Pelvis W Contrast  Result Date: 07/27/2017 CLINICAL DATA:  Worsening abdominal pain and distension. Constipation. EXAM: CT ABDOMEN AND PELVIS WITH CONTRAST TECHNIQUE: Multidetector CT imaging of the abdomen and pelvis was performed using the standard protocol following bolus administration of intravenous contrast. CONTRAST:  ISOVUE-300 IOPAMIDOL (ISOVUE-300) INJECTION 61%, 30mL OMNIPAQUE IOHEXOL 300 MG/ML SOLN COMPARISON:  01/13/2014 abdominal sonogram. FINDINGS: Lower chest: Small dependent bilateral pleural effusions. Mild dependent atelectasis at both lung bases. Coronary  atherosclerosis. Hepatobiliary: Normal liver size. No liver mass. Normal gallbladder with no radiopaque cholelithiasis. No biliary ductal dilatation. Pancreas: Normal, with no mass or duct dilation. Spleen: Normal size. No mass. Adrenals/Urinary Tract: Normal adrenals. Mild left hydronephrosis. No right hydronephrosis. Ureters are normal caliber. No evidence of obstructing stone or mass in the left urinary tract. Hypodense 1.7 cm renal cortical lesion in the lower left kidney (series 2/image 31). No additional renal lesions. Moderately distended bladder, which otherwise appears normal. Stomach/Bowel: Moderate hiatal hernia. Otherwise normal nondistended stomach. Normal caliber small bowel with no small bowel wall thickening. Normal appendix. Oral contrast transits to the right colon. No large bowel wall thickening, diverticulosis or significant pericolonic fat stranding. Mild stool throughout the large bowel. Vascular/Lymphatic: Atherosclerotic nonaneurysmal abdominal aorta. Patent portal, splenic, hepatic and renal veins. No pathologically enlarged lymph nodes in the abdomen or pelvis. Reproductive: Mildly heterogeneous uterus with scattered uterine calcifications, suggesting small uterine fibroids. No adnexal masses. Other: No pneumoperitoneum, ascites or focal fluid collection. Musculoskeletal: No aggressive appearing focal osseous lesions. Severe L1 vertebral compression fracture of indeterminate chronicity, which appears new since 03/02/2016 chest CT. Marked degenerative disc disease at L2-3. IMPRESSION: 1. No evidence of bowel obstruction or acute bowel inflammation. Mild colonic stool. 2. Mild left hydronephrosis, without appreciable obstructing stone or mass. Moderately distended bladder. Suggest follow-up renal sonogram after bladder voiding to assess for persistent left hydronephrosis. 3. Severe L1 vertebral compression fracture of indeterminate chronicity, which appears new since 03/02/2016 chest CT. 4.  Small dependent bilateral pleural effusions. 5. Indeterminate low-attenuation 1.7 cm renal cortical lesion in the lower left kidney, cannot exclude renal cell carcinoma. Further evaluation with short-term outpatient MRI (preferred) or CT abdomen without and with IV contrast is indicated and may be performed as clinically warranted. 6. Chronic findings include: Aortic Atherosclerosis (ICD10-I70.0). Coronary atherosclerosis. Moderate hiatal hernia. Myomatous uterus. Electronically Signed   By: Delbert Phenix M.D.   On: 07/27/2017 20:13     Scheduled Meds: . busPIRone  15 mg Oral BID  . calcium-vitamin D  1 tablet Oral Q breakfast  . cholecalciferol  1,000 Units Oral Daily  . ibuprofen  200 mg Oral Q8H  . levothyroxine  50 mcg Oral QAC breakfast  . loratadine  10 mg Oral Daily  . LORazepam  0.5 mg Oral BID  . mercaptopurine  25 mg Oral Daily  . mirtazapine  45 mg Oral QHS  . [START ON 07/29/2017] polyvinyl alcohol  1 drop Both Eyes TID  . senna  1 tablet Oral Daily  . verapamil  120 mg Oral QHS   Continuous Infusions: . sodium chloride Stopped (07/28/17 0200)  . sodium chloride 75 mL/hr at 07/28/17 0032  . potassium chloride 10 mEq (07/28/17 1140)     Cindee Lame, PA-S [facility] Pager [phone]  If 7PM-7AM, please contact night-coverage [email or web address] Password [facility] 07/28/2017, 11:56 AM

## 2017-07-28 NOTE — Consult Note (Signed)
Chief Complaint: Patient was seen in consultation today for L1 vertebral augmentation/ kyphoplasty Chief Complaint  Patient presents with  . Abdominal Pain    Referring Physician(s): Arrien, M  Supervising Physician: Oley Balm  Patient Status: Marisa Smith - In-pt  History of Present Illness: Marisa Smith is a 84 y.o. female who presented to ED yesterday with abdominal pain and back pain ongoing for about two weeks. Patient states that she fell at nursing facility 9 days ago. CT abd/pelvis was performed in ED showing severe L1 compression fracture, new since 2018  . Back pain is worse when rolling over and trying to sit up but is relieved by lying still. Denies chest pain, dyspnea, abdominal pain, nausea, vomiting, or diarrhea.  Additional past medical history as listed below.  Request now received from primary care team for consideration of L1 vertebroplasty/kyphoplasty.  Past Medical History:  Diagnosis Date  . ALLERGIC RHINITIS CAUSE UNSPECIFIED   . ANXIETY   . Autoimmune hepatitis (HCC)    on MP6, prev pred  . DEPRESSION, MAJOR, MODERATE   . Headache(784.0) 02/15/2010  . HYPERTENSION, BENIGN ESSENTIAL   . HYPOTHYROIDISM   . OSTEOPOROSIS    fx L prox hum and L distal radius, both requiring ORIF 02/2013  . Unspecified vitamin D deficiency     Past Surgical History:  Procedure Laterality Date  . CATARACT EXTRACTION  2008   Left eye  . CATARACT EXTRACTION  2009   Right eye  . HUMERUS IM NAIL Left 02/2013   DR Sol Blazing  . HUMERUS IM NAIL Left 02/24/2013   Procedure: INTRAMEDULLARY (IM) NAIL LEFT PROXIMAL  HUMERUS;  Surgeon: Mable Paris, MD;  Location: [facility] OR;  Service: Orthopedics;  Laterality: Left;  . OPEN REDUCTION INTERNAL FIXATION (ORIF) DISTAL RADIAL FRACTURE Left 02/16/2013   Procedure: OPEN REDUCTION INTERNAL FIXATION (ORIF) DISTAL RADIAL FRACTURE;  Surgeon: Sharma Covert, MD;  Location: [facility] OR;  Service: Orthopedics;  Laterality: Left;  . ORIF RADIAL  FRACTURE Right 03/01/2016   Procedure: OPEN REDUCTION INTERNAL FIXATION (ORIF) RADIUS FRACTURE;  Surgeon: Dominica Severin, MD;  Location: [facility] OR;  Service: Orthopedics;  Laterality: Right;  . TONSILLECTOMY  1945    Allergies: Amlodipine besylate; Azatadine; Azathioprine; Penicillins; Sertraline hcl; and Sulfonamide derivatives  Medications: Prior to Admission medications   Medication Sig Start Date End Date Taking? Authorizing Provider  acetaminophen (TYLENOL) 325 MG tablet Take 325 mg by mouth every 4 (four) hours as needed for mild pain (right hip pain).   Yes [provider]  acetaminophen (TYLENOL) 500 MG tablet Take 1,000 mg by mouth every 6 (six) hours as needed for mild pain or headache.    Yes [provider]  aspirin EC 81 MG EC tablet Take 81 mg by mouth daily.    Yes [provider]  busPIRone (BUSPAR) 15 MG tablet Take 15 mg by mouth 2 (two) times daily.  12/20/13  Yes [provider]  Calcium Carbonate-Vitamin D 600-200 MG-UNIT CAPS Take 1 capsule by mouth daily.   Yes [provider]  cetirizine (ZYRTEC) 10 MG tablet Take 10 mg by mouth daily.   Yes [provider]  Cholecalciferol (VITAMIN D3) 1000 units CAPS Take 1,000 Units by mouth daily.    Yes [provider]  Eyelid Cleansers (OCUSOFT LID SCRUB) PADS Place 1 each into both eyes 2 (two) times daily.   Yes [provider]  Glycerin-Hypromellose-PEG 400 0.2-0.36-1 % SOLN Place 1 drop into both eyes 3 (three) times daily.  Yes [provider]  HYDROcodone-acetaminophen (NORCO) 5-325 MG tablet Take 1 tablet by mouth every 6 (six) hours as needed for moderate pain. 03/03/16  Yes Karie ChimeraBuchanan, Brian, PA-C  lactulose (CHRONULAC) 10 GM/15ML solution Take 30 mLs by mouth 2 (two) times daily as needed for constipation. 07/24/17  Yes [provider]  levothyroxine (SYNTHROID, LEVOTHROID) 50 MCG tablet Take 1 tablet (50 mcg total) by mouth daily before  breakfast. 05/03/13  Yes Newt LukesLeschber, Valerie A, MD  LORazepam (ATIVAN) 0.5 MG tablet Take 0.5 mg by mouth 2 (two) times daily.  12/22/13  Yes [provider]  Menthol, Topical Analgesic, (BIOFREEZE ROLL-ON) 4 % GEL Apply 1 application topically 3 (three) times daily.   Yes [provider]  mercaptopurine (PURINETHOL) 50 MG tablet Take 0.5 tablets (25 mg total) by mouth daily. Give on an empty stomach 1 hour before or 2 hours after meals. Caution: Chemotherapy. 01/05/14  Yes Rachael FeeJacobs, Daniel P, MD  mirtazapine (REMERON) 45 MG tablet Take 45 mg by mouth at bedtime.  12/20/13  Yes [provider]  ondansetron (ZOFRAN ODT) 8 MG disintegrating tablet Take 1 tablet (8 mg total) by mouth every 8 (eight) hours as needed for nausea or vomiting. 02/05/16  Yes Azalia Bilisampos, Kevin, MD  Propylene Glycol-Glycerin (ARTIFICIAL TEARS) 1-0.3 % SOLN Place 1 drop into both eyes 3 (three) times daily.   Yes [provider]  senna (SENOKOT) 8.6 MG TABS tablet Take 1 tablet by mouth daily.   Yes [provider]  verapamil (VERELAN PM) 120 MG 24 hr capsule Take 120 mg by mouth at bedtime.   Yes [provider]     Family History  Problem Relation Age of Onset  . Hypertension Mother   . Stroke Mother   . Heart disease Father   . Stroke Sister     Social History   Socioeconomic History  . Marital status: Widowed    Spouse name: Not on file  . Number of children: 1  . Years of education: Not on file  . Highest education level: Not on file  Occupational History    Employer: RETIRED  Social Needs  . Financial resource strain: Not on file  . Food insecurity:    Worry: Not on file    Inability: Not on file  . Transportation needs:    Medical: Not on file    Non-medical: Not on file  Tobacco Use  . Smoking status: Never Smoker  . Smokeless tobacco: Never Used  . Tobacco comment: Married, Retired- daughter is [name] Pruden-Fifield responsible for majority of supervison  for parents  Substance and Sexual Activity  . Alcohol use: No    Alcohol/week: 0.0 oz  . Drug use: No  . Sexual activity: Not Currently  Lifestyle  . Physical activity:    Days per week: Not on file    Minutes per session: Not on file  . Stress: Not on file  Relationships  . Social connections:    Talks on phone: Not on file    Gets together: Not on file    Attends religious service: Not on file    Active member of club or organization: Not on file    Attends meetings of clubs or organizations: Not on file    Relationship status: Not on file  Other Topics Concern  . Not on file  Social History Narrative  . Not on file      Review of Systems: Patient denies chest pain, shortness of breath, nausea,  vomiting, diarrhea. Denies numbness or radiation of pain to LE.  Vital Signs: BP 114/66 (BP Location: Left Arm)   Pulse 94   Temp 98 F (36.7 C)   Resp 20   Ht 5\' 7"  (1.702 m)   Wt 121 lb 4.1 oz (55 kg)   SpO2 96%   BMI 18.99 kg/m   Physical Exam: Ill appearing but in no acute distress. Airway grade 1. Lungs clear to auscultation bilat. HR is irregularly irregular, no murmurs. Abdomen is soft, mildly distended, non tender, normoactive bowel sounds. Back pain when rolling to side and with direct palpation to the L1 vertebrae. Alert and oriented x 3, mildly confused but able to answer questions when asked.   Imaging: Ct Abdomen Pelvis W Contrast  Result Date: 07/27/2017 CLINICAL DATA:  Worsening abdominal pain and distension. Constipation. EXAM: CT ABDOMEN AND PELVIS WITH CONTRAST TECHNIQUE: Multidetector CT imaging of the abdomen and pelvis was performed using the standard protocol following bolus administration of intravenous contrast. CONTRAST:  ISOVUE-300 IOPAMIDOL (ISOVUE-300) INJECTION 61%, 30mL OMNIPAQUE IOHEXOL 300 MG/ML SOLN COMPARISON:  01/13/2014 abdominal sonogram. FINDINGS: Lower chest: Small dependent bilateral pleural effusions. Mild dependent atelectasis at  both lung bases. Coronary atherosclerosis. Hepatobiliary: Normal liver size. No liver mass. Normal gallbladder with no radiopaque cholelithiasis. No biliary ductal dilatation. Pancreas: Normal, with no mass or duct dilation. Spleen: Normal size. No mass. Adrenals/Urinary Tract: Normal adrenals. Mild left hydronephrosis. No right hydronephrosis. Ureters are normal caliber. No evidence of obstructing stone or mass in the left urinary tract. Hypodense 1.7 cm renal cortical lesion in the lower left kidney (series 2/image 31). No additional renal lesions. Moderately distended bladder, which otherwise appears normal. Stomach/Bowel: Moderate hiatal hernia. Otherwise normal nondistended stomach. Normal caliber small bowel with no small bowel wall thickening. Normal appendix. Oral contrast transits to the right colon. No large bowel wall thickening, diverticulosis or significant pericolonic fat stranding. Mild stool throughout the large bowel. Vascular/Lymphatic: Atherosclerotic nonaneurysmal abdominal aorta. Patent portal, splenic, hepatic and renal veins. No pathologically enlarged lymph nodes in the abdomen or pelvis. Reproductive: Mildly heterogeneous uterus with scattered uterine calcifications, suggesting small uterine fibroids. No adnexal masses. Other: No pneumoperitoneum, ascites or focal fluid collection. Musculoskeletal: No aggressive appearing focal osseous lesions. Severe L1 vertebral compression fracture of indeterminate chronicity, which appears new since 03/02/2016 chest CT. Marked degenerative disc disease at L2-3. IMPRESSION: 1. No evidence of bowel obstruction or acute bowel inflammation. Mild colonic stool. 2. Mild left hydronephrosis, without appreciable obstructing stone or mass. Moderately distended bladder. Suggest follow-up renal sonogram after bladder voiding to assess for persistent left hydronephrosis. 3. Severe L1 vertebral compression fracture of indeterminate chronicity, which appears new since  03/02/2016 chest CT. 4. Small dependent bilateral pleural effusions. 5. Indeterminate low-attenuation 1.7 cm renal cortical lesion in the lower left kidney, cannot exclude renal cell carcinoma. Further evaluation with short-term outpatient MRI (preferred) or CT abdomen without and with IV contrast is indicated and may be performed as clinically warranted. 6. Chronic findings include: Aortic Atherosclerosis (ICD10-I70.0). Coronary atherosclerosis. Moderate hiatal hernia. Myomatous uterus. Electronically Signed   By: Delbert Phenix M.D.   On: 07/27/2017 20:13    Labs:  CBC: Recent Labs    07/27/17 1546  WBC 8.2  HGB 12.5  HCT 36.5  PLT 263    COAGS: No results for input(s): INR, APTT in the last 8760 hours.  BMP: Recent Labs    07/27/17 1546 07/28/17 0437  NA 129* 133*  K 3.5 3.2*  CL  91* 98  CO2 29 29  GLUCOSE 116* 108*  BUN 13 9  CALCIUM 9.3 8.4*  CREATININE 0.72 0.66  GFRNONAA >60 >60  GFRAA >60 >60    LIVER FUNCTION TESTS: Recent Labs    07/27/17 1546  BILITOT 0.3  AST 17  ALT 12*  ALKPHOS 122  PROT 7.1  ALBUMIN 3.2*    TUMOR MARKERS: No results for input(s): AFPTM, CEA, CA199, CHROMGRNA in the last 8760 hours.  Assessment and Plan: Symptomatic L1 compression fracture following recent fall, Dr. Deanne Coffer has reviewed imaging studies and feels patient is candidate for vertebral body augmentation.  Will perform L1 vertebral augmentation/kyphoplasty this afternoon. Patient should remain NPO other than medications until procedure has been performed. Procedure was explained and risks were reviewed via telephone with daughter [name] who consented via phone for the procedure.  Risks include but not limited to internal bleeding, infection, injury to adjacent structures, inability to completely eradicate back pain.  Janine Ores PA-S  Thank you for this interesting consult.  I greatly enjoyed meeting Marisa Smith and look forward to participating in their care.  A  copy of this report was sent to the requesting provider on this date.  Electronically Signed: D. Jeananne Rama, PA-C 07/28/2017, 2:11 PM   I spent a total of 30 min in face to face in clinical consultation, greater than 50% of which was counseling/coordinating care for Marisa Smith.

## 2017-07-28 NOTE — Progress Notes (Signed)
Initial Nutrition Assessment  DOCUMENTATION CODES:   Not applicable  INTERVENTION:   Ensure Enlive po BID, each supplement provides 350 kcal and 20 grams of protein once diet is advanced.   NUTRITION DIAGNOSIS:   Inadequate oral intake related to decreased appetite as evidenced by per patient/family report.  GOAL:   Patient will meet greater than or equal to 90% of their needs   MONITOR:   Supplement acceptance, Diet advancement, PO intake, Labs, Weight trends, I & O's  REASON FOR ASSESSMENT:   Malnutrition Screening Tool    ASSESSMENT:   Patient with PMH significant for osteoporosis and HTN. Presents this admission with acute closed compression fracture of L1.    Pt reports she eats three meals/day that consist of a meat, vegetable, and grain that the facility provides.  Within the last couple of weeks she has noticed that should could not finish her meals like she usually does (usually <25%). The facility placed her on a liquid diet for the past 8-10 days in attempts to promote a bowel movement. Suspect she has not met her nutrition requirements during this time period. She is currently NPO. Will provide supplements once diet is advanced.   Pt unsure of UBW and unsure if she has lost weight. Records are limited in recent wt history but show pt weighing 104 lb in 2015 and 121 lb this admission. Nutrition-Focused physical exam completed. Suspect some depletion is from advanced age. More evidence will need to be obtained to diagnose malnutrition.   Medications reviewed and include: calcium-vit D Labs reviewed: Na 133 (L) K 3.2 (L)   Diet Order:   Diet Order           Diet NPO time specified Except for: Sips with Meds  Diet effective midnight          EDUCATION NEEDS:   Not appropriate for education at this time  Skin:  Skin Assessment: Reviewed RN Assessment  Last BM:  PTA  Height:   Ht Readings from Last 1 Encounters:  07/28/17 '5\' 7"'$  (1.702 m)    Weight:    Wt Readings from Last 1 Encounters:  07/28/17 121 lb 4.1 oz (55 kg)    Ideal Body Weight:  61.2 kg  BMI:  Body mass index is 18.99 kg/m.  Estimated Nutritional Needs:   Kcal:  1375-1575 kcal  Protein:  65-75 g  Fluid:  >1.3 L/day    Mariana Single RD, LDN Clinical Nutrition Pager # [phone]

## 2017-07-28 NOTE — Procedures (Addendum)
  Procedure: Lumbar L1 kyphoplasty   EBL:   minimal Complications:  none immediate  See full dictation in [facility].  D. Taeden Geller MD Main # [phone] Pager  [phone]    

## 2017-07-28 NOTE — Clinical Social Work Note (Signed)
Clinical Social Work Assessment  Patient Details  Name: Marisa Smith MRN: [id number] Date of Birth: Jan 13, 1925  Date of referral:  07/28/17               Reason for consult:  (admitted from facility)                Permission sought to share information with:  Family Supports Permission granted to share information::  Yes, Verbal Permission Granted  Name::     daughter [name], son Gery PrayBarry  Agency::  [facility]  Relationship::     Contact Information:     Housing/Transportation Living arrangements for the past 2 months:  [facility] Source of Information:  Adult Children, Facility Patient Interpreter Needed:  None Criminal Activity/Legal Involvement Pertinent to Current Situation/Hospitalization:  No - Comment as needed Significant Relationships:  Adult Children, Merchandiser, retailCommunity Support, Friend Lives with:  Facility Resident Do you feel safe going back to the place where you live?  Yes Need for family participation in patient care:  Yes (Comment)(family involved in care decisions, memory impairment)  Care giving concerns:  [facility]  At baseline needs assistance with ambulating and toileting, uses shower chair and walker. Has lived there 4 years and is very pleased with care   Social Worker assessment / plan:  CSW consulted to assist with disposition as pt resides at [facility].  Pt receiving bedside care at time of assessment however daughter participated in assessment- provided care needs above. Spoke with facility as well. Daughter and pt [facility] of [facility].  Will follow for care needs and coordinate return when appropriate.  Employment status:  Retired Financial tradernsurance information:  Managed Medicare(BCBS Medicare) PT Recommendations:  Not assessed at this time Information / Referral to community resources:     Patient/Family's Response to care:  Daughter  appreciative  Patient/Family's Understanding of and Emotional Response to Diagnosis, Current Treatment, and Prognosis:  UTA pt's response. Pt's daughter showed good understanding by asking pertinent questions related to pt's admission and care needs. Emotionally calm but anxious, states she was worried about not having received call from hospital when pt admitted last night. CSW validated her emotions and expressed that communication with care team is a priority in pt's care plan.  Emotional Assessment Appearance:  Appears stated age Attitude/Demeanor/Rapport:  (UTA) Affect (typically observed):  (UTA) Orientation:  Oriented to Self, Oriented to Place, Oriented to  Time, Oriented to Situation Alcohol / Substance use:  Not Applicable Psych involvement (Current and /or in the community):  No (Comment)  Discharge Needs  Concerns to be addressed:  Care Coordination Readmission within the last 30 days:  No Current discharge risk:  None Barriers to Discharge:  Continued Medical Work up   Terex CorporationMeghan R Kindred Heying, LCSW 07/28/2017, 11:53 AM  [phone]

## 2017-07-28 NOTE — Progress Notes (Signed)
PROGRESS NOTE    Marisa Smith Marisa Smith  ZOX:[id number] DOB: 1924/12/28 DOA: 07/27/2017 PCP: Merlene LaughterStoneking, Hal, MD    Brief Narrative:  84 year old female who presented with back pain, she does have the significant past medical history for osteoporosis and hypertension.  Patient reported ongoing back pain for several days since a fall at the nursing facility.  Initial physical examination blood pressure 144/73, heart rate 104, respiratory 24, oxygen saturation 99%.  Moist mucous membranes, lungs clear to auscultation bilaterally, heart S1-S2 present rhythmic, abdomen soft nontender, no lower extremity edema.  Severe back pain with movement, no costovertebral angle tenderness.  Sodium 129, potassium 3.5, chloride 91, bicarb 29, glucose 116, BUN 13, creatinine 0.72, white count 8.2, hemoglobin 12.5, hematocrit 36.5, platelets 163.  Urinalysis specific gravity 1.005, white cell count 6-10.  CT of the abdomen showed mild left hydronephrosis.  Severe L1 vertebral compression fracture, indeterminate low-attenuation 1.7 cm cortical lesion in the left lower kidney.  EKG with atrial fibrillation, right bundle branch block.  Patient was admitted to the hospital with acute L1 vertebral compression fracture, complicated by hyponatremia.  Assessment & Plan:   Principal Problem:   Closed compression fracture of L1 lumbar vertebra, initial encounter [facility](HCC) Active Problems:   Hypothyroidism   HYPERTENSION, BENIGN ESSENTIAL   Hydronephrosis of left kidney   A-fib (HCC)   Mass of left kidney   1.  L1 vertebral compression fracture. Will continue pain control with IV morphine and will add as needed ibuprofen, follow with IR for possibly kyphoplasty. Continue to hold on aspirin for now. Will need physical therapy evaluation.   2. Hyponatremia with contraction alkalosis due to dehydration. Likely due to dehydration, will continue saline IV and will follow on renal panel in am. Renal function preserved with normal cr.     3. Left renal hydronephrosis. Preserved renal function per cr, will continue IV fluids and will follow on renal US.   4. Chronic atrial fibrillation. Continue rate control with verapamil, hold on anticoagulation for now, possible invasive procedure to L1.   5. Hypothyroid. Continue levothyroxine.   6. Depression. Continue mirtazapine, buspar and lorazepam  DVT prophylaxis: scd  Code Status: full  Family Communication: no family at the bedside  Disposition Plan: home    Consultants:   IR  Procedures:     Antimicrobials:       Subjective: Patient complains of pain while moving, improved while not mobile, no nausea or vomiting, no chest pain or dyspnea.   Objective: Vitals:   07/28/17 1615 07/28/17 1620 07/28/17 1625 07/28/17 1630  BP: (!) 162/108 (!) 165/97 (!) 198/107 (!) 154/107  Pulse: (!) 107 (!) 131 (!) 131 (!) 118  Resp: 14 13 11 14   Temp:      TempSrc:      SpO2: 99% 97% 97% 96%  Weight:      Height:        Intake/Output Summary (Last 24 hours) at 07/28/2017 1639 Last data filed at 07/28/2017 0200 Gross per 24 hour  Intake 610.2 ml  Output 500 ml  Net 110.2 ml   Filed Weights   07/28/17 0017  Weight: 55 kg (121 lb 4.1 oz)    Examination:   General: Not in pain or dyspnea, deconditioned and ill looking appearing Neurology: Awake and alert, non focal  E ENT: mild pallor, no icterus, oral mucosa moist Cardiovascular: No JVD. S1-S2 present, rhythmic, no gallops, rubs, or murmurs. No lower extremity edema. Pulmonary: positive breath sounds bilaterally, adequate air movement, no  wheezing, rhonchi or rales. Gastrointestinal. Abdomen flat, no organomegaly, non tender, no rebound or guarding Skin. No rashes Musculoskeletal: no joint deformities     Data Reviewed: I have personally reviewed following labs and imaging studies  CBC: Recent Labs  Lab 07/27/17 1546  WBC 8.2  HGB 12.5  HCT 36.5  MCV 98.6  PLT 263   Basic Metabolic  Panel: Recent Labs  Lab 07/27/17 1546 07/28/17 0437  NA 129* 133*  K 3.5 3.2*  CL 91* 98  CO2 29 29  GLUCOSE 116* 108*  BUN 13 9  CREATININE 0.72 0.66  CALCIUM 9.3 8.4*   GFR: Estimated Creatinine Clearance: 39.8 mL/min (by C-G formula based on SCr of 0.66 mg/dL). Liver Function Tests: Recent Labs  Lab 07/27/17 1546  AST 17  ALT 12*  ALKPHOS 122  BILITOT 0.3  PROT 7.1  ALBUMIN 3.2*   Recent Labs  Lab 07/27/17 1546  LIPASE 32   No results for input(Smith): AMMONIA in the last 168 hours. Coagulation Profile: Recent Labs  Lab 07/28/17 1446  INR 1.11   Cardiac Enzymes: Recent Labs  Lab 07/27/17 1546  TROPONINI <0.03   BNP (last 3 results) No results for input(Smith): PROBNP in the last 8760 hours. HbA1C: No results for input(Smith): HGBA1C in the last 72 hours. CBG: No results for input(Smith): GLUCAP in the last 168 hours. Lipid Profile: No results for input(Smith): CHOL, HDL, LDLCALC, TRIG, CHOLHDL, LDLDIRECT in the last 72 hours. Thyroid Function Tests: No results for input(Smith): TSH, T4TOTAL, FREET4, T3FREE, THYROIDAB in the last 72 hours. Anemia Panel: No results for input(Smith): VITAMINB12, FOLATE, FERRITIN, TIBC, IRON, RETICCTPCT in the last 72 hours.    Radiology Studies: I have reviewed all of the imaging during this hospital visit personally     Scheduled Meds: . busPIRone  15 mg Oral BID  . calcium-vitamin D  1 tablet Oral Q breakfast  . cholecalciferol  1,000 Units Oral Daily  . fentaNYL      . ibuprofen  200 mg Oral Q8H  . levothyroxine  50 mcg Oral QAC breakfast  . loratadine  10 mg Oral Daily  . LORazepam  0.5 mg Oral BID  . mercaptopurine  25 mg Oral Daily  . midazolam      . mirtazapine  45 mg Oral QHS  . [START ON 07/29/2017] polyvinyl alcohol  1 drop Both Eyes TID  . senna  1 tablet Oral Daily  . verapamil  120 mg Oral QHS   Continuous Infusions: . sodium chloride Stopped (07/28/17 0200)  . sodium chloride 75 mL/hr at 07/28/17 0032  .  vancomycin    . vancomycin 1,000 mg (07/28/17 1540)     LOS: 0 days        Dontel Harshberger Annett Gula, MD [facility] Pager [phone]

## 2017-07-29 ENCOUNTER — Inpatient Hospital Stay (HOSPITAL_COMMUNITY): Payer: Medicare Other

## 2017-07-29 LAB — BASIC METABOLIC PANEL
ANION GAP: 8 (ref 5–15)
BUN: 11 mg/dL (ref 8–23)
CO2: 25 mmol/L (ref 22–32)
Calcium: 8.4 mg/dL — ABNORMAL LOW (ref 8.9–10.3)
Chloride: 99 mmol/L (ref 98–111)
Creatinine, Ser: 0.61 mg/dL (ref 0.44–1.00)
GFR calc Af Amer: >60 mL/min (ref >60)
GLUCOSE: 98 mg/dL (ref 70–99)
Potassium: 3.9 mmol/L (ref 3.5–5.1)
Sodium: 132 mmol/L — ABNORMAL LOW (ref 135–145)

## 2017-07-29 LAB — CBC WITH DIFFERENTIAL/PLATELET
BASOS ABS: 0 10*3/uL (ref 0.0–0.1)
Basophils Relative: 0 %
Eosinophils Absolute: 0.4 10*3/uL (ref 0.0–0.7)
Eosinophils Relative: 4 %
HEMATOCRIT: 38.5 % (ref 36.0–46.0)
Hemoglobin: 12.7 g/dL (ref 12.0–15.0)
LYMPHS PCT: 10 %
Lymphs Abs: 0.9 10*3/uL (ref 0.7–4.0)
MCH: 33 pg (ref 26.0–34.0)
MCHC: 33 g/dL (ref 30.0–36.0)
MCV: 100 fL (ref 78.0–100.0)
Monocytes Absolute: 0.9 10*3/uL (ref 0.1–1.0)
Monocytes Relative: 10 %
NEUTROS ABS: 7 10*3/uL (ref 1.7–7.7)
Neutrophils Relative %: 76 %
PLATELETS: 289 10*3/uL (ref 150–400)
RBC: 3.85 MIL/uL — AB (ref 3.87–5.11)
RDW: 13.2 % (ref 11.5–15.5)
WBC: 9.3 10*3/uL (ref 4.0–10.5)

## 2017-07-29 NOTE — Discharge Summary (Signed)
Physician Discharge Summary  Marisa Smith ZOX:[id number] DOB: 01/01/1925 DOA: 07/27/2017  PCP: Merlene Laughter, MD  Admit date: 07/27/2017 Discharge date: 07/29/2017  Admitted From: Home  Disposition:  Per PT recs  Recommendations for Outpatient Follow-up:  1. Follow up with PCP in 1-2 weeks 2. Please obtain BMP/CBC in one week your next doctors visit.    Discharge Condition: Stable CODE STATUS: DNR Diet recommendation: Cardiac  Brief/Interim Summary: 84 year old female with history of osteoporosis essential hypertension came to the hospital with complaints of lower back pain.  Upon admission she was found to have acute L1 vertebral compression fracture complicated by hyponatremia.  Interventional radiology was consulted who performed L1 kyphoplasty on 6/25, patient tolerated this procedure well.  Patient was evaluated by physical therapy during the hospital stay as well. Her hyponatremia improved with hydration. Due to some concerns of hydronephrosis, renal ultrasound was done which showed interval resolution of left-sided hydronephrosis and small nonobstructive renal stone. At this point patient has reached maximum benefit from an hospital stay and stable to be discharged with outpatient follow-up recommendations as stated above.   Discharge Diagnoses:  Principal Problem:   Closed compression fracture of L1 lumbar vertebra, initial encounter [facility] Active Problems:   Hypothyroidism   HYPERTENSION, BENIGN ESSENTIAL   Hydronephrosis of left kidney   A-fib (HCC)   Mass of left kidney   Compression fracture of L1 lumbar vertebra (HCC)  Lower back pain secondary to acute L1 compression fracture Status post L1 kyphoplasty - Status post kyphoplasty by interventional radiology.  Physical therapy evaluation -Pain control  Hyponatremia likely secondary to contraction alkalosis from dehydration - This is improved with IV fluids.  Left-sided hydronephrosis Nonobstructive renal stone -  Repeat ultrasound showed resolution of left-sided hydronephrosis secondary to likely passing of the stone.  Advised to continue oral hydration  Hypothyroidism -Continue Synthroid  Chronic atrial fibrillation -Continue verapamil and resume home medications  History of depression -Continue medications  Discharge Instructions   Allergies as of 07/29/2017      Reactions   Amlodipine Besylate    REACTION: itching   Azatadine    Per MAR?   Azathioprine    REACTION: nausea   Penicillins    REACTION: yeast infection Per MAR   Sertraline Hcl    REACTION: rash   Sulfonamide Derivatives    REACTION: swelling and itching      Medication List    TAKE these medications   acetaminophen 500 MG tablet Commonly known as:  TYLENOL Take 1,000 mg by mouth every 6 (six) hours as needed for mild pain or headache.   acetaminophen 325 MG tablet Commonly known as:  TYLENOL Take 325 mg by mouth every 4 (four) hours as needed for mild pain (right hip pain).   ARTIFICIAL TEARS 1-0.3 % Soln Generic drug:  Propylene Glycol-Glycerin Place 1 drop into both eyes 3 (three) times daily.   aspirin EC 81 MG tablet Take 81 mg by mouth daily.   BIOFREEZE ROLL-ON 4 % Gel Generic drug:  Menthol (Topical Analgesic) Apply 1 application topically 3 (three) times daily.   busPIRone 15 MG tablet Commonly known as:  BUSPAR Take 15 mg by mouth 2 (two) times daily.   Calcium Carbonate-Vitamin D 600-200 MG-UNIT Caps Take 1 capsule by mouth daily.   cetirizine 10 MG tablet Commonly known as:  ZYRTEC Take 10 mg by mouth daily.   Glycerin-Hypromellose-PEG 400 0.2-0.36-1 % Soln Place 1 drop into both eyes 3 (three) times daily.   HYDROcodone-acetaminophen 5-325  MG tablet Commonly known as:  NORCO Take 1 tablet by mouth every 6 (six) hours as needed for moderate pain.   lactulose 10 GM/15ML solution Commonly known as:  CHRONULAC Take 30 mLs by mouth 2 (two) times daily as needed for constipation.    levothyroxine 50 MCG tablet Commonly known as:  SYNTHROID, LEVOTHROID Take 1 tablet (50 mcg total) by mouth daily before breakfast.   LORazepam 0.5 MG tablet Commonly known as:  ATIVAN Take 0.5 mg by mouth 2 (two) times daily.   mercaptopurine 50 MG tablet Commonly known as:  PURINETHOL Take 0.5 tablets (25 mg total) by mouth daily. Give on an empty stomach 1 hour before or 2 hours after meals. Caution: Chemotherapy.   mirtazapine 45 MG tablet Commonly known as:  REMERON Take 45 mg by mouth at bedtime.   OCUSOFT LID SCRUB Pads Place 1 each into both eyes 2 (two) times daily.   ondansetron 8 MG disintegrating tablet Commonly known as:  ZOFRAN ODT Take 1 tablet (8 mg total) by mouth every 8 (eight) hours as needed for nausea or vomiting.   senna 8.6 MG Tabs tablet Commonly known as:  SENOKOT Take 1 tablet by mouth daily.   verapamil 120 MG 24 hr capsule Commonly known as:  VERELAN PM Take 120 mg by mouth at bedtime.   Vitamin D3 1000 units Caps Take 1,000 Units by mouth daily.       Contact information for follow-up providers    Stoneking, Ann Maki, MD. Schedule an appointment as soon as possible for a visit in 1 week(s).   Specialty:  Internal Medicine Contact information: [address] [place] [address] [phone]            Contact information for after-discharge care    Destination    [facility] .   Service:  Skilled Nursing Contact information: [address] [place] [address] [phone]                 Allergies  Allergen Reactions  . Amlodipine Besylate     REACTION: itching  . Azatadine     Per MAR?  [name] Kitchen Azathioprine     REACTION: nausea  . Penicillins     REACTION: yeast infection Per MAR  . Sertraline Hcl     REACTION: rash  . Sulfonamide Derivatives     REACTION: swelling and itching    You were cared for by a hospitalist during your hospital stay. If you have any questions about  your discharge medications or the care you received while you were in the hospital after you are discharged, you can call the unit and asked to speak with the hospitalist on call if the hospitalist that took care of you is not available. Once you are discharged, your primary care physician will handle any further medical issues. Please note that no refills for any discharge medications will be authorized once you are discharged, as it is imperative that you return to your primary care physician (or establish a relationship with a primary care physician if you do not have one) for your aftercare needs so that they can reassess your need for medications and monitor your lab values.  Consultations:  Interventional Radiology    Procedures/Studies: Ct Abdomen Pelvis W Contrast  Result Date: 07/27/2017 CLINICAL DATA:  Worsening abdominal pain and distension. Constipation. EXAM: CT ABDOMEN AND PELVIS WITH CONTRAST TECHNIQUE: Multidetector CT imaging of the abdomen and pelvis was performed using the  standard protocol following bolus administration of intravenous contrast. CONTRAST:  ISOVUE-300 IOPAMIDOL (ISOVUE-300) INJECTION 61%, 30mL OMNIPAQUE IOHEXOL 300 MG/ML SOLN COMPARISON:  01/13/2014 abdominal sonogram. FINDINGS: Lower chest: Small dependent bilateral pleural effusions. Mild dependent atelectasis at both lung bases. Coronary atherosclerosis. Hepatobiliary: Normal liver size. No liver mass. Normal gallbladder with no radiopaque cholelithiasis. No biliary ductal dilatation. Pancreas: Normal, with no mass or duct dilation. Spleen: Normal size. No mass. Adrenals/Urinary Tract: Normal adrenals. Mild left hydronephrosis. No right hydronephrosis. Ureters are normal caliber. No evidence of obstructing stone or mass in the left urinary tract. Hypodense 1.7 cm renal cortical lesion in the lower left kidney (series 2/image 31). No additional renal lesions. Moderately distended bladder, which otherwise appears  normal. Stomach/Bowel: Moderate hiatal hernia. Otherwise normal nondistended stomach. Normal caliber small bowel with no small bowel wall thickening. Normal appendix. Oral contrast transits to the right colon. No large bowel wall thickening, diverticulosis or significant pericolonic fat stranding. Mild stool throughout the large bowel. Vascular/Lymphatic: Atherosclerotic nonaneurysmal abdominal aorta. Patent portal, splenic, hepatic and renal veins. No pathologically enlarged lymph nodes in the abdomen or pelvis. Reproductive: Mildly heterogeneous uterus with scattered uterine calcifications, suggesting small uterine fibroids. No adnexal masses. Other: No pneumoperitoneum, ascites or focal fluid collection. Musculoskeletal: No aggressive appearing focal osseous lesions. Severe L1 vertebral compression fracture of indeterminate chronicity, which appears new since 03/02/2016 chest CT. Marked degenerative disc disease at L2-3. IMPRESSION: 1. No evidence of bowel obstruction or acute bowel inflammation. Mild colonic stool. 2. Mild left hydronephrosis, without appreciable obstructing stone or mass. Moderately distended bladder. Suggest follow-up renal sonogram after bladder voiding to assess for persistent left hydronephrosis. 3. Severe L1 vertebral compression fracture of indeterminate chronicity, which appears new since 03/02/2016 chest CT. 4. Small dependent bilateral pleural effusions. 5. Indeterminate low-attenuation 1.7 cm renal cortical lesion in the lower left kidney, cannot exclude renal cell carcinoma. Further evaluation with short-term outpatient MRI (preferred) or CT abdomen without and with IV contrast is indicated and may be performed as clinically warranted. 6. Chronic findings include: Aortic Atherosclerosis (ICD10-I70.0). Coronary atherosclerosis. Moderate hiatal hernia. Myomatous uterus. Electronically Signed   By: Delbert Phenix M.D.   On: 07/27/2017 20:13   US Renal  Result Date: 07/29/2017 CLINICAL  DATA:  Hydronephrosis EXAM: RENAL / URINARY TRACT ULTRASOUND COMPLETE COMPARISON:  CT abdomen pelvis 07/27/2017 FINDINGS: Right Kidney: Length: 9.0 cm. Echogenicity within normal limits. No mass or hydronephrosis visualized. Left Kidney: Length: 10.7 cm. Interval resolution of left hydronephrosis. With the recent CT. 4 x 6 mm nonobstructing stone left lower pole. Small cyst left lower pole not visualized by ultrasound but is identified by CT. Bladder: Bilateral ureteral jets noted. Debris within the urinary bladder. No mass lesion. IMPRESSION: Interval resolution of left hydronephrosis. 4 x 6 mm nonobstructing stone left lower pole. Left lower pole cyst not identified by ultrasound Bilateral ureteral jets identified.  Question recently passed stone. Electronically Signed   By: Marlan Palau M.D.   On: 07/29/2017 11:30   Ir Kypho Lumbar Inc Fx Reduce Bone Bx Uni/bil Cannulation Inc/imaging  Result Date: 07/28/2017 CLINICAL DATA:  Subacute painful L1 compression fracture deformity. EXAM: KYPHOPLASTY AT LUMBAR L1 TECHNIQUE: The procedure, risks (including but not limited to bleeding, infection, organ damage), benefits, and alternatives were explained to the patient and daughter. Questions regarding the procedure were encouraged and answered. The patient understands and consents to the procedure. The patient was placed prone on the fluoroscopic table. The skin overlying the upper lumbar region was then  prepped and draped in the usual sterile fashion. Maximal barrier sterile technique was utilized including caps, mask, sterile gowns, sterile gloves, sterile drape, hand hygiene and skin antiseptic. Intravenous Fentanyl and Versed were administered as conscious sedation during continuous cardiorespiratory monitoring by the radiology RN, with a total moderate sedation time of28 minutes. As antibiotic prophylaxis, vancomycin 1 g was ordered pre-procedure and administered intravenously within !one hour! of incision. The  right pedicle at lumbar L1 was then infiltrated with 1% lidocaine followed by the advancement of a Kyphon trocar needle through the right pedicle into the posterior one-third. The trocar was removed and the osteo drill was advanced to the anterior third of the vertebral body. The osteo drill was retracted. Through the working cannula, a Kyphon inflatable bone tamp 15 x 2 was advanced and positioned with the distal marker 5 mm from the anterior aspect of the cortex. Crossing of the midline was seen on the AP projection. At this time, the balloon was expanded using contrast via a Kyphon inflation syringe device via micro tubing. Inflation continued until there was near apposition across the midline and with the superior and the inferior end plates. At this time, methylmethacrylate mixture was reconstituted in the Kyphon bone mixing device system. This was then loaded into the delivery mechanism, attached to Kyphon bone fillers. The balloon was deflated and removed followed by the instillation of methylmethacrylate mixture with excellent filling in the AP and lateral projections. No extravasation was noted in the disk spaces or posteriorly into the spinal canal. No epidural venous contamination was seen. The patient tolerated the procedure well. There were no acute complications. The working cannulae and the bone filler were then retrieved and removed. COMPLICATIONS: COMPLICATIONS None immediate. IMPRESSION: 1. Status post vertebral body augmentation using balloon kyphoplasty at lumbar L1 as described without event. 2. Per CMS PQRS reporting requirements (PQRS Measure 24): Given the patient's age of greater than 50 and the fracture site (hip, distal radius, or spine), the patient should be tested for osteoporosis using DXA, and the appropriate treatment considered based on the DXA results. Electronically Signed   By: Corlis Leak M.D.   On: 07/28/2017 17:01     Subjective: No complaints, feeling ok.   General = no  fevers, chills, dizziness, malaise, fatigue HEENT/EYES = negative for pain, redness, loss of vision, double vision, blurred vision, loss of hearing, sore throat, hoarseness, dysphagia Cardiovascular= negative for chest pain, palpitation, murmurs, lower extremity swelling Respiratory/lungs= negative for shortness of breath, cough, hemoptysis, wheezing, mucus production Gastrointestinal= negative for nausea, vomiting,, abdominal pain, melena, hematemesis Genitourinary= negative for Dysuria, Hematuria, Change in Urinary Frequency MSK = Negative for arthralgia, myalgias, Back Pain, Joint swelling  Neurology= Negative for headache, seizures, numbness, tingling  Psychiatry= Negative for anxiety, depression, suicidal and homocidal ideation Allergy/Immunology= Medication/Food allergy as listed  Skin= Negative for Rash, lesions, ulcers, itching  Discharge Exam: Vitals:   07/28/17 1959 07/29/17 0512  BP: (!) 147/100 125/84  Pulse: 86 80  Resp: 18 18  Temp: 97.9 F (36.6 C) 98.7 F (37.1 C)  SpO2: 98% 94%   Vitals:   07/28/17 1823 07/28/17 1914 07/28/17 1959 07/29/17 0512  BP: (!) 145/76 (!) 141/95 (!) 147/100 125/84  Pulse: 70 72 86 80  Resp: 16  18 18   Temp: 97.6 F (36.4 C) 98.2 F (36.8 C) 97.9 F (36.6 C) 98.7 F (37.1 C)  TempSrc: Oral Oral Oral   SpO2: 97% 98% 98% 94%  Weight:      Height:  General: Pt is alert, awake, not in acute distress; elderly frail appearing.  Cardiovascular: RRR, S1/S2 +, no rubs, no gallops Respiratory: CTA bilaterally, no wheezing, no rhonchi Abdominal: Soft, NT, ND, bowel sounds + Extremities: no edema, no cyanosis  The results of significant diagnostics from this hospitalization (including imaging, microbiology, ancillary and laboratory) are listed below for reference.     Microbiology: Recent Results (from the past 240 hour(s))  MRSA PCR Screening     Status: None   Collection Time: 07/28/17  1:51 AM  Result Value Ref Range Status    MRSA by PCR NEGATIVE NEGATIVE Final    Comment:        The GeneXpert MRSA Assay (FDA approved for NASAL specimens only), is one component of a comprehensive MRSA colonization surveillance program. It is not intended to diagnose MRSA infection nor to guide or monitor treatment for MRSA infections. Performed at [facility], [address] [place], [place] [id number]      Labs: BNP (last 3 results) No results for input(s): BNP in the last 8760 hours. Basic Metabolic Panel: Recent Labs  Lab 07/27/17 1546 07/28/17 0437 07/29/17 0453  NA 129* 133* 132*  K 3.5 3.2* 3.9  CL 91* 98 99  CO2 29 29 25   GLUCOSE 116* 108* 98  BUN 13 9 11   CREATININE 0.72 0.66 0.61  CALCIUM 9.3 8.4* 8.4*   Liver Function Tests: Recent Labs  Lab 07/27/17 1546  AST 17  ALT 12*  ALKPHOS 122  BILITOT 0.3  PROT 7.1  ALBUMIN 3.2*   Recent Labs  Lab 07/27/17 1546  LIPASE 32   No results for input(s): AMMONIA in the last 168 hours. CBC: Recent Labs  Lab 07/27/17 1546 07/29/17 0453  WBC 8.2 9.3  NEUTROABS  --  7.0  HGB 12.5 12.7  HCT 36.5 38.5  MCV 98.6 100.0  PLT 263 289   Cardiac Enzymes: Recent Labs  Lab 07/27/17 1546  TROPONINI <0.03   BNP: Invalid input(s): POCBNP CBG: No results for input(s): GLUCAP in the last 168 hours. D-Dimer No results for input(s): DDIMER in the last 72 hours. Hgb A1c No results for input(s): HGBA1C in the last 72 hours. Lipid Profile No results for input(s): CHOL, HDL, LDLCALC, TRIG, CHOLHDL, LDLDIRECT in the last 72 hours. Thyroid function studies No results for input(s): TSH, T4TOTAL, T3FREE, THYROIDAB in the last 72 hours.  Invalid input(s): FREET3 Anemia work up No results for input(s): VITAMINB12, FOLATE, FERRITIN, TIBC, IRON, RETICCTPCT in the last 72 hours. Urinalysis    Component Value Date/Time   COLORURINE YELLOW 07/27/2017 1546   APPEARANCEUR CLEAR 07/27/2017 1546   LABSPEC 1.005 07/27/2017 1546   PHURINE  7.0 07/27/2017 1546   GLUCOSEU NEGATIVE 07/27/2017 1546   HGBUR NEGATIVE 07/27/2017 1546   BILIRUBINUR NEGATIVE 07/27/2017 1546   KETONESUR NEGATIVE 07/27/2017 1546   PROTEINUR NEGATIVE 07/27/2017 1546   UROBILINOGEN 1.0 02/16/2013 1156   NITRITE NEGATIVE 07/27/2017 1546   LEUKOCYTESUR TRACE (A) 07/27/2017 1546   Sepsis Labs Invalid input(s): PROCALCITONIN,  WBC,  LACTICIDVEN Microbiology Recent Results (from the past 240 hour(s))  MRSA PCR Screening     Status: None   Collection Time: 07/28/17  1:51 AM  Result Value Ref Range Status   MRSA by PCR NEGATIVE NEGATIVE Final    Comment:        The GeneXpert MRSA Assay (FDA approved for NASAL specimens only), is one component of a comprehensive MRSA colonization surveillance program. It is not  intended to diagnose MRSA infection nor to guide or monitor treatment for MRSA infections. Performed at [facility], [address] [place], [place] [address]      Time coordinating discharge:  I have spent 35 minutes face to face with the patient and on the ward discussing the patients care, assessment, plan and disposition with other care givers. >50% of the time was devoted counseling the patient about the risks and benefits of treatment/Discharge disposition and coordinating care.   SIGNED:   Dimple Nanas, MD  [facility] 07/29/2017, 12:21 PM Pager   If 7PM-7AM, please contact night-coverage [email or web address] Password [email or web address]

## 2017-07-29 NOTE — Plan of Care (Signed)
  Problem: Activity: Goal: Risk for activity intolerance will decrease Outcome: Progressing   Problem: Nutrition: Goal: Adequate nutrition will be maintained Outcome: Progressing   Problem: Coping: Goal: Level of anxiety will decrease Outcome: Progressing   Problem: Elimination: Goal: Will not experience complications related to urinary retention Outcome: Progressing   Problem: Pain Managment: Goal: General experience of comfort will improve Outcome: Progressing   Problem: Safety: Goal: Ability to remain free from injury will improve Outcome: Progressing   Problem: Skin Integrity: Goal: Risk for impaired skin integrity will decrease Outcome: Progressing   

## 2017-07-29 NOTE — Progress Notes (Signed)
Referring Physician(s): Dr. Ella Jubilee  Supervising Physician: Richarda Overlie  Patient Status:  Marisa Smith - In-pt  Chief Complaint: Back pain  Subjective: Sitting up in chair, eating a liquid lunch at time of visit.   Allergies: Amlodipine besylate; Azatadine; Azathioprine; Penicillins; Sertraline hcl; and Sulfonamide derivatives  Medications: Prior to Admission medications   Medication Sig Start Date End Date Taking? Authorizing Provider  acetaminophen (TYLENOL) 325 MG tablet Take 325 mg by mouth every 4 (four) hours as needed for mild pain (right hip pain).   Yes [provider]  acetaminophen (TYLENOL) 500 MG tablet Take 1,000 mg by mouth every 6 (six) hours as needed for mild pain or headache.    Yes [provider]  aspirin EC 81 MG EC tablet Take 81 mg by mouth daily.    Yes [provider]  busPIRone (BUSPAR) 15 MG tablet Take 15 mg by mouth 2 (two) times daily.  12/20/13  Yes [provider]  Calcium Carbonate-Vitamin D 600-200 MG-UNIT CAPS Take 1 capsule by mouth daily.   Yes [provider]  cetirizine (ZYRTEC) 10 MG tablet Take 10 mg by mouth daily.   Yes [provider]  Cholecalciferol (VITAMIN D3) 1000 units CAPS Take 1,000 Units by mouth daily.    Yes [provider]  Eyelid Cleansers (OCUSOFT LID SCRUB) PADS Place 1 each into both eyes 2 (two) times daily.   Yes [provider]  Glycerin-Hypromellose-PEG 400 0.2-0.36-1 % SOLN Place 1 drop into both eyes 3 (three) times daily.   Yes [provider]  HYDROcodone-acetaminophen (NORCO) 5-325 MG tablet Take 1 tablet by mouth every 6 (six) hours as needed for moderate pain. 03/03/16  Yes Karie Chimera, PA-C  lactulose (CHRONULAC) 10 GM/15ML solution Take 30 mLs by mouth 2 (two) times daily as needed for constipation. 07/24/17  Yes [provider]  levothyroxine (SYNTHROID, LEVOTHROID) 50 MCG tablet Take 1 tablet (50 mcg total) by mouth daily  before breakfast. 05/03/13  Yes Newt Lukes, MD  LORazepam (ATIVAN) 0.5 MG tablet Take 0.5 mg by mouth 2 (two) times daily.  12/22/13  Yes [provider]  Menthol, Topical Analgesic, (BIOFREEZE ROLL-ON) 4 % GEL Apply 1 application topically 3 (three) times daily.   Yes [provider]  mercaptopurine (PURINETHOL) 50 MG tablet Take 0.5 tablets (25 mg total) by mouth daily. Give on an empty stomach 1 hour before or 2 hours after meals. Caution: Chemotherapy. 01/05/14  Yes Rachael Fee, MD  mirtazapine (REMERON) 45 MG tablet Take 45 mg by mouth at bedtime.  12/20/13  Yes [provider]  ondansetron (ZOFRAN ODT) 8 MG disintegrating tablet Take 1 tablet (8 mg total) by mouth every 8 (eight) hours as needed for nausea or vomiting. 02/05/16  Yes Azalia Bilis, MD  Propylene Glycol-Glycerin (ARTIFICIAL TEARS) 1-0.3 % SOLN Place 1 drop into both eyes 3 (three) times daily.   Yes [provider]  senna (SENOKOT) 8.6 MG TABS tablet Take 1 tablet by mouth daily.   Yes [provider]  verapamil (VERELAN PM) 120 MG 24 hr capsule Take 120 mg by mouth at bedtime.   Yes [provider]     Vital Signs: BP 135/68 (BP Location: Left Arm)   Pulse 79   Temp 97.8 F (36.6 C) (Oral)   Resp 16   Ht 5\' 7"  (1.702 m)   Wt 121 lb 4.1 oz (55 kg)   SpO2 98%   BMI 18.99 kg/m  Physical Exam  NAD, alert, communicative Back:  Puncture site c/d/i.  Dressing replaced. No point tenderness at bony prominences.  MSK:  Improvement in ROM as patient able to sit up and lean without difficulty for examiner.   Imaging: Ct Abdomen Pelvis W Contrast  Result Date: 07/27/2017 CLINICAL DATA:  Worsening abdominal pain and distension. Constipation. EXAM: CT ABDOMEN AND PELVIS WITH CONTRAST TECHNIQUE: Multidetector CT imaging of the abdomen and pelvis was performed using the standard protocol following bolus administration of intravenous contrast. CONTRAST:   ISOVUE-300 IOPAMIDOL (ISOVUE-300) INJECTION 61%, 30mL OMNIPAQUE IOHEXOL 300 MG/ML SOLN COMPARISON:  01/13/2014 abdominal sonogram. FINDINGS: Lower chest: Small dependent bilateral pleural effusions. Mild dependent atelectasis at both lung bases. Coronary atherosclerosis. Hepatobiliary: Normal liver size. No liver mass. Normal gallbladder with no radiopaque cholelithiasis. No biliary ductal dilatation. Pancreas: Normal, with no mass or duct dilation. Spleen: Normal size. No mass. Adrenals/Urinary Tract: Normal adrenals. Mild left hydronephrosis. No right hydronephrosis. Ureters are normal caliber. No evidence of obstructing stone or mass in the left urinary tract. Hypodense 1.7 cm renal cortical lesion in the lower left kidney (series 2/image 31). No additional renal lesions. Moderately distended bladder, which otherwise appears normal. Stomach/Bowel: Moderate hiatal hernia. Otherwise normal nondistended stomach. Normal caliber small bowel with no small bowel wall thickening. Normal appendix. Oral contrast transits to the right colon. No large bowel wall thickening, diverticulosis or significant pericolonic fat stranding. Mild stool throughout the large bowel. Vascular/Lymphatic: Atherosclerotic nonaneurysmal abdominal aorta. Patent portal, splenic, hepatic and renal veins. No pathologically enlarged lymph nodes in the abdomen or pelvis. Reproductive: Mildly heterogeneous uterus with scattered uterine calcifications, suggesting small uterine fibroids. No adnexal masses. Other: No pneumoperitoneum, ascites or focal fluid collection. Musculoskeletal: No aggressive appearing focal osseous lesions. Severe L1 vertebral compression fracture of indeterminate chronicity, which appears new since 03/02/2016 chest CT. Marked degenerative disc disease at L2-3. IMPRESSION: 1. No evidence of bowel obstruction or acute bowel inflammation. Mild colonic stool. 2. Mild left hydronephrosis, without appreciable obstructing stone or  mass. Moderately distended bladder. Suggest follow-up renal sonogram after bladder voiding to assess for persistent left hydronephrosis. 3. Severe L1 vertebral compression fracture of indeterminate chronicity, which appears new since 03/02/2016 chest CT. 4. Small dependent bilateral pleural effusions. 5. Indeterminate low-attenuation 1.7 cm renal cortical lesion in the lower left kidney, cannot exclude renal cell carcinoma. Further evaluation with short-term outpatient MRI (preferred) or CT abdomen without and with IV contrast is indicated and may be performed as clinically warranted. 6. Chronic findings include: Aortic Atherosclerosis (ICD10-I70.0). Coronary atherosclerosis. Moderate hiatal hernia. Myomatous uterus. Electronically Signed   By: Delbert Phenix M.D.   On: 07/27/2017 20:13   US Renal  Result Date: 07/29/2017 CLINICAL DATA:  Hydronephrosis EXAM: RENAL / URINARY TRACT ULTRASOUND COMPLETE COMPARISON:  CT abdomen pelvis 07/27/2017 FINDINGS: Right Kidney: Length: 9.0 cm. Echogenicity within normal limits. No mass or hydronephrosis visualized. Left Kidney: Length: 10.7 cm. Interval resolution of left hydronephrosis. With the recent CT. 4 x 6 mm nonobstructing stone left lower pole. Small cyst left lower pole not visualized by ultrasound but is identified by CT. Bladder: Bilateral ureteral jets noted. Debris within the urinary bladder. No mass lesion. IMPRESSION: Interval resolution of left hydronephrosis. 4 x 6 mm nonobstructing stone left lower pole. Left lower pole cyst not identified by ultrasound Bilateral ureteral jets identified.  Question recently passed stone. Electronically Signed   By: Marlan Palau M.D.   On: 07/29/2017 11:30   Ir Kypho Lumbar Inc Fx Reduce Bone  Bx Uni/bil Cannulation Inc/imaging  Result Date: 07/28/2017 CLINICAL DATA:  Subacute painful L1 compression fracture deformity. EXAM: KYPHOPLASTY AT LUMBAR L1 TECHNIQUE: The procedure, risks (including but not limited to bleeding,  infection, organ damage), benefits, and alternatives were explained to the patient and daughter. Questions regarding the procedure were encouraged and answered. The patient understands and consents to the procedure. The patient was placed prone on the fluoroscopic table. The skin overlying the upper lumbar region was then prepped and draped in the usual sterile fashion. Maximal barrier sterile technique was utilized including caps, mask, sterile gowns, sterile gloves, sterile drape, hand hygiene and skin antiseptic. Intravenous Fentanyl and Versed were administered as conscious sedation during continuous cardiorespiratory monitoring by the radiology RN, with a total moderate sedation time of28 minutes. As antibiotic prophylaxis, vancomycin 1 g was ordered pre-procedure and administered intravenously within !one hour! of incision. The right pedicle at lumbar L1 was then infiltrated with 1% lidocaine followed by the advancement of a Kyphon trocar needle through the right pedicle into the posterior one-third. The trocar was removed and the osteo drill was advanced to the anterior third of the vertebral body. The osteo drill was retracted. Through the working cannula, a Kyphon inflatable bone tamp 15 x 2 was advanced and positioned with the distal marker 5 mm from the anterior aspect of the cortex. Crossing of the midline was seen on the AP projection. At this time, the balloon was expanded using contrast via a Kyphon inflation syringe device via micro tubing. Inflation continued until there was near apposition across the midline and with the superior and the inferior end plates. At this time, methylmethacrylate mixture was reconstituted in the Kyphon bone mixing device system. This was then loaded into the delivery mechanism, attached to Kyphon bone fillers. The balloon was deflated and removed followed by the instillation of methylmethacrylate mixture with excellent filling in the AP and lateral projections. No  extravasation was noted in the disk spaces or posteriorly into the spinal canal. No epidural venous contamination was seen. The patient tolerated the procedure well. There were no acute complications. The working cannulae and the bone filler were then retrieved and removed. COMPLICATIONS: COMPLICATIONS None immediate. IMPRESSION: 1. Status post vertebral body augmentation using balloon kyphoplasty at lumbar L1 as described without event. 2. Per CMS PQRS reporting requirements (PQRS Measure 24): Given the patient's age of greater than 50 and the fracture site (hip, distal radius, or spine), the patient should be tested for osteoporosis using DXA, and the appropriate treatment considered based on the DXA results. Electronically Signed   By: Corlis Leak M.D.   On: 07/28/2017 17:01    Labs:  CBC: Recent Labs    07/27/17 1546 07/29/17 0453  WBC 8.2 9.3  HGB 12.5 12.7  HCT 36.5 38.5  PLT 263 289    COAGS: Recent Labs    07/28/17 1446  INR 1.11    BMP: Recent Labs    07/27/17 1546 07/28/17 0437 07/29/17 0453  NA 129* 133* 132*  K 3.5 3.2* 3.9  CL 91* 98 99  CO2 29 29 25   GLUCOSE 116* 108* 98  BUN 13 9 11   CALCIUM 9.3 8.4* 8.4*  CREATININE 0.72 0.66 0.61  GFRNONAA >60 >60 >60  GFRAA >60 >60 >60    LIVER FUNCTION TESTS: Recent Labs    07/27/17 1546  BILITOT 0.3  AST 17  ALT 12*  ALKPHOS 122  PROT 7.1  ALBUMIN 3.2*    Assessment and Plan: L1 compression  fracture s/p kyphoplasty 07/29/17.  Patient still complains of back pain today, although she does admit to a small improvement since procedure yesterday.  Procedure site clean and dry. No erythema or warmth.   Electronically Signed: Hoyt KochKacie Sue-Ellen Kensly Bowmer, PA 07/29/2017, 3:35 PM   I spent a total of 15 Minutes at the the patient's bedside AND on the patient's [facility] floor or unit, greater than 50% of which was counseling/coordinating care for L1 compression fracture.

## 2017-07-29 NOTE — Evaluation (Signed)
Occupational Therapy Evaluation Patient Details Name: Marisa Smith MRN: [id number] DOB: [birth year] Today's Date: 07/29/2017    History of Present Illness 84 yo female admitted with L1 compression fx. S/P kyphoplasty 07/28/17   Clinical Impression   Pt was admitted for the above.  She resides at [facility].  Per chart, they helped with adls; pt states she was able to get herself dressed. Will follow in acute with min guard to min A level goals.  Pt needs up to max A for adls at this time    Follow Up Recommendations  SNF    Equipment Recommendations  3 in 1 bedside commode    Recommendations for Other Services       Precautions / Restrictions Precautions Precautions: Fall Restrictions Weight Bearing Restrictions: No      Mobility Bed Mobility Overal bed mobility: Needs Assistance Bed Mobility: Supine to Sit     Supine to sit: Mod assist;HOB elevated     General bed mobility comments: Assist for trunk and LEs. Pt relied on bedrail. Increased time. Cues for safety, technique.   Transfers Overall transfer level: Needs assistance Equipment used: Rolling walker (2 wheeled) Transfers: Sit to/from [employer] CorporationStand;Stand Pivot Transfers Sit to Stand: Min assist;From elevated surface Stand pivot transfers: Min assist       General transfer comment: Assist to rise, stabilize, control descent. VCs safety, technique, hand placement. Stand pivot with RW    Balance Overall balance assessment: Needs assistance         Standing balance support: Bilateral upper extremity supported Standing balance-Leahy Scale: Poor                             ADL either performed or assessed with clinical judgement   ADL Overall ADL's : Needs assistance/impaired Eating/Feeding: Independent   Grooming: Set up;Sitting   Upper Body Bathing: Minimal assistance;Sitting   Lower Body Bathing: Maximal assistance;Sit to/from stand   Upper Body Dressing : Moderate  assistance;Sitting   Lower Body Dressing: Maximal assistance;Sit to/from stand   Toilet Transfer: Minimal assistance;Stand-pivot;RW(to chair)   Toileting- Clothing Manipulation and Hygiene: Moderate assistance;Sit to/from stand         General ADL Comments: pt's back hurts with bending.  Would benefit from AE.       Vision         Perception     Praxis      Pertinent Vitals/Pain Pain Assessment: Faces Faces Pain Scale: Hurts even more Pain Location: back with mobility Pain Descriptors / Indicators: Aching;Sore;Grimacing Pain Intervention(s): Monitored during session;Repositioned     Hand Dominance     Extremity/Trunk Assessment Upper Extremity Assessment Upper Extremity Assessment: Generalized weakness      Cervical / Trunk Assessment Cervical / Trunk Assessment: Kyphotic   Communication Communication Communication: HOH   Cognition Arousal/Alertness: Awake/alert Behavior During Therapy: WFL for tasks assessed/performed Overall Cognitive Status: Within Functional Limits for tasks assessed                                     General Comments       Exercises     Shoulder Instructions      Home Living Family/patient expects to be discharged to:: Unsure     Type of Home: Assisted living  Home Equipment: Walker - 2 wheels          Prior Functioning/Environment Level of Independence: Needs assistance  Gait / Transfers Assistance Needed: walker for ambulation  ADL's / Homemaking Assistance Needed: assist for bathing, dressing   Comments: assist for adls; walked to dining room and toileting mod I        OT Problem List: Decreased strength;Decreased activity tolerance;Impaired balance (sitting and/or standing);Decreased knowledge of use of DME or AE;Decreased knowledge of precautions;Pain      OT Treatment/Interventions: Self-care/ADL training;DME and/or AE instruction;Patient/family education;Balance  training;Therapeutic activities    OT Goals(Current goals can be found in the care plan section) Acute Rehab OT Goals Patient Stated Goal: less pain.  OT Goal Formulation: With patient Time For Goal Achievement: 08/12/17 Potential to Achieve Goals: Good ADL Goals Pt Will Perform Grooming: with min guard assist;standing Pt Will Transfer to Toilet: with min guard assist;ambulating;bedside commode Pt Will Perform Toileting - Clothing Manipulation and hygiene: with min assist;sit to/from stand Additional ADL Goal #1: pt will perform bed mobility with min A in preparation for adls Additional ADL Goal #2: pt will demonstrate use of reacher for adls to decrease bending  OT Frequency: Min 2X/week   Barriers to D/C:            Co-evaluation PT/OT/SLP Co-Evaluation/Treatment: Yes Reason for Co-Treatment: For patient/therapist safety PT goals addressed during session: Mobility/safety with mobility OT goals addressed during session: ADL's and self-care      AM-PAC PT "6 Clicks" Daily Activity     Outcome Measure Help from another person eating meals?: None Help from another person taking care of personal grooming?: A Little Help from another person toileting, which includes using toliet, bedpan, or urinal?: A Lot Help from another person bathing (including washing, rinsing, drying)?: A Lot Help from another person to put on and taking off regular upper body clothing?: A Lot Help from another person to put on and taking off regular lower body clothing?: A Lot 6 Click Score: 15   End of Session Nurse Communication: Mobility status  Activity Tolerance: Patient limited by fatigue Patient left: in chair;with call bell/phone within reach;with chair alarm set  OT Visit Diagnosis: Muscle weakness (generalized) (M62.81)                Time: 1610-9604 OT Time Calculation (min): 28 min Charges:  OT General Charges $OT Visit: 1 Visit OT Evaluation $OT Eval Moderate Complexity: 1  Mod G-Codes:     [place], OTR/L [phone] 07/29/2017  [place]urora Rody 07/29/2017, 3:47 PM

## 2017-07-29 NOTE — Evaluation (Signed)
Physical Therapy Evaluation Patient Details Name: Marisa Smith MRN: [id number] DOB: 1924/05/17 Today's Date: 07/29/2017   History of Present Illness  84 yo female admitted with L1 compression fx. S/P kyphoplasty 07/28/17  Clinical Impression  On eval, pt required Mod assist for bed mobility and Min assist +2 for ambulation. She walked ~5 feet with a RW. Pt reported some back pain with activity. Pt presents with general weakness, decreased activity tolerance, and impaired gait and balance. Recommend SNF for ST rehab prior to returning to ALF. Will follow and progress activity as tolerated.     Follow Up Recommendations SNF    Equipment Recommendations  None recommended by PT    Recommendations for Other Services       Precautions / Restrictions Precautions Precautions: Fall Restrictions Weight Bearing Restrictions: No      Mobility  Bed Mobility Overal bed mobility: Needs Assistance Bed Mobility: Supine to Sit     Supine to sit: Mod assist;HOB elevated     General bed mobility comments: Assist for trunk and LEs. Pt relied on bedrail. Increased time. Cues for safety, technique.   Transfers Overall transfer level: Needs assistance Equipment used: Rolling walker (2 wheeled) Transfers: Sit to/from [employer]Stand;Stand Pivot Transfers Sit to Stand: Min assist;From elevated surface Stand pivot transfers: Min assist       General transfer comment: Assist to rise, stabilize, control descent. VCs safety, technique, hand placement. Stand pivot with RW  Ambulation/Gait Ambulation/Gait assistance: Min assist;+2 safety/equipment Gait Distance (Feet): 5 Feet Assistive device: Rolling walker (2 wheeled) Gait Pattern/deviations: Step-through pattern;Decreased stride length     General Gait Details: Assist to stabilize pt. Followed with recliner for safety. Pt fatigues easily.   Stairs            Wheelchair Mobility    Modified Rankin (Stroke Patients Only)       Balance  Overall balance assessment: Needs assistance         Standing balance support: Bilateral upper extremity supported Standing balance-Leahy Scale: Poor                               Pertinent Vitals/Pain Pain Assessment: Faces Faces Pain Scale: Hurts even more Pain Location: back with mobility Pain Descriptors / Indicators: Aching;Sore;Grimacing Pain Intervention(s): Monitored during session;Repositioned    Home Living Family/patient expects to be discharged to:: Unsure     Type of Home: Assisted living         Home Equipment: Environmental consultantWalker - 2 wheels      Prior Function Level of Independence: Needs assistance   Gait / Transfers Assistance Needed: walker for ambulation   ADL's / Homemaking Assistance Needed: assist for bathing, dressing  Comments: (P) assist for adls; walked to dining room and toileting mod I     Hand Dominance        Extremity/Trunk Assessment   Upper Extremity Assessment Upper Extremity Assessment: Generalized weakness    Lower Extremity Assessment Lower Extremity Assessment: Generalized weakness    Cervical / Trunk Assessment Cervical / Trunk Assessment: Kyphotic  Communication   Communication: HOH  Cognition Arousal/Alertness: Awake/alert Behavior During Therapy: WFL for tasks assessed/performed Overall Cognitive Status: Within Functional Limits for tasks assessed                                        General Comments  Exercises     Assessment/Plan    PT Assessment Patient needs continued PT services  PT Problem List Decreased strength;Decreased balance;Decreased mobility;Decreased activity tolerance;Decreased knowledge of use of DME;Pain       PT Treatment Interventions DME instruction;Gait training;Functional mobility training;Therapeutic activities;Balance training;Patient/family education;Therapeutic exercise    PT Goals (Current goals can be found in the Care Plan section)  Acute Rehab PT  Goals Patient Stated Goal: less pain.  PT Goal Formulation: With patient Time For Goal Achievement: 08/12/17 Potential to Achieve Goals: Good    Frequency Min 2X/week   Barriers to discharge        Co-evaluation   Reason for Co-Treatment: For patient/therapist safety PT goals addressed during session: Mobility/safety with mobility OT goals addressed during session: (P) ADL's and self-care       AM-PAC PT "6 Clicks" Daily Activity  Outcome Measure Difficulty turning over in bed (including adjusting bedclothes, sheets and blankets)?: A Lot Difficulty moving from lying on back to sitting on the side of the bed? : Unable Difficulty sitting down on and standing up from a chair with arms (e.g., wheelchair, bedside commode, etc,.)?: Unable Help needed moving to and from a bed to chair (including a wheelchair)?: A Little Help needed walking in hospital room?: A Little Help needed climbing 3-5 steps with a railing? : A Lot 6 Click Score: 12    End of Session   Activity Tolerance: Patient limited by fatigue Patient left: in chair;with call bell/phone within reach;with chair alarm set   PT Visit Diagnosis: Muscle weakness (generalized) (M62.81);Difficulty in walking, not elsewhere classified (R26.2);Pain Pain - part of body: (back)    Time: 1610-9604 PT Time Calculation (min) (ACUTE ONLY): 19 min   Charges:   PT Evaluation $PT Eval Moderate Complexity: 1 Mod     PT G Codes:          Rebeca Alert, MPT Pager: [phone]

## 2017-07-30 NOTE — [place] FL2 (Signed)
[place] MEDICAID FL2 LEVEL OF CARE SCREENING TOOL     IDENTIFICATION  Patient Name: Marisa Smith Birthdate: 1924-02-19 Sex: female Admission Date (Current Location): 07/27/2017  [facility] and [place] Number:  Producer, television/film/video and Address:  [facility],  [address] [place] [address]      Provider Number: [id number]  Attending Physician Name and Address:  Dimple Nanas, MD  Relative Name and Phone Number:       Current Level of Care: Hospital Recommended Level of Care: Assisted Living Facility Prior Approval Number:    Date Approved/Denied:   PASRR Number:    Discharge Plan: Other (Comment)(ALF)    Current Diagnoses: Patient Active Problem List   Diagnosis Date Noted  . Compression fracture of L1 lumbar vertebra (HCC) 07/28/2017  . Closed compression fracture of L1 lumbar vertebra, initial encounter (HCC) 07/27/2017  . Hydronephrosis of left kidney 07/27/2017  . A-fib (HCC) 07/27/2017  . Mass of left kidney 07/27/2017  . Wrist fracture 02/29/2016  . Distal radius fracture, right 02/29/2016  . Proximal left humerus fracture 02/22/2013  . Radius fracture 02/16/2013  . Bradycardia 12/06/2011  . Hyponatremia 11/24/2011  . Hyperglycemia 11/24/2011  . Autoimmune hepatitis (HCC)   . Headache(784.0) 02/15/2010  . TREMOR 02/02/2010  . ANXIETY 12/11/2009  . DEPRESSION, MAJOR, MODERATE 10/19/2009  . Hypothyroidism 04/26/2009  . Unspecified vitamin D deficiency 04/26/2009  . HYPERTENSION, BENIGN ESSENTIAL 04/26/2009  . OSTEOPOROSIS 04/26/2009    Orientation RESPIRATION BLADDER Height & Weight     Self, Place  Normal Incontinent Weight: 121 lb 4.1 oz (55 kg) Height:  5\' 7"  (170.2 cm)  BEHAVIORAL SYMPTOMS/MOOD NEUROLOGICAL BOWEL NUTRITION STATUS      Continent Diet(heart healthy diet)  AMBULATORY STATUS COMMUNICATION OF NEEDS Skin   Extensive Assist Verbally Normal                       Personal Care Assistance Level of Assistance   Bathing, Feeding, Dressing Bathing Assistance: Limited assistance Feeding assistance: Independent Dressing Assistance: Limited assistance     Functional Limitations Info  Sight, Hearing, Speech Sight Info: Adequate Hearing Info: Adequate Speech Info: Adequate    SPECIAL CARE FACTORS FREQUENCY  PT (By licensed PT), OT (By licensed OT)     PT Frequency: 3x OT Frequency: 3x            Contractures Contractures Info: Not present    Additional Factors Info  Code Status, Allergies Code Status Info: DNR Allergies Info: Amlodipine Besylate, Azatadine, Azathioprine, Penicillins, Sertraline Hcl, Sulfonamide Derivatives           Current Medications (07/30/2017):  This is the current hospital active medication list Current Facility-Administered Medications  Medication Dose Route Frequency Provider Last Rate Last Dose  . 0.9 %  sodium chloride infusion   Intravenous Continuous Hillary Bow, DO 75 mL/hr at 07/30/17 0541    . acetaminophen (TYLENOL) tablet 650 mg  650 mg Oral Q6H PRN Hillary Bow, DO       Or  . acetaminophen (TYLENOL) suppository 650 mg  650 mg Rectal Q6H PRN Hillary Bow, DO      . busPIRone (BUSPAR) tablet 15 mg  15 mg Oral BID Lyda Perone M, DO   15 mg at 07/30/17 0949  . calcium-vitamin D (OSCAL WITH D) 500-200 MG-UNIT per tablet 1 tablet  1 tablet Oral Q breakfast Hillary Bow, DO   1 tablet at 07/30/17 8119  .  cholecalciferol (VITAMIN D) tablet 1,000 Units  1,000 Units Oral Daily Hillary Bow, DO   1,000 Units at 07/30/17 0949  . ibuprofen (ADVIL,MOTRIN) tablet 200 mg  200 mg Oral Q8H Arrien, York Ram, MD   200 mg at 07/30/17 0540  . lactulose (CHRONULAC) 10 GM/15ML solution 20 g  20 g Oral BID PRN Hillary Bow, DO      . levothyroxine (SYNTHROID, LEVOTHROID) tablet 50 mcg  50 mcg Oral QAC breakfast Hillary Bow, DO   50 mcg at 07/30/17 4540  . loratadine (CLARITIN) tablet 10 mg  10 mg Oral Daily Lyda Perone M, DO   10  mg at 07/30/17 0949  . LORazepam (ATIVAN) tablet 0.5 mg  0.5 mg Oral BID Lyda Perone M, DO   0.5 mg at 07/30/17 0949  . mercaptopurine (PURINETHOL) tablet 25 mg  25 mg Oral Daily Lyda Perone M, DO   25 mg at 07/30/17 0949  . mirtazapine (REMERON) tablet 45 mg  45 mg Oral QHS Lyda Perone M, DO   45 mg at 07/29/17 2003  . morphine 2 MG/ML injection 2-4 mg  2-4 mg Intravenous Q4H PRN Hillary Bow, DO   2 mg at 07/28/17 2047  . ondansetron (ZOFRAN) tablet 4 mg  4 mg Oral Q6H PRN Hillary Bow, DO       Or  . ondansetron [facility]) injection 4 mg  4 mg Intravenous Q6H PRN Hillary Bow, DO      . polyvinyl alcohol (LIQUIFILM TEARS) 1.4 % ophthalmic solution 1 drop  1 drop Both Eyes TID Hillary Bow, DO   1 drop at 07/30/17 0949  . senna (SENOKOT) tablet 8.6 mg  1 tablet Oral Daily Lyda Perone M, DO   8.6 mg at 07/30/17 0949  . verapamil (CALAN-SR) CR tablet 120 mg  120 mg Oral QHS Lyda Perone M, DO   120 mg at 07/29/17 2002     Discharge Medications: TAKE these medications   acetaminophen 500 MG tablet Commonly known as:  TYLENOL Take 1,000 mg by mouth every 6 (six) hours as needed for mild pain or headache.   acetaminophen 325 MG tablet Commonly known as:  TYLENOL Take 325 mg by mouth every 4 (four) hours as needed for mild pain (right hip pain).   ARTIFICIAL TEARS 1-0.3 % Soln Generic drug:  Propylene Glycol-Glycerin Place 1 drop into both eyes 3 (three) times daily.   aspirin EC 81 MG tablet Take 81 mg by mouth daily.   BIOFREEZE ROLL-ON 4 % Gel Generic drug:  Menthol (Topical Analgesic) Apply 1 application topically 3 (three) times daily.   busPIRone 15 MG tablet Commonly known as:  BUSPAR Take 15 mg by mouth 2 (two) times daily.   Calcium Carbonate-Vitamin D 600-200 MG-UNIT Caps Take 1 capsule by mouth daily.   cetirizine 10 MG tablet Commonly known as:  ZYRTEC Take 10 mg by mouth daily.   Glycerin-Hypromellose-PEG 400 0.2-0.36-1 %  Soln Place 1 drop into both eyes 3 (three) times daily.   HYDROcodone-acetaminophen 5-325 MG tablet Commonly known as:  NORCO Take 1 tablet by mouth every 6 (six) hours as needed for moderate pain.   lactulose 10 GM/15ML solution Commonly known as:  CHRONULAC Take 30 mLs by mouth 2 (two) times daily as needed for constipation.   levothyroxine 50 MCG tablet Commonly known as:  SYNTHROID, LEVOTHROID Take 1 tablet (50 mcg total) by mouth daily before breakfast.   LORazepam 0.5 MG tablet Commonly  known as:  ATIVAN Take 0.5 mg by mouth 2 (two) times daily.   mercaptopurine 50 MG tablet Commonly known as:  PURINETHOL Take 0.5 tablets (25 mg total) by mouth daily. Give on an empty stomach 1 hour before or 2 hours after meals. Caution: Chemotherapy.   mirtazapine 45 MG tablet Commonly known as:  REMERON Take 45 mg by mouth at bedtime.   OCUSOFT LID SCRUB Pads Place 1 each into both eyes 2 (two) times daily.   ondansetron 8 MG disintegrating tablet Commonly known as:  ZOFRAN ODT Take 1 tablet (8 mg total) by mouth every 8 (eight) hours as needed for nausea or vomiting.   senna 8.6 MG Tabs tablet Commonly known as:  SENOKOT Take 1 tablet by mouth daily.   verapamil 120 MG 24 hr capsule Commonly known as:  VERELAN PM Take 120 mg by mouth at bedtime.   Vitamin D3 1000 units Caps Take 1,000 Units by mouth daily.         Relevant Imaging Results:  Relevant Lab Results:   Additional Information SSN: [id number]  Laquasia Pincus R [place], [place]

## 2017-07-30 NOTE — Progress Notes (Signed)
Pt flagged for SIRS. MD Nelson ChimesAmin made aware. No changes, [facility].

## 2017-07-30 NOTE — Progress Notes (Signed)
[facility]- report #[phone]. Will arrange [facility] transportation DC information provided via the HUB. Pt's daughter aware of DC arrangements.  Ilean SkillMeghan Ansel Ferrall, MSW, LCSW Clinical Social Work 07/30/2017 [phone]

## 2017-07-30 NOTE — Progress Notes (Signed)
[facility] called [facility] and given report to Saks IncorporatedCynthia RN. No questions or concerns voiced when asked. Pt left unit via PT[facility].

## 2017-07-30 NOTE — Progress Notes (Signed)
PROGRESS NOTE    Marisa Smith  JWJ:[id number] DOB: Oct 14, 1924 DOA: 07/27/2017 PCP: Merlene Laughter, MD   Brief Narrative:   84 year old female with history of osteoporosis essential hypertension came to the hospital with complaints of lower back pain.  Upon admission she was found to have acute L1 vertebral compression fracture complicated by hyponatremia.  Interventional radiology was consulted who performed L1 kyphoplasty on 6/25, patient tolerated this procedure well.  Patient was evaluated by physical therapy during the hospital stay as well who recommended skilled nursing facility.    Assessment & Plan:   Principal Problem:   Closed compression fracture of L1 lumbar vertebra, initial encounter [facility]) Active Problems:   Hypothyroidism   HYPERTENSION, BENIGN ESSENTIAL   Hydronephrosis of left kidney   A-fib (HCC)   Mass of left kidney   Compression fracture of L1 lumbar vertebra (HCC)   Lower back pain secondary to acute L1 compression fracture Status post L1 kyphoplasty 6/25 - Status post kyphoplasty by interventional radiology.  Physical therapy evaluation-skilled nursing facility -Pain control  Hyponatremia likely secondary to contraction alkalosis from dehydration; resolved  - This is improved with IV fluids.  Left-sided hydronephrosis Nonobstructive renal stone - Repeat ultrasound showed resolution of left-sided hydronephrosis secondary to likely passing of the stone.  Advised to continue oral hydration  Hypothyroidism -Continue Synthroid  Chronic atrial fibrillation -Continue verapamil and resume home medications  History of depression -Continue medications    DVT prophylaxis: SCDs while here  Code Status: Full  Family Communication:  None at bedside  Disposition Plan: SNF today   Consultants:   IR  Procedures:   Khyphoplasty 6/25  Antimicrobials:   None    Subjective: No complaints. Feeling generally weak.   Review of  Systems Otherwise negative except as per HPI, including: General: Denies fever, chills, night sweats or unintended weight loss. Resp: Denies cough, wheezing, shortness of breath. Cardiac: Denies chest pain, palpitations, orthopnea, paroxysmal nocturnal dyspnea. GI: Denies abdominal pain, nausea, vomiting, diarrhea or constipation GU: Denies dysuria, frequency, hesitancy or incontinence MS: Denies muscle aches, joint pain or swelling Neuro: Denies headache, neurologic deficits (focal weakness, numbness, tingling), abnormal gait Psych: Denies anxiety, depression, SI/HI/AVH Skin: Denies new rashes or lesions ID: Denies sick contacts, exotic exposures, travel  Objective: Vitals:   07/29/17 0512 07/29/17 1518 07/29/17 2109 07/30/17 0449  BP: 125/84 135/68 (!) 151/93 (!) 172/85  Pulse: 80 79 82 79  Resp: 18 16 20 16   Temp: 98.7 F (37.1 C) 97.8 F (36.6 C) 98.3 F (36.8 C) 98.3 F (36.8 C)  TempSrc:  Oral Oral Oral  SpO2: 94% 98% 98% 99%  Weight:      Height:        Intake/Output Summary (Last 24 hours) at 07/30/2017 1012 Last data filed at 07/30/2017 0916 Gross per 24 hour  Intake 2502.5 ml  Output 1200 ml  Net 1302.5 ml   Filed Weights   07/28/17 0017  Weight: 55 kg (121 lb 4.1 oz)    Examination:  General exam: Appears calm and comfortable; generally weak elderly frail appearing.  Respiratory system: Clear to auscultation. Respiratory effort normal. Cardiovascular system: S1 & S2 heard, RRR. No JVD, murmurs, rubs, gallops or clicks. No pedal edema. Gastrointestinal system: Abdomen is nondistended, soft and nontender. No organomegaly or masses felt. Normal bowel sounds heard. Central nervous system: Alert and oriented. No focal neurological deficits. Extremities: Symmetric 4 x 5 power. Skin: No rashes, lesions or ulcers Psychiatry: Judgement and insight appear normal. Mood &  affect appropriate.     Data Reviewed:   CBC: Recent Labs  Lab 07/27/17 1546  07/29/17 0453  WBC 8.2 9.3  NEUTROABS  --  7.0  HGB 12.5 12.7  HCT 36.5 38.5  MCV 98.6 100.0  PLT 263 289   Basic Metabolic Panel: Recent Labs  Lab 07/27/17 1546 07/28/17 0437 07/29/17 0453  NA 129* 133* 132*  K 3.5 3.2* 3.9  CL 91* 98 99  CO2 29 29 25   GLUCOSE 116* 108* 98  BUN 13 9 11   CREATININE 0.72 0.66 0.61  CALCIUM 9.3 8.4* 8.4*   GFR: Estimated Creatinine Clearance: 39.8 mL/min (by C-G formula based on SCr of 0.61 mg/dL). Liver Function Tests: Recent Labs  Lab 07/27/17 1546  AST 17  ALT 12*  ALKPHOS 122  BILITOT 0.3  PROT 7.1  ALBUMIN 3.2*   Recent Labs  Lab 07/27/17 1546  LIPASE 32   No results for input(s): AMMONIA in the last 168 hours. Coagulation Profile: Recent Labs  Lab 07/28/17 1446  INR 1.11   Cardiac Enzymes: Recent Labs  Lab 07/27/17 1546  TROPONINI <0.03   BNP (last 3 results) No results for input(s): PROBNP in the last 8760 hours. HbA1C: No results for input(s): HGBA1C in the last 72 hours. CBG: No results for input(s): GLUCAP in the last 168 hours. Lipid Profile: No results for input(s): CHOL, HDL, LDLCALC, TRIG, CHOLHDL, LDLDIRECT in the last 72 hours. Thyroid Function Tests: No results for input(s): TSH, T4TOTAL, FREET4, T3FREE, THYROIDAB in the last 72 hours. Anemia Panel: No results for input(s): VITAMINB12, FOLATE, FERRITIN, TIBC, IRON, RETICCTPCT in the last 72 hours. Sepsis Labs: No results for input(s): PROCALCITON, LATICACIDVEN in the last 168 hours.  Recent Results (from the past 240 hour(s))  MRSA PCR Screening     Status: None   Collection Time: 07/28/17  1:51 AM  Result Value Ref Range Status   MRSA by PCR NEGATIVE NEGATIVE Final    Comment:        The GeneXpert MRSA Assay (FDA approved for NASAL specimens only), is one component of a comprehensive MRSA colonization surveillance program. It is not intended to diagnose MRSA infection nor to guide or monitor treatment for MRSA infections. Performed  at [facility], [address] [place], [place] [address]          Radiology Studies: US Renal  Result Date: 07/29/2017 CLINICAL DATA:  Hydronephrosis EXAM: RENAL / URINARY TRACT ULTRASOUND COMPLETE COMPARISON:  CT abdomen pelvis 07/27/2017 FINDINGS: Right Kidney: Length: 9.0 cm. Echogenicity within normal limits. No mass or hydronephrosis visualized. Left Kidney: Length: 10.7 cm. Interval resolution of left hydronephrosis. With the recent CT. 4 x 6 mm nonobstructing stone left lower pole. Small cyst left lower pole not visualized by ultrasound but is identified by CT. Bladder: Bilateral ureteral jets noted. Debris within the urinary bladder. No mass lesion. IMPRESSION: Interval resolution of left hydronephrosis. 4 x 6 mm nonobstructing stone left lower pole. Left lower pole cyst not identified by ultrasound Bilateral ureteral jets identified.  Question recently passed stone. Electronically Signed   By: Marlan Palau M.D.   On: 07/29/2017 11:30   Ir Kypho Lumbar Inc Fx Reduce Bone Bx Uni/bil Cannulation Inc/imaging  Result Date: 07/28/2017 CLINICAL DATA:  Subacute painful L1 compression fracture deformity. EXAM: KYPHOPLASTY AT LUMBAR L1 TECHNIQUE: The procedure, risks (including but not limited to bleeding, infection, organ damage), benefits, and alternatives were explained to the patient and daughter. Questions regarding the procedure were  encouraged and answered. The patient understands and consents to the procedure. The patient was placed prone on the fluoroscopic table. The skin overlying the upper lumbar region was then prepped and draped in the usual sterile fashion. Maximal barrier sterile technique was utilized including caps, mask, sterile gowns, sterile gloves, sterile drape, hand hygiene and skin antiseptic. Intravenous Fentanyl and Versed were administered as conscious sedation during continuous cardiorespiratory monitoring by the radiology RN, with a total moderate  sedation time of28 minutes. As antibiotic prophylaxis, vancomycin 1 g was ordered pre-procedure and administered intravenously within !one hour! of incision. The right pedicle at lumbar L1 was then infiltrated with 1% lidocaine followed by the advancement of a Kyphon trocar needle through the right pedicle into the posterior one-third. The trocar was removed and the osteo drill was advanced to the anterior third of the vertebral body. The osteo drill was retracted. Through the working cannula, a Kyphon inflatable bone tamp 15 x 2 was advanced and positioned with the distal marker 5 mm from the anterior aspect of the cortex. Crossing of the midline was seen on the AP projection. At this time, the balloon was expanded using contrast via a Kyphon inflation syringe device via micro tubing. Inflation continued until there was near apposition across the midline and with the superior and the inferior end plates. At this time, methylmethacrylate mixture was reconstituted in the Kyphon bone mixing device system. This was then loaded into the delivery mechanism, attached to Kyphon bone fillers. The balloon was deflated and removed followed by the instillation of methylmethacrylate mixture with excellent filling in the AP and lateral projections. No extravasation was noted in the disk spaces or posteriorly into the spinal canal. No epidural venous contamination was seen. The patient tolerated the procedure well. There were no acute complications. The working cannulae and the bone filler were then retrieved and removed. COMPLICATIONS: COMPLICATIONS None immediate. IMPRESSION: 1. Status post vertebral body augmentation using balloon kyphoplasty at lumbar L1 as described without event. 2. Per CMS PQRS reporting requirements (PQRS Measure 24): Given the patient's age of greater than 50 and the fracture site (hip, distal radius, or spine), the patient should be tested for osteoporosis using DXA, and the appropriate treatment  considered based on the DXA results. Electronically Signed   By: Corlis Leak  Hassell M.D.   On: 07/28/2017 17:01        Scheduled Meds: . busPIRone  15 mg Oral BID  . calcium-vitamin D  1 tablet Oral Q breakfast  . cholecalciferol  1,000 Units Oral Daily  . ibuprofen  200 mg Oral Q8H  . levothyroxine  50 mcg Oral QAC breakfast  . loratadine  10 mg Oral Daily  . LORazepam  0.5 mg Oral BID  . mercaptopurine  25 mg Oral Daily  . mirtazapine  45 mg Oral QHS  . polyvinyl alcohol  1 drop Both Eyes TID  . senna  1 tablet Oral Daily  . verapamil  120 mg Oral QHS   Continuous Infusions: . sodium chloride 75 mL/hr at 07/30/17 0541     LOS: 2 days    I have spent 35 minutes face to face with the patient and on the ward discussing the patients care, assessment, plan and disposition with other care givers. >50% of the time was devoted counseling the patient about the risks and benefits of treatment and coordinating care.     Nancye Grumbine Joline Maxcyhirag Serrina Minogue, MD [facility] Pager [phone]   If 7PM-7AM, please contact night-coverage [email or web address] Password [email or web address]  07/30/2017, 10:12 AM

## 2017-07-31 DIAGNOSIS — M545 Low back pain: Secondary | ICD-10-CM | POA: Diagnosis not present

## 2017-07-31 DIAGNOSIS — S32010D Wedge compression fracture of first lumbar vertebra, subsequent encounter for fracture with routine healing: Secondary | ICD-10-CM | POA: Diagnosis not present

## 2017-07-31 DIAGNOSIS — E871 Hypo-osmolality and hyponatremia: Secondary | ICD-10-CM | POA: Diagnosis not present

## 2017-07-31 DIAGNOSIS — M6281 Muscle weakness (generalized): Secondary | ICD-10-CM | POA: Diagnosis not present

## 2017-08-04 DIAGNOSIS — M6281 Muscle weakness (generalized): Secondary | ICD-10-CM | POA: Diagnosis not present

## 2017-08-04 DIAGNOSIS — E871 Hypo-osmolality and hyponatremia: Secondary | ICD-10-CM | POA: Diagnosis not present

## 2017-08-04 DIAGNOSIS — M545 Low back pain: Secondary | ICD-10-CM | POA: Diagnosis not present

## 2017-08-04 DIAGNOSIS — S32010D Wedge compression fracture of first lumbar vertebra, subsequent encounter for fracture with routine healing: Secondary | ICD-10-CM | POA: Diagnosis not present

## 2017-08-06 DIAGNOSIS — D649 Anemia, unspecified: Secondary | ICD-10-CM | POA: Diagnosis not present

## 2017-08-06 DIAGNOSIS — K754 Autoimmune hepatitis: Secondary | ICD-10-CM | POA: Diagnosis not present

## 2017-08-06 DIAGNOSIS — Z79899 Other long term (current) drug therapy: Secondary | ICD-10-CM | POA: Diagnosis not present

## 2017-08-06 DIAGNOSIS — E559 Vitamin D deficiency, unspecified: Secondary | ICD-10-CM | POA: Diagnosis not present

## 2017-08-13 DIAGNOSIS — L603 Nail dystrophy: Secondary | ICD-10-CM | POA: Diagnosis not present

## 2017-08-13 DIAGNOSIS — I739 Peripheral vascular disease, unspecified: Secondary | ICD-10-CM | POA: Diagnosis not present

## 2017-08-13 DIAGNOSIS — F419 Anxiety disorder, unspecified: Secondary | ICD-10-CM | POA: Diagnosis not present

## 2017-08-13 DIAGNOSIS — Q845 Enlarged and hypertrophic nails: Secondary | ICD-10-CM | POA: Diagnosis not present

## 2017-08-13 DIAGNOSIS — F330 Major depressive disorder, recurrent, mild: Secondary | ICD-10-CM | POA: Diagnosis not present

## 2017-08-20 DIAGNOSIS — F419 Anxiety disorder, unspecified: Secondary | ICD-10-CM | POA: Diagnosis not present

## 2017-08-20 DIAGNOSIS — F330 Major depressive disorder, recurrent, mild: Secondary | ICD-10-CM | POA: Diagnosis not present

## 2017-08-27 DIAGNOSIS — M6281 Muscle weakness (generalized): Secondary | ICD-10-CM | POA: Diagnosis not present

## 2017-08-27 DIAGNOSIS — S32010D Wedge compression fracture of first lumbar vertebra, subsequent encounter for fracture with routine healing: Secondary | ICD-10-CM | POA: Diagnosis not present

## 2017-08-27 DIAGNOSIS — F419 Anxiety disorder, unspecified: Secondary | ICD-10-CM | POA: Diagnosis not present

## 2017-08-27 DIAGNOSIS — F330 Major depressive disorder, recurrent, mild: Secondary | ICD-10-CM | POA: Diagnosis not present

## 2017-08-31 DIAGNOSIS — S32010D Wedge compression fracture of first lumbar vertebra, subsequent encounter for fracture with routine healing: Secondary | ICD-10-CM | POA: Diagnosis not present

## 2017-08-31 DIAGNOSIS — M6281 Muscle weakness (generalized): Secondary | ICD-10-CM | POA: Diagnosis not present

## 2017-09-03 DIAGNOSIS — S32010D Wedge compression fracture of first lumbar vertebra, subsequent encounter for fracture with routine healing: Secondary | ICD-10-CM | POA: Diagnosis not present

## 2017-09-03 DIAGNOSIS — M6281 Muscle weakness (generalized): Secondary | ICD-10-CM | POA: Diagnosis not present

## 2017-09-07 DIAGNOSIS — S32010D Wedge compression fracture of first lumbar vertebra, subsequent encounter for fracture with routine healing: Secondary | ICD-10-CM | POA: Diagnosis not present

## 2017-09-07 DIAGNOSIS — M6281 Muscle weakness (generalized): Secondary | ICD-10-CM | POA: Diagnosis not present

## 2017-09-09 DIAGNOSIS — K5900 Constipation, unspecified: Secondary | ICD-10-CM | POA: Diagnosis not present

## 2017-09-09 DIAGNOSIS — M6281 Muscle weakness (generalized): Secondary | ICD-10-CM | POA: Diagnosis not present

## 2017-09-09 DIAGNOSIS — I10 Essential (primary) hypertension: Secondary | ICD-10-CM | POA: Diagnosis not present

## 2017-09-09 DIAGNOSIS — I4891 Unspecified atrial fibrillation: Secondary | ICD-10-CM | POA: Diagnosis not present

## 2017-09-10 DIAGNOSIS — F330 Major depressive disorder, recurrent, mild: Secondary | ICD-10-CM | POA: Diagnosis not present

## 2017-09-10 DIAGNOSIS — F419 Anxiety disorder, unspecified: Secondary | ICD-10-CM | POA: Diagnosis not present

## 2017-09-16 ENCOUNTER — Emergency Department (HOSPITAL_COMMUNITY): Payer: Medicare Other

## 2017-09-16 ENCOUNTER — Inpatient Hospital Stay (HOSPITAL_COMMUNITY)
Admission: EM | Admit: 2017-09-16 | Discharge: 2017-09-21 | DRG: 064 | Disposition: A | Discharge status: Skilled Nursing Facility | Payer: Medicare Other | Attending: Internal Medicine | Admitting: Internal Medicine

## 2017-09-16 DIAGNOSIS — K5900 Constipation, unspecified: Secondary | ICD-10-CM | POA: Diagnosis not present

## 2017-09-16 DIAGNOSIS — I361 Nonrheumatic tricuspid (valve) insufficiency: Secondary | ICD-10-CM | POA: Diagnosis not present

## 2017-09-16 DIAGNOSIS — Z9841 Cataract extraction status, right eye: Secondary | ICD-10-CM

## 2017-09-16 DIAGNOSIS — Z7189 Other specified counseling: Secondary | ICD-10-CM | POA: Diagnosis not present

## 2017-09-16 DIAGNOSIS — Z7982 Long term (current) use of aspirin: Secondary | ICD-10-CM | POA: Diagnosis not present

## 2017-09-16 DIAGNOSIS — M6281 Muscle weakness (generalized): Secondary | ICD-10-CM | POA: Diagnosis not present

## 2017-09-16 DIAGNOSIS — W19XXXA Unspecified fall, initial encounter: Secondary | ICD-10-CM | POA: Diagnosis present

## 2017-09-16 DIAGNOSIS — K754 Autoimmune hepatitis: Secondary | ICD-10-CM | POA: Diagnosis present

## 2017-09-16 DIAGNOSIS — Z515 Encounter for palliative care: Secondary | ICD-10-CM | POA: Diagnosis present

## 2017-09-16 DIAGNOSIS — I499 Cardiac arrhythmia, unspecified: Secondary | ICD-10-CM | POA: Diagnosis not present

## 2017-09-16 DIAGNOSIS — R4182 Altered mental status, unspecified: Secondary | ICD-10-CM | POA: Diagnosis not present

## 2017-09-16 DIAGNOSIS — Z882 Allergy status to sulfonamides status: Secondary | ICD-10-CM

## 2017-09-16 DIAGNOSIS — E46 Unspecified protein-calorie malnutrition: Secondary | ICD-10-CM | POA: Diagnosis present

## 2017-09-16 DIAGNOSIS — N189 Chronic kidney disease, unspecified: Secondary | ICD-10-CM | POA: Diagnosis not present

## 2017-09-16 DIAGNOSIS — E876 Hypokalemia: Secondary | ICD-10-CM | POA: Diagnosis not present

## 2017-09-16 DIAGNOSIS — R52 Pain, unspecified: Secondary | ICD-10-CM | POA: Diagnosis not present

## 2017-09-16 DIAGNOSIS — Z79890 Hormone replacement therapy (postmenopausal): Secondary | ICD-10-CM

## 2017-09-16 DIAGNOSIS — I69320 Aphasia following cerebral infarction: Secondary | ICD-10-CM | POA: Diagnosis not present

## 2017-09-16 DIAGNOSIS — R279 Unspecified lack of coordination: Secondary | ICD-10-CM | POA: Diagnosis not present

## 2017-09-16 DIAGNOSIS — I481 Persistent atrial fibrillation: Secondary | ICD-10-CM | POA: Diagnosis not present

## 2017-09-16 DIAGNOSIS — I63412 Cerebral infarction due to embolism of left middle cerebral artery: Secondary | ICD-10-CM | POA: Diagnosis not present

## 2017-09-16 DIAGNOSIS — R402362 Coma scale, best motor response, obeys commands, at arrival to emergency department: Secondary | ICD-10-CM | POA: Diagnosis present

## 2017-09-16 DIAGNOSIS — R402232 Coma scale, best verbal response, inappropriate words, at arrival to emergency department: Secondary | ICD-10-CM | POA: Diagnosis present

## 2017-09-16 DIAGNOSIS — I639 Cerebral infarction, unspecified: Secondary | ICD-10-CM | POA: Diagnosis present

## 2017-09-16 DIAGNOSIS — R296 Repeated falls: Secondary | ICD-10-CM | POA: Diagnosis not present

## 2017-09-16 DIAGNOSIS — Z888 Allergy status to other drugs, medicaments and biological substances status: Secondary | ICD-10-CM

## 2017-09-16 DIAGNOSIS — G459 Transient cerebral ischemic attack, unspecified: Secondary | ICD-10-CM | POA: Diagnosis not present

## 2017-09-16 DIAGNOSIS — I6300 Cerebral infarction due to thrombosis of unspecified precerebral artery: Secondary | ICD-10-CM | POA: Diagnosis not present

## 2017-09-16 DIAGNOSIS — R402132 Coma scale, eyes open, to sound, at arrival to emergency department: Secondary | ICD-10-CM | POA: Diagnosis present

## 2017-09-16 DIAGNOSIS — I6789 Other cerebrovascular disease: Secondary | ICD-10-CM | POA: Diagnosis not present

## 2017-09-16 DIAGNOSIS — Z8673 Personal history of transient ischemic attack (TIA), and cerebral infarction without residual deficits: Secondary | ICD-10-CM | POA: Diagnosis not present

## 2017-09-16 DIAGNOSIS — R1312 Dysphagia, oropharyngeal phase: Secondary | ICD-10-CM | POA: Diagnosis not present

## 2017-09-16 DIAGNOSIS — F419 Anxiety disorder, unspecified: Secondary | ICD-10-CM | POA: Diagnosis not present

## 2017-09-16 DIAGNOSIS — Z880 Allergy status to penicillin: Secondary | ICD-10-CM

## 2017-09-16 DIAGNOSIS — R29712 NIHSS score 12: Secondary | ICD-10-CM | POA: Diagnosis not present

## 2017-09-16 DIAGNOSIS — R29818 Other symptoms and signs involving the nervous system: Secondary | ICD-10-CM | POA: Diagnosis not present

## 2017-09-16 DIAGNOSIS — Z66 Do not resuscitate: Secondary | ICD-10-CM | POA: Diagnosis present

## 2017-09-16 DIAGNOSIS — E039 Hypothyroidism, unspecified: Secondary | ICD-10-CM | POA: Diagnosis not present

## 2017-09-16 DIAGNOSIS — N179 Acute kidney failure, unspecified: Secondary | ICD-10-CM | POA: Diagnosis not present

## 2017-09-16 DIAGNOSIS — I10 Essential (primary) hypertension: Secondary | ICD-10-CM | POA: Diagnosis not present

## 2017-09-16 DIAGNOSIS — Z681 Body mass index (BMI) 19 or less, adult: Secondary | ICD-10-CM | POA: Diagnosis not present

## 2017-09-16 DIAGNOSIS — E785 Hyperlipidemia, unspecified: Secondary | ICD-10-CM | POA: Diagnosis present

## 2017-09-16 DIAGNOSIS — Z9842 Cataract extraction status, left eye: Secondary | ICD-10-CM

## 2017-09-16 DIAGNOSIS — M810 Age-related osteoporosis without current pathological fracture: Secondary | ICD-10-CM | POA: Diagnosis not present

## 2017-09-16 DIAGNOSIS — R2689 Other abnormalities of gait and mobility: Secondary | ICD-10-CM | POA: Diagnosis not present

## 2017-09-16 DIAGNOSIS — F411 Generalized anxiety disorder: Secondary | ICD-10-CM | POA: Diagnosis not present

## 2017-09-16 DIAGNOSIS — I482 Chronic atrial fibrillation: Secondary | ICD-10-CM | POA: Diagnosis not present

## 2017-09-16 DIAGNOSIS — J690 Pneumonitis due to inhalation of food and vomit: Secondary | ICD-10-CM | POA: Diagnosis not present

## 2017-09-16 DIAGNOSIS — Z743 Need for continuous supervision: Secondary | ICD-10-CM | POA: Diagnosis not present

## 2017-09-16 DIAGNOSIS — I4891 Unspecified atrial fibrillation: Secondary | ICD-10-CM | POA: Diagnosis not present

## 2017-09-16 DIAGNOSIS — R29810 Facial weakness: Secondary | ICD-10-CM | POA: Diagnosis present

## 2017-09-16 DIAGNOSIS — R4701 Aphasia: Secondary | ICD-10-CM | POA: Diagnosis not present

## 2017-09-16 DIAGNOSIS — J189 Pneumonia, unspecified organism: Secondary | ICD-10-CM | POA: Diagnosis not present

## 2017-09-16 DIAGNOSIS — R0602 Shortness of breath: Secondary | ICD-10-CM | POA: Diagnosis not present

## 2017-09-16 DIAGNOSIS — Z79899 Other long term (current) drug therapy: Secondary | ICD-10-CM

## 2017-09-16 DIAGNOSIS — R41841 Cognitive communication deficit: Secondary | ICD-10-CM | POA: Diagnosis not present

## 2017-09-16 DIAGNOSIS — H01009 Unspecified blepharitis unspecified eye, unspecified eyelid: Secondary | ICD-10-CM | POA: Diagnosis not present

## 2017-09-16 DIAGNOSIS — R404 Transient alteration of awareness: Secondary | ICD-10-CM | POA: Diagnosis not present

## 2017-09-16 DIAGNOSIS — R0902 Hypoxemia: Secondary | ICD-10-CM | POA: Diagnosis not present

## 2017-09-16 LAB — DIFFERENTIAL
ABS IMMATURE GRANULOCYTES: 0 10*3/uL (ref 0.0–0.1)
BASOS ABS: 0 10*3/uL (ref 0.0–0.1)
Basophils Relative: 0 %
Eosinophils Absolute: 0.4 10*3/uL (ref 0.0–0.7)
Eosinophils Relative: 4 %
Immature Granulocytes: 0 %
LYMPHS PCT: 13 %
Lymphs Abs: 1.2 10*3/uL (ref 0.7–4.0)
MONO ABS: 1 10*3/uL (ref 0.1–1.0)
Monocytes Relative: 11 %
NEUTROS ABS: 6.6 10*3/uL (ref 1.7–7.7)
Neutrophils Relative %: 72 %

## 2017-09-16 LAB — COMPREHENSIVE METABOLIC PANEL
ALK PHOS: 126 U/L (ref 38–126)
ALT: 10 U/L (ref 0–44)
AST: 19 U/L (ref 15–41)
Albumin: 3.2 g/dL — ABNORMAL LOW (ref 3.5–5.0)
Anion gap: 14 (ref 5–15)
BUN: 19 mg/dL (ref 8–23)
CALCIUM: 9.1 mg/dL (ref 8.9–10.3)
CO2: 26 mmol/L (ref 22–32)
CREATININE: 1.08 mg/dL — AB (ref 0.44–1.00)
Chloride: 94 mmol/L — ABNORMAL LOW (ref 98–111)
GFR calc non Af Amer: 44 mL/min — ABNORMAL LOW (ref >60)
GFR, EST AFRICAN AMERICAN: 50 mL/min — AB (ref >60)
GLUCOSE: 102 mg/dL — AB (ref 70–99)
Potassium: 2.7 mmol/L — CL (ref 3.5–5.1)
SODIUM: 134 mmol/L — AB (ref 135–145)
Total Bilirubin: 1.3 mg/dL — ABNORMAL HIGH (ref 0.3–1.2)
Total Protein: 7 g/dL (ref 6.5–8.1)

## 2017-09-16 LAB — CBC
HEMATOCRIT: 39.6 % (ref 36.0–46.0)
HEMOGLOBIN: 13.3 g/dL (ref 12.0–15.0)
MCH: 33.3 pg (ref 26.0–34.0)
MCHC: 33.6 g/dL (ref 30.0–36.0)
MCV: 99.2 fL (ref 78.0–100.0)
Platelets: 264 10*3/uL (ref 150–400)
RBC: 3.99 MIL/uL (ref 3.87–5.11)
RDW: 13.5 % (ref 11.5–15.5)
WBC: 9.2 10*3/uL (ref 4.0–10.5)

## 2017-09-16 LAB — I-STAT CHEM 8, ED
BUN: 21 mg/dL (ref 8–23)
CREATININE: 1 mg/dL (ref 0.44–1.00)
Calcium, Ion: 1.1 mmol/L — ABNORMAL LOW (ref 1.15–1.40)
Chloride: 94 mmol/L — ABNORMAL LOW (ref 98–111)
Glucose, Bld: 98 mg/dL (ref 70–99)
HCT: 41 % (ref 36.0–46.0)
HEMOGLOBIN: 13.9 g/dL (ref 12.0–15.0)
Potassium: 2.7 mmol/L — CL (ref 3.5–5.1)
Sodium: 134 mmol/L — ABNORMAL LOW (ref 135–145)
TCO2: 26 mmol/L (ref 22–32)

## 2017-09-16 LAB — PHOSPHORUS: PHOSPHORUS: 2.8 mg/dL (ref 2.5–4.6)

## 2017-09-16 LAB — MAGNESIUM: MAGNESIUM: 2 mg/dL (ref 1.7–2.4)

## 2017-09-16 LAB — TSH: TSH: 5.627 u[IU]/mL — ABNORMAL HIGH (ref 0.350–4.500)

## 2017-09-16 LAB — ETHANOL: Alcohol, Ethyl (B): <10 mg/dL (ref <10)

## 2017-09-16 LAB — I-STAT TROPONIN, ED: Troponin i, poc: 0.04 ng/mL (ref 0.00–0.08)

## 2017-09-16 LAB — PROTIME-INR
INR: 1.07
Prothrombin Time: 13.8 seconds (ref 11.4–15.2)

## 2017-09-16 LAB — T4, FREE: Free T4: 1.6 ng/dL (ref 0.82–1.77)

## 2017-09-16 LAB — CBG MONITORING, ED: Glucose-Capillary: 94 mg/dL (ref 70–99)

## 2017-09-16 LAB — APTT: aPTT: 31 seconds (ref 24–36)

## 2017-09-16 MED ORDER — OCUSOFT LID SCRUB EX PADS: 1.0000 | MEDICATED_PAD | Freq: Two times a day (BID) | CUTANEOUS | Status: DC

## 2017-09-16 MED ORDER — VANCOMYCIN HCL 500 MG IV SOLR: 500.0000 mg | INTRAVENOUS | Status: DC

## 2017-09-16 MED ORDER — SODIUM CHLORIDE 0.9 % IV SOLN
2.0000 g | Freq: Once | INTRAVENOUS | Status: AC
  Administered 2017-09-16: 2 g via INTRAVENOUS
  Filled 2017-09-16: qty 2

## 2017-09-16 MED ORDER — ASPIRIN 300 MG RE SUPP
300.0000 mg | Freq: Every day | RECTAL | Status: DC
  Administered 2017-09-17 – 2017-09-18 (×2): 300 mg via RECTAL
  Filled 2017-09-16 (×2): qty 1

## 2017-09-16 MED ORDER — ATORVASTATIN CALCIUM 40 MG PO TABS
40.0000 mg | ORAL_TABLET | Freq: Every day | ORAL | Status: DC
  Administered 2017-09-18 – 2017-09-20 (×3): 40 mg via ORAL
  Filled 2017-09-16 (×3): qty 1

## 2017-09-16 MED ORDER — POTASSIUM CHLORIDE 10 MEQ/100ML IV SOLN
10.0000 meq | INTRAVENOUS | Status: DC
  Administered 2017-09-16 (×5): 10 meq via INTRAVENOUS
  Filled 2017-09-16 (×5): qty 100

## 2017-09-16 MED ORDER — POLYVINYL ALCOHOL 1.4 % OP SOLN
1.0000 [drp] | Freq: Three times a day (TID) | OPHTHALMIC | Status: DC
  Administered 2017-09-16 – 2017-09-21 (×13): 1 [drp] via OPHTHALMIC
  Filled 2017-09-16: qty 15

## 2017-09-16 MED ORDER — IOPAMIDOL (ISOVUE-370) INJECTION 76%
50.0000 mL | Freq: Once | INTRAVENOUS | Status: AC | PRN
  Administered 2017-09-16: 50 mL via INTRAVENOUS

## 2017-09-16 MED ORDER — ASPIRIN 325 MG PO TABS: 325.0000 mg | ORAL_TABLET | Freq: Every day | ORAL | Status: DC

## 2017-09-16 MED ORDER — POTASSIUM CHLORIDE 10 MEQ/100ML IV SOLN
10.0000 meq | INTRAVENOUS | Status: DC
  Administered 2017-09-16: 10 meq via INTRAVENOUS
  Filled 2017-09-16 (×2): qty 100

## 2017-09-16 MED ORDER — ACETAMINOPHEN 650 MG RE SUPP: 650.0000 mg | RECTAL | Status: DC | PRN

## 2017-09-16 MED ORDER — LORAZEPAM 2 MG/ML IJ SOLN
0.5000 mg | Freq: Two times a day (BID) | INTRAMUSCULAR | Status: DC
  Administered 2017-09-16 – 2017-09-17 (×2): 0.5 mg via INTRAVENOUS
  Filled 2017-09-16 (×2): qty 1

## 2017-09-16 MED ORDER — GLYCERIN-HYPROMELLOSE-PEG 400 0.2-0.36-1 % OP SOLN: 1.0000 [drp] | Freq: Three times a day (TID) | OPHTHALMIC | Status: DC

## 2017-09-16 MED ORDER — DILTIAZEM HCL-DEXTROSE 100-5 MG/100ML-% IV SOLN (PREMIX)
5.0000 mg/h | INTRAVENOUS | Status: DC
  Administered 2017-09-16 – 2017-09-17 (×3): 7.5 mg/h via INTRAVENOUS
  Filled 2017-09-16 (×4): qty 100

## 2017-09-16 MED ORDER — SODIUM CHLORIDE 0.9 % IV SOLN
INTRAVENOUS | Status: DC
  Administered 2017-09-16 – 2017-09-17 (×2): via INTRAVENOUS
  Administered 2017-09-18 – 2017-09-19 (×2): 1000 mL via INTRAVENOUS
  Administered 2017-09-20 (×2): via INTRAVENOUS

## 2017-09-16 MED ORDER — PROPYLENE GLYCOL-GLYCERIN 1-0.3 % OP SOLN: 1.0000 [drp] | Freq: Three times a day (TID) | OPHTHALMIC | Status: DC

## 2017-09-16 MED ORDER — HEPARIN (PORCINE) IN NACL 100-0.45 UNIT/ML-% IJ SOLN
850.0000 [IU]/h | INTRAMUSCULAR | Status: DC
  Administered 2017-09-16: 600 [IU]/h via INTRAVENOUS
  Administered 2017-09-18: 850 [IU]/h via INTRAVENOUS
  Filled 2017-09-16 (×3): qty 250

## 2017-09-16 MED ORDER — ACETAMINOPHEN 160 MG/5ML PO SOLN: 650.0000 mg | ORAL | Status: DC | PRN

## 2017-09-16 MED ORDER — STROKE: EARLY STAGES OF RECOVERY BOOK
Freq: Once | Status: DC
  Filled 2017-09-16: qty 1

## 2017-09-16 MED ORDER — ACETAMINOPHEN 325 MG PO TABS: 650.0000 mg | ORAL_TABLET | ORAL | Status: DC | PRN

## 2017-09-16 MED ORDER — VANCOMYCIN HCL IN DEXTROSE 1-5 GM/200ML-% IV SOLN
1000.0000 mg | Freq: Once | INTRAVENOUS | Status: AC
  Administered 2017-09-16: 1000 mg via INTRAVENOUS
  Filled 2017-09-16: qty 200

## 2017-09-16 MED ORDER — DILTIAZEM HCL-DEXTROSE 100-5 MG/100ML-% IV SOLN (PREMIX)
5.0000 mg/h | INTRAVENOUS | Status: DC
  Administered 2017-09-16: 5 mg/h via INTRAVENOUS
  Filled 2017-09-16 (×2): qty 100

## 2017-09-16 MED ORDER — DILTIAZEM LOAD VIA INFUSION
20.0000 mg | Freq: Once | INTRAVENOUS | Status: AC
  Administered 2017-09-16: 20 mg via INTRAVENOUS
  Filled 2017-09-16: qty 20

## 2017-09-16 MED ORDER — HYPROMELLOSE (GONIOSCOPIC) 2.5 % OP SOLN
1.0000 [drp] | Freq: Three times a day (TID) | OPHTHALMIC | Status: DC
  Filled 2017-09-16: qty 15

## 2017-09-16 NOTE — ED Triage Notes (Signed)
Per [facility] out [facility] pt due pt having right sided facial droop and aphasia. Pt did have fall yesterday.

## 2017-09-16 NOTE — Progress Notes (Signed)
Pharmacy Antibiotic Note  Raymon Muttonnnie S Skillman is a 84 y.o. female admitted on 09/16/2017 with pneumonia.  Pharmacy has been consulted for vancomycin dosing.  Plan: Vancomycin 1000mg  IV x1 then 500mg  IV every 24 hours.  Goal trough 15-20 mcg/mL. Cefepime 2G IV x1 in the ED  Height: 5\' 7"  (170.2 cm) Weight: 111 lb 12.4 oz (50.7 kg) IBW/kg (Calculated) : 61.6  No data recorded.  Recent Labs  Lab 09/16/17 1233 09/16/17 1240  WBC 9.2  --   CREATININE 1.08* 1.00    Estimated Creatinine Clearance: 29.3 mL/min (by C-G formula based on SCr of 1 mg/dL).    Allergies  Allergen Reactions  . Amlodipine Besylate     REACTION: itching  . Azatadine     Per MAR?  [name] Kitchen. Azathioprine     REACTION: nausea  . Penicillins     REACTION: yeast infection Per MAR  . Sertraline Hcl     REACTION: rash  . Sulfonamide Derivatives     REACTION: swelling and itching    Antimicrobials this admission: Vancomycin 8/14>> Cefepime 8/14>>  Thank you for allowing pharmacy to be a part of this patient's care.  Toniann Failony L Velisa Regnier 09/16/2017 2:35 PM

## 2017-09-16 NOTE — H&P (Addendum)
History and Physical    Marisa Smith:[id number] DOB: 01/03/1925 DOA: 09/16/2017  PCP: Lajean Manes, MD  Patient coming from: [facility]; NOK: Daughter, Vanita Ingles, [phone]  Chief Complaint: stroke-like symptoms  HPI: Marisa Smith is a 84 y.o. female with medical history significant of hypothyroidism; HTN; afib not on AC; and autoimmune hepatitis presenting with "stroke symptoms."   She was admitted 6/24-26 for an acute compression fracture at L1 complicated by hyponatremia.   She has marked aphasia and is unable to effectively communicate, but she does appear to be A&O x 2.  Further history is not able to be obtained at this time.  HPI per Dr. Gilford Raid: Pt presents to the ED today as a stroke alert. The pt has a hx of afib not on anticoagulation. She was lsn at 1100 at her living facility. Nurse practitioner found her with right facial droop and aphasia around noon. Stroke team met pt at the bridge and she went immediately to CT. Pt has a right sided facial droop and is in afib with rvr. The pt did have a mri which showed cva. Pt d/w [facility] for admission   ED Course:  H/o afib, not on AC.  MRI with multifocal acute strokes - R facial droop, dysarthria.  Neurology has seen - can be started on Heparin if appropriate without bolus.  Had RVR, given Dilt.  Possible aspiration PNA, started on antibiotics.  Low K+- repleted.  Review of Systems: Unable to perform   PMH, PSH, FM, and SH were reviewed in Epic  Past Medical History:  Diagnosis Date  . ALLERGIC RHINITIS CAUSE UNSPECIFIED   . ANXIETY   . Autoimmune hepatitis ([place])    on MP6, prev pred  . DEPRESSION, MAJOR, MODERATE   . Headache(784.0) 02/15/2010  . HYPERTENSION, BENIGN ESSENTIAL   . HYPOTHYROIDISM   . OSTEOPOROSIS    fx L prox hum and L distal radius, both requiring ORIF 02/2013  . Unspecified vitamin D deficiency     Past Surgical History:  Procedure Laterality Date  . CATARACT EXTRACTION   2008   Left eye  . CATARACT EXTRACTION  2009   Right eye  . HUMERUS IM NAIL Left 02/2013   DR Rosaland Lao  . HUMERUS IM NAIL Left 02/24/2013   Procedure: INTRAMEDULLARY (IM) NAIL LEFT PROXIMAL  HUMERUS;  Surgeon: Nita Sells, MD;  Location: [place];  Service: Orthopedics;  Laterality: Left;  . IR KYPHO LUMBAR INC FX REDUCE BONE BX UNI/BIL CANNULATION INC/IMAGING  07/28/2017  . OPEN REDUCTION INTERNAL FIXATION (ORIF) DISTAL RADIAL FRACTURE Left 02/16/2013   Procedure: OPEN REDUCTION INTERNAL FIXATION (ORIF) DISTAL RADIAL FRACTURE;  Surgeon: Linna Hoff, MD;  Location: [place];  Service: Orthopedics;  Laterality: Left;  . ORIF RADIAL FRACTURE Right 03/01/2016   Procedure: OPEN REDUCTION INTERNAL FIXATION (ORIF) RADIUS FRACTURE;  Surgeon: Roseanne Kaufman, MD;  Location: [place];  Service: Orthopedics;  Laterality: Right;  . TONSILLECTOMY  1945    Social History   Socioeconomic History  . Marital status: Widowed    Spouse name: Not on file  . Number of children: 1  . Years of education: Not on file  . Highest education level: Not on file  Occupational History    Employer: RETIRED  Social Needs  . Financial resource strain: Not on file  . Food insecurity:    Worry: Not on file    Inability: Not on file  . Transportation needs:    Medical: Not on  file    Non-medical: Not on file  Tobacco Use  . Smoking status: Never Smoker  . Smokeless tobacco: Never Used  . Tobacco comment: Married, Retired- daughter is Arbie Cookey [place] responsible for majority of supervison for parents  Substance and Sexual Activity  . Alcohol use: No    Alcohol/week: 0.0 standard drinks  . Drug use: No  . Sexual activity: Not Currently  Lifestyle  . Physical activity:    Days per week: Not on file    Minutes per session: Not on file  . Stress: Not on file  Relationships  . Social connections:    Talks on phone: Not on file    Gets together: Not on file    Attends religious service: Not on file     Active member of club or organization: Not on file    Attends meetings of clubs or organizations: Not on file    Relationship status: Not on file  . Intimate partner violence:    Fear of current or ex partner: Not on file    Emotionally abused: Not on file    Physically abused: Not on file    Forced sexual activity: Not on file  Other Topics Concern  . Not on file  Social History Narrative  . Not on file    Allergies  Allergen Reactions  . Amlodipine Besylate     REACTION: itching  . Azatadine     Per MAR?  [name] Kitchen Azathioprine     REACTION: nausea  . Penicillins     REACTION: yeast infection Per MAR  . Sertraline Hcl     REACTION: rash  . Sulfonamide Derivatives     REACTION: swelling and itching    Family History  Problem Relation Age of Onset  . Hypertension Mother   . Stroke Mother   . Heart disease Father   . Stroke Sister     Prior to Admission medications   Medication Sig Start Date End Date Taking? Authorizing Provider  acetaminophen (TYLENOL) 325 MG tablet Take 325 mg by mouth every 4 (four) hours as needed for mild pain (right hip pain).    [provider]  acetaminophen (TYLENOL) 500 MG tablet Take 1,000 mg by mouth every 6 (six) hours as needed for mild pain or headache.     [provider]  aspirin EC 81 MG EC tablet Take 81 mg by mouth daily.     [provider]  busPIRone (BUSPAR) 15 MG tablet Take 15 mg by mouth 2 (two) times daily.  12/20/13   [provider]  Calcium Carbonate-Vitamin D 600-200 MG-UNIT CAPS Take 1 capsule by mouth daily.    [provider]  cetirizine (ZYRTEC) 10 MG tablet Take 10 mg by mouth daily.    [provider]  Cholecalciferol (VITAMIN D3) 1000 units CAPS Take 1,000 Units by mouth daily.     [provider]  Eyelid Cleansers (OCUSOFT LID SCRUB) PADS Place 1 each into both eyes 2 (two) times daily.    [provider]  Glycerin-Hypromellose-PEG 400 0.2-0.36-1 %  SOLN Place 1 drop into both eyes 3 (three) times daily.    [provider]  HYDROcodone-acetaminophen (NORCO) 5-325 MG tablet Take 1 tablet by mouth every 6 (six) hours as needed for moderate pain. 03/03/16   Avelina Laine, PA-C  lactulose (CHRONULAC) 10 GM/15ML solution Take 30 mLs by mouth 2 (two) times daily as needed for constipation. 07/24/17   [provider]  levothyroxine (SYNTHROID, [place])  50 MCG tablet Take 1 tablet (50 mcg total) by mouth daily before breakfast. 05/03/13   Rowe Clack, MD  LORazepam (ATIVAN) 0.5 MG tablet Take 0.5 mg by mouth 2 (two) times daily.  12/22/13   [provider]  Menthol, Topical Analgesic, (BIOFREEZE ROLL-ON) 4 % GEL Apply 1 application topically 3 (three) times daily.    [provider]  mercaptopurine (PURINETHOL) 50 MG tablet Take 0.5 tablets (25 mg total) by mouth daily. Give on an empty stomach 1 hour before or 2 hours after meals. Caution: Chemotherapy. 01/05/14   Milus Banister, MD  mirtazapine (REMERON) 45 MG tablet Take 45 mg by mouth at bedtime.  12/20/13   [provider]  ondansetron (ZOFRAN ODT) 8 MG disintegrating tablet Take 1 tablet (8 mg total) by mouth every 8 (eight) hours as needed for nausea or vomiting. 02/05/16   Jola Schmidt, MD  Propylene Glycol-Glycerin (ARTIFICIAL TEARS) 1-0.3 % SOLN Place 1 drop into both eyes 3 (three) times daily.    [provider]  senna (SENOKOT) 8.6 MG TABS tablet Take 1 tablet by mouth daily.    [provider]  verapamil (VERELAN PM) 120 MG 24 hr capsule Take 120 mg by mouth at bedtime.    [provider]    Physical Exam: Vitals:   09/16/17 1406 09/16/17 1421 09/16/17 1430 09/16/17 1506  BP: (!) 138/100 (!) 150/94 (!) 144/72 (!) 152/82  Pulse: 85 85 77 86  Resp: 18 19 (!) 25 (!) 27  SpO2: 94% 96% 96% 98%  Weight:      Height:         General:  Appears calm but aphasic, which is clearly frustrating to her Eyes:   PERRL, EOMI, normal lids, iris ENT:  grossly normal hearing, lips & tongue Neck:  no LAD, masses or thyromegaly; no carotid bruits Cardiovascular:  Afib, rate controlled, no m/r/g. No LE edema.  Respiratory:   CTA bilaterally with no wheezes/rales/rhonchi.  Normal respiratory effort. Abdomen:  soft, NT, ND, NABS Skin:  no rash or induration seen on limited exam Musculoskeletal:  Appears to have RUE weakness but she is not following commands well so deficits are difficult to assess at this time; she is able to move her feet equally bilaterally; no bony abnormality Lower extremity:  No LE edema.  Limited foot exam with no ulcerations.  2+ distal pulses. Psychiatric: blunted mood and affect, speech dysarthric vs. Aphasic, AOx2 Neurologic: R facial droop, appears to have equal sensation in V1-2 distribution but she did not answer about V3 and the neurologic exam was difficult from there.  She does appear to have RUE weakness but wiggles her feet/toes symmetrically    Radiological Exams on Admission: Ct Angio Head W Or Wo Contrast  Result Date: 09/16/2017 CLINICAL DATA:  Focal neuro deficit with stroke suspected EXAM: CT ANGIOGRAPHY HEAD AND NECK TECHNIQUE: Multidetector CT imaging of the head and neck was performed using the standard protocol during bolus administration of intravenous contrast. Multiplanar CT image reconstructions and MIPs were obtained to evaluate the vascular anatomy. Carotid stenosis measurements (when applicable) are obtained utilizing NASCET criteria, using the distal internal carotid diameter as the denominator. CONTRAST:  59m ISOVUE-370 IOPAMIDOL (ISOVUE-370) INJECTION 76% COMPARISON:  Noncontrast head CT earlier today FINDINGS: CTA NECK FINDINGS Aortic arch: Atherosclerotic calcification. The brachiocephalic origin is not covered. Three vessel branching pattern. Right carotid system: No stenosis, beading, or significant atheromatous change. Left carotid system: Minor  atheromatous changes at the bifurcation.  Vessels are smooth and widely patent. Vertebral arteries: Calcified plaque at the proximal subclavian on the left. Focal narrowing of the distal left V2 segment measuring 40%. No dissection or flow limiting stenosis. Skeleton: Degenerative changes without acute or aggressive finding Other neck: No evidence of mass or inflammation Upper chest: Layering pleural effusions partially seen on both sides. There is chronic biapical pleural based scarring. Small subpleural opacity in the right upper lobe, indistinct and likely inflammatory. Review of the MIP images confirms the above findings CTA HEAD FINDINGS Anterior circulation: Mild atherosclerotic plaque on the carotid siphons. No branch occlusion or major vessel flow limiting stenosis. There is atherosclerotic irregularity of the left M3 branch of note given the noncontrast CT findings. Negative for aneurysm. Posterior circulation: Small vertebral and basilar arteries in the setting of fetal type bilateral PCA. No flow limiting stenosis or branch occlusion. Venous sinuses: Patent Anatomic variants: None significant Delayed phase: Not obtained in the emergent setting Review of the MIP images confirms the above findings IMPRESSION: 1. No large vessel occlusion. 2. Overall mild for age atherosclerosis without flow limiting stenosis in the major vessels. 3. Focal atherosclerotic irregularity of a left M3 branch of note given the preceding head CT findings. 4. There are layering pleural effusions. Small focus of airspace disease in the right upper lobe that is indistinct and likely inflammatory. Electronically Signed   By: Monte Fantasia M.D.   On: 09/16/2017 13:17   Ct Angio Neck W Or Wo Contrast  Result Date: 09/16/2017 CLINICAL DATA:  Focal neuro deficit with stroke suspected EXAM: CT ANGIOGRAPHY HEAD AND NECK TECHNIQUE: Multidetector CT imaging of the head and neck was performed using the standard protocol during bolus  administration of intravenous contrast. Multiplanar CT image reconstructions and MIPs were obtained to evaluate the vascular anatomy. Carotid stenosis measurements (when applicable) are obtained utilizing NASCET criteria, using the distal internal carotid diameter as the denominator. CONTRAST:  41m ISOVUE-370 IOPAMIDOL (ISOVUE-370) INJECTION 76% COMPARISON:  Noncontrast head CT earlier today FINDINGS: CTA NECK FINDINGS Aortic arch: Atherosclerotic calcification. The brachiocephalic origin is not covered. Three vessel branching pattern. Right carotid system: No stenosis, beading, or significant atheromatous change. Left carotid system: Minor atheromatous changes at the bifurcation. Vessels are smooth and widely patent. Vertebral arteries: Calcified plaque at the proximal subclavian on the left. Focal narrowing of the distal left V2 segment measuring 40%. No dissection or flow limiting stenosis. Skeleton: Degenerative changes without acute or aggressive finding Other neck: No evidence of mass or inflammation Upper chest: Layering pleural effusions partially seen on both sides. There is chronic biapical pleural based scarring. Small subpleural opacity in the right upper lobe, indistinct and likely inflammatory. Review of the MIP images confirms the above findings CTA HEAD FINDINGS Anterior circulation: Mild atherosclerotic plaque on the carotid siphons. No branch occlusion or major vessel flow limiting stenosis. There is atherosclerotic irregularity of the left M3 branch of note given the noncontrast CT findings. Negative for aneurysm. Posterior circulation: Small vertebral and basilar arteries in the setting of fetal type bilateral PCA. No flow limiting stenosis or branch occlusion. Venous sinuses: Patent Anatomic variants: None significant Delayed phase: Not obtained in the emergent setting Review of the MIP images confirms the above findings IMPRESSION: 1. No large vessel occlusion. 2. Overall mild for age  atherosclerosis without flow limiting stenosis in the major vessels. 3. Focal atherosclerotic irregularity of a left M3 branch of note given the preceding head CT findings. 4. There are layering pleural effusions. Small focus of  airspace disease in the right upper lobe that is indistinct and likely inflammatory. Electronically Signed   By: Monte Fantasia M.D.   On: 09/16/2017 13:17   Mr Brain Wo Contrast  Result Date: 09/16/2017 CLINICAL DATA:  Right facial droop and aphasia. EXAM: MRI HEAD WITHOUT CONTRAST TECHNIQUE: Multiplanar, multiecho pulse sequences of the brain and surrounding structures were obtained without intravenous contrast. COMPARISON:  Head CT 09/16/2017 FINDINGS: BRAIN: Multifocal acute ischemia within the left hemisphere, including the left frontal white matter, base of the left precentral gyrus and posterior left insula. There is an additional focus in the posterior left temporal lobe. There is also a focus of hyperintensity on diffusion-weighted imaging in the right cerebellar hemisphere without corresponding abnormality on the ADC map. The midline structures are normal. There are old bilateral cerebellar infarcts. Diffuse confluent hyperintense T2-weighted signal within the periventricular, deep and juxtacortical white matter, most commonly due to chronic ischemic microangiopathy. Generalized atrophy without lobar predilection. Susceptibility-sensitive sequences show no chronic microhemorrhage or superficial siderosis. VASCULAR: Major intracranial arterial and venous sinus flow voids are preserved. SKULL AND UPPER CERVICAL SPINE: The visualized skull base, calvarium, upper cervical spine and extracranial soft tissues are normal. SINUSES/ORBITS: Left mastoid effusion. Paranasal sinuses are clear. There are bilateral lens replacements. IMPRESSION: 1. Multiple foci of acute ischemia within the left hemisphere with the largest lesions located in the left frontal lobe. No hemorrhage or mass  effect. 2. Punctate subacute right cerebellar infarct suspected on the basis of DWI hyperintensity without corresponding decrease of apparent diffusion coefficient. 3. Multiple old cerebellar infarcts and advanced chronic ischemic microangiopathy. Electronically Signed   By: Ulyses Jarred M.D.   On: 09/16/2017 14:23   Dg Chest Portable 1 View  Result Date: 09/16/2017 CLINICAL DATA:  Initial evaluation for acute shortness of breath. EXAM: PORTABLE CHEST 1 VIEW COMPARISON:  Prior CT from 11/30/2016 FINDINGS: Mild cardiomegaly, stable. Mediastinal silhouette within normal limits. Tortuosity the intrathoracic aorta noted. Lungs mildly hypoinflated. Moderate layering left pleural effusion. Associated left basilar opacity may reflect atelectasis or infiltrate. Perihilar vascular congestion without overt pulmonary edema. No pneumothorax. No acute osseus abnormality. Diffuse osteopenia. Prior ORIF at the proximal left humerus. IMPRESSION: 1. Moderate layering left pleural effusion. Associated left basilar opacity likely atelectasis, although infiltrate could be considered in the correct clinical setting. 2. Cardiomegaly with mild diffuse pulmonary vascular congestion. Electronically Signed   By: Jeannine Boga M.D.   On: 09/16/2017 13:19   Ct Head Code Stroke Wo Contrast  Result Date: 09/16/2017 CLINICAL DATA:  Code stroke. Unexplained altered level of consciousness. Right-sided facial paralysis EXAM: CT HEAD WITHOUT CONTRAST TECHNIQUE: Contiguous axial images were obtained from the base of the skull through the vertex without intravenous contrast. COMPARISON:  02/05/2016 FINDINGS: Brain: Small area of age-indeterminate infarction in the high left frontal cortex. Small area of age-indeterminate infarction in the right superior cerebellum (just above a smaller and chronic infarct in the right cerebellum seen previously). Small remote left cerebellar infarct. Confluent chronic small vessel ischemic gliosis in  the cerebral white matter. No acute hemorrhage, hydrocephalus, or masslike finding Vascular: No high-density vessel.  Atherosclerotic calcification. Skull: Negative Sinuses/Orbits: Bilateral cataract resection Other: These results were called by telephone at the time of interpretation on 09/16/2017 at 12:46 pm to Dr. Rory Percy , who was already aware of findings. ASPECTS [facility] Stroke Program Early CT Score) - Ganglionic level infarction (caudate, lentiform nuclei, internal capsule, insula, M1-M3 cortex): 6 - Supraganglionic infarction (M4-M6 cortex): 3 Total score (0-10 with 10  being normal): 9 IMPRESSION: 1. Small age-indeterminate infarct in the left frontal cortex. ASPECTS is 9. 2. Small age-indeterminate right superior cerebellar infarct. 3. Chronic small vessel ischemia. Electronically Signed   By: Monte Fantasia M.D.   On: 09/16/2017 12:49    EKG: Independently reviewed.  Wide complex tachycardia with rate 149; nonspecific ST changes that may be rate-related  Labs on Admission: I have personally reviewed the available labs and imaging studies at the time of the admission.  Pertinent labs:   K+ 2.7 BUN 19/Creatinine 1.08/GFR 50; prior 11/0.61/>60 on 6/26 Normal CBC Troponin 0.04 INR 1.07 ETOH <10  Assessment/Plan Active Problems:   Hypothyroidism   HYPERTENSION, BENIGN ESSENTIAL   A-fib ([facility])   CVA (cerebral vascular accident) ([place])   AKI (acute kidney injury) ([place])   CVA -Patient with h/o afib not on AC now presenting with acute R facial droop and aphasia -Concerning for TIA/CVA -MRI/CTA confirms multiple foci of acute ischemia within the left hemisphere with the largest lesions located in the left frontal lobe.  Also with punctate subacute right cerebellar infarct and multiple old cerebellar infarcts. -Will admit for CVA evaluation -Telemetry monitoring -Echo -Risk stratification with FLP; will also check TSH/free T4 -ASA daily -PT/OT/ST/Nutrition Consults -SW consult for  placement -There was some question raised about the possibility of aspiration PNA, but she does not have apparent s/sx of infection at this time; will hold further antibiotics at this time and monitor clinically  HTN -Allow permissive HTN -Treat BP only if >220/120, and then with goal of 15% reduction -Hold Verapamil and plan to restart in 48-72 hours  HLD -Check FLP -Will plan to start statin therapy empirically once patient passes swallow evaluation  Afib, not on AC -Rate controlled at home on Verapamil -For now, admit to SDU on Cardizem drip; will not transition to PO until patient can safely swallow PO medications -This is likely the source of her multifocal embolic strokes -Will initiate heparin at this time - stroke protocol without bolus, as per neurology recommendations; she does have a risk of reperfusion bleeding, but given known afib with multiple embolic strokes, initiation of AC seems most appropriate at this time -She will need long-term NOAC therapy  AKI -Baseline creatinine 0.6, currently 1.08 -Will gently hydrate and follow  Hypothyroidism -Check TSH/free T4 -Continue Synthroid at current dose for now   DVT prophylaxis: Heparin Code Status:  DNR - gold paper present at bedside Family Communication: Called and spoke with the patient's daughter.  She was overwrought and repeatedly asked if she had had a stroke.  She is planning to come and see the patient. Disposition Plan:  SNF once clinically improved Consults called: Neurology; PT/OT/ST/Nutrition Admission status: Admit - It is my clinical opinion that admission to INPATIENT is reasonable and necessary because of the expectation that this patient will require hospital care that crosses at least 2 midnights to treat this condition based on the medical complexity of the problems presented.  Given the aforementioned information, the predictability of an adverse outcome is felt to be significant.    Karmen Bongo  MD [facility]  If note is complete, please contact covering daytime or nighttime physician. [email or web address] Password [facility]  09/16/2017, 3:41 PM

## 2017-09-16 NOTE — ED Notes (Signed)
X-ray at bedside

## 2017-09-16 NOTE — ED Provider Notes (Signed)
[facility] EMERGENCY DEPARTMENT Provider Note   CSN: [id number] Arrival date & time: 09/16/17  1232     History   Chief Complaint No chief complaint on file.   HPI Marisa Smith is a 84 y.o. female.  The history is provided by the nursing home and medical records. The history is limited by the condition of the patient. No language interpreter was used.     84 year old female with history of hypertension, hypothyroidism, recurrent headache, autoimmune hepatitis, atrial fibrillation not on anticoagulant brought here via EMS for stroke symptoms.  History is limited from patient.  History obtained through nursing note, and EMS.  Does report the patient had a fall yesterday.  She also found to be aphasic. Level V caveats applies.   Past Medical History:  Diagnosis Date  . ALLERGIC RHINITIS CAUSE UNSPECIFIED   . ANXIETY   . Autoimmune hepatitis (HCC)    on MP6, prev pred  . DEPRESSION, MAJOR, MODERATE   . Headache(784.0) 02/15/2010  . HYPERTENSION, BENIGN ESSENTIAL   . HYPOTHYROIDISM   . OSTEOPOROSIS    fx L prox hum and L distal radius, both requiring ORIF 02/2013  . Unspecified vitamin D deficiency     Patient Active Problem List   Diagnosis Date Noted  . Compression fracture of L1 lumbar vertebra (HCC) 07/28/2017  . Closed compression fracture of L1 lumbar vertebra, initial encounter (HCC) 07/27/2017  . Hydronephrosis of left kidney 07/27/2017  . A-fib (HCC) 07/27/2017  . Mass of left kidney 07/27/2017  . Wrist fracture 02/29/2016  . Distal radius fracture, right 02/29/2016  . Proximal left humerus fracture 02/22/2013  . Radius fracture 02/16/2013  . Bradycardia 12/06/2011  . Hyponatremia 11/24/2011  . Hyperglycemia 11/24/2011  . Autoimmune hepatitis (HCC)   . Headache(784.0) 02/15/2010  . TREMOR 02/02/2010  . ANXIETY 12/11/2009  . DEPRESSION, MAJOR, MODERATE 10/19/2009  . Hypothyroidism 04/26/2009  . Unspecified vitamin D deficiency 04/26/2009    . HYPERTENSION, BENIGN ESSENTIAL 04/26/2009  . OSTEOPOROSIS 04/26/2009    Past Surgical History:  Procedure Laterality Date  . CATARACT EXTRACTION  2008   Left eye  . CATARACT EXTRACTION  2009   Right eye  . HUMERUS IM NAIL Left 02/2013   DR Sol Blazing  . HUMERUS IM NAIL Left 02/24/2013   Procedure: INTRAMEDULLARY (IM) NAIL LEFT PROXIMAL  HUMERUS;  Surgeon: Mable Paris, MD;  Location: [facility] OR;  Service: Orthopedics;  Laterality: Left;  . IR KYPHO LUMBAR INC FX REDUCE BONE BX UNI/BIL CANNULATION INC/IMAGING  07/28/2017  . OPEN REDUCTION INTERNAL FIXATION (ORIF) DISTAL RADIAL FRACTURE Left 02/16/2013   Procedure: OPEN REDUCTION INTERNAL FIXATION (ORIF) DISTAL RADIAL FRACTURE;  Surgeon: Sharma Covert, MD;  Location: [facility] OR;  Service: Orthopedics;  Laterality: Left;  . ORIF RADIAL FRACTURE Right 03/01/2016   Procedure: OPEN REDUCTION INTERNAL FIXATION (ORIF) RADIUS FRACTURE;  Surgeon: Dominica Severin, MD;  Location: [facility] OR;  Service: Orthopedics;  Laterality: Right;  . TONSILLECTOMY  1945     OB History    Gravida      Para      Term      Preterm      AB      Living  1     SAB      TAB      Ectopic      Multiple      Live Births               Home Medications  Prior to Admission medications   Medication Sig Start Date End Date Taking? Authorizing Provider  acetaminophen (TYLENOL) 325 MG tablet Take 325 mg by mouth every 4 (four) hours as needed for mild pain (right hip pain).    [provider]  acetaminophen (TYLENOL) 500 MG tablet Take 1,000 mg by mouth every 6 (six) hours as needed for mild pain or headache.     [provider]  aspirin EC 81 MG EC tablet Take 81 mg by mouth daily.     [provider]  busPIRone (BUSPAR) 15 MG tablet Take 15 mg by mouth 2 (two) times daily.  12/20/13   [provider]  Calcium Carbonate-Vitamin D 600-200 MG-UNIT CAPS Take 1 capsule by mouth daily.    [provider]   cetirizine (ZYRTEC) 10 MG tablet Take 10 mg by mouth daily.    [provider]  Cholecalciferol (VITAMIN D3) 1000 units CAPS Take 1,000 Units by mouth daily.     [provider]  Eyelid Cleansers (OCUSOFT LID SCRUB) PADS Place 1 each into both eyes 2 (two) times daily.    [provider]  Glycerin-Hypromellose-PEG 400 0.2-0.36-1 % SOLN Place 1 drop into both eyes 3 (three) times daily.    [provider]  HYDROcodone-acetaminophen (NORCO) 5-325 MG tablet Take 1 tablet by mouth every 6 (six) hours as needed for moderate pain. 03/03/16   Karie Chimera, PA-C  lactulose (CHRONULAC) 10 GM/15ML solution Take 30 mLs by mouth 2 (two) times daily as needed for constipation. 07/24/17   [provider]  levothyroxine (SYNTHROID, LEVOTHROID) 50 MCG tablet Take 1 tablet (50 mcg total) by mouth daily before breakfast. 05/03/13   Newt Lukes, MD  LORazepam (ATIVAN) 0.5 MG tablet Take 0.5 mg by mouth 2 (two) times daily.  12/22/13   [provider]  Menthol, Topical Analgesic, (BIOFREEZE ROLL-ON) 4 % GEL Apply 1 application topically 3 (three) times daily.    [provider]  mercaptopurine (PURINETHOL) 50 MG tablet Take 0.5 tablets (25 mg total) by mouth daily. Give on an empty stomach 1 hour before or 2 hours after meals. Caution: Chemotherapy. 01/05/14   Rachael Fee, MD  mirtazapine (REMERON) 45 MG tablet Take 45 mg by mouth at bedtime.  12/20/13   [provider]  ondansetron (ZOFRAN ODT) 8 MG disintegrating tablet Take 1 tablet (8 mg total) by mouth every 8 (eight) hours as needed for nausea or vomiting. 02/05/16   Azalia Bilis, MD  Propylene Glycol-Glycerin (ARTIFICIAL TEARS) 1-0.3 % SOLN Place 1 drop into both eyes 3 (three) times daily.    [provider]  senna (SENOKOT) 8.6 MG TABS tablet Take 1 tablet by mouth daily.    [provider]  verapamil (VERELAN PM) 120 MG 24 hr capsule Take 120 mg by mouth at  bedtime.    [provider]    Family History Family History  Problem Relation Age of Onset  . Hypertension Mother   . Stroke Mother   . Heart disease Father   . Stroke Sister     Social History Social History   Tobacco Use  . Smoking status: Never Smoker  . Smokeless tobacco: Never Used  . 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 Alcohol use: No    Alcohol/week: 0.0 standard drinks  . Drug use: No     Allergies   Amlodipine besylate; Azatadine; Azathioprine;  Penicillins; Sertraline hcl; and Sulfonamide derivatives   Review of Systems Review of Systems  Unable to perform ROS: Mental status change     Physical Exam Updated Vital Signs Ht 5\' 7"  (1.702 m)   Wt 50.7 kg   BMI 17.51 kg/m   Physical Exam  Constitutional:  Frail appearing elderly female laying in bed in no acute discomfort.  HENT:  Mouth is dry.  Eyes: Pupils are equal, round, and reactive to light. EOM are normal.  Neck:  No nuchal rigidity  Cardiovascular:  Irregularly irregular without murmur rubs or gallops  Pulmonary/Chest:  Poor effort, difficult to assess lung sounds but no obvious wheeze, rales or rhonchi  Abdominal: Soft. She exhibits no distension. There is no tenderness.  Neurological: A cranial nerve deficit is present. No sensory deficit. GCS eye subscore is 3. GCS verbal subscore is 3. GCS motor subscore is 6.  Patient appears lethargic but able to follow simple command.  Able to move all 4 extremities with poor effort.  Mild right-sided facial droop.  She is dysarthric.  Gait and coordination not tested.  No obvious arm drift.  Nursing note and vitals reviewed.    ED Treatments / Results  Labs (all labs ordered are listed, but only abnormal results are displayed) Labs Reviewed  COMPREHENSIVE METABOLIC PANEL - Abnormal; Notable for the following components:      Result  Value   Sodium 134 (*)    Potassium 2.7 (*)    Chloride 94 (*)    Glucose, Bld 102 (*)    Creatinine, Ser 1.08 (*)    Albumin 3.2 (*)    Total Bilirubin 1.3 (*)    GFR calc non Af Amer 44 (*)    GFR calc Af Amer 50 (*)    All other components within normal limits  I-STAT CHEM 8, ED - Abnormal; Notable for the following components:   Sodium 134 (*)    Potassium 2.7 (*)    Chloride 94 (*)    Calcium, Ion 1.10 (*)    All other components within normal limits  ETHANOL  PROTIME-INR  APTT  CBC  DIFFERENTIAL  RAPID URINE DRUG SCREEN, HOSP PERFORMED  URINALYSIS, ROUTINE W REFLEX MICROSCOPIC  TSH  MAGNESIUM  PHOSPHORUS  I-STAT TROPONIN, ED  CBG MONITORING, ED    EKG EKG Interpretation  Date/Time:  Wednesday September 16 2017 12:59:49 EDT Ventricular Rate:  148 PR Interval:    QRS Duration: 129 QT Interval:  361 QTC Calculation: 567 R Axis:   109 Text Interpretation:  Wide-QRS tachycardia Sinus pause RBBB and LPFB Since last tracing rate faster Confirmed by Jacalyn Lefevre [id number]) on 09/16/2017 1:14:48 PM   Radiology Ct Angio Head W Or Wo Contrast  Result Date: 09/16/2017 CLINICAL DATA:  Focal neuro deficit with stroke suspected EXAM: CT ANGIOGRAPHY HEAD AND NECK TECHNIQUE: Multidetector CT imaging of the head and neck was performed using the standard protocol during bolus administration of intravenous contrast. Multiplanar CT image reconstructions and MIPs were obtained to evaluate the vascular anatomy. Carotid stenosis measurements (when applicable) are obtained utilizing NASCET criteria, using the distal internal carotid diameter as the denominator. CONTRAST:  50mL ISOVUE-370 IOPAMIDOL (ISOVUE-370) INJECTION 76% COMPARISON:  Noncontrast head CT earlier today FINDINGS: CTA NECK FINDINGS Aortic arch: Atherosclerotic calcification. The brachiocephalic origin is not covered. Three vessel branching pattern. Right carotid system: No stenosis, beading, or significant atheromatous change.  Left carotid system: Minor atheromatous changes at the bifurcation. Vessels are smooth and widely patent. Vertebral arteries:  Calcified plaque at the proximal subclavian on the left. Focal narrowing of the distal left V2 segment measuring 40%. No dissection or flow limiting stenosis. Skeleton: Degenerative changes without acute or aggressive finding Other neck: No evidence of mass or inflammation Upper chest: Layering pleural effusions partially seen on both sides. There is chronic biapical pleural based scarring. Small subpleural opacity in the right upper lobe, indistinct and likely inflammatory. Review of the MIP images confirms the above findings CTA HEAD FINDINGS Anterior circulation: Mild atherosclerotic plaque on the carotid siphons. No branch occlusion or major vessel flow limiting stenosis. There is atherosclerotic irregularity of the left M3 branch of note given the noncontrast CT findings. Negative for aneurysm. Posterior circulation: Small vertebral and basilar arteries in the setting of fetal type bilateral PCA. No flow limiting stenosis or branch occlusion. Venous sinuses: Patent Anatomic variants: None significant Delayed phase: Not obtained in the emergent setting Review of the MIP images confirms the above findings IMPRESSION: 1. No large vessel occlusion. 2. Overall mild for age atherosclerosis without flow limiting stenosis in the major vessels. 3. Focal atherosclerotic irregularity of a left M3 branch of note given the preceding head CT findings. 4. There are layering pleural effusions. Small focus of airspace disease in the right upper lobe that is indistinct and likely inflammatory. Electronically Signed   By: Marnee SpringJonathon  Watts M.D.   On: 09/16/2017 13:17   Ct Angio Neck W Or Wo Contrast  Result Date: 09/16/2017 CLINICAL DATA:  Focal neuro deficit with stroke suspected EXAM: CT ANGIOGRAPHY HEAD AND NECK TECHNIQUE: Multidetector CT imaging of the head and neck was performed using the standard  protocol during bolus administration of intravenous contrast. Multiplanar CT image reconstructions and MIPs were obtained to evaluate the vascular anatomy. Carotid stenosis measurements (when applicable) are obtained utilizing NASCET criteria, using the distal internal carotid diameter as the denominator. CONTRAST:  50mL ISOVUE-370 IOPAMIDOL (ISOVUE-370) INJECTION 76% COMPARISON:  Noncontrast head CT earlier today FINDINGS: CTA NECK FINDINGS Aortic arch: Atherosclerotic calcification. The brachiocephalic origin is not covered. Three vessel branching pattern. Right carotid system: No stenosis, beading, or significant atheromatous change. Left carotid system: Minor atheromatous changes at the bifurcation. Vessels are smooth and widely patent. Vertebral arteries: Calcified plaque at the proximal subclavian on the left. Focal narrowing of the distal left V2 segment measuring 40%. No dissection or flow limiting stenosis. Skeleton: Degenerative changes without acute or aggressive finding Other neck: No evidence of mass or inflammation Upper chest: Layering pleural effusions partially seen on both sides. There is chronic biapical pleural based scarring. Small subpleural opacity in the right upper lobe, indistinct and likely inflammatory. Review of the MIP images confirms the above findings CTA HEAD FINDINGS Anterior circulation: Mild atherosclerotic plaque on the carotid siphons. No branch occlusion or major vessel flow limiting stenosis. There is atherosclerotic irregularity of the left M3 branch of note given the noncontrast CT findings. Negative for aneurysm. Posterior circulation: Small vertebral and basilar arteries in the setting of fetal type bilateral PCA. No flow limiting stenosis or branch occlusion. Venous sinuses: Patent Anatomic variants: None significant Delayed phase: Not obtained in the emergent setting Review of the MIP images confirms the above findings IMPRESSION: 1. No large vessel occlusion. 2. Overall  mild for age atherosclerosis without flow limiting stenosis in the major vessels. 3. Focal atherosclerotic irregularity of a left M3 branch of note given the preceding head CT findings. 4. There are layering pleural effusions. Small focus of airspace disease in the right upper lobe that  is indistinct and likely inflammatory. Electronically Signed   By: Marnee Spring M.D.   On: 09/16/2017 13:17   Mr Brain Wo Contrast  Result Date: 09/16/2017 CLINICAL DATA:  Right facial droop and aphasia. EXAM: MRI HEAD WITHOUT CONTRAST TECHNIQUE: Multiplanar, multiecho pulse sequences of the brain and surrounding structures were obtained without intravenous contrast. COMPARISON:  Head CT 09/16/2017 FINDINGS: BRAIN: Multifocal acute ischemia within the left hemisphere, including the left frontal white matter, base of the left precentral gyrus and posterior left insula. There is an additional focus in the posterior left temporal lobe. There is also a focus of hyperintensity on diffusion-weighted imaging in the right cerebellar hemisphere without corresponding abnormality on the ADC map. The midline structures are normal. There are old bilateral cerebellar infarcts. Diffuse confluent hyperintense T2-weighted signal within the periventricular, deep and juxtacortical white matter, most commonly due to chronic ischemic microangiopathy. Generalized atrophy without lobar predilection. Susceptibility-sensitive sequences show no chronic microhemorrhage or superficial siderosis. VASCULAR: Major intracranial arterial and venous sinus flow voids are preserved. SKULL AND UPPER CERVICAL SPINE: The visualized skull base, calvarium, upper cervical spine and extracranial soft tissues are normal. SINUSES/ORBITS: Left mastoid effusion. Paranasal sinuses are clear. There are bilateral lens replacements. IMPRESSION: 1. Multiple foci of acute ischemia within the left hemisphere with the largest lesions located in the left frontal lobe. No hemorrhage  or mass effect. 2. Punctate subacute right cerebellar infarct suspected on the basis of DWI hyperintensity without corresponding decrease of apparent diffusion coefficient. 3. Multiple old cerebellar infarcts and advanced chronic ischemic microangiopathy. Electronically Signed   By: Deatra Robinson M.D.   On: 09/16/2017 14:23   Dg Chest Portable 1 View  Result Date: 09/16/2017 CLINICAL DATA:  Initial evaluation for acute shortness of breath. EXAM: PORTABLE CHEST 1 VIEW COMPARISON:  Prior CT from 11/30/2016 FINDINGS: Mild cardiomegaly, stable. Mediastinal silhouette within normal limits. Tortuosity the intrathoracic aorta noted. Lungs mildly hypoinflated. Moderate layering left pleural effusion. Associated left basilar opacity may reflect atelectasis or infiltrate. Perihilar vascular congestion without overt pulmonary edema. No pneumothorax. No acute osseus abnormality. Diffuse osteopenia. Prior ORIF at the proximal left humerus. IMPRESSION: 1. Moderate layering left pleural effusion. Associated left basilar opacity likely atelectasis, although infiltrate could be considered in the correct clinical setting. 2. Cardiomegaly with mild diffuse pulmonary vascular congestion. Electronically Signed   By: Rise Mu M.D.   On: 09/16/2017 13:19   Ct Head Code Stroke Wo Contrast  Result Date: 09/16/2017 CLINICAL DATA:  Code stroke. Unexplained altered level of consciousness. Right-sided facial paralysis EXAM: CT HEAD WITHOUT CONTRAST TECHNIQUE: Contiguous axial images were obtained from the base of the skull through the vertex without intravenous contrast. COMPARISON:  02/05/2016 FINDINGS: Brain: Small area of age-indeterminate infarction in the high left frontal cortex. Small area of age-indeterminate infarction in the right superior cerebellum (just above a smaller and chronic infarct in the right cerebellum seen previously). Small remote left cerebellar infarct. Confluent chronic small vessel ischemic  gliosis in the cerebral white matter. No acute hemorrhage, hydrocephalus, or masslike finding Vascular: No high-density vessel.  Atherosclerotic calcification. Skull: Negative Sinuses/Orbits: Bilateral cataract resection Other: These results were called by telephone at the time of interpretation on 09/16/2017 at 12:46 pm to Dr. Wilford Corner , who was already aware of findings. ASPECTS [facility] Stroke Program Early CT Score) - Ganglionic level infarction (caudate, lentiform nuclei, internal capsule, insula, M1-M3 cortex): 6 - Supraganglionic infarction (M4-M6 cortex): 3 Total score (0-10 with 10 being normal): 9 IMPRESSION: 1. Small age-indeterminate infarct  in the left frontal cortex. ASPECTS is 9. 2. Small age-indeterminate right superior cerebellar infarct. 3. Chronic small vessel ischemia. Electronically Signed   By: Marnee SpringJonathon  Watts M.D.   On: 09/16/2017 12:49    Procedures .Critical Care Performed by: Fayrene Helperran, Ennis Delpozo, PA-C Authorized by: Fayrene Helperran, Tanajah Boulter, PA-C   Critical care provider statement:    Critical care time (minutes):  45   Critical care was time spent personally by me on the following activities:  Discussions with consultants, evaluation of patient's response to treatment, examination of patient, ordering and performing treatments and interventions, ordering and review of laboratory studies, ordering and review of radiographic studies, pulse oximetry, re-evaluation of patient's condition, obtaining history from patient or surrogate and review of old charts   (including critical care time)  Medications Ordered in ED Medications  diltiazem (CARDIZEM) 1 mg/mL load via infusion 20 mg (20 mg Intravenous Bolus from Bag 09/16/17 1307)    And  diltiazem (CARDIZEM) 100 mg in dextrose 5% 100mL (1 mg/mL) infusion (5 mg/hr Intravenous New Bag/Given 09/16/17 1306)  potassium chloride 10 mEq in 100 mL IVPB (has no administration in time range)  ceFEPIme (MAXIPIME) 2 g in sodium chloride 0.9 % 100 mL IVPB (has no  administration in time range)  iopamidol (ISOVUE-370) 76 % injection 50 mL (50 mLs Intravenous Contrast Given 09/16/17 1245)     Initial Impression / Assessment and Plan / ED Course  I have reviewed the triage vital signs and the nursing notes.  Pertinent labs & imaging results that were available during my care of the patient were reviewed by me and considered in my medical decision making (see chart for details).     BP (!) 150/94   Pulse 85   Resp 19   Ht 5\' 7"  (1.702 m)   Wt 50.7 kg   SpO2 96%   BMI 17.51 kg/m    Final Clinical Impressions(s) / ED Diagnoses   Final diagnoses:  Acute ischemic stroke (HCC)  Aspiration pneumonia due to vomit, unspecified laterality, unspecified part of lung (HCC)  Hypokalemia  Atrial fibrillation with RVR [facility](HCC)    ED Discharge Orders    None     1:08 PM Patient brought here from [facility] for potential stroke symptoms.  It was reported that she was having right-sided facial droop and possible aspiration.  She does have history of atrial fibrillation not on anticoagulants and was found to be tachycardic upon arrival.  On-call neurologist, Dr. Jerrell BelfastAurora has initially evaluated patient.  Her last normal was approximately 2 hours ago.  However, it was felt that TPA should not initiated at this time until brain MRI can confirm acute stroke.  Patient in A. fib with RVR heart rate fluctuating between 90s to 150.  Does have history of A. fib not on anticoagulants.  Cardizem IV drip were initiated.  Chest x-ray ordered to rule out aspiration pneumonia.  Initial head CT scan shows age-indeterminate left frontal cortex infarct.  Small age-indeterminate right superior cerebellar infarct.  2:28 PM A. fib with RVR is rate controlled with Cardizem drip.  Patient was stable enough to go for brain MRI.  MRI results show multiple foci of subacute ischemia within the left hemisphere with the largest lesion located in the left frontal lobe.  This finding is  supported by patient's right-sided facial droop and dysphasia.  Will consult neurology for recommendation.  Patient also found to be hypokalemic with a potassium of 2.7.  She did not pass a swallow stroke  screen.  Will give IV supplementation.  CXR shows moderate layering left pleural effusion with associated left basilar opacity likely atelectasis but infiltrates could be considered.  Since pt has increased risk of aspiration pneumonia, will initiate abx to cover for HCAP due to living in a nursing facility.  Care discussed with Dr. Particia Nearing.   2:42 PM I have reached out to neurologist, Dr. Jerrell Belfast to update on MRI results.  He agrees with medicine admission.  He mentioned that medicine can initiate no bolus stroke protocol and can decide if patient would need heparin.  Since patient is now rate control, he does not think will require immediate anticoagulation but if, from a cardiac standpoint if patient needs anticoagulation, it can be started with the stroke protocol.  2:58 PM Appreciate consultation from [facility] Dr. Ophelia Charter who agrees to see and admit pt for further management of her condition.    Fayrene Helper, PA-C 09/16/17 1459    Jacalyn Lefevre, MD 09/16/17 [phone]

## 2017-09-16 NOTE — Consult Note (Addendum)
Neurology Consultation  Reason for Consult: Acute code stroke Referring Physician: Dr. Particia NearingHaviland  CC: Right facial droop, right-sided weakness, aphasia  History is obtained from: EMS, chart.  Attempted to reach patient's daughter by phone listed in the chart but was unable to reach her.  HPI: Marisa Smith is a 84 y.o. female past medical history of atrial fibrillation not on anticoagulation, hypothyroidism, hypertension, anxiety and depression, who was in a nursing facility, last seen normal at 11 AM when the nurse practitioner checking on her around noon noted that she had right facial droop and was aphasic. Patient was not able to provide much of the history and it was obtained from EMS. She was recently admitted to the hospital for treatment of lower back pain secondary to acute L1 compression fracture status post L1 kyphoplasty, hyponatremia, left-sided hydronephrosis. EMS informed us that at baseline she is able to communicate but mumbles her words.  EMS also reported possible subtle right-sided weakness at baseline but were not able to provide any history of prior strokes that they were made aware of.  LKW: 11 AM on 09/16/2017 tpa given?: no, mild symptoms, baseline unknown Premorbid modified Rankin scale (mRS): 3-4   ROS:  Unable to obtain due to altered mental status.   Past Medical History:  Diagnosis Date  . ALLERGIC RHINITIS CAUSE UNSPECIFIED   . ANXIETY   . Autoimmune hepatitis (HCC)    on MP6, prev pred  . DEPRESSION, MAJOR, MODERATE   . Headache(784.0) 02/15/2010  . HYPERTENSION, BENIGN ESSENTIAL   . HYPOTHYROIDISM   . OSTEOPOROSIS    fx L prox hum and L distal radius, both requiring ORIF 02/2013  . Unspecified vitamin D deficiency     Family History  Problem Relation Age of Onset  . Hypertension Mother   . Stroke Mother   . Heart disease Father   . Stroke Sister    social History:   reports that she has never smoked. She has never used smokeless tobacco. She  reports that she does not drink alcohol or use drugs.  Medications  Current Facility-Administered Medications:  .  [COMPLETED] diltiazem (CARDIZEM) 1 mg/mL load via infusion 20 mg, 20 mg, Intravenous, Once, 20 mg at 09/16/17 1307 **AND** diltiazem (CARDIZEM) 100 mg in dextrose 5% 100mL (1 mg/mL) infusion, 5-15 mg/hr, Intravenous, Continuous, Jacalyn LefevreHaviland, Julie, MD, Last Rate: 5 mL/hr at 09/16/17 1306, 5 mg/hr at 09/16/17 1306  Current Outpatient Medications:  .  acetaminophen (TYLENOL) 325 MG tablet, Take 325 mg by mouth every 4 (four) hours as needed for mild pain (right hip pain)., Disp: , Rfl:  .  acetaminophen (TYLENOL) 500 MG tablet, Take 1,000 mg by mouth every 6 (six) hours as needed for mild pain or headache. , Disp: , Rfl:  .  aspirin EC 81 MG EC tablet, Take 81 mg by mouth daily. , Disp: , Rfl:  .  busPIRone (BUSPAR) 15 MG tablet, Take 15 mg by mouth 2 (two) times daily. , Disp: , Rfl: 10 .  Calcium Carbonate-Vitamin D 600-200 MG-UNIT CAPS, Take 1 capsule by mouth daily., Disp: , Rfl:  .  cetirizine (ZYRTEC) 10 MG tablet, Take 10 mg by mouth daily., Disp: , Rfl:  .  Cholecalciferol (VITAMIN D3) 1000 units CAPS, Take 1,000 Units by mouth daily. , Disp: , Rfl:  .  Eyelid Cleansers (OCUSOFT LID SCRUB) PADS, Place 1 each into both eyes 2 (two) times daily., Disp: , Rfl:  .  Glycerin-Hypromellose-PEG 400 0.2-0.36-1 % SOLN, Place 1 drop  into both eyes 3 (three) times daily., Disp: , Rfl:  .  HYDROcodone-acetaminophen (NORCO) 5-325 MG tablet, Take 1 tablet by mouth every 6 (six) hours as needed for moderate pain., Disp: 30 tablet, Rfl: 0 .  lactulose (CHRONULAC) 10 GM/15ML solution, Take 30 mLs by mouth 2 (two) times daily as needed for constipation., Disp: , Rfl: 0 .  levothyroxine (SYNTHROID, LEVOTHROID) 50 MCG tablet, Take 1 tablet (50 mcg total) by mouth daily before breakfast., Disp: 30 tablet, Rfl: 5 .  LORazepam (ATIVAN) 0.5 MG tablet, Take 0.5 mg by mouth 2 (two) times daily. , Disp: ,  Rfl: 4 .  Menthol, Topical Analgesic, (BIOFREEZE ROLL-ON) 4 % GEL, Apply 1 application topically 3 (three) times daily., Disp: , Rfl:  .  mercaptopurine (PURINETHOL) 50 MG tablet, Take 0.5 tablets (25 mg total) by mouth daily. Give on an empty stomach 1 hour before or 2 hours after meals. Caution: Chemotherapy., Disp: 30 tablet, Rfl: 11 .  mirtazapine (REMERON) 45 MG tablet, Take 45 mg by mouth at bedtime. , Disp: , Rfl: 10 .  ondansetron (ZOFRAN ODT) 8 MG disintegrating tablet, Take 1 tablet (8 mg total) by mouth every 8 (eight) hours as needed for nausea or vomiting., Disp: 10 tablet, Rfl: 0 .  Propylene Glycol-Glycerin (ARTIFICIAL TEARS) 1-0.3 % SOLN, Place 1 drop into both eyes 3 (three) times daily., Disp: , Rfl:  .  senna (SENOKOT) 8.6 MG TABS tablet, Take 1 tablet by mouth daily., Disp: , Rfl:  .  verapamil (VERELAN PM) 120 MG 24 hr capsule, Take 120 mg by mouth at bedtime., Disp: , Rfl:  Exam: Current vital signs: Ht 5\' 7"  (1.702 m)   Wt 50.7 kg   BMI 17.51 kg/m  Vital signs in last 24 hours: Weight:  [50.7 kg] 50.7 kg (08/14 1200) Blood pressure 157/70, heart rate 135 GENERAL: Awake, alert in NAD HEENT: - Normocephalic and atraumatic, dry mm, no LN++, no Thyromegally LUNGS - Clear to auscultation bilaterally with no wheezes CV -S1-S2 heard, tachycardic and irregular.. ABDOMEN - Soft, nontender, nondistended with normoactive BS Ext: warm, well perfused, intact peripheral pulses, no edema  NEURO:  Mental Status: Awake, alert, oriented to self.  Could not tell me where she is.  Poor attention concentration. Language: speech is severely dysarthric.  Initially was able to name simple objects like watch but was not able to follow commands outside of midline.  Was able to show me her tongue but was not able to show me 2 fingers or close her fist. Cranial Nerves: PERRL EOMI, visual fields full, right lower facial weakness observed,  sternocleidomastoid and trapezius muscle strength. No  evidence of tongue atrophy or fibrillations Motor: Antigravity without drift in both upper extremities.  Both lower extremities had vertical drift and touch the bed before 5 seconds.  No lateralizing findings. Tone: is normal and bulk is normal Sensation- Intact to light touch bilaterally Coordination: Unable to perform Gait- deferred NIHSS 1a Level of Conscious.: 0 1b LOC Questions: 2 1c LOC Commands: 1 2 Best Gaze: 0 3 Visual: 0 4 Facial Palsy: 1 5a Motor Arm - left: 0 5b Motor Arm - Right: 0 6a Motor Leg - Left: 2 6b Motor Leg - Right: 2 7 Limb Ataxia: 0 8 Sensory: 0 9 Best Language: 2 10 Dysarthria: 2 11 Extinct. and Inatten.: 0 TOTAL: 12  On repeat examination a few minutes after the CT scan, she was much more verbal, was able to name multiple simple objects.  Was  able to follow all commands.  Labs I have reviewed labs in epic and the results pertinent to this consultation are:  CBC    Component Value Date/Time   WBC 9.2 09/16/2017 1233   RBC 3.99 09/16/2017 1233   HGB 13.9 09/16/2017 1240   HCT 41.0 09/16/2017 1240   PLT 264 09/16/2017 1233   MCV 99.2 09/16/2017 1233   MCV 103.6 (A) 05/28/2013 1526   MCH 33.3 09/16/2017 1233   MCHC 33.6 09/16/2017 1233   RDW 13.5 09/16/2017 1233   LYMPHSABS 1.2 09/16/2017 1233   MONOABS 1.0 09/16/2017 1233   EOSABS 0.4 09/16/2017 1233   BASOSABS 0.0 09/16/2017 1233    CMP     Component Value Date/Time   NA 134 (L) 09/16/2017 1240   K 2.7 (LL) 09/16/2017 1240   CL 94 (L) 09/16/2017 1240   CO2 25 07/29/2017 0453   GLUCOSE 98 09/16/2017 1240   BUN 21 09/16/2017 1240   CREATININE 1.00 09/16/2017 1240   CALCIUM 8.4 (L) 07/29/2017 0453   PROT 7.1 07/27/2017 1546   ALBUMIN 3.2 (L) 07/27/2017 1546   AST 17 07/27/2017 1546   ALT 12 (L) 07/27/2017 1546   ALKPHOS 122 07/27/2017 1546   BILITOT 0.3 07/27/2017 1546   GFRNONAA >60 07/29/2017 0453   GFRAA >60 07/29/2017 0453    Lipid Panel     Component Value Date/Time    CHOL 168 11/24/2011 0500   TRIG 33 11/24/2011 0500   HDL 86 11/24/2011 0500   CHOLHDL 2.0 11/24/2011 0500   VLDL 7 11/24/2011 0500   LDLCALC 75 11/24/2011 0500    Imaging I have reviewed the images obtained:  CT-scan of the brain-no acute changes.  Age-indeterminate left frontal cortex infarct as well as age-indeterminate right superior cerebellar infarct. Aspects was called 9 because of the age-indeterminate left frontal cortex infarct.  Small vessel ischemia. No emergent LVO on CT angiogram head and neck  Assessment:  [age] year old with past medical history of atrial fibrillation not on anticoagulation, hypertension, anxiety depression and hypothyroidism last seen on 11 AM brought for evaluation of facial droop and possible aphasia along with some right-sided weakness. Neurological examination in the emergency room initially showed right facial droop and possible aphasia and dysarthria but unclear of how different this is from her baseline. Her symptoms improved some on repeat exam. She was in atrial fibrillation with RVR on arrival and her heart rate has improved with treatment, and so have her neurological symptoms. TPA was not offered because of unclear baseline and some improvement in symptoms. Stroke is top of the differential.  TIA due to rapid heart rate/hypoperfusion could be other differential.  Other differentials include toxic metabolic encephalopathy.  Very low on the differential is seizure activity although none was reported or witnessed.  Impression: Evaluate for stroke/TIA Atrial fibrillation with RVR-not on anticoagulation. Evaluate for toxic metabolic encephalopathy Evaluate for seizure  Recommendations: Telemetry Stat MRI to look at the stroke size.  This will be also helpful in answering questions about appropriateness of starting anticoagulation for cardiac reasons-depending on stroke size. 2D echo Hemoglobin A1c Lipid panel Frequent neurochecks Keep in the  window for IV TPA till 4 and half hours from last known normal. Physical therapy Outpatient therapy Speech therapy N.p.o. until cleared for swallow by bedside evaluation or formal swallow evaluation Consider cardiology consultation. Check labs including CBC, BMP, urinalysis  Stroke team will follow with you.  -- Milon DikesAshish Kashara Blocher, MD [facility] Pager: [phone] If 7pm to 7am,  please call on call as listed on AMION.   ADDENDUM MRI reviewed.  Embolic-looking strokes in multiple vascular territories in the left cerebral hemisphere and cerebellar strokes. Likely cardioembolic, secondary to known atrial fibrillation. She is now rate controlled and do not think would require immediate anticoagulation. But if, from a cardiology standpoint, patient needs anticoagulation, in terms of heparin, it can be started with stroke protocol-no bolus and lower APTT goal-please consult pharmacy for that.  She will benefit from long term anticoagulation with DOAC, recs for which will be provided by stroke team.  Stroke team will continue to follow with you. -- Milon Dikes, MD [facility] Pager: [phone] If 7pm to 7am, please call on call as listed on AMION.

## 2017-09-16 NOTE — Progress Notes (Signed)
ANTICOAGULATION CONSULT NOTE - Initial Consult  Pharmacy Consult for heparin Indication: atrial fibrillation and stroke  Allergies  Allergen Reactions  . Amlodipine Besylate     REACTION: itching  . Azatadine     Per MAR?  [name] Kitchen. Azathioprine     REACTION: nausea  . Penicillins     REACTION: yeast infection Per MAR  . Sertraline Hcl     REACTION: rash  . Sulfonamide Derivatives     REACTION: swelling and itching    Patient Measurements: Height: 5\' 7"  (170.2 cm) Weight: 111 lb 12.4 oz (50.7 kg) IBW/kg (Calculated) : 61.6 Heparin Dosing Weight: 50.7kg  Vital Signs: BP: 152/82 (08/14 1506) Pulse Rate: 86 (08/14 1506)  Labs: Recent Labs    09/16/17 1233 09/16/17 1240  HGB 13.3 13.9  HCT 39.6 41.0  PLT 264  --   APTT 31  --   LABPROT 13.8  --   INR 1.07  --   CREATININE 1.08* 1.00    Estimated Creatinine Clearance: 29.3 mL/min (by C-G formula based on SCr of 1 mg/dL).   Medical History: Past Medical History:  Diagnosis Date  . ALLERGIC RHINITIS CAUSE UNSPECIFIED   . ANXIETY   . Autoimmune hepatitis (HCC)    on MP6, prev pred  . DEPRESSION, MAJOR, MODERATE   . Headache(784.0) 02/15/2010  . HYPERTENSION, BENIGN ESSENTIAL   . HYPOTHYROIDISM   . OSTEOPOROSIS    fx L prox hum and L distal radius, both requiring ORIF 02/2013  . Unspecified vitamin D deficiency     Medications:  Infusions:  . sodium chloride    . diltiazem (CARDIZEM) infusion    . heparin    . potassium chloride    . vancomycin 1,000 mg (09/16/17 1510)    Assessment: 89 yof presented to the ED as a Code Stroke. Found to be in afib and now starting IV heparin. Will not bolus and will do low dose per stroke protocol. Baseline CBC Is WNL and pt is not on anticoagulation PTA.   Goal of Therapy:  Heparin level 0.3-0.5 units/ml Monitor platelets by anticoagulation protocol: Yes   Plan:  Heparin gtt 600 units/hr Check an 8 hr heparin level Daily heparin level and CBC F/u plans for  DOAC  Derec Mozingo, Drake LeachRachel Lynn 09/16/2017,4:03 PM

## 2017-09-16 NOTE — Progress Notes (Signed)
New Admission Note:  Arrival Method: on stretcher from the ER,  Mental Orientation: oriented to self.  Telemetry: bedside monitor Assessment: Completed Skin: abrasion on R shoulder, bruising on R elbow, abrasions bilaterally on knee's. Moisture associated damage on buttocks (redness). IV: R AC, R forearm & L hand. Pain:0/10 Safety Measures: Safety Fall Prevention Plan was given, discussed. [id number]: Patient has been orientated to the room, unit and the staff. Family: daughter updated @ bedside.   Orders have been reviewed and implemented. Will continue to monitor the patient. Call light has been placed within reach and bed alarm has been activated.   Lawernce IonYari Wolfe Camarena ,RN

## 2017-09-16 NOTE — Code Documentation (Signed)
84[facility]. Patient from assisted living facility where she was reportedly LKW at 1100 per staff. Patient later seen by a NP and noted to have right facial droop and difficulty speaking. EMS called and activated a code stroke. Of note, patient in atrial fibrillation with RVR. History of atrial fibrillation not on anticoagulation per chart. Stroke team at the bedside on patient arrival. Labs drawn and patient to CT with team. CT completed followed by CTA. NIHSS 12, see documentation for details and code stroke times. Patient unable to answer questions appropriately, however follows commands on exam. Right facial droop and extremities weak bilaterally on exam. Patient able to name at times, but severely dysarthric. Dr. Wilford CornerArora at the bedside. No acute stroke treatment at this time. Patient transported back to the ED. Bedside handoff with ED RN Florentina AddisonKatie.

## 2017-09-17 ENCOUNTER — Inpatient Hospital Stay (HOSPITAL_COMMUNITY): Payer: Medicare Other

## 2017-09-17 DIAGNOSIS — I361 Nonrheumatic tricuspid (valve) insufficiency: Secondary | ICD-10-CM

## 2017-09-17 DIAGNOSIS — I63412 Cerebral infarction due to embolism of left middle cerebral artery: Principal | ICD-10-CM

## 2017-09-17 DIAGNOSIS — I482 Chronic atrial fibrillation: Secondary | ICD-10-CM

## 2017-09-17 LAB — BASIC METABOLIC PANEL
Anion gap: 11 (ref 5–15)
BUN: 16 mg/dL (ref 8–23)
CO2: 22 mmol/L (ref 22–32)
CREATININE: 0.91 mg/dL (ref 0.44–1.00)
Calcium: 8.3 mg/dL — ABNORMAL LOW (ref 8.9–10.3)
Chloride: 99 mmol/L (ref 98–111)
GFR calc Af Amer: >60 mL/min (ref >60)
GFR, EST NON AFRICAN AMERICAN: 54 mL/min — AB (ref >60)
Glucose, Bld: 91 mg/dL (ref 70–99)
POTASSIUM: 3.3 mmol/L — AB (ref 3.5–5.1)
SODIUM: 132 mmol/L — AB (ref 135–145)

## 2017-09-17 LAB — PROTIME-INR
INR: 1.15
PROTHROMBIN TIME: 14.6 s (ref 11.4–15.2)

## 2017-09-17 LAB — LIPID PANEL
Cholesterol: 155 mg/dL (ref 0–200)
HDL: 47 mg/dL (ref >40)
LDL CALC: 94 mg/dL (ref 0–99)
Total CHOL/HDL Ratio: 3.3 RATIO
Triglycerides: 71 mg/dL (ref <150)
VLDL: 14 mg/dL (ref 0–40)

## 2017-09-17 LAB — HEMOGLOBIN A1C
HEMOGLOBIN A1C: 6 % — AB (ref 4.8–5.6)
MEAN PLASMA GLUCOSE: 125.5 mg/dL

## 2017-09-17 LAB — ECHOCARDIOGRAM COMPLETE
Height: 67 in
Weight: 1788.37 oz

## 2017-09-17 LAB — HEPARIN LEVEL (UNFRACTIONATED)
Heparin Unfractionated: <0.1 IU/mL — ABNORMAL LOW (ref 0.30–0.70)
Heparin Unfractionated: 0.23 IU/mL — ABNORMAL LOW (ref 0.30–0.70)
Heparin Unfractionated: 0.34 IU/mL (ref 0.30–0.70)

## 2017-09-17 MED ORDER — KETOROLAC TROMETHAMINE 15 MG/ML IJ SOLN
15.0000 mg | Freq: Four times a day (QID) | INTRAMUSCULAR | Status: DC
  Administered 2017-09-17 – 2017-09-21 (×16): 15 mg via INTRAVENOUS
  Filled 2017-09-17 (×17): qty 1

## 2017-09-17 MED ORDER — POTASSIUM CHLORIDE 10 MEQ/100ML IV SOLN
10.0000 meq | INTRAVENOUS | Status: AC
  Administered 2017-09-17 (×3): 10 meq via INTRAVENOUS
  Filled 2017-09-17 (×4): qty 100

## 2017-09-17 NOTE — Evaluation (Signed)
Physical Therapy Evaluation Patient Details Name: Marisa Smith MRN: [id number] DOB: [birth year] Today's Date: 09/17/2017   History of Present Illness  Patient is a 84 y/o female presenting to the ED on 09/16/17 with stroke like symptoms: facial droop and aphasia.  CT head: Small age-indeterminate infarct in the left frontal cortex, Small age-indeterminate right superior cerebellar infarct. MRI brain: Multiple foci of acute ischemia within the left hemisphere with the largest lesions located in the left frontal lobe, Punctate subacute right cerebellar infarct, Multiple old cerebellar infarcts and advanced chronic ischemic microangiopathy. PMH significant for hypothyroidism; HTN; afib not on AC; and autoimmune hepatitis.    Clinical Impression  Patient admitted with the above listed diagnosis. Patient was a resident of SNF prior to admission - unsure of true PLOF as patient is a poor historian and demonstrates limited verbalizations. Patient today requiring general total A +2 for all bed mobility and supine to/from sit with limited tolerance. PT to recommend rehab at SNF to progress functional mobility as able. PT to follow acutely.    Follow Up Recommendations SNF;Supervision/Assistance - 24 hour    Equipment Recommendations  (TBD)    Recommendations for Other Services OT consult     Precautions / Restrictions Precautions Precautions: Fall;Other (comment)(skin breakdown) Restrictions Weight Bearing Restrictions: No      Mobility  Bed Mobility Overal bed mobility: Needs Assistance Bed Mobility: Supine to Sit;Sit to Supine;Rolling Rolling: Total assist   Supine to sit: Total assist;+2 for physical assistance Sit to supine: Total assist;+2 for physical assistance   General bed mobility comments: does not tolerate sitting EOB for unknown reason  Transfers                 General transfer comment: deferred for patient safety  Ambulation/Gait                Stairs             Wheelchair Mobility    Modified Rankin (Stroke Patients Only) Modified Rankin (Stroke Patients Only) Pre-Morbid Rankin Score: Moderately severe disability Modified Rankin: Severe disability     Balance Overall balance assessment: Needs assistance Sitting-balance support: Feet unsupported;Bilateral upper extremity supported Sitting balance-Leahy Scale: Poor                                       Pertinent Vitals/Pain Pain Assessment: Faces Faces Pain Scale: Hurts even more Pain Location: generalized Pain Descriptors / Indicators: Discomfort;Grimacing;Moaning Pain Intervention(s): Limited activity within patient's tolerance;Repositioned    Home Living Family/patient expects to be discharged to:: Skilled nursing facility                      Prior Function Level of Independence: Needs assistance   Gait / Transfers Assistance Needed: walker for ambulation   ADL's / Homemaking Assistance Needed: assist for bathing, dressing  Comments: assist for adls; walked to dining room and toileting mod  - information from chart 6/19     Hand Dominance        Extremity/Trunk Assessment   Upper Extremity Assessment Upper Extremity Assessment: Defer to OT evaluation RUE Deficits / Details: RUE appeasr weaker than L    Lower Extremity Assessment Lower Extremity Assessment: Generalized weakness(will move LE in bed minimally - not against gravity)    Cervical / Trunk Assessment Cervical / Trunk Assessment: Other exceptions Cervical / Trunk Exceptions: unable to obtain and  maintain midline without physical assist  Communication   Communication: HOH  Cognition Arousal/Alertness: Lethargic Behavior During Therapy: Flat affect;Restless Overall Cognitive Status: No family/caregiver present to determine baseline cognitive functioning                                        General Comments      Exercises     Assessment/Plan     PT Assessment Patient needs continued PT services  PT Problem List Decreased strength;Decreased activity tolerance;Decreased mobility;Decreased balance;Decreased coordination;Decreased knowledge of use of DME;Decreased safety awareness;Decreased knowledge of precautions       PT Treatment Interventions DME instruction;Gait training;Functional mobility training;Therapeutic activities;Therapeutic exercise;Balance training;Neuromuscular re-education;Patient/family education    PT Goals (Current goals can be found in the Care Plan section)  Acute Rehab PT Goals Patient Stated Goal: unable to state PT Goal Formulation: With patient Time For Goal Achievement: 10/01/17 Potential to Achieve Goals: Fair    Frequency Min 2X/week   Barriers to discharge        Co-evaluation PT/OT/SLP Co-Evaluation/Treatment: Yes Reason for Co-Treatment: Complexity of the patient's impairments (multi-system involvement);Necessary to address cognition/behavior during functional activity;For patient/therapist safety;To address functional/ADL transfers PT goals addressed during session: Mobility/safety with mobility OT goals addressed during session: ADL's and self-care       AM-PAC PT "6 Clicks" Daily Activity  Outcome Measure Difficulty turning over in bed (including adjusting bedclothes, sheets and blankets)?: Unable Difficulty moving from lying on back to sitting on the side of the bed? : Unable Difficulty sitting down on and standing up from a chair with arms (e.g., wheelchair, bedside commode, etc,.)?: Unable Help needed moving to and from a bed to chair (including a wheelchair)?: Total Help needed walking in hospital room?: Total Help needed climbing 3-5 steps with a railing? : Total 6 Click Score: 6    End of Session   Activity Tolerance: Patient tolerated treatment well Patient left: in bed;with call bell/phone within reach;with bed alarm set Nurse Communication: Mobility status PT Visit  Diagnosis: Unsteadiness on feet (R26.81);Other abnormalities of gait and mobility (R26.89);Muscle weakness (generalized) (M62.81)    Time: [id number] PT Time Calculation (min) (ACUTE ONLY): 17 min   Charges:   PT Evaluation $PT Eval Moderate Complexity: 1 Mod          Kipp LaurenceStephanie R Aaron, PT, DPT 09/17/17 1:21 PM Pager: [phone]

## 2017-09-17 NOTE — Progress Notes (Addendum)
Occupational Therapy Evaluation Patient Details Name: Raymon Muttonnnie S Constable MRN: [id number] DOB: 03-21-1924 Today's Date: 09/17/2017    History of Present Illness Patient is a 84 y/o female presenting to the ED on 09/16/17 with stroke like symptoms: facial droop and aphasia.  CT head: Small age-indeterminate infarct in the left frontal cortex, Small age-indeterminate right superior cerebellar infarct. MRI brain: Multiple foci of acute ischemia within the left hemisphere with the largest lesions located in the left frontal lobe, Punctate subacute right cerebellar infarct, Multiple old cerebellar infarcts and advanced chronic ischemic microangiopathy. PMH significant for hypothyroidism; HTN; afib not on AC; and autoimmune hepatitis.   Clinical Impression   Pt admitted from SNF. Apparently in 07/2017, pt was living in the ALF at [facility] and was ambulating with a walker and had assistance as needed with her self care. Pt currently requires total A +2 with bed mobility and ADL tasks. Pt unable to tolerate sitting EOB as saying "no no", and pushing back onto bed. Unsure if pt experiencing dizziness with mobility given her cerebellar infarct. Pt able to say her name and ask for "water". Not following commands. Recommend rehab at SNF. Will follow acutely to facilitate DC to SNF.   Recommend B Prevalon boots. Nsg asked to order.     Follow Up Recommendations  SNF;Supervision/Assistance - 24 hour    Equipment Recommendations  None recommended by OT    Recommendations for Other Services       Precautions / Restrictions Precautions Precautions: Fall;Other (comment)(skin breakdown)      Mobility Bed Mobility Overal bed mobility: Needs Assistance Bed Mobility: Supine to Sit;Sit to Supine     Supine to sit: +2 for physical assistance;Total assist Sit to supine: Total assist;+2 for physical assistance      Transfers                 General transfer comment: unable to stand at this time; pt  pushing back onto bed saying "no no" - unsure if this was associated with possible dizziness or fear of falling    Balance Overall balance assessment: Needs assistance   Sitting balance-Leahy Scale: Poor                                     ADL either performed or assessed with clinical judgement   ADL Overall ADL's : Needs assistance/impaired Eating/Feeding: NPO                                   Functional mobility during ADLs: Total assistance;+2 for physical assistance General ADL Comments: total A     Vision   Vision Assessment?: Vision impaired- to be further tested in functional context     Perception     Praxis Praxis Praxis tested?: Deficits Deficits: Initiation    Pertinent Vitals/Pain Pain Assessment: Faces Faces Pain Scale: Hurts even more Pain Location: general discomfort Pain Descriptors / Indicators: Discomfort;Grimacing;Moaning Pain Intervention(s): Limited activity within patient's tolerance     Hand Dominance (uncertain but suspect right handed)   Extremity/Trunk Assessment Upper Extremity Assessment Upper Extremity Assessment: RUE deficits/detail RUE Deficits / Details: RUE appeasr weaker than L   Lower Extremity Assessment Lower Extremity Assessment: Defer to PT evaluation   Cervical / Trunk Assessment Cervical / Trunk Assessment: Other exceptions(unable to maintain midlien postrual control)   Communication Communication Communication:  HOH   Cognition Arousal/Alertness: Lethargic Behavior During Therapy: Flat affect;Restless Overall Cognitive Status: No family/caregiver present to determine baseline cognitive functioning                                     General Comments       Exercises     Shoulder Instructions      Home Living Family/patient expects to be discharged to:: Skilled nursing facility                                        Prior Functioning/Environment  Level of Independence: Needs assistance  Gait / Transfers Assistance Needed: walker for ambulation  ADL's / Homemaking Assistance Needed: assist for bathing, dressing   Comments: assist for adls; walked to dining room and toileting mod  - information from chart 6/19        OT Problem List: Decreased strength;Decreased activity tolerance;Impaired balance (sitting and/or standing);Impaired vision/perception;Decreased coordination;Decreased cognition;Decreased safety awareness;Decreased knowledge of use of DME or AE;Cardiopulmonary status limiting activity;Impaired UE functional use;Pain      OT Treatment/Interventions: Self-care/ADL training;Neuromuscular education;DME and/or AE instruction;Therapeutic activities;Cognitive remediation/compensation;Visual/perceptual remediation/compensation;Patient/family education;Balance training    OT Goals(Current goals can be found in the care plan section) Acute Rehab OT Goals Patient Stated Goal: unable to state OT Goal Formulation: Patient unable to participate in goal setting Time For Goal Achievement: 10/01/17 Potential to Achieve Goals: Fair  OT Frequency: Min 2X/week   Barriers to D/C:            Co-evaluation PT/OT/SLP Co-Evaluation/Treatment: Yes Reason for Co-Treatment: Complexity of the patient's impairments (multi-system involvement);For patient/therapist safety   OT goals addressed during session: ADL's and self-care      AM-PAC PT "6 Clicks" Daily Activity     Outcome Measure Help from another person eating meals?: Total Help from another person taking care of personal grooming?: Total Help from another person toileting, which includes using toliet, bedpan, or urinal?: Total Help from another person bathing (including washing, rinsing, drying)?: Total Help from another person to put on and taking off regular upper body clothing?: Total Help from another person to put on and taking off regular lower body clothing?: Total 6  Click Score: 6   End of Session Nurse Communication: Mobility status;Other (comment)(need for B Prevalon boots)  Activity Tolerance: Patient limited by fatigue Patient left: in bed;with call bell/phone within reach;with bed alarm set  OT Visit Diagnosis: Other abnormalities of gait and mobility (R26.89);Muscle weakness (generalized) (M62.81);Other symptoms and signs involving cognitive function;Pain                Time: [id number] OT Time Calculation (min): 20 min Charges:  OT General Charges $OT Visit: 1 Visit OT Evaluation $OT Eval Moderate Complexity: 1 Mod  Rim Thatch, OT/L  OT Clinical Specialist [phone]   [facility] Hospital[facility],HILLARY 09/17/2017, 12:50 PM

## 2017-09-17 NOTE — Evaluation (Signed)
Speech Language Pathology Evaluation Patient Details Name: Marisa Smith MRN: [id number] DOB: 15-Jul-1924 Today's Date: 09/17/2017 Time: 0822-0832 SLP Time Calculation (min) (ACUTE ONLY): 10 min  Problem List:  Patient Active Problem List   Diagnosis Date Noted  . CVA (cerebral vascular accident) (HCC) 09/16/2017  . AKI (acute kidney injury) (HCC) 09/16/2017  . Closed compression fracture of L1 lumbar vertebra, initial encounter (HCC) 07/27/2017  . Hydronephrosis of left kidney 07/27/2017  . A-fib (HCC) 07/27/2017  . Mass of left kidney 07/27/2017  . Wrist fracture 02/29/2016  . Distal radius fracture, right 02/29/2016  . Proximal left humerus fracture 02/22/2013  . Radius fracture 02/16/2013  . Bradycardia 12/06/2011  . Hyponatremia 11/24/2011  . Hyperglycemia 11/24/2011  . Autoimmune hepatitis (HCC)   . Headache(784.0) 02/15/2010  . TREMOR 02/02/2010  . ANXIETY 12/11/2009  . DEPRESSION, MAJOR, MODERATE 10/19/2009  . Hypothyroidism 04/26/2009  . Unspecified vitamin D deficiency 04/26/2009  . HYPERTENSION, BENIGN ESSENTIAL 04/26/2009  . OSTEOPOROSIS 04/26/2009   Past Medical History:  Past Medical History:  Diagnosis Date  . ALLERGIC RHINITIS CAUSE UNSPECIFIED   . ANXIETY   . Autoimmune hepatitis (HCC)    on MP6, prev pred  . DEPRESSION, MAJOR, MODERATE   . Headache(784.0) 02/15/2010  . HYPERTENSION, BENIGN ESSENTIAL   . HYPOTHYROIDISM   . OSTEOPOROSIS    fx L prox hum and L distal radius, both requiring ORIF 02/2013  . Unspecified vitamin D deficiency    Past Surgical History:  Past Surgical History:  Procedure Laterality Date  . CATARACT EXTRACTION  2008   Left eye  . CATARACT EXTRACTION  2009   Right eye  . HUMERUS IM NAIL Left 02/2013   DR Sol BlazingTEMAN  . HUMERUS IM NAIL Left 02/24/2013   Procedure: INTRAMEDULLARY (IM) NAIL LEFT PROXIMAL  HUMERUS;  Surgeon: Mable ParisJustin William Chandler, MD;  Location: [facility] OR;  Service: Orthopedics;  Laterality: Left;  . IR KYPHO LUMBAR  INC FX REDUCE BONE BX UNI/BIL CANNULATION INC/IMAGING  07/28/2017  . OPEN REDUCTION INTERNAL FIXATION (ORIF) DISTAL RADIAL FRACTURE Left 02/16/2013   Procedure: OPEN REDUCTION INTERNAL FIXATION (ORIF) DISTAL RADIAL FRACTURE;  Surgeon: Sharma CovertFred W Ortmann, MD;  Location: [facility] OR;  Service: Orthopedics;  Laterality: Left;  . ORIF RADIAL FRACTURE Right 03/01/2016   Procedure: OPEN REDUCTION INTERNAL FIXATION (ORIF) RADIUS FRACTURE;  Surgeon: Dominica SeverinWilliam Gramig, MD;  Location: [facility] OR;  Service: Orthopedics;  Laterality: Right;  . TONSILLECTOMY  1945   HPI:  84 yo female who resides in memory unit per report of RN admitted with right facial droop and aphasia.  Pt found to have acute cva - left frontal ischemia and old cerebellar infarcts.     Assessment / Plan / Recommendation Clinical Impression  Pt with minimal participation during speech/language evaluation, sighing and turning away from SLP.  This is likely impacting validity of testing.  She did not follow single step directions nor attempt automatic speech tasks with maximal slp cues. She did attempt to articulate x2 during session but was grossly dysarthric and unintelligible.  Suspect components of aphasia, motor planning and dysarthria.  Recommend follow up SLP to help maximize speech/language and cognitive abilities.      SLP Assessment  SLP Recommendation/Assessment: Patient needs continued Speech Lanaguage Pathology Services SLP Visit Diagnosis: Dysarthria and anarthria (R47.1);Aphasia (R47.01)    Follow Up Recommendations  Skilled Nursing facility    Frequency and Duration min 1 x/week  1 week      SLP Evaluation Cognition  Orientation Level: Oriented  to person;Disoriented to place;Disoriented to time;Disoriented to situation Attention: Focused Focused Attention: Impaired Memory: (could not assess due to pt's memory deficits) Problem Solving: Impaired Problem Solving Impairment: Functional basic Executive Function: (pt is not at this level  of functioning) Safety/Judgment: Impaired       Comprehension  Auditory Comprehension Overall Auditory Comprehension: Impaired Yes/No Questions: Impaired Basic Biographical Questions: 0-25% accurate Commands: Impaired One Step Basic Commands: 0-24% accurate Conversation: Simple Interfering Components: Attention;Motor planning;Processing speed Visual Recognition/Discrimination Discrimination: (pt did not follow directions to point to toothbrush) Reading Comprehension Reading Status: Not tested    Expression Expression Primary Mode of Expression: Verbal Verbal Expression Overall Verbal Expression: Impaired Automatic Speech: (pt did not attempt automatic speech tasks with total cues) Repetition: Impaired(pt did not attempt repetition of sounds, appears frustrated during session) Pragmatics: Unable to assess Written Expression Dominant Hand: (uncertain but suspect right handed)   Oral / Motor  Oral Motor/Sensory Function Overall Oral Motor/Sensory Function: Severe impairment(pt did not follow directions, did not seal lips on spoon or protrude tongue with directions/visual cues) Facial ROM: Reduced right Facial Symmetry: Abnormal symmetry right Facial Sensation: Other (Comment) Lingual ROM: Suspected CN XII (hypoglossal) dysfunction Lingual Symmetry: Suspected CN XII (hypoglossal) dysfunction Lingual Strength: Suspected CN XII (hypoglossal) dysfunction Mandible: (dnt) Motor Speech Overall Motor Speech: Impaired Respiration: Impaired Phonation: Low vocal intensity Articulation: Impaired Intelligibility: Intelligibility reduced Word: 0-24% accurate Phrase: 0-24% accurate Sentence: 0-24% accurate(she attempts to verbalize needs but speech is grossly dysarthric, unintelligible, suspect dysarthria and aphasia) Motor Planning: (pt did not follow directions, did not initiate speech/articulation with max cues)   GO                    Chales AbrahamsKimball, Thomasina Housley Ann 09/17/2017, 9:02  AM   Donavan Burnetamara Kade Demicco, MS [facility] SLP [phone]

## 2017-09-17 NOTE — Evaluation (Signed)
Clinical/Bedside Swallow Evaluation Patient Details  Name: Marisa Smith MRN: [id number] Date of Birth: Jul 01, 1924  Today's Date: 09/17/2017 Time: SLP Start Time (ACUTE ONLY): [id number] SLP Stop Time (ACUTE ONLY): 0848 SLP Time Calculation (min) (ACUTE ONLY): 15 min  Past Medical History:  Past Medical History:  Diagnosis Date  . ALLERGIC RHINITIS CAUSE UNSPECIFIED   . ANXIETY   . Autoimmune hepatitis (HCC)    on MP6, prev pred  . DEPRESSION, MAJOR, MODERATE   . Headache(784.0) 02/15/2010  . HYPERTENSION, BENIGN ESSENTIAL   . HYPOTHYROIDISM   . OSTEOPOROSIS    fx L prox hum and L distal radius, both requiring ORIF 02/2013  . Unspecified vitamin D deficiency    Past Surgical History:  Past Surgical History:  Procedure Laterality Date  . CATARACT EXTRACTION  2008   Left eye  . CATARACT EXTRACTION  2009   Right eye  . HUMERUS IM NAIL Left 02/2013   DR Sol BlazingTEMAN  . HUMERUS IM NAIL Left 02/24/2013   Procedure: INTRAMEDULLARY (IM) NAIL LEFT PROXIMAL  HUMERUS;  Surgeon: Mable ParisJustin William Chandler, MD;  Location: [facility] OR;  Service: Orthopedics;  Laterality: Left;  . IR KYPHO LUMBAR INC FX REDUCE BONE BX UNI/BIL CANNULATION INC/IMAGING  07/28/2017  . OPEN REDUCTION INTERNAL FIXATION (ORIF) DISTAL RADIAL FRACTURE Left 02/16/2013   Procedure: OPEN REDUCTION INTERNAL FIXATION (ORIF) DISTAL RADIAL FRACTURE;  Surgeon: Sharma CovertFred W Ortmann, MD;  Location: [facility] OR;  Service: Orthopedics;  Laterality: Left;  . ORIF RADIAL FRACTURE Right 03/01/2016   Procedure: OPEN REDUCTION INTERNAL FIXATION (ORIF) RADIUS FRACTURE;  Surgeon: Dominica SeverinWilliam Gramig, MD;  Location: [facility] OR;  Service: Orthopedics;  Laterality: Right;  . TONSILLECTOMY  1945   HPI:  84 yo female who resides in memory unit per report of RN admitted with right facial droop and aphasia.  Pt found to have acute cva - left frontal ischemia and old cerebellar infarcts.     Assessment / Plan / Recommendation Clinical Impression  Pt currently presented with severe oral  dysphagia with suspected pharyngeal component. Obvious deficits impacting hypoglossal, facial and suspected glossopharyngeal nerves present.  She is very xerostomic and resistant to oral care but would hold her own toothbrush and try to brush dentition.  She did not orally manipulate single small ice chip or oral moisture via toothette.  Oral hold excessive and pt allowed bolus to prematurely spill from right labial region.   Delayed cough noted after excessively delayed swallow -concerning for aspiration of ice chips/water via toothtette.  Pt's gross lingual weakness prevents oral transiting of any boluses.    Of note, pt's HR and RR increases with agitation.  Recommend NPO, agressive oral care.  Advised RN to consider setting up oral suction given pt's resistance.  Will follow up for po readiness/appropriateness for instrumental evaluation.   SLP Visit Diagnosis: Dysphagia, oral phase (R13.11)(suspect pharyngeal phase dysphagia also)    Aspiration Risk  Severe aspiration risk;Risk for inadequate nutrition/hydration    Diet Recommendation NPO(oral care QD)   Medication Administration: Via alternative means    Other  Recommendations Oral Care Recommendations: Oral care QID   Follow up Recommendations Skilled Nursing facility      Frequency and Duration min 1 x/week  1 week       Prognosis Prognosis for Safe Diet Advancement: Guarded Barriers to Reach Goals: Severity of deficits;Cognitive deficits      Swallow Study   General Date of Onset: 09/17/17 HPI: 84 yo female who resides in memory unit per report of  RN admitted with right facial droop and aphasia.  Pt found to have acute cva - left frontal ischemia and old cerebellar infarcts.   Type of Study: Bedside Swallow Evaluation Previous Swallow Assessment: none in epic Diet Prior to this Study: NPO Temperature Spikes Noted: No Respiratory Status: Nasal cannula History of Recent Intubation: No Behavior/Cognition: Alert Oral Cavity  Assessment: Dried secretions Oral Care Completed by SLP: Yes(pt resistant to oral care, when holding toothbrush she attempted to brush her teeth) Oral Cavity - Dentition: Adequate natural dentition Self-Feeding Abilities: Total assist Patient Positioning: Upright in bed Baseline Vocal Quality: Low vocal intensity Volitional Cough: Cognitively unable to elicit Volitional Swallow: Unable to elicit    Oral/Motor/Sensory Function Overall Oral Motor/Sensory Function: Severe impairment(pt did not follow directions, did not seal lips on spoon or protrude tongue with directions/visual cues) Facial ROM: Reduced right Facial Symmetry: Abnormal symmetry right Facial Sensation: Other (Comment) Lingual ROM: Suspected CN XII (hypoglossal) dysfunction Lingual Symmetry: Suspected CN XII (hypoglossal) dysfunction Lingual Strength: Suspected CN XII (hypoglossal) dysfunction Mandible: (dnt)   Ice Chips Ice chips: Impaired Presentation: Spoon Oral Phase Impairments: Reduced labial seal;Reduced lingual movement/coordination;Poor awareness of bolus Oral Phase Functional Implications: Oral holding;Right anterior spillage;Prolonged oral transit Other Comments: pt did not initiate swallow, orally held bolus with anterior spill and delayed cough, presume cough from spillage of melted water into larynx   Thin Liquid Thin Liquid: Impaired Oral Phase Impairments: Reduced lingual movement/coordination;Poor awareness of bolus;Reduced labial seal Oral Phase Functional Implications: Prolonged oral transit;Oral holding;Oral residue;Right anterior spillage Pharyngeal  Phase Impairments: Cough - Delayed Other Comments: oral moisture via toothette    Nectar Thick Nectar Thick Liquid: Not tested   Honey Thick Honey Thick Liquid: Not tested   Puree Puree: Not tested   Solid     Solid: Not tested      Marisa Smith, Marisa Smith 09/17/2017,9:21 AM  Donavan Burnetamara [name], MS [facility] SLP [phone]

## 2017-09-17 NOTE — Progress Notes (Signed)
ANTICOAGULATION CONSULT NOTE - Initial Consult  Pharmacy Consult for Heparin Indication: atrial fibrillation and stroke  Allergies  Allergen Reactions  . Amlodipine Besylate     REACTION: itching  . Azatadine     Per MAR?  [name] Kitchen. Azathioprine     REACTION: nausea  . Penicillins     REACTION: yeast infection Per MAR  . Sertraline Hcl     REACTION: rash  . Sulfonamide Derivatives     REACTION: swelling and itching    Patient Measurements: Height: 5\' 7"  (170.2 cm) Weight: 111 lb 12.4 oz (50.7 kg) IBW/kg (Calculated) : 61.6 Heparin Dosing Weight: 50.7kg  Vital Signs: Temp: 98 F (36.7 C) (08/14 1850) Temp Source: Oral (08/14 1850) BP: 145/93 (08/15 0050) Pulse Rate: 93 (08/15 0050)  Labs: Recent Labs    09/16/17 1233 09/16/17 1240 09/17/17 0218  HGB 13.3 13.9  --   HCT 39.6 41.0  --   PLT 264  --   --   APTT 31  --   --   LABPROT 13.8  --  14.6  INR 1.07  --  1.15  HEPARINUNFRC  --   --  <0.10*  CREATININE 1.08* 1.00  --     Estimated Creatinine Clearance: 29.3 mL/min (by C-G formula based on SCr of 1 mg/dL).   Medical History: Past Medical History:  Diagnosis Date  . ALLERGIC RHINITIS CAUSE UNSPECIFIED   . ANXIETY   . Autoimmune hepatitis (HCC)    on MP6, prev pred  . DEPRESSION, MAJOR, MODERATE   . Headache(784.0) 02/15/2010  . HYPERTENSION, BENIGN ESSENTIAL   . HYPOTHYROIDISM   . OSTEOPOROSIS    fx L prox hum and L distal radius, both requiring ORIF 02/2013  . Unspecified vitamin D deficiency     Medications:  Infusions:  . sodium chloride 50 mL/hr at 09/16/17 2000  . diltiazem (CARDIZEM) infusion 7.5 mg/hr (09/16/17 2000)  . heparin 600 Units/hr (09/16/17 2000)    Assessment: [age] yof presented to the ED as a Code Stroke. Found to be in afib and now starting IV heparin. Will not bolus and will do low dose per stroke protocol. Baseline CBC Is WNL and pt is not on anticoagulation PTA.   8/15 am update: heparin level undetectable  Goal of Therapy:   Heparin level 0.3-0.5 units/ml Monitor platelets by anticoagulation protocol: Yes   Plan:  Inc heparin drip to 750 units/hr 1100 HL  Jammal Sarr 09/17/2017,2:56 AM

## 2017-09-17 NOTE — Progress Notes (Signed)
ANTICOAGULATION CONSULT NOTE - Follow Up Consult  Pharmacy Consult for Heparin Indication: atrial fibrillation, CVA   Allergies  Allergen Reactions  . Amlodipine Besylate     REACTION: itching  . Azatadine     Per MAR?  [name] Kitchen. Azathioprine     REACTION: nausea  . Penicillins     REACTION: yeast infection Per MAR  . Sertraline Hcl     REACTION: rash  . Sulfonamide Derivatives     REACTION: swelling and itching    Patient Measurements: Height: 5\' 7"  (170.2 cm) Weight: 111 lb 12.4 oz (50.7 kg) IBW/kg (Calculated) : 61.6 Heparin Dosing Weight: 50.7 kg  Vital Signs: Temp: 97.6 F (36.4 C) (08/15 1930) Temp Source: Oral (08/15 1930) BP: 145/92 (08/15 1930) Pulse Rate: 46 (08/15 1930)  Labs: Recent Labs    09/16/17 1233 09/16/17 1240 09/17/17 0218 09/17/17 1104 09/17/17 1956  HGB 13.3 13.9  --   --   --   HCT 39.6 41.0  --   --   --   PLT 264  --   --   --   --   APTT 31  --   --   --   --   LABPROT 13.8  --  14.6  --   --   INR 1.07  --  1.15  --   --   HEPARINUNFRC  --   --  <0.10* 0.23* 0.34  CREATININE 1.08* 1.00 0.91  --   --     Estimated Creatinine Clearance: 32.2 mL/min (by C-G formula based on SCr of 0.91 mg/dL).  Assessment:  Anticoag: Heparin for afib (not on anticoag PTA) , new CVA- CBC WNL, INR 1.07, no AC PTA, HL 0.23>0.34 now in goal range.  Goal of Therapy:  Heparin level 0.3-0.5 units/ml Monitor platelets by anticoagulation protocol: Yes   Plan:  Heparin gtt 850 units/hr Daily HL and CBC   Marisa Smith, PharmD, BCPS Clinical Staff Pharmacist Pager [phone]  Marisa Smith, Marisa Smith 09/17/2017,9:28 PM

## 2017-09-17 NOTE — Progress Notes (Signed)
  Echocardiogram 2D Echocardiogram has been performed.  Marisa Smith G Marisa Smith 09/17/2017, 3:19 PM

## 2017-09-17 NOTE — Progress Notes (Signed)
Potassium of 3.3 reported to [facility].

## 2017-09-17 NOTE — Progress Notes (Addendum)
STROKE TEAM PROGRESS NOTE   INTERVAL HISTORY No family is at the bedside.  She is lying in bed. On cardizem and IV heparin. Non-verbal, moans. MAEx4. Stroke workup underway.  Vitals:   09/17/17 0050 09/17/17 0250 09/17/17 0450 09/17/17 0650  BP: (!) 145/93 123/71 (!) 141/88 118/79  Pulse: 93 61 (!) 104 80  Resp: (!) 27 (!) 25 (!) 27 (!) 25  Temp:      TempSrc:      SpO2: 95% 94% 97% 94%  Weight:      Height:        CBC:  Recent Labs  Lab 09/16/17 1233 09/16/17 1240  WBC 9.2  --   NEUTROABS 6.6  --   HGB 13.3 13.9  HCT 39.6 41.0  MCV 99.2  --   PLT 264  --     Basic Metabolic Panel:  Recent Labs  Lab 09/16/17 1233 09/16/17 1240 09/16/17 1500 09/17/17 0218  NA 134* 134*  --  132*  K 2.7* 2.7*  --  3.3*  CL 94* 94*  --  99  CO2 26  --   --  22  GLUCOSE 102* 98  --  91  BUN 19 21  --  16  CREATININE 1.08* 1.00  --  0.91  CALCIUM 9.1  --   --  8.3*  MG  --   --  2.0  --   PHOS  --   --  2.8  --    Lipid Panel:     Component Value Date/Time   CHOL 155 09/17/2017 0218   TRIG 71 09/17/2017 0218   HDL 47 09/17/2017 0218   CHOLHDL 3.3 09/17/2017 0218   VLDL 14 09/17/2017 0218   LDLCALC 94 09/17/2017 0218   HgbA1c:  Lab Results  Component Value Date   HGBA1C 6.2 01/04/2013   Urine Drug Screen:     Component Value Date/Time   LABOPIA NONE DETECTED 11/24/2011 0553   COCAINSCRNUR NONE DETECTED 11/24/2011 0553   LABBENZ NONE DETECTED 11/24/2011 0553   AMPHETMU NONE DETECTED 11/24/2011 0553   THCU NONE DETECTED 11/24/2011 0553   LABBARB NONE DETECTED 11/24/2011 0553    Alcohol Level     Component Value Date/Time   ETH <10 09/16/2017 1233    IMAGING Ct Head Code Stroke Wo Contrast 09/16/2017 1. Small age-indeterminate infarct in the left frontal cortex. ASPECTS is 9. 2. Small age-indeterminate right superior cerebellar infarct. 3. Chronic small vessel ischemia.   Ct Angio Head W Or Wo Contrast Ct Angio Neck W Or Wo Contrast 09/16/2017 1. No large  vessel occlusion. 2. Overall mild for age atherosclerosis without flow limiting stenosis in the major vessels. 3. Focal atherosclerotic irregularity of a left M3 branch of note given the preceding head CT findings. 4. There are layering pleural effusions. Small focus of airspace disease in the right upper lobe that is indistinct and likely inflammatory.   Mr Brain Wo Contrast 09/16/2017 1. Multiple foci of acute ischemia within the left hemisphere with the largest lesions located in the left frontal lobe. No hemorrhage or mass effect. 2. Punctate subacute right cerebellar infarct suspected on the basis of DWI hyperintensity without corresponding decrease of apparent diffusion coefficient. 3. Multiple old cerebellar infarcts and advanced chronic ischemic microangiopathy.   PHYSICAL EXAM GENERAL: Awake, alert in NAD HEENT: - Normocephalic and atraumatic LUNGS - Clear to auscultation bilaterally  CV -S1-S2 heard, irregular irregular Ext: warm, well perfused, intact peripheral pulses, no edema  NEURO:  Mental  Status: Awake, alert, Poor attention concentration. Language: nonverbal this am. Moans. Will nod head Y and N, but inappropriate. Would not follow any verbal commands. Would not mimic Cranial Nerves: PERRL EOMI, blinks to threat bilaterally, right lower facial weakness  Motor: Antigravity without drift in both upper extremities. Equal purposeful movement BLE without obvious weakness. No lateralizing findings. Tone: is normal and bulk is normal Sensation- Intact to light touch and cold bilaterally Coordination: Unable to perform Gait- deferred   ASSESSMENT/PLAN Ms. Raymon Muttonnnie S Dust is a 84 y.o. female with history of AF not on AC, HTN, hypothyroidism, anxiety, depression and LBP d/t recent L1 compression fx s/p kyphoplasty presenting with right facial droop, right-sided weakness, aphasia.   Stroke:  Scattered L MCA and puctate R cerebellar infarcts embolic secondary to known atrial  fibrillation source not on anticoagulation  Code Stroke CT head age indeterminate L frontal cortex infarct. Age indeterminate R superior cerebellar infarct. Small vessel disease. ASPECTS 9.    CTA head & neck no LVA. Atherosclerosis overall, L M3. Layering pleural effusions. RUL airspace dz   MRI  Scattered L MCA infarcts. Punctate subacute R cerebellar infarct. Mult old cerebellar infarcts. Small vessel disease.   2D Echo  pending   LDL 94  HgbA1c added to labs this am  IV heparin for VTE prophylaxis  NPO. SLP assessed and following Diet Order            Diet NPO time specified  Diet effective now               aspirin 81 mg daily prior to admission, now on heparin IV. Recommend change to eliquis 2.5 bid once able to swallow or permanent feeding option in place.  Therapy recommendations:  SNF. PT and OT evals pending. Ok to be OOB from stroke standpoint  Disposition:  pending   Atrial Fibrillation w/ RVR  Home anticoagulation:  none   Now on IV heparin and IV cardizem  CHA2DS2-VASc Score = at least 6, ?2 oral anticoagulation recommended  Age in Years:  ?[age]   +2    Sex:  Female   Female   +1    Hypertension History:  yes   +1     Diabetes Mellitus:  0 Congestive Heart Failure History:  0  Vascular Disease History:  0     Stroke/TIA/Thromboembolism History:  yes   +2 . Recommend Eliquis (apixaban) daily at discharge for secondary stroke prevention   Hypertension  Stable . Permissive hypertension (OK if < 220/120) but gradually normalize in 5-7 days . Long-term BP goal normotensive  Hyperlipidemia  Home meds:  No statin  Now on lipitor 40  LDL 94, goal < 70  Continue statin at discharge  Other Stroke Risk Factors  Advanced age  Hx stroke/TIA  11/2011 - Acute onset dizziness. MRI neg. TIA vs acute dehydration while taking a diuretic.   Family hx stroke (mother, sister)  Other Active Problems  Possible aspiration PNA, on abx  AKI Cr  1.08  Hypothyroidism  Hypokalemia 3.3  Hospital day # 1  Jeryn MainSharon Biby, MSN, APRN, ANVP-BC, AGPCNP-BC Advanced Practice Stroke Nurse [facility] See Amion for Schedule & Pager information 09/17/2017 9:14 AM   ATTENDING NOTE: I reviewed above note and agree with the assessment and plan. I have made any additions or clarifications directly to the above note. Pt was seen and examined.   84 year old female with history of anxiety, depression, autoimmune hepatitis, hypertension, hypothyroidism, atrial fibrillation not on  anticoagulation, nursing home resident and recent L1 compression fracture status post kyphoplasty admitted for right facial droop and aphasia.  CT showed right cerebellum infarct.  CTA head and neck unremarkable except left M3 atherosclerosis.  MRI showed right cerebellum and left MCA scattered infarcts.  EF 55 to 60%.  LDL 94 and A1c 6.0.  In ER, EKG showed A. fib RVR.  Potassium 2.7.  He was treated with aspirin PR, Lipitor 40 and put on heparin IV with Cardizem IV.  On examination, patient not cooperative for exam, agitated during exam.  Global aphasia, not following commands, no spontaneous speech, not able to repeat her name.  Facial symmetrical.  Bilateral upper extremity symmetrical 3/5, bilateral lower extremity symmetrical withdraw to pain.  Patient stroke embolic pattern, likely due to A. fib not on AC.  Currently on heparin drip.  However, did not pass swallow.  Discussed with Dr. Rhona Leavens, agree with palliative care involvement.  If aggressive medical care desired, may consider switch heparin drip to DOACs once p.o. access. Will follow.   Marvel Plan, MD PhD Stroke Neurology 09/17/2017 4:35 PM     To contact Stroke Continuity provider, please refer to [email or web address]. After hours, contact General Neurology

## 2017-09-17 NOTE — Progress Notes (Signed)
PT Cancellation Note  Patient Details Name: Raymon Muttonnnie S Donna MRN: [id number] DOB: 20-Feb-1924   Cancelled Treatment:    Reason Eval/Treat Not Completed: Active bedrest order Will need increased activity orders. Will follow-up as time allows.   Kipp LaurenceStephanie R Mykelle Cockerell, PT, DPT 09/17/17 8:12 AM Pager: [phone]

## 2017-09-17 NOTE — Progress Notes (Signed)
OT Cancellation Note  Patient Details Name: Marisa Smith MRN: [id number] DOB: [birth year]   Cancelled Treatment:    Reason Eval/Treat Not Completed: Active bedrest order  [facility],HILLARY  Annjanette Wertenberger, OT/L  OT Clinical Specialist [phone]  09/17/2017, 8:54 AM

## 2017-09-17 NOTE — Progress Notes (Signed)
Initial Nutrition Assessment  DOCUMENTATION CODES:   Underweight  INTERVENTION:    If TF desired recommend:  Jevity 1.2 via Cortrak 10 F feeding tube at goal rate of 40 ml/hr  Provides 1152 kcals, 53 gm protein, 774 ml free water daily  NUTRITION DIAGNOSIS:   Inadequate oral intake related to dysphagia, s/p CVA as evidenced by NPO status  GOAL:   Patient will meet greater than or equal to 90% of their needs vs palliative care?  MONITOR:   Labs, Weight trends, Skin, I & O's, Goals of care  REASON FOR ASSESSMENT:   Consult Other (Comment)(CVA)  ASSESSMENT:   [name] with hypothyroidism, recurrent headache, autoimmune hepatitis, atrial fibrillation not on anticoagulant brought here via EMS for stroke symptoms.   RD unable to obtain nutrition hx. Pt is nonverbal. Laying in bed with mittens on.  [facility]  MRI revealed multifocal acute strokes. Nephrology following. Pt with severe dysphagia. SLP recommending NPO status. Labs & medications reviewed. Na 132 (L). K 3.3 (L).   NUTRITION - FOCUSED PHYSICAL EXAM:  Unable to complete at this time, however, highly suspect malnutrition.  Diet Order:   Diet Order            Diet NPO time specified  Diet effective now             EDUCATION NEEDS:   Not appropriate for education at this time  Skin:  Skin Assessment: Reviewed RN Assessment  Last BM:  8/15  Height:   Ht Readings from Last 1 Encounters:  09/16/17 5\' 7"  (1.702 m)   Weight:   Wt Readings from Last 1 Encounters:  09/16/17 50.7 kg   Ideal Body Weight:  61.3 kg  BMI:  Body mass index is 17.51 kg/m.  Estimated Nutritional Needs:   Kcal:  1000-1300  Protein:  50-65 gm  Fluid:  >/= 1.5 L  Maureen ChattersKatie Simranjit Thayer, RD, LDN Pager #: [phone] After-Hours Pager #: [phone]

## 2017-09-17 NOTE — Progress Notes (Signed)
ANTICOAGULATION CONSULT NOTE - Initial Consult  Pharmacy Consult for Heparin Indication: atrial fibrillation and stroke  Allergies  Allergen Reactions  . Amlodipine Besylate     REACTION: itching  . Azatadine     Per MAR?  [name] Kitchen. Azathioprine     REACTION: nausea  . Penicillins     REACTION: yeast infection Per MAR  . Sertraline Hcl     REACTION: rash  . Sulfonamide Derivatives     REACTION: swelling and itching    Patient Measurements: Height: 5\' 7"  (170.2 cm) Weight: 111 lb 12.4 oz (50.7 kg) IBW/kg (Calculated) : 61.6 Heparin Dosing Weight: 50.7kg  Vital Signs: Temp: 98.6 F (37 C) (08/15 1148) Temp Source: Oral (08/15 1148) BP: 109/87 (08/15 1148) Pulse Rate: 95 (08/15 0850)  Labs: Recent Labs    09/16/17 1233 09/16/17 1240 09/17/17 0218 09/17/17 1104  HGB 13.3 13.9  --   --   HCT 39.6 41.0  --   --   PLT 264  --   --   --   APTT 31  --   --   --   LABPROT 13.8  --  14.6  --   INR 1.07  --  1.15  --   HEPARINUNFRC  --   --  <0.10* 0.23*  CREATININE 1.08* 1.00 0.91  --     Estimated Creatinine Clearance: 32.2 mL/min (by C-G formula based on SCr of 0.91 mg/dL).   Medical History: Past Medical History:  Diagnosis Date  . ALLERGIC RHINITIS CAUSE UNSPECIFIED   . ANXIETY   . Autoimmune hepatitis (HCC)    on MP6, prev pred  . DEPRESSION, MAJOR, MODERATE   . Headache(784.0) 02/15/2010  . HYPERTENSION, BENIGN ESSENTIAL   . HYPOTHYROIDISM   . OSTEOPOROSIS    fx L prox hum and L distal radius, both requiring ORIF 02/2013  . Unspecified vitamin D deficiency     Medications:  Infusions:  . sodium chloride 50 mL/hr at 09/17/17 0653  . diltiazem (CARDIZEM) infusion 5 mg/hr (09/17/17 0924)  . heparin 750 Units/hr (09/17/17 0327)    Assessment: 89 yof presented to the ED as a Code Stroke. Found to be in afib and now starting IV heparin. Will not bolus and will do low dose per stroke protocol. Baseline CBC Is WNL and pt is not on anticoagulation PTA.   8/15  am update: heparin level 0.23  Goal of Therapy:  Heparin level 0.3-0.5 units/ml Monitor platelets by anticoagulation protocol: Yes   Plan:  Inc heparin drip to 850 units/hr Heparin level in 8 hours Daily heparin level  Sadi Arave A. Jeanella CrazePierce, PharmD, BCPS Clinical Pharmacist [place] Pager: [phone] Please utilize Amion for appropriate phone number to reach the unit pharmacist [facility]    09/17/2017,12:57 PM

## 2017-09-17 NOTE — Progress Notes (Signed)
PROGRESS NOTE    Marisa Smith  XFG:[id number] DOB: 11-17-1924 DOA: 09/16/2017 PCP: Lajean Manes, MD    Brief Narrative:  84 y.o. female with medical history significant of hypothyroidism; HTN; afib not on AC; and autoimmune hepatitis presenting with "stroke symptoms."   She was admitted 6/24-26 for an acute compression fracture at L1 complicated by hyponatremia.   She has marked aphasia and is unable to effectively communicate, but she does appear to be A&O x 2.  Further history is not able to be obtained at this time.  HPI per Dr. Gilford Raid: Pt presents to the ED today as a stroke alert. The pt has a hx of afib not on anticoagulation. She was lsn at 1100 at her living facility. Nurse practitioner found her with right facial droop and aphasia around noon. Stroke team met pt at the bridge and she went immediately to CT. Pt has a right sided facial droop and is in afib with rvr. The pt did have a mri which showed cva. Pt d/w [facility] for admission   ED Course:  H/o afib, not on AC.  MRI with multifocal acute strokes - R facial droop, dysarthria.  Neurology has seen - can be started on Heparin if appropriate without bolus.  Had RVR, given Dilt.  Possible aspiration PNA, started on antibiotics.  Low K+- repleted  Assessment & Plan:   Principal Problem:   CVA (cerebral vascular accident) ([place]) Active Problems:   Hypothyroidism   HYPERTENSION, BENIGN ESSENTIAL   A-fib ([place])   AKI (acute kidney injury) ([place])  CVA -Patient with h/o afib not on AC, presented with acute R facial droop and aphasia -MRI/CTA confirms multiple foci of acute ischemia within the left hemisphere with the largest lesions located in the left frontal lobe.  Also with punctate subacute right cerebellar infarct and multiple old cerebellar infarcts. -Continued on tele -Echo performed, pending results -Therapy recs for SNF -SLP recommendations for NPO with alternative means of nutrition/meds -SW consult for  placement -Neurology recommendations thus far for heparin gtt with eliquis when able to take meds  HTN -Allowing permissive HTN -Treat BP only if >220/120, and then with goal of 15% reduction -Neurology recommends gradual normalization of BP in the next 5-7 days  HLD -LDL noted to be 94  Afib, not on AC -Rate controlled at home on Verapamil -Patient currently on cardizem gtt, relatively rate controlled -Continued on heparin gtt -Will need to discuss with POA regarding goals of care. If patient desires feeding tube, then would transition to PO meds with anticoagulation  AKI -Baseline creatinine 0.6, currently 1.08 -Cr has improved with IVF hydration  Hypothyroidism -TSH elevated, however free t4 within normal range -Cont with replacement  DVT prophylaxis: Heparin gtt Code Status: DNR Family Communication: Pt in room, family not at bedside Disposition Plan: Uncertain at this time  Consultants:   Neurology  Procedures:     Antimicrobials: Anti-infectives (From admission, onward)   Start     Dose/Rate Route Frequency Ordered Stop   09/17/17 1000  vancomycin (VANCOCIN) 500 mg in sodium chloride 0.9 % 100 mL IVPB  Status:  Discontinued     500 mg 100 mL/hr over 60 Minutes Intravenous Every 24 hours 09/16/17 1433 09/16/17 1541   09/16/17 1500  ceFEPIme (MAXIPIME) 2 g in sodium chloride 0.9 % 100 mL IVPB     2 g 200 mL/hr over 30 Minutes Intravenous  Once 09/16/17 1428 09/16/17 1601   09/16/17 1445  vancomycin (VANCOCIN) IVPB 1000  mg/200 mL premix     1,000 mg 200 mL/hr over 60 Minutes Intravenous  Once 09/16/17 1433 09/16/17 1610       Subjective: Unable to assess as pt is non-verbal  Objective: Vitals:   09/17/17 0650 09/17/17 0800 09/17/17 0850 09/17/17 1148  BP: 118/79  (!) 139/102 109/87  Pulse: 80  95   Resp: (!) 25  (!) 27   Temp:  98.1 F (36.7 C)  98.6 F (37 C)  TempSrc:  Axillary Oral Oral  SpO2: 94%  96%   Weight:      Height:         Intake/Output Summary (Last 24 hours) at 09/17/2017 1519 Last data filed at 09/17/2017 1200 Gross per 24 hour  Intake 1247.17 ml  Output 450 ml  Net 797.17 ml   Filed Weights   09/16/17 1200  Weight: 50.7 kg    Examination:  General exam: Appears calm and comfortable  Respiratory system: Clear to auscultation. Respiratory effort normal. Cardiovascular system: S1 & S2 heard, irregularly irregular Gastrointestinal system: Abdomen is nondistended, soft and nontender. No organomegaly or masses felt. Normal bowel sounds heard. Central nervous system: Alert, seems confused, difficult to fully assess Extremities: Symmetric 5 x 5 power. Skin: No rashes, lesions  Psychiatry: Unable to assess given current mentation  Data Reviewed: I have personally reviewed following labs and imaging studies  CBC: Recent Labs  Lab 09/16/17 1233 09/16/17 1240  WBC 9.2  --   NEUTROABS 6.6  --   HGB 13.3 13.9  HCT 39.6 41.0  MCV 99.2  --   PLT 264  --    Basic Metabolic Panel: Recent Labs  Lab 09/16/17 1233 09/16/17 1240 09/16/17 1500 09/17/17 0218  NA 134* 134*  --  132*  K 2.7* 2.7*  --  3.3*  CL 94* 94*  --  99  CO2 26  --   --  22  GLUCOSE 102* 98  --  91  BUN 19 21  --  16  CREATININE 1.08* 1.00  --  0.91  CALCIUM 9.1  --   --  8.3*  MG  --   --  2.0  --   PHOS  --   --  2.8  --    GFR: Estimated Creatinine Clearance: 32.2 mL/min (by C-G formula based on SCr of 0.91 mg/dL). Liver Function Tests: Recent Labs  Lab 09/16/17 1233  AST 19  ALT 10  ALKPHOS 126  BILITOT 1.3*  PROT 7.0  ALBUMIN 3.2*   No results for input(s): LIPASE, AMYLASE in the last 168 hours. No results for input(s): AMMONIA in the last 168 hours. Coagulation Profile: Recent Labs  Lab 09/16/17 1233 09/17/17 0218  INR 1.07 1.15   Cardiac Enzymes: No results for input(s): CKTOTAL, CKMB, CKMBINDEX, TROPONINI in the last 168 hours. BNP (last 3 results) No results for input(s): PROBNP in the last  8760 hours. HbA1C: Recent Labs    09/17/17 0935  HGBA1C 6.0*   CBG: Recent Labs  Lab 09/16/17 1233  GLUCAP 94   Lipid Profile: Recent Labs    09/17/17 0218  CHOL 155  HDL 47  LDLCALC 94  TRIG 71  CHOLHDL 3.3   Thyroid Function Tests: Recent Labs    09/16/17 1500 09/16/17 1851  TSH 5.627*  --   FREET4  --  1.60   Anemia Panel: No results for input(s): VITAMINB12, FOLATE, FERRITIN, TIBC, IRON, RETICCTPCT in the last 72 hours. Sepsis Labs: No results for input(s):  PROCALCITON, LATICACIDVEN in the last 168 hours.  No results found for this or any previous visit (from the past 240 hour(s)).   Radiology Studies: Ct Angio Head W Or Wo Contrast  Result Date: 09/16/2017 CLINICAL DATA:  Focal neuro deficit with stroke suspected EXAM: CT ANGIOGRAPHY HEAD AND NECK TECHNIQUE: Multidetector CT imaging of the head and neck was performed using the standard protocol during bolus administration of intravenous contrast. Multiplanar CT image reconstructions and MIPs were obtained to evaluate the vascular anatomy. Carotid stenosis measurements (when applicable) are obtained utilizing NASCET criteria, using the distal internal carotid diameter as the denominator. CONTRAST:  59m ISOVUE-370 IOPAMIDOL (ISOVUE-370) INJECTION 76% COMPARISON:  Noncontrast head CT earlier today FINDINGS: CTA NECK FINDINGS Aortic arch: Atherosclerotic calcification. The brachiocephalic origin is not covered. Three vessel branching pattern. Right carotid system: No stenosis, beading, or significant atheromatous change. Left carotid system: Minor atheromatous changes at the bifurcation. Vessels are smooth and widely patent. Vertebral arteries: Calcified plaque at the proximal subclavian on the left. Focal narrowing of the distal left V2 segment measuring 40%. No dissection or flow limiting stenosis. Skeleton: Degenerative changes without acute or aggressive finding Other neck: No evidence of mass or inflammation Upper  chest: Layering pleural effusions partially seen on both sides. There is chronic biapical pleural based scarring. Small subpleural opacity in the right upper lobe, indistinct and likely inflammatory. Review of the MIP images confirms the above findings CTA HEAD FINDINGS Anterior circulation: Mild atherosclerotic plaque on the carotid siphons. No branch occlusion or major vessel flow limiting stenosis. There is atherosclerotic irregularity of the left M3 branch of note given the noncontrast CT findings. Negative for aneurysm. Posterior circulation: Small vertebral and basilar arteries in the setting of fetal type bilateral PCA. No flow limiting stenosis or branch occlusion. Venous sinuses: Patent Anatomic variants: None significant Delayed phase: Not obtained in the emergent setting Review of the MIP images confirms the above findings IMPRESSION: 1. No large vessel occlusion. 2. Overall mild for age atherosclerosis without flow limiting stenosis in the major vessels. 3. Focal atherosclerotic irregularity of a left M3 branch of note given the preceding head CT findings. 4. There are layering pleural effusions. Small focus of airspace disease in the right upper lobe that is indistinct and likely inflammatory. Electronically Signed   By: JMonte FantasiaM.D.   On: 09/16/2017 13:17   Ct Angio Neck W Or Wo Contrast  Result Date: 09/16/2017 CLINICAL DATA:  Focal neuro deficit with stroke suspected EXAM: CT ANGIOGRAPHY HEAD AND NECK TECHNIQUE: Multidetector CT imaging of the head and neck was performed using the standard protocol during bolus administration of intravenous contrast. Multiplanar CT image reconstructions and MIPs were obtained to evaluate the vascular anatomy. Carotid stenosis measurements (when applicable) are obtained utilizing NASCET criteria, using the distal internal carotid diameter as the denominator. CONTRAST:  549mISOVUE-370 IOPAMIDOL (ISOVUE-370) INJECTION 76% COMPARISON:  Noncontrast head CT  earlier today FINDINGS: CTA NECK FINDINGS Aortic arch: Atherosclerotic calcification. The brachiocephalic origin is not covered. Three vessel branching pattern. Right carotid system: No stenosis, beading, or significant atheromatous change. Left carotid system: Minor atheromatous changes at the bifurcation. Vessels are smooth and widely patent. Vertebral arteries: Calcified plaque at the proximal subclavian on the left. Focal narrowing of the distal left V2 segment measuring 40%. No dissection or flow limiting stenosis. Skeleton: Degenerative changes without acute or aggressive finding Other neck: No evidence of mass or inflammation Upper chest: Layering pleural effusions partially seen on both sides. There is chronic  biapical pleural based scarring. Small subpleural opacity in the right upper lobe, indistinct and likely inflammatory. Review of the MIP images confirms the above findings CTA HEAD FINDINGS Anterior circulation: Mild atherosclerotic plaque on the carotid siphons. No branch occlusion or major vessel flow limiting stenosis. There is atherosclerotic irregularity of the left M3 branch of note given the noncontrast CT findings. Negative for aneurysm. Posterior circulation: Small vertebral and basilar arteries in the setting of fetal type bilateral PCA. No flow limiting stenosis or branch occlusion. Venous sinuses: Patent Anatomic variants: None significant Delayed phase: Not obtained in the emergent setting Review of the MIP images confirms the above findings IMPRESSION: 1. No large vessel occlusion. 2. Overall mild for age atherosclerosis without flow limiting stenosis in the major vessels. 3. Focal atherosclerotic irregularity of a left M3 branch of note given the preceding head CT findings. 4. There are layering pleural effusions. Small focus of airspace disease in the right upper lobe that is indistinct and likely inflammatory. Electronically Signed   By: Monte Fantasia M.D.   On: 09/16/2017 13:17    Mr Brain Wo Contrast  Result Date: 09/16/2017 CLINICAL DATA:  Right facial droop and aphasia. EXAM: MRI HEAD WITHOUT CONTRAST TECHNIQUE: Multiplanar, multiecho pulse sequences of the brain and surrounding structures were obtained without intravenous contrast. COMPARISON:  Head CT 09/16/2017 FINDINGS: BRAIN: Multifocal acute ischemia within the left hemisphere, including the left frontal white matter, base of the left precentral gyrus and posterior left insula. There is an additional focus in the posterior left temporal lobe. There is also a focus of hyperintensity on diffusion-weighted imaging in the right cerebellar hemisphere without corresponding abnormality on the ADC map. The midline structures are normal. There are old bilateral cerebellar infarcts. Diffuse confluent hyperintense T2-weighted signal within the periventricular, deep and juxtacortical white matter, most commonly due to chronic ischemic microangiopathy. Generalized atrophy without lobar predilection. Susceptibility-sensitive sequences show no chronic microhemorrhage or superficial siderosis. VASCULAR: Major intracranial arterial and venous sinus flow voids are preserved. SKULL AND UPPER CERVICAL SPINE: The visualized skull base, calvarium, upper cervical spine and extracranial soft tissues are normal. SINUSES/ORBITS: Left mastoid effusion. Paranasal sinuses are clear. There are bilateral lens replacements. IMPRESSION: 1. Multiple foci of acute ischemia within the left hemisphere with the largest lesions located in the left frontal lobe. No hemorrhage or mass effect. 2. Punctate subacute right cerebellar infarct suspected on the basis of DWI hyperintensity without corresponding decrease of apparent diffusion coefficient. 3. Multiple old cerebellar infarcts and advanced chronic ischemic microangiopathy. Electronically Signed   By: Ulyses Jarred M.D.   On: 09/16/2017 14:23   Dg Chest Portable 1 View  Result Date: 09/16/2017 CLINICAL DATA:   Initial evaluation for acute shortness of breath. EXAM: PORTABLE CHEST 1 VIEW COMPARISON:  Prior CT from 11/30/2016 FINDINGS: Mild cardiomegaly, stable. Mediastinal silhouette within normal limits. Tortuosity the intrathoracic aorta noted. Lungs mildly hypoinflated. Moderate layering left pleural effusion. Associated left basilar opacity may reflect atelectasis or infiltrate. Perihilar vascular congestion without overt pulmonary edema. No pneumothorax. No acute osseus abnormality. Diffuse osteopenia. Prior ORIF at the proximal left humerus. IMPRESSION: 1. Moderate layering left pleural effusion. Associated left basilar opacity likely atelectasis, although infiltrate could be considered in the correct clinical setting. 2. Cardiomegaly with mild diffuse pulmonary vascular congestion. Electronically Signed   By: Jeannine Boga M.D.   On: 09/16/2017 13:19   Ct Head Code Stroke Wo Contrast  Result Date: 09/16/2017 CLINICAL DATA:  Code stroke. Unexplained altered level of consciousness. Right-sided facial  paralysis EXAM: CT HEAD WITHOUT CONTRAST TECHNIQUE: Contiguous axial images were obtained from the base of the skull through the vertex without intravenous contrast. COMPARISON:  02/05/2016 FINDINGS: Brain: Small area of age-indeterminate infarction in the high left frontal cortex. Small area of age-indeterminate infarction in the right superior cerebellum (just above a smaller and chronic infarct in the right cerebellum seen previously). Small remote left cerebellar infarct. Confluent chronic small vessel ischemic gliosis in the cerebral white matter. No acute hemorrhage, hydrocephalus, or masslike finding Vascular: No high-density vessel.  Atherosclerotic calcification. Skull: Negative Sinuses/Orbits: Bilateral cataract resection Other: These results were called by telephone at the time of interpretation on 09/16/2017 at 12:46 pm to Dr. Rory Percy , who was already aware of findings. ASPECTS [facility] Stroke Program  Early CT Score) - Ganglionic level infarction (caudate, lentiform nuclei, internal capsule, insula, M1-M3 cortex): 6 - Supraganglionic infarction (M4-M6 cortex): 3 Total score (0-10 with 10 being normal): 9 IMPRESSION: 1. Small age-indeterminate infarct in the left frontal cortex. ASPECTS is 9. 2. Small age-indeterminate right superior cerebellar infarct. 3. Chronic small vessel ischemia. Electronically Signed   By: Monte Fantasia M.D.   On: 09/16/2017 12:49    Scheduled Meds: .  stroke: mapping our early stages of recovery book   Does not apply Once  . aspirin  300 mg Rectal Daily   Or  . aspirin  325 mg Oral Daily  . atorvastatin  40 mg Oral q1800  . LORazepam  0.5 mg Intravenous Q12H  . polyvinyl alcohol  1 drop Both Eyes TID   Continuous Infusions: . sodium chloride 50 mL/hr at 09/17/17 0653  . diltiazem (CARDIZEM) infusion 5 mg/hr (09/17/17 0924)  . heparin 850 Units/hr (09/17/17 1259)     LOS: 1 day   Marylu Lund, MD [facility] Pager [phone]  If 7PM-7AM, please contact night-coverage [email or web address] Password [email or web address] 09/17/2017, 3:19 PM

## 2017-09-18 ENCOUNTER — Other Ambulatory Visit: Payer: Self-pay

## 2017-09-18 ENCOUNTER — Inpatient Hospital Stay (HOSPITAL_COMMUNITY): Payer: Medicare Other

## 2017-09-18 ENCOUNTER — Encounter (HOSPITAL_COMMUNITY): Payer: Self-pay | Admitting: General Practice

## 2017-09-18 DIAGNOSIS — Z7189 Other specified counseling: Secondary | ICD-10-CM

## 2017-09-18 DIAGNOSIS — I10 Essential (primary) hypertension: Secondary | ICD-10-CM

## 2017-09-18 DIAGNOSIS — F411 Generalized anxiety disorder: Secondary | ICD-10-CM

## 2017-09-18 DIAGNOSIS — Z515 Encounter for palliative care: Secondary | ICD-10-CM

## 2017-09-18 DIAGNOSIS — J690 Pneumonitis due to inhalation of food and vomit: Secondary | ICD-10-CM

## 2017-09-18 DIAGNOSIS — N179 Acute kidney failure, unspecified: Secondary | ICD-10-CM

## 2017-09-18 DIAGNOSIS — R4701 Aphasia: Secondary | ICD-10-CM

## 2017-09-18 DIAGNOSIS — I4891 Unspecified atrial fibrillation: Secondary | ICD-10-CM

## 2017-09-18 LAB — CBC
HCT: 37.4 % (ref 36.0–46.0)
Hemoglobin: 12.2 g/dL (ref 12.0–15.0)
MCH: 32.9 pg (ref 26.0–34.0)
MCHC: 32.6 g/dL (ref 30.0–36.0)
MCV: 100.8 fL — ABNORMAL HIGH (ref 78.0–100.0)
PLATELETS: 240 10*3/uL (ref 150–400)
RBC: 3.71 MIL/uL — ABNORMAL LOW (ref 3.87–5.11)
RDW: 13.8 % (ref 11.5–15.5)
WBC: 7.7 10*3/uL (ref 4.0–10.5)

## 2017-09-18 LAB — HEPARIN LEVEL (UNFRACTIONATED): Heparin Unfractionated: 0.49 IU/mL (ref 0.30–0.70)

## 2017-09-18 LAB — BASIC METABOLIC PANEL
Anion gap: 14 (ref 5–15)
BUN: 19 mg/dL (ref 8–23)
CALCIUM: 8.3 mg/dL — AB (ref 8.9–10.3)
CO2: 18 mmol/L — ABNORMAL LOW (ref 22–32)
CREATININE: 0.89 mg/dL (ref 0.44–1.00)
Chloride: 101 mmol/L (ref 98–111)
GFR, EST NON AFRICAN AMERICAN: 55 mL/min — AB (ref >60)
Glucose, Bld: 69 mg/dL — ABNORMAL LOW (ref 70–99)
Potassium: 3.5 mmol/L (ref 3.5–5.1)
Sodium: 133 mmol/L — ABNORMAL LOW (ref 135–145)

## 2017-09-18 MED ORDER — LORAZEPAM 0.5 MG PO TABS
0.5000 mg | ORAL_TABLET | Freq: Two times a day (BID) | ORAL | Status: DC
  Administered 2017-09-18 – 2017-09-21 (×6): 0.5 mg via ORAL
  Filled 2017-09-18 (×6): qty 1

## 2017-09-18 MED ORDER — DILTIAZEM HCL 30 MG PO TABS
60.0000 mg | ORAL_TABLET | Freq: Four times a day (QID) | ORAL | Status: DC
  Administered 2017-09-18 – 2017-09-20 (×7): 60 mg via ORAL
  Filled 2017-09-18 (×7): qty 2

## 2017-09-18 MED ORDER — APIXABAN 2.5 MG PO TABS
2.5000 mg | ORAL_TABLET | Freq: Two times a day (BID) | ORAL | Status: DC
  Administered 2017-09-18 – 2017-09-21 (×6): 2.5 mg via ORAL
  Filled 2017-09-18 (×6): qty 1

## 2017-09-18 MED ORDER — LEVOTHYROXINE SODIUM 50 MCG PO TABS
50.0000 ug | ORAL_TABLET | Freq: Every day | ORAL | Status: DC
  Administered 2017-09-19 – 2017-09-21 (×3): 50 ug via ORAL
  Filled 2017-09-18 (×3): qty 1

## 2017-09-18 MED ORDER — LACTULOSE 10 GM/15ML PO SOLN: 20.0000 g | Freq: Two times a day (BID) | ORAL | Status: DC | PRN

## 2017-09-18 MED ORDER — HYDROCODONE-ACETAMINOPHEN 5-325 MG PO TABS
1.0000 | ORAL_TABLET | Freq: Four times a day (QID) | ORAL | Status: DC | PRN
  Filled 2017-09-18: qty 1

## 2017-09-18 MED ORDER — GLYCERIN-HYPROMELLOSE-PEG 400 0.2-0.36-1 % OP SOLN: 1.0000 [drp] | Freq: Three times a day (TID) | OPHTHALMIC | Status: DC

## 2017-09-18 NOTE — Care Management Important Message (Signed)
Important Message  Patient Details  Name: Marisa Smith MRN: [id number] Date of Birth: 1924-05-27   Medicare Important Message Given:  No Due to illness patient did not sign   Dorena BodoIris Parmvir Boomer 09/18/2017, 3:37 PM

## 2017-09-18 NOTE — Consult Note (Addendum)
Consultation Note Date: 09/18/2017   Patient Name: Marisa Smith  DOB: 03-01-1924  MRN: [id number]  Age / Sex: 84 y.o., female  PCP: Lajean Manes, MD Referring Physician: Donne Hazel, MD  Reason for Consultation: Establishing goals of care and Psychosocial/spiritual support  HPI/Patient Profile: 84 y.o. female  with past medical history of hypothyroidism, hypertension, atrial fib not on anticoagulation, autoimmune hepatitis admitted on 09/16/2017 with strokelike symptoms..    She has marked aphasia.  MRI revealed multifocal acute strokes, left MCA.   Consult ordered for goals of care, dysphasia  Clinical Assessment and Goals of Care: Patient seen, chart reviewed.  Met with patient's daughter, Marisa Smith.  Patient has had significant dysphasia and underwent a modified barium swallow on 09/18/2017 where a dysphasia 1 (pure) with thin liquid diet was recommended.  She will continue to be a high risk for inadequate oral intake and I shared this with family.  Chart review, patient has stated she would not want a feeding tube  Patient is able to share some of her wishes regarding advanced care planning.  Her healthcare proxy is her daughter, Marisa Smith at [phone]  Defined terms of dysphasia, aspiration pneumonia and what to expect going forward in the setting of an acute CVA.     SUMMARY OF RECOMMENDATIONS   DNR Continue with dysphasia 1 diet and speech recommendations Disposition to skilled nursing facility for rehab with palliative medicine in the community to follow.  Palliative medicine in the community can be accessed either through care connection at [phone] or hospice and palliative care of 's palliative medicine division at [phone]  Code Status/Advance Care Planning:  DNR    Symptom Management:   Anxiety continue Ativan 0.5 mg twice daily.  Monitor for need  to change to oral route as part of discharge planning  Palliative Prophylaxis:   Aspiration, Bowel Regimen, Delirium Protocol, Eye Care, Frequent Pain Assessment, Oral Care and Turn Reposition  Additional Recommendations (Limitations, Scope, Preferences):  No Artificial Feeding  Psycho-social/Spiritual:   Desire for further Chaplaincy support:no  Additional Recommendations: Referral to Community Resources   Prognosis:   Unable to determine  Discharge Planning: [facility] for rehab with Palliative care service follow-up      Primary Diagnoses: Present on Admission: . A-fib ([place]) . HYPERTENSION, BENIGN ESSENTIAL . Hypothyroidism . CVA (cerebral vascular accident) ([place]) . AKI (acute kidney injury) ([place])   I have reviewed the medical record, interviewed the patient and family, and examined the patient. The following aspects are pertinent.  Past Medical History:  Diagnosis Date  . ALLERGIC RHINITIS CAUSE UNSPECIFIED   . ANXIETY   . Autoimmune hepatitis ([place])    on MP6, prev pred  . DEPRESSION, MAJOR, MODERATE   . Headache(784.0) 02/15/2010  . HYPERTENSION, BENIGN ESSENTIAL   . HYPOTHYROIDISM   . OSTEOPOROSIS    fx L prox hum and L distal radius, both requiring ORIF 02/2013  . Unspecified vitamin D deficiency    Social History   Socioeconomic History  .  Marital status: Widowed    Spouse name: Not on file  . Number of children: 1  . Years of education: Not on file  . Highest education level: Not on file  Occupational History    Employer: RETIRED  Social Needs  . Financial resource strain: Not on file  . Food insecurity:    Worry: Not on file    Inability: Not on file  . Transportation needs:    Medical: Not on file    Non-medical: Not on file  Tobacco Use  . Smoking status: Never Smoker  . Smokeless tobacco: Never Used  . Tobacco comment: Married, Retired- daughter is [name] responsible for majority of supervison for parents    Substance and Sexual Activity  . Alcohol use: No    Alcohol/week: 0.0 standard drinks  . Drug use: No  . Sexual activity: Not Currently  Lifestyle  . Physical activity:    Days per week: Not on file    Minutes per session: Not on file  . Stress: Not on file  Relationships  . Social connections:    Talks on phone: Not on file    Gets together: Not on file    Attends religious service: Not on file    Active member of club or organization: Not on file    Attends meetings of clubs or organizations: Not on file    Relationship status: Not on file  Other Topics Concern  . Not on file  Social History Narrative  . Not on file   Family History  Problem Relation Age of Onset  . Hypertension Mother   . Stroke Mother   . Heart disease Father   . Stroke Sister    Scheduled Meds: .  stroke: mapping our early stages of recovery book   Does not apply Once  . aspirin  300 mg Rectal Daily   Or  . aspirin  325 mg Oral Daily  . atorvastatin  40 mg Oral q1800  . ketorolac  15 mg Intravenous Q6H  . LORazepam  0.5 mg Intravenous Q12H  . polyvinyl alcohol  1 drop Both Eyes TID   Continuous Infusions: . sodium chloride 1,000 mL (09/18/17 0534)  . diltiazem (CARDIZEM) infusion 7.5 mg/hr (09/17/17 2341)  . heparin 850 Units/hr (09/18/17 0348)   PRN Meds:.acetaminophen **OR** acetaminophen (TYLENOL) oral liquid 160 mg/5 mL **OR** acetaminophen Medications Prior to Admission:  Prior to Admission medications   Medication Sig Start Date End Date Taking? Authorizing Provider  acetaminophen (TYLENOL) 325 MG tablet Take 325 mg by mouth every 4 (four) hours as needed for mild pain (right hip pain).   Yes [provider]  acetaminophen (TYLENOL) 500 MG tablet Take 1,000 mg by mouth every 6 (six) hours as needed for mild pain or headache.    Yes [provider]  aspirin EC 81 MG EC tablet Take 81 mg by mouth daily.    Yes [provider]  busPIRone (BUSPAR) 15 MG tablet  Take 15 mg by mouth 2 (two) times daily.  12/20/13  Yes [provider]  Calcium Carbonate-Vitamin D 600-200 MG-UNIT CAPS Take 1 capsule by mouth daily.   Yes [provider]  cetirizine (ZYRTEC) 10 MG tablet Take 10 mg by mouth daily.   Yes [provider]  Cholecalciferol (VITAMIN D3) 1000 units CAPS Take 1,000 Units by mouth daily.    Yes [provider]  Eyelid Cleansers (OCUSOFT LID SCRUB) PADS Place 1 each into both eyes 2 (two)  times daily.   Yes [provider]  HYDROcodone-acetaminophen (NORCO) 5-325 MG tablet Take 1 tablet by mouth every 6 (six) hours as needed for moderate pain. Patient taking differently: Take 1 tablet by mouth every 4 (four) hours as needed for moderate pain (back pain).  03/03/16  Yes Avelina Laine, PA-C  lactulose (CHRONULAC) 10 GM/15ML solution Take 30 mLs by mouth 2 (two) times daily as needed.  07/24/17  Yes [provider]  levothyroxine (SYNTHROID, LEVOTHROID) 50 MCG tablet Take 1 tablet (50 mcg total) by mouth daily before breakfast. 05/03/13  Yes Rowe Clack, MD  LORazepam (ATIVAN) 0.5 MG tablet Take 0.5 mg by mouth 2 (two) times daily.  12/22/13  Yes [provider]  Menthol, Topical Analgesic, (BIOFREEZE ROLL-ON) 4 % GEL Apply 1 application topically 3 (three) times daily.   Yes [provider]  mercaptopurine (PURINETHOL) 50 MG tablet Take 0.5 tablets (25 mg total) by mouth daily. Give on an empty stomach 1 hour before or 2 hours after meals. Caution: Chemotherapy. 01/05/14  Yes Milus Banister, MD  mirtazapine (REMERON) 45 MG tablet Take 45 mg by mouth at bedtime.  12/20/13  Yes [provider]  ondansetron (ZOFRAN ODT) 8 MG disintegrating tablet Take 1 tablet (8 mg total) by mouth every 8 (eight) hours as needed for nausea or vomiting. 02/05/16  Yes Jola Schmidt, MD  Propylene Glycol-Glycerin (ARTIFICIAL TEARS) 1-0.3 % SOLN Place 1 drop into both eyes 3 (three) times daily.    Yes [provider]  senna (SENOKOT) 8.6 MG TABS tablet Take 1 tablet by mouth daily.   Yes [provider]  verapamil (VERELAN PM) 120 MG 24 hr capsule Take 120 mg by mouth at bedtime.   Yes [provider]  Glycerin-Hypromellose-PEG 400 0.2-0.36-1 % SOLN Place 1 drop into both eyes 3 (three) times daily.    [provider]   Allergies  Allergen Reactions  . Amlodipine Besylate     REACTION: itching  . Azatadine     Per MAR?  [name] Kitchen Azathioprine     REACTION: nausea  . Penicillins     REACTION: yeast infection Per MAR  . Sertraline Hcl     REACTION: rash  . Sulfonamide Derivatives     REACTION: swelling and itching   Review of Systems  Unable to perform ROS: Other    Physical Exam  Constitutional:  Frail, elderly female; somnolent, no acute distress  Cardiovascular: Normal rate.  Pulmonary/Chest: Effort normal.  Abdominal: Soft.  Neurological:  Somnolent  Skin: Skin is warm and dry. There is pallor.  Psychiatric:  No overt agitation otherwise unable to test  Nursing note and vitals reviewed.   Vital Signs: BP (!) 147/81 (BP Location: Left Arm)   Pulse 89   Temp 97.8 F (36.6 C) (Axillary)   Resp 20   Ht _0  (1.702 m)   Wt 50.7 kg   SpO2 96%   BMI 17.51 kg/m  Pain Scale: 0-10   Pain Score: 0-No pain   SpO2: SpO2: 96 % O2 Device:SpO2: 96 % O2 Flow Rate: .   IO: Intake/output summary:   Intake/Output Summary (Last 24 hours) at 09/18/2017 1354 Last data filed at 09/18/2017 0348 Gross per 24 hour  Intake 152.32 ml  Output -  Net 152.32 ml    LBM: Last BM Date: 09/17/17(small) Baseline Weight: Weight: 50.7 kg Most recent weight: Weight: 50.7 kg     Palliative Assessment/Data:   Flowsheet Rows  Most Recent Value  Intake Tab  Referral Department  Hospitalist  Unit at Time of Referral  Med/Surg Unit  Palliative Care Primary Diagnosis  Neurology  Date Notified  09/17/17  Palliative Care Type  New Palliative  care  Reason for referral  Clarify Goals of Care, Psychosocial or Spiritual support  Date of Admission  09/16/17  Date first seen by Palliative Care  09/18/17  # of days Palliative referral response time  1 Day(s)  # of days IP prior to Palliative referral  1  Clinical Assessment  Palliative Performance Scale Score  40%  Pain Max last 24 hours  Not able to report  Pain Min Last 24 hours  Not able to report  Dyspnea Max Last 24 Hours  Not able to report  Dyspnea Min Last 24 hours  Not able to report  Nausea Max Last 24 Hours  Not able to report  Nausea Min Last 24 Hours  Not able to report  Anxiety Max Last 24 Hours  Not able to report  Anxiety Min Last 24 Hours  Not able to report  Other Max Last 24 Hours  Not able to report  Psychosocial & Spiritual Assessment  Palliative Care Outcomes  Patient/Family meeting held?  Yes  Who was at the meeting?  dtr and SIL      Time In: 1500 Time Out: 1600 Time Total: 60 min Greater than 50%  of this time was spent counseling and coordinating care related to the above assessment and plan. Staffed with Dr. Wyline Copas  Signed by: Dory Horn, NP   Please contact Palliative Medicine Team phone at [phone] for questions and concerns.  For individual provider: See Shea Evans

## 2017-09-18 NOTE — NC FL2 (Signed)
[place] MEDICAID FL2 LEVEL OF CARE SCREENING TOOL     IDENTIFICATION  Patient Name: Marisa Smith Birthdate: [birth year] Sex: female Admission Date (Current Location): 09/16/2017  [facility] [place]   Facility and Address:  [facility] [place], [address], [place], [place] [address]      Provider Number: [id number]  Attending Physician Name and Address:  Jerald Kiefhiu, Stephen K, MD  Relative Name and Phone Number:       Current Level of Care: Hospital Recommended Level of Care: Skilled Nursing Facility Prior Approval Number:    Date Approved/Denied:   PASRR Number: [id number]  Discharge Plan: SNF    Current Diagnoses: Patient Active Problem List   Diagnosis Date Noted  . CVA (cerebral vascular accident) ([facility]) 09/16/2017  . AKI (acute kidney injury) ([facility]) 09/16/2017  . Closed compression fracture of L1 lumbar vertebra, initial encounter ([facility]) 07/27/2017  . Hydronephrosis of left kidney 07/27/2017  . A-fib ([facility]) 07/27/2017  . Mass of left kidney 07/27/2017  . Wrist fracture 02/29/2016  . Distal radius fracture, right 02/29/2016  . Proximal left humerus fracture 02/22/2013  . Radius fracture 02/16/2013  . Bradycardia 12/06/2011  . Hyponatremia 11/24/2011  . Hyperglycemia 11/24/2011  . Autoimmune hepatitis ([facility])   . Headache(784.0) 02/15/2010  . TREMOR 02/02/2010  . ANXIETY 12/11/2009  . DEPRESSION, MAJOR, MODERATE 10/19/2009  . Hypothyroidism 04/26/2009  . Unspecified vitamin D deficiency 04/26/2009  . HYPERTENSION, BENIGN ESSENTIAL 04/26/2009  . OSTEOPOROSIS 04/26/2009    Orientation RESPIRATION BLADDER Height & Weight     Self  Normal Incontinent Weight: 111 lb 12.4 oz (50.7 kg) Height:  5\' 7"  (170.2 cm)  BEHAVIORAL SYMPTOMS/MOOD NEUROLOGICAL BOWEL NUTRITION STATUS      Incontinent Diet(see DC summary)  AMBULATORY STATUS COMMUNICATION OF NEEDS Skin   Extensive Assist Verbally Normal                        Personal Care Assistance Level of Assistance  Bathing, Feeding, Dressing Bathing Assistance: Maximum assistance Feeding assistance: Maximum assistance Dressing Assistance: Maximum assistance     Functional Limitations Info  Sight, Hearing, Speech Sight Info: Impaired Hearing Info: Adequate Speech Info: Impaired    SPECIAL CARE FACTORS FREQUENCY  PT (By licensed PT), OT (By licensed OT), Speech therapy     PT Frequency: 5x/wk OT Frequency: 5x/wk     Speech Therapy Frequency: 5x/wk      Contractures Contractures Info: Not present    Additional Factors Info  Code Status, Allergies Code Status Info: DNR Allergies Info: Amlodipine Besylate, Azatadine, Azathioprine, Penicillins, Sertraline Hcl, Sulfonamide Derivatives           Current Medications (09/18/2017):  This is the current hospital active medication list Current Facility-Administered Medications  Medication Dose Route Frequency Provider Last Rate Last Dose  .  stroke: mapping our early stages of recovery book   Does not apply Once Jonah BlueYates, Jennifer, MD      . 0.9 %  sodium chloride infusion   Intravenous Continuous Jonah BlueYates, Jennifer, MD 50 mL/hr at 09/18/17 0534 1,000 mL at 09/18/17 0534  . acetaminophen (TYLENOL) tablet 650 mg  650 mg Oral Q4H PRN Jonah BlueYates, Jennifer, MD       Or  . acetaminophen (TYLENOL) solution 650 mg  650 mg Per Tube Q4H PRN Jonah BlueYates, Jennifer, MD       Or  . acetaminophen (TYLENOL) suppository 650 mg  650 mg Rectal Q4H PRN Jonah BlueYates, Jennifer, MD      .  aspirin suppository 300 mg  300 mg Rectal Daily Jonah BlueYates, Jennifer, MD   300 mg at 09/17/17 1030   Or  . aspirin tablet 325 mg  325 mg Oral Daily Jonah BlueYates, Jennifer, MD      . atorvastatin (LIPITOR) tablet 40 mg  40 mg Oral q1800 Jonah BlueYates, Jennifer, MD      . diltiazem (CARDIZEM) 100 mg in dextrose 5% 100mL (1 mg/mL) infusion  5-15 mg/hr Intravenous Titrated Jonah BlueYates, Jennifer, MD 7.5 mL/hr at 09/17/17 2341 7.5 mg/hr at 09/17/17 2341  . heparin ADULT infusion 100  units/mL (25000 units/25050mL sodium chloride 0.45%)  850 Units/hr Intravenous Continuous Pierce, Dwayne A, RPH 8.5 mL/hr at 09/18/17 0348 850 Units/hr at 09/18/17 0348  . ketorolac (TORADOL) 15 MG/ML injection 15 mg  15 mg Intravenous Q6H Jerald Kiefhiu, Stephen K, MD   15 mg at 09/18/17 0535  . LORazepam (ATIVAN) injection 0.5 mg  0.5 mg Intravenous Steva ColderQ12H Yates, Jennifer, MD   0.5 mg at 09/17/17 2343  . polyvinyl alcohol (LIQUIFILM TEARS) 1.4 % ophthalmic solution 1 drop  1 drop Both Eyes TID Scarlett Prestogan, Theresa D, [facility]   1 drop at 09/17/17 2343     Discharge Medications: Please see discharge summary for a list of discharge medications.  Relevant Imaging Results:  Relevant Lab Results:   Additional Information SS#: [id number]  Baldemar LenisElizabeth M Mister Krahenbuhl, LCSW

## 2017-09-18 NOTE — Progress Notes (Signed)
  Speech Language Pathology Treatment: Dysphagia  Patient Details Name: Marisa Smith MRN: [id number] DOB: [birth year] Today's Date: 09/18/2017 Time: [id number] SLP Time Calculation (min) (ACUTE ONLY): 20 min  Assessment / Plan / Recommendation Clinical Impression  Pt today is much more alert but continues with dried green tinged secretions in posterior oral cavity.  She allowed SLP to provide oral care using toothette and oral suction.  Pt today able to state name and correctly identify her residence with verbal choice of two.  Mild dysarthria continues but much improved.    Pt accepted intake of water, ice and applesauce after care.  Anterior spillage of liquids from right labial region noted without pt awareness.  Multiple swallows x5 with puree observed concerning for potential pharyngeal residuals and/or oral deficits!  Pt with subtle throat clearing with intake that may be indicative of airway penetration.    Given neuro diagnosis and symptoms of dysphagia, MBS indicated to allow instrumental assessment of swallow to determine least restrictive diet.  Suspect pt may be able to manage a modified diet.  Advised RN that pt is scheduled at 1130 am for MBS.  Recommend ice chips only pending testing.  Thanks!   Pt was   HPI HPI: 84 yo female who resides in memory unit per report of RN admitted with right facial droop and aphasia.  Pt found to have acute cva - left frontal ischemia and old cerebellar infarcts.        SLP Plan  MBS(today at 1130)       Recommendations  Diet recommendations: NPO(ice chips) Medication Administration: Via alternative means                Oral Care Recommendations: Oral care QID Follow up Recommendations: Skilled Nursing facility SLP Visit Diagnosis: Dysphagia, oral phase (R13.11)(suspect pharyngeal phase dysphagia also) Plan: MBS(today at 1130)       GO                Marisa Smith, Marisa Smith 09/18/2017, 9:07 AM  Marisa Burnetamara Flora Ratz, MS [facility]  SLP [phone]

## 2017-09-18 NOTE — Consult Note (Addendum)
Modified Barium Swallow Progress Note  Patient Details  Name: Marisa Smith MRN: [id number] Date of Birth: 1924-11-11  Today's Date: 09/18/2017  Modified Barium Swallow completed.  Full report located under Chart Review in the Imaging Section.  Brief recommendations include the following:  Clinical Impression Pt presents with moderate oral, mild pharyngeal dysphagia, characterized as follows:   ORALLY, pt exhibits significant xerostomia. Poor labial seal is apparent, with right anterior leakage of most po trials. Pt has difficulty with bolus formation and posterior propulsion, and exhibits diffuse residue throughout the oral cavity after the swallow, which is largely piecemeal. Pt was unable to chew mechanical soft solids, so this consistency was removed from the oral cavity. Following the study, pt was noted to have continued right anterior leakage due to diffuse residue.   PHARYNGEALLY, pt exhibits slightly delayed swallow reflex with reduced epiglottic inversion intermittently. Diffuse tongue base and vallecular residue noted after the swallow, which clears with dry swallows. There was no penetration or aspiration of any consistency, however, pt is at increased risk due to oropharyngeal residue.   Thorough oral care is strongly encouraged, both before and after meals to decrease bacterial load, and minimize aspiration risk. Pt is at high risk for inadequate intake. Crush meds in puree. Safe swallow precautions sent back to pt room with transport. SLP will follow for diet tolerance assessment and education.    Swallow Evaluation Recommendations  SLP Diet Recommendations: Dysphagia 1 (Puree) solids;Thin liquid   Liquid Administration via: Straw   Medication Administration: Crushed with puree   Supervision: Full assist for feeding;Staff to assist with self feeding;Full supervision/cueing for compensatory strategies   Compensations: Minimize environmental distractions;Slow rate;Small  sips/bites;Lingual sweep for clearance of pocketing;Follow solids with liquid   Postural Changes: Remain semi-upright after after feeds/meals (Comment);Seated upright at 90 degrees   Oral Care Recommendations: Oral care before and after PO   Other Recommendations: Have oral suction available   Marisa B. Murvin NatalBueche, [facility]P, CCC-SLP Speech Language Pathologist [phone]  Marisa Smith, Marisa Smith 09/18/2017,12:43 PM

## 2017-09-18 NOTE — Progress Notes (Signed)
CSW following for discharge plan. [facility] today; still have not received authorization information. Unsure if patient can return to previous living environment or will have to wait insurance authorization; CSW will need to check with SNF.   CSW to follow.  Blenda NicelyElizabeth Shaine Mount, [place] Clinical Social Worker [phone]

## 2017-09-18 NOTE — Progress Notes (Signed)
PROGRESS NOTE    Marisa Smith  FYT:[id number] DOB: October 31, 1924 DOA: 09/16/2017 PCP: Lajean Manes, MD    Brief Narrative:  84 y.o. female with medical history significant of hypothyroidism; HTN; afib not on AC; and autoimmune hepatitis presenting with "stroke symptoms."   She was admitted 6/24-26 for an acute compression fracture at L1 complicated by hyponatremia.   She has marked aphasia and is unable to effectively communicate, but she does appear to be A&O x 2.  Further history is not able to be obtained at this time.  HPI per Dr. Gilford Raid: Pt presents to the ED today as a stroke alert. The pt has a hx of afib not on anticoagulation. She was lsn at 1100 at her living facility. Nurse practitioner found her with right facial droop and aphasia around noon. Stroke team met pt at the bridge and she went immediately to CT. Pt has a right sided facial droop and is in afib with rvr. The pt did have a mri which showed cva. Pt d/w [facility] hospitalists for admission   ED Course:  H/o afib, not on AC.  MRI with multifocal acute strokes - R facial droop, dysarthria.  Neurology has seen - can be started on Heparin if appropriate without bolus.  Had RVR, given Dilt.  Possible aspiration PNA, started on antibiotics.  Low K+- repleted  Assessment & Plan:   Principal Problem:   CVA (cerebral vascular accident) ([place]) Active Problems:   Hypothyroidism   HYPERTENSION, BENIGN ESSENTIAL   A-fib ([place])   AKI (acute kidney injury) ([place])  CVA -Patient with h/o afib not on AC, presented with acute R facial droop and aphasia -MRI/CTA confirms multiple foci of acute ischemia within the left hemisphere with the largest lesions located in the left frontal lobe.  Also with punctate subacute right cerebellar infarct and multiple old cerebellar infarcts. -Continued on tele -Echo performed, pending results -Therapy recs for SNF -SLP initially with recommendations for NPO with alternative means of  nutrition/meds -Overnight, pt has passed swallow and cleared for diet -SW consult for placement -Will transition off heparin gtt to PO anticoagulation. Pharmacy consult for eliquis placed -Neurology has since signed off  HTN -Allowing permissive HTN -Treat BP only if >220/120, and then with goal of 15% reduction -Neurology recommends gradual normalization of BP in the next 5-7 days -Stable at this time  HLD -LDL noted to be 94 -Stable at this time  Afib, previously not on AC -Rate controlled at home on Verapamil -Patient currently on cardizem gtt, relatively rate controlled -Passed swallow. Will transition off cardizem gtt to PO cardizem -Transition off heparin to NOAC per Neurology  AKI -Baseline creatinine 0.6, currently 1.08 -Cr has improved with IVF hydration -Stable at present  Hypothyroidism -TSH elevated, however free t4 within normal range -Cont with replacement as tolerated  DVT prophylaxis: Heparin gtt Code Status: DNR Family Communication: Pt in room, family not at bedside, updated daughter over phone 8/15 Disposition Plan: Uncertain at this time  Consultants:   Neurology  Palliative Care  Procedures:     Antimicrobials: Anti-infectives (From admission, onward)   Start     Dose/Rate Route Frequency Ordered Stop   09/17/17 1000  vancomycin (VANCOCIN) 500 mg in sodium chloride 0.9 % 100 mL IVPB  Status:  Discontinued     500 mg 100 mL/hr over 60 Minutes Intravenous Every 24 hours 09/16/17 1433 09/16/17 1541   09/16/17 1500  ceFEPIme (MAXIPIME) 2 g in sodium chloride 0.9 % 100 mL IVPB  2 g 200 mL/hr over 30 Minutes Intravenous  Once 09/16/17 1428 09/16/17 1601   09/16/17 1445  vancomycin (VANCOCIN) IVPB 1000 mg/200 mL premix     1,000 mg 200 mL/hr over 60 Minutes Intravenous  Once 09/16/17 1433 09/16/17 1610      Subjective: Reports feeling better today  Objective: Vitals:   09/17/17 2330 09/18/17 0743 09/18/17 1250 09/18/17 1622  BP:  (!) 145/77 (!) 147/81 (!) 149/85 (!) 136/93  Pulse: 92 89 (!) 101 (!) 102  Resp: (!) 27 20 (!) 25 (!) 27  Temp:  97.8 F (36.6 C) 98.8 F (37.1 C) 98.4 F (36.9 C)  TempSrc: Oral Axillary Axillary Axillary  SpO2: 94% 96% 92% 93%  Weight:      Height:        Intake/Output Summary (Last 24 hours) at 09/18/2017 1731 Last data filed at 09/18/2017 1400 Gross per 24 hour  Intake 299.02 ml  Output -  Net 299.02 ml   Filed Weights   09/16/17 1200  Weight: 50.7 kg    Examination: General exam: Awake, laying in bed, in nad Respiratory system: Normal respiratory effort, no wheezing Cardiovascular system: regular rate, s1, s2 Gastrointestinal system: Soft, nondistended, positive BS Central nervous system: CN2-12 grossly intact, strength intact Extremities: Perfused, no clubbing Skin: Normal skin turgor, no notable skin lesions seen Psychiatry: Mood normal // no visual hallucinations   Data Reviewed: I have personally reviewed following labs and imaging studies  CBC: Recent Labs  Lab 09/16/17 1233 09/16/17 1240 09/18/17 0412  WBC 9.2  --  7.7  NEUTROABS 6.6  --   --   HGB 13.3 13.9 12.2  HCT 39.6 41.0 37.4  MCV 99.2  --  100.8*  PLT 264  --  734   Basic Metabolic Panel: Recent Labs  Lab 09/16/17 1233 09/16/17 1240 09/16/17 1500 09/17/17 0218 09/18/17 0412  NA 134* 134*  --  132* 133*  K 2.7* 2.7*  --  3.3* 3.5  CL 94* 94*  --  99 101  CO2 26  --   --  22 18*  GLUCOSE 102* 98  --  91 69*  BUN 19 21  --  16 19  CREATININE 1.08* 1.00  --  0.91 0.89  CALCIUM 9.1  --   --  8.3* 8.3*  MG  --   --  2.0  --   --   PHOS  --   --  2.8  --   --    GFR: Estimated Creatinine Clearance: 33 mL/min (by C-G formula based on SCr of 0.89 mg/dL). Liver Function Tests: Recent Labs  Lab 09/16/17 1233  AST 19  ALT 10  ALKPHOS 126  BILITOT 1.3*  PROT 7.0  ALBUMIN 3.2*   No results for input(s): LIPASE, AMYLASE in the last 168 hours. No results for input(s): AMMONIA in the  last 168 hours. Coagulation Profile: Recent Labs  Lab 09/16/17 1233 09/17/17 0218  INR 1.07 1.15   Cardiac Enzymes: No results for input(s): CKTOTAL, CKMB, CKMBINDEX, TROPONINI in the last 168 hours. BNP (last 3 results) No results for input(s): PROBNP in the last 8760 hours. HbA1C: Recent Labs    09/17/17 0935  HGBA1C 6.0*   CBG: Recent Labs  Lab 09/16/17 1233  GLUCAP 94   Lipid Profile: Recent Labs    09/17/17 0218  CHOL 155  HDL 47  LDLCALC 94  TRIG 71  CHOLHDL 3.3   Thyroid Function Tests: Recent Labs    09/16/17  1500 09/16/17 1851  TSH 5.627*  --   FREET4  --  1.60   Anemia Panel: No results for input(s): VITAMINB12, FOLATE, FERRITIN, TIBC, IRON, RETICCTPCT in the last 72 hours. Sepsis Labs: No results for input(s): PROCALCITON, LATICACIDVEN in the last 168 hours.  No results found for this or any previous visit (from the past 240 hour(s)).   Radiology Studies: Dg Swallowing Func-speech Pathology  Result Date: 09/18/2017 Objective Swallowing Evaluation: Type of Study: MBS-Modified Barium Swallow Study  Patient Details Name: SAMARIE PINDER MRN: [id number] Date of Birth: 05-19-1924 Today's Date: 09/18/2017 Time: SLP Start Time (ACUTE ONLY): 1155 -SLP Stop Time (ACUTE ONLY): 1215 SLP Time Calculation (min) (ACUTE ONLY): 20 min Past Medical History: Past Medical History: Diagnosis Date . ALLERGIC RHINITIS CAUSE UNSPECIFIED  . ANXIETY  . Autoimmune hepatitis ([place])   on MP6, prev pred . DEPRESSION, MAJOR, MODERATE  . Headache(784.0) 02/15/2010 . HYPERTENSION, BENIGN ESSENTIAL  . HYPOTHYROIDISM  . OSTEOPOROSIS   fx L prox hum and L distal radius, both requiring ORIF 02/2013 . Unspecified vitamin D deficiency  Past Surgical History: Past Surgical History: Procedure Laterality Date . CATARACT EXTRACTION  2008  Left eye . CATARACT EXTRACTION  2009  Right eye . HUMERUS IM NAIL Left 02/2013  DR Rosaland Lao . HUMERUS IM NAIL Left 02/24/2013  Procedure: INTRAMEDULLARY (IM) NAIL LEFT  PROXIMAL  HUMERUS;  Surgeon: Nita Sells, MD;  Location: [facility];  Service: Orthopedics;  Laterality: Left; . IR KYPHO LUMBAR INC FX REDUCE BONE BX UNI/BIL CANNULATION INC/IMAGING  07/28/2017 . OPEN REDUCTION INTERNAL FIXATION (ORIF) DISTAL RADIAL FRACTURE Left 02/16/2013  Procedure: OPEN REDUCTION INTERNAL FIXATION (ORIF) DISTAL RADIAL FRACTURE;  Surgeon: Linna Hoff, MD;  Location: [facility];  Service: Orthopedics;  Laterality: Left; . ORIF RADIAL FRACTURE Right 03/01/2016  Procedure: OPEN REDUCTION INTERNAL FIXATION (ORIF) RADIUS FRACTURE;  Surgeon: Roseanne Kaufman, MD;  Location: [facility];  Service: Orthopedics;  Laterality: Right; . TONSILLECTOMY  1945 HPI: 84 yo female who resides in memory unit per report of RN admitted with right facial droop and aphasia.  Pt found to have acute cva - left frontal ischemia and old cerebellar infarcts.   Subjective: Pt seen in radiology for MBS to objectively assess swallow function and safety, and identify least restrictive diet. Assessment / Plan / Recommendation CHL IP CLINICAL IMPRESSIONS 09/18/2017 Clinical Impression Pt presents with moderate oral, mild pharyngeal dysphagia, characterized as follows: ORALLY, pt exhibits significant xerostomia. Poor labial seal is apparent, with right anterior leakage of most po trials. Pt has difficulty with bolus formation and posterior propulsion, and exhibits diffuse residue throughout the oral cavity after the swallow, which is largely piecemeal. Pt was unable to chew mechanical soft solids, so this consistency was removed from the oral cavity. Following the study, pt was noted to have continued right anterior leakage due to diffuse residue. PHARYNGEALLY, pt exhibits slightly delayed swallow reflex with reduced epiglottic inversion intermittently. Diffuse tongue base and vallecular residue noted after the swallow, which clears with dry swallows. There was no penetration or aspiration of any consistency, however, pt is at increased  risk due to oropharyngeal residue. Thorough oral care is strongly encouraged, both before and after meals to decrease bacterial load, and minimize aspiration risk. Crush meds in puree. Safe swallow precautions sent back to pt room with transport. SLP will follow for diet tolerance assessment and education.  SLP Visit Diagnosis Dysphagia, oropharyngeal phase (R13.12) Impact on safety and function Mild aspiration risk;Moderate aspiration risk;Risk for inadequate  nutrition/hydration   CHL IP TREATMENT RECOMMENDATION 09/18/2017 Treatment Recommendations Therapy as outlined in treatment plan below   Prognosis 09/18/2017 Prognosis for Safe Diet Advancement Guarded Barriers to Reach Goals Severity of deficits;Cognitive deficits CHL IP DIET RECOMMENDATION 09/18/2017 SLP Diet Recommendations Dysphagia 1 (Puree) solids;Thin liquid Liquid Administration via Straw Medication Administration Crushed with puree Compensations Minimize environmental distractions;Slow rate;Small sips/bites;Lingual sweep for clearance of pocketing;Follow solids with liquid Postural Changes Position upright at 90 degrees Remain at 45 degrees for 30 minutes after meals    CHL IP OTHER RECOMMENDATIONS 09/18/2017 Oral Care Recommendations Oral care before and after PO Other Recommendations Have oral suction available   CHL IP FOLLOW UP RECOMMENDATIONS 09/18/2017 Follow up Recommendations Skilled Nursing facility;24 hour supervision/assistance   CHL IP FREQUENCY AND DURATION 09/18/2017 Speech Therapy Frequency (ACUTE ONLY) min 2x/week Treatment Duration 1 week;2 weeks      CHL IP ORAL PHASE 09/18/2017 Oral Phase Impaired Oral - Nectar Straw Right anterior bolus loss;Weak lingual manipulation;Right pocketing in lateral sulci;Reduced posterior propulsion;Lingual/palatal residue;Premature spillage;Delayed oral transit;Piecemeal swallowing;Holding of bolus;Decreased bolus cohesion Oral - Thin Straw Right anterior bolus loss;Weak lingual manipulation;Right pocketing  in lateral sulci;Reduced posterior propulsion;Lingual/palatal residue;Premature spillage;Delayed oral transit;Piecemeal swallowing;Holding of bolus;Decreased bolus cohesion Oral - Puree Right pocketing in lateral sulci;Reduced posterior propulsion;Lingual/palatal residue;Premature spillage;Delayed oral transit;Piecemeal swallowing;Holding of bolus;Decreased bolus cohesion Oral - Mech Soft Pt unable to chew cracker, so it was removed from oral cavity.  CHL IP PHARYNGEAL PHASE 09/18/2017 Pharyngeal Phase Impaired Pharyngeal- Nectar Straw Delayed swallow initiation-vallecula;Reduced epiglottic inversion;Reduced tongue base retraction;Pharyngeal residue - valleculae;Pharyngeal residue - posterior pharnyx Pharyngeal- Thin Straw Delayed swallow initiation-vallecula;Reduced epiglottic inversion;Reduced tongue base retraction;Pharyngeal residue - valleculae;Pharyngeal residue - posterior pharnyx Pharyngeal- Puree Delayed swallow initiation-vallecula;Reduced epiglottic inversion;Reduced tongue base retraction;Pharyngeal residue - valleculae;Pharyngeal residue - posterior pharnyx  CHL IP CERVICAL ESOPHAGEAL PHASE 09/18/2017 Cervical Esophageal Phase [facility], CCC-SLP Speech Language Pathologist [phone] Shonna Chock 09/18/2017, 12:39 PM               Scheduled Meds: .  stroke: mapping our early stages of recovery book   Does not apply Once  . aspirin  300 mg Rectal Daily   Or  . aspirin  325 mg Oral Daily  . atorvastatin  40 mg Oral q1800  . ketorolac  15 mg Intravenous Q6H  . LORazepam  0.5 mg Intravenous Q12H  . polyvinyl alcohol  1 drop Both Eyes TID   Continuous Infusions: . sodium chloride 1,000 mL (09/18/17 0534)  . diltiazem (CARDIZEM) infusion 7.5 mg/hr (09/17/17 2341)  . heparin 850 Units/hr (09/18/17 0348)     LOS: 2 days   Marylu Lund, MD [facility] Pager [phone]  If 7PM-7AM, please contact night-coverage [email or web address] Password [facility] 09/18/2017, 5:31 PM

## 2017-09-18 NOTE — Progress Notes (Signed)
ANTICOAGULATION CONSULT NOTE - Follow Up Consult  Pharmacy Consult for Heparin Indication: atrial fibrillation, CVA  Patient Measurements: Height: 5\' 7"  (170.2 cm) Weight: 111 lb 12.4 oz (50.7 kg) IBW/kg (Calculated) : 61.6 Heparin Dosing Weight: 50.7 kg  Vital Signs: Temp: 97.8 F (36.6 C) (08/16 0743) Temp Source: Axillary (08/16 0743) BP: 147/81 (08/16 0743) Pulse Rate: 89 (08/16 0743)  Labs: Recent Labs    09/16/17 1233 09/16/17 1240  09/17/17 0218 09/17/17 1104 09/17/17 1956 09/18/17 0412 09/18/17 0540  HGB 13.3 13.9  --   --   --   --  12.2  --   HCT 39.6 41.0  --   --   --   --  37.4  --   PLT 264  --   --   --   --   --  240  --   APTT 31  --   --   --   --   --   --   --   LABPROT 13.8  --   --  14.6  --   --   --   --   INR 1.07  --   --  1.15  --   --   --   --   HEPARINUNFRC  --   --    < > <0.10* 0.23* 0.34  --  0.49  CREATININE 1.08* 1.00  --  0.91  --   --  0.89  --    < > = values in this interval not displayed.    Estimated Creatinine Clearance: 33 mL/min (by C-G formula based on SCr of 0.89 mg/dL).  Assessment: [age] YOF who presented on 8/14 with new CVA in the setting of hx Afib - not on anticoagulation PTA. Pharmacy consulted to start low-dose Heparin for anticoagulation with Afib/CVA.   Heparin level this morning remains therapeutic (HL 0.49 << 0.34, goal of 0.3-0.7). No CBC today - stable from 8/15 labs. No bleeding noted.   Goal of Therapy:  Heparin level 0.3-0.5 units/ml Monitor platelets by anticoagulation protocol: Yes   Plan:  - Continue Heparin at 850 units/hr (8.5 ml/hr) - Will continue to monitor for any signs/symptoms of bleeding and will follow up with heparin level in the a.m.   Thank you for allowing pharmacy to be a part of this patient's care.  Georgina PillionElizabeth Deren Degrazia, PharmD, BCPS Clinical Pharmacist Pager: [phone] Clinical phone for 09/18/2017 from 7a-3:30p: [phone] If after 3:30p, please call main pharmacy at: [phone] Please  check AMION for all [facility] Pharmacy numbers 09/18/2017 10:34 AM

## 2017-09-18 NOTE — Progress Notes (Signed)
ANTICOAGULATION CONSULT NOTE - Follow Up Consult  Pharmacy Consult for Heparin --> apixaban Indication: atrial fibrillation, CVA  Patient Measurements: Height: 5\' 7"  (170.2 cm) Weight: 111 lb 12.4 oz (50.7 kg) IBW/kg (Calculated) : 61.6   Vital Signs: Temp: 98.4 F (36.9 C) (08/16 1622) Temp Source: Axillary (08/16 1622) BP: 136/93 (08/16 1622) Pulse Rate: 102 (08/16 1622)  Labs: Recent Labs    09/16/17 1233 09/16/17 1240  09/17/17 0218 09/17/17 1104 09/17/17 1956 09/18/17 0412 09/18/17 0540  HGB 13.3 13.9  --   --   --   --  12.2  --   HCT 39.6 41.0  --   --   --   --  37.4  --   PLT 264  --   --   --   --   --  240  --   APTT 31  --   --   --   --   --   --   --   LABPROT 13.8  --   --  14.6  --   --   --   --   INR 1.07  --   --  1.15  --   --   --   --   HEPARINUNFRC  --   --    < > <0.10* 0.23* 0.34  --  0.49  CREATININE 1.08* 1.00  --  0.91  --   --  0.89  --    < > = values in this interval not displayed.    Estimated Creatinine Clearance: 33 mL/min (by C-G formula based on SCr of 0.89 mg/dL).  Assessment: [age] YOF who presented on 8/14 with new CVA in the setting of hx Afib - not on anticoagulation PTA. Pharmacy consulted to transition from heparin to apixaban for Afib/CVA. Reduced dosing appropriate for age > 80 yrs and weight <60 kg. No bleeding noted.  Plan:  -Discontinue heparin drip -Discontinue aspirin per discussion with Dr Rhona Leavenshiu -Begin apixaban 2.5 mg PO bid -Education prior to discharge -Pharmacy signing off - please re-consult if needed   Thank you for allowing pharmacy to be a part of this patient's care.  Loura BackJennifer Center, PharmD, BCPS Clinical Pharmacist Clinical phone for 09/18/2017 until 10p is [phone] Please check AMION for all Pharmacist numbers by unit 09/18/2017 5:56 PM

## 2017-09-18 NOTE — Progress Notes (Addendum)
STROKE TEAM PROGRESS NOTE  CC: no c/o per Marisa Smith today F/u stroke  INTERVAL HISTORY Stroke work up complete. Marisa Smith is unsafe to swallow. Marisa Smith speech has improved, but remains quite difficult to understand and communication is delayed. Marisa Smith is following commands. Marisa Smith tells me Marisa Smith doesn't want a feeding tube. [facility] discuss goals of care. Marisa Smith remains on cardizem and IV heparin.   Vitals:   09/17/17 1815 09/17/17 1930 09/17/17 2330 09/18/17 0743  BP: (!) 134/97 (!) 145/92 (!) 145/77 (!) 147/81  Pulse:  (!) 46 92 89  Resp:  (!) 42 (!) 27 20  Temp:  97.6 F (36.4 C) 98 F (36.7 C) 97.8 F (36.6 C)  TempSrc:  Oral Oral Axillary  SpO2:  93% 94% 96%  Weight:      Height:        CBC:  Recent Labs  Lab 09/16/17 1233 09/16/17 1240 09/18/17 0412  WBC 9.2  --  7.7  NEUTROABS 6.6  --   --   HGB 13.3 13.9 12.2  HCT 39.6 41.0 37.4  MCV 99.2  --  100.8*  PLT 264  --  240    Basic Metabolic Panel:  Recent Labs  Lab 09/16/17 1500 09/17/17 0218 09/18/17 0412  NA  --  132* 133*  K  --  3.3* 3.5  CL  --  99 101  CO2  --  22 18*  GLUCOSE  --  91 69*  BUN  --  16 19  CREATININE  --  0.91 0.89  CALCIUM  --  8.3* 8.3*  MG 2.0  --   --   PHOS 2.8  --   --    Lipid Panel:     Component Value Date/Time   CHOL 155 09/17/2017 0218   TRIG 71 09/17/2017 0218   HDL 47 09/17/2017 0218   CHOLHDL 3.3 09/17/2017 0218   VLDL 14 09/17/2017 0218   LDLCALC 94 09/17/2017 0218   HgbA1c:  Lab Results  Component Value Date   HGBA1C 6.0 (H) 09/17/2017   Urine Drug Screen:     Component Value Date/Time   LABOPIA NONE DETECTED 11/24/2011 0553   COCAINSCRNUR NONE DETECTED 11/24/2011 0553   LABBENZ NONE DETECTED 11/24/2011 0553   AMPHETMU NONE DETECTED 11/24/2011 0553   THCU NONE DETECTED 11/24/2011 0553   LABBARB NONE DETECTED 11/24/2011 0553    Alcohol Level     Component Value Date/Time   ETH <10 09/16/2017 1233    IMAGING Ct Head Code Stroke Wo  Contrast 09/16/2017 1. Small age-indeterminate infarct in the left frontal cortex. ASPECTS is 9. 2. Small age-indeterminate right superior cerebellar infarct. 3. Chronic small vessel ischemia.   Ct Angio Head W Or Wo Contrast Ct Angio Neck W Or Wo Contrast 09/16/2017 1. No large vessel occlusion. 2. Overall mild for age atherosclerosis without flow limiting stenosis in the major vessels. 3. Focal atherosclerotic irregularity of a left M3 branch of note given the preceding head CT findings. 4. There are layering pleural effusions. Small focus of airspace disease in the right upper lobe that is indistinct and likely inflammatory.   Mr Brain Wo Contrast 09/16/2017 1. Multiple foci of acute ischemia within the left hemisphere with the largest lesions located in the left frontal lobe. No hemorrhage or mass effect. 2. Punctate subacute right cerebellar infarct suspected on the basis of DWI hyperintensity without corresponding decrease of apparent diffusion coefficient. 3. Multiple old cerebellar infarcts and advanced  chronic ischemic microangiopathy.   PHYSICAL EXAM GENERAL: Awake, alert in NAD HEENT: - Normocephalic and atraumatic LUNGS - Clear to auscultation bilaterally  CV -S1-S2  irregular irregular Ext: warm, well perfused, intact peripheral pulses, no edema  NEURO:  Mental Status: Awake, alert, attends. There is pausity of speech, but Marisa Smith is slowly able to tell me Marisa Smith name, DOB, age, month and year correctly. Follows simple commands with considerable psychomotor delay. Speech is sparse, limited to one word answers that are dysarthric.  Cranial Nerves: PERRL EOMI, blinks to threat bilaterally, right lower facial weakness  Motor: Antigravity without drift in both upper extremities. Equal purposeful movement BLE without obvious weakness. No lateralizing findings. Tone: is normal and bulk is normal Sensation- Intact to light touch and cold bilaterally Coordination: Unable to perform Gait-  deferred   ASSESSMENT/PLAN Marisa Smith is a 84 y.o. female with history of AF not on AC, HTN, hypothyroidism, anxiety, depression and LBP d/t recent L1 compression fx s/p kyphoplasty presenting with right facial droop, right-sided weakness, aphasia.   Stroke:  Scattered L MCA and puctate R cerebellar infarcts embolic secondary to known atrial fibrillation source not on anticoagulation  Code Stroke CT head age indeterminate L frontal cortex infarct. Age indeterminate R superior cerebellar infarct. Small vessel disease. ASPECTS 9.    CTA head & neck no LVA. Atherosclerosis overall, L M3. Layering pleural effusions. RUL airspace dz   MRI  Scattered L MCA infarcts. Punctate subacute R cerebellar infarct. Mult old cerebellar infarcts. Small vessel disease.   2D Echo  No thrombus. EF 55%  LDL 94  HgbA1c added to labs this am  IV heparin for VTE prophylaxis  NPO. SLP assessed and following Diet Order            Diet NPO time specified  Diet effective now              aspirin 81 mg daily prior to admission, now on heparin IV. Recommend change to eliquis 2.5 bid once able to swallow or permanent feeding option in place, unless goals of care change to comfort and they choose to not treat  Therapy recommendations:  SNF. Marisa Smith and OT evals pending. Ok to be OOB from stroke standpoint. Marisa Smith states today that Marisa Smith doesn't want a feeding tube, which could change dispo to hospice or SNF w/palliative care following.  Disposition:  pending   Atrial Fibrillation w/ RVR  Home anticoagulation:  none   Now on IV heparin and IV cardizem  CHA2DS2-VASc Score = at least 6, ?2 oral anticoagulation recommended  Age in Years:  ?12   +2    Sex:  Female   Female   +1    Hypertension History:  yes   +1     Diabetes Mellitus:  0 Congestive Heart Failure History:  0  Vascular Disease History:  0     Stroke/TIA/Thromboembolism History:  yes   +2 . Recommend Eliquis (apixaban) daily at discharge for  secondary stroke prevention   Hypertension  Stable . Permissive hypertension (OK if < 220/120) but gradually normalize in 5-7 days . Long-term BP goal normotensive  Hyperlipidemia  Home meds:  No statin  Now on lipitor 40  LDL 94, goal < 70  Continue statin at discharge  Other Stroke Risk Factors  Advanced age  Hx stroke/TIA  11/2011 - Acute onset dizziness. MRI neg. TIA vs acute dehydration while taking a diuretic.   Family hx stroke (mother, sister)  Other Active Problems  Possible aspiration PNA, on abx  AKI Cr 1.08  Hypothyroidism  Hypokalemia 3.3  Hospital day # 3 RECs/PLAN: Above stroke recommendations Secondary stroke prevention: transition to eliquis + statin once route available, unless Marisa Smith transitions to comfort feeding pending GOC Unable to do stroke education at this time d/t limited communication Palliative Care consulted for further GOC discussion with family Out Marisa Smith follow up placed in depart with [facility]. We will sign off. Call back if needed.   Desiree Metzger-Cihelka, MSN, ARNP-C Advanced Practice Stroke Nurse [facility] See Amion for Schedule & Pager information 09/18/2017 10:43 AM   ATTENDING NOTE: I reviewed above note and agree with the assessment and plan. I have made any additions or clarifications directly to the above note. Marisa Smith was seen and examined.   84 year old female with history of anxiety, depression, autoimmune hepatitis, hypertension, hypothyroidism, atrial fibrillation not on anticoagulation, nursing home resident and recent L1 compression fracture status post kyphoplasty admitted for right facial droop and aphasia.  CT showed right cerebellum infarct.  CTA head and neck unremarkable except left M3 atherosclerosis.  MRI showed right cerebellum and left MCA scattered infarcts.  EF 55 to 60%.  LDL 94 and A1c 6.0.  In ER, EKG showed A. fib RVR.  Marisa Smith was treated with aspirin PR, Lipitor 40 and put on heparin IV with  Cardizem IV.  Overnight, Marisa Smith neuro exam improved. Marisa Smith is able to answer questions, orientated to place, age, but not orientated to time and people. Able to repeat simple sentence and name 1/3. Still has paucity of speech. Generalized weakness but symmetrical bilaterally. Marisa Smith passed swallow and currently on puree diet and thin liquid with no straw.   Still agree with palliative care involvement for GOC discussion. Based on palliative care discussion, may consider to transition from heparin IV to DOACs, and cardizem IV can transition to po. Continue Marisa Smith/OT/speech, stroke risk factor modification.  Marisa Smith/OT recommend SNF.  Neurology will sign off. Please call with questions. Mar[facility] Smith [facility] weeks. Thanks for the consult.  Marvel PlanJindong Cylas Falzone, MD PhD Stroke Neurology 09/18/2017 4:03 PM  To contact Stroke Continuity provider, please refer to [email or web address]. After hours, contact General Neurology

## 2017-09-19 DIAGNOSIS — I6300 Cerebral infarction due to thrombosis of unspecified precerebral artery: Secondary | ICD-10-CM

## 2017-09-19 LAB — BASIC METABOLIC PANEL
ANION GAP: 7 (ref 5–15)
BUN: 19 mg/dL (ref 8–23)
CHLORIDE: 106 mmol/L (ref 98–111)
CO2: 21 mmol/L — ABNORMAL LOW (ref 22–32)
Calcium: 8.1 mg/dL — ABNORMAL LOW (ref 8.9–10.3)
Creatinine, Ser: 0.78 mg/dL (ref 0.44–1.00)
GFR calc Af Amer: >60 mL/min (ref >60)
GLUCOSE: 103 mg/dL — AB (ref 70–99)
POTASSIUM: 3.1 mmol/L — AB (ref 3.5–5.1)
SODIUM: 134 mmol/L — AB (ref 135–145)

## 2017-09-19 MED ORDER — POTASSIUM CHLORIDE CRYS ER 20 MEQ PO TBCR
40.0000 meq | EXTENDED_RELEASE_TABLET | Freq: Once | ORAL | Status: AC
Start: 2017-09-19 — End: 2017-09-19
  Administered 2017-09-19: 40 meq via ORAL
  Filled 2017-09-19: qty 2

## 2017-09-19 NOTE — Progress Notes (Signed)
PROGRESS NOTE    Marisa Smith  TKZ:[id number] DOB: [birth year] DOA: 09/16/2017 PCP: Lajean Manes, MD    Brief Narrative:  84 y.o. female with medical history significant of hypothyroidism; HTN; afib not on AC; and autoimmune hepatitis presenting with "stroke symptoms."   She was admitted 6/24-26 for an acute compression fracture at L1 complicated by hyponatremia.   She has marked aphasia and is unable to effectively communicate, but she does appear to be A&O x 2.  Further history is not able to be obtained at this time.  HPI per Dr. Gilford Raid: Pt presents to the ED today as a stroke alert. The pt has a hx of afib not on anticoagulation. She was lsn at 1100 at her living facility. Nurse practitioner found her with right facial droop and aphasia around noon. Stroke team met pt at the bridge and she went immediately to CT. Pt has a right sided facial droop and is in afib with rvr. The pt did have a mri which showed cva. Pt d/w [facility] hospitalists for admission   ED Course:  H/o afib, not on AC.  MRI with multifocal acute strokes - R facial droop, dysarthria.  Neurology has seen - can be started on Heparin if appropriate without bolus.  Had RVR, given Dilt.  Possible aspiration PNA, started on antibiotics.  Low K+- repleted  Assessment & Plan:   Principal Problem:   CVA (cerebral vascular accident) ([place]) Active Problems:   Hypothyroidism   HYPERTENSION, BENIGN ESSENTIAL   A-fib ([place])   AKI (acute kidney injury) ([place])   Aspiration pneumonia due to vomit ([place])   Goals of care, counseling/discussion   Generalized anxiety disorder   Palliative care by specialist  CVA -Patient with h/o afib not on AC, presented with acute R facial droop and aphasia -MRI/CTA confirms multiple foci of acute ischemia within the left hemisphere with the largest lesions located in the left frontal lobe.  Also with punctate subacute right cerebellar infarct and multiple old cerebellar infarcts. -Continued on  tele -Echo performed, pending results -Therapy recs for SNF -SLP initially with recommendations for NPO with alternative means of nutrition/meds. Patient has since passed swallow and cleared for diet -SW consult for placement -Have transitioned off heparin gtt and on eliquis -Neurology has since signed off  HTN -Allowing permissive HTN -Treat BP only if >220/120, and then with goal of 15% reduction -Neurology recommends gradual normalization of BP in the next 5-7 days -Hypertensive, although stable at this time  HLD -LDL noted to be 94 -Presently stable  Afib, previously not on AC -Rate controlled at home on Verapamil -Patient currently on cardizem gtt, relatively rate controlled -Passed swallow. Currently on PO cardizem -now on eliquis  AKI -Baseline creatinine 0.6, currently 1.08 -Cr has improved with IVF hydration -Remains stable at present  Hypothyroidism -TSH elevated, however free t4 within normal range -Cont with replacement as tolerated  DVT prophylaxis: Heparin gtt Code Status: DNR Family Communication: Pt in room, family not at bedside, updated daughter over phone 8/15 Disposition Plan: Uncertain at this time  Consultants:   Neurology  Palliative Care  Procedures:     Antimicrobials: Anti-infectives (From admission, onward)   Start     Dose/Rate Route Frequency Ordered Stop   09/17/17 1000  vancomycin (VANCOCIN) 500 mg in sodium chloride 0.9 % 100 mL IVPB  Status:  Discontinued     500 mg 100 mL/hr over 60 Minutes Intravenous Every 24 hours 09/16/17 1433 09/16/17 1541   09/16/17 1500  ceFEPIme (MAXIPIME) 2 g in sodium chloride 0.9 % 100 mL IVPB     2 g 200 mL/hr over 30 Minutes Intravenous  Once 09/16/17 1428 09/16/17 1601   09/16/17 1445  vancomycin (VANCOCIN) IVPB 1000 mg/200 mL premix     1,000 mg 200 mL/hr over 60 Minutes Intravenous  Once 09/16/17 1433 09/16/17 1610      Subjective: Without complaints  Objective: Vitals:    09/19/17 0323 09/19/17 0724 09/19/17 1158 09/19/17 1621  BP:   (!) 161/118 (!) 156/112  Pulse:   (!) 125 (!) 58  Resp:  (!) 21  (!) 31  Temp: 97.7 F (36.5 C) 98.7 F (37.1 C) 98.6 F (37 C) 99.9 F (37.7 C)  TempSrc: Oral Oral Oral Axillary  SpO2:      Weight:      Height:       No intake or output data in the 24 hours ending 09/19/17 1807 Filed Weights   09/16/17 1200  Weight: 50.7 kg    Examination: General exam: Conversant, in no acute distress Respiratory system: normal chest rise, clear, no audible wheezing Cardiovascular system: regular rhythm, s1-s2 Gastrointestinal system: Nondistended, nontender, pos BS Central nervous system: No seizures, no tremors Extremities: No cyanosis, no joint deformities Skin: No rashes, no pallor Psychiatry: Affect normal // no auditory hallucinations   Data Reviewed: I have personally reviewed following labs and imaging studies  CBC: Recent Labs  Lab 09/16/17 1233 09/16/17 1240 09/18/17 0412  WBC 9.2  --  7.7  NEUTROABS 6.6  --   --   HGB 13.3 13.9 12.2  HCT 39.6 41.0 37.4  MCV 99.2  --  100.8*  PLT 264  --  400   Basic Metabolic Panel: Recent Labs  Lab 09/16/17 1233 09/16/17 1240 09/16/17 1500 09/17/17 0218 09/18/17 0412 09/19/17 0451  NA 134* 134*  --  132* 133* 134*  K 2.7* 2.7*  --  3.3* 3.5 3.1*  CL 94* 94*  --  99 101 106  CO2 26  --   --  22 18* 21*  GLUCOSE 102* 98  --  91 69* 103*  BUN 19 21  --  '16 19 19  '$ CREATININE 1.08* 1.00  --  0.91 0.89 0.78  CALCIUM 9.1  --   --  8.3* 8.3* 8.1*  MG  --   --  2.0  --   --   --   PHOS  --   --  2.8  --   --   --    GFR: Estimated Creatinine Clearance: 36.7 mL/min (by C-G formula based on SCr of 0.78 mg/dL). Liver Function Tests: Recent Labs  Lab 09/16/17 1233  AST 19  ALT 10  ALKPHOS 126  BILITOT 1.3*  PROT 7.0  ALBUMIN 3.2*   No results for input(s): LIPASE, AMYLASE in the last 168 hours. No results for input(s): AMMONIA in the last 168  hours. Coagulation Profile: Recent Labs  Lab 09/16/17 1233 09/17/17 0218  INR 1.07 1.15   Cardiac Enzymes: No results for input(s): CKTOTAL, CKMB, CKMBINDEX, TROPONINI in the last 168 hours. BNP (last 3 results) No results for input(s): PROBNP in the last 8760 hours. HbA1C: Recent Labs    09/17/17 0935  HGBA1C 6.0*   CBG: Recent Labs  Lab 09/16/17 1233  GLUCAP 94   Lipid Profile: Recent Labs    09/17/17 0218  CHOL 155  HDL 47  LDLCALC 94  TRIG 71  CHOLHDL 3.3   Thyroid Function  Tests: Recent Labs    09/16/17 1851  FREET4 1.60   Anemia Panel: No results for input(s): VITAMINB12, FOLATE, FERRITIN, TIBC, IRON, RETICCTPCT in the last 72 hours. Sepsis Labs: No results for input(s): PROCALCITON, LATICACIDVEN in the last 168 hours.  No results found for this or any previous visit (from the past 240 hour(s)).   Radiology Studies: Dg Swallowing Func-speech Pathology  Result Date: 09/18/2017 Objective Swallowing Evaluation: Type of Study: MBS-Modified Barium Swallow Study  Patient Details Name: Marisa Smith MRN: [id number] Date of Birth: Nov 29, 1924 Today's Date: 09/18/2017 Time: SLP Start Time (ACUTE ONLY): 1155 -SLP Stop Time (ACUTE ONLY): 1215 SLP Time Calculation (min) (ACUTE ONLY): 20 min Past Medical History: Past Medical History: Diagnosis Date . ALLERGIC RHINITIS CAUSE UNSPECIFIED  . ANXIETY  . Autoimmune hepatitis ([place])   on MP6, prev pred . DEPRESSION, MAJOR, MODERATE  . Headache(784.0) 02/15/2010 . HYPERTENSION, BENIGN ESSENTIAL  . HYPOTHYROIDISM  . OSTEOPOROSIS   fx L prox hum and L distal radius, both requiring ORIF 02/2013 . Unspecified vitamin D deficiency  Past Surgical History: Past Surgical History: Procedure Laterality Date . CATARACT EXTRACTION  2008  Left eye . CATARACT EXTRACTION  2009  Right eye . HUMERUS IM NAIL Left 02/2013  DR Rosaland Lao . HUMERUS IM NAIL Left 02/24/2013  Procedure: INTRAMEDULLARY (IM) NAIL LEFT PROXIMAL  HUMERUS;  Surgeon: Nita Sells, MD;  Location: [facility];  Service: Orthopedics;  Laterality: Left; . IR KYPHO LUMBAR INC FX REDUCE BONE BX UNI/BIL CANNULATION INC/IMAGING  07/28/2017 . OPEN REDUCTION INTERNAL FIXATION (ORIF) DISTAL RADIAL FRACTURE Left 02/16/2013  Procedure: OPEN REDUCTION INTERNAL FIXATION (ORIF) DISTAL RADIAL FRACTURE;  Surgeon: Linna Hoff, MD;  Location: [facility];  Service: Orthopedics;  Laterality: Left; . ORIF RADIAL FRACTURE Right 03/01/2016  Procedure: OPEN REDUCTION INTERNAL FIXATION (ORIF) RADIUS FRACTURE;  Surgeon: Roseanne Kaufman, MD;  Location: [facility];  Service: Orthopedics;  Laterality: Right; . TONSILLECTOMY  1945 HPI: 84 yo female who resides in memory unit per report of RN admitted with right facial droop and aphasia.  Pt found to have acute cva - left frontal ischemia and old cerebellar infarcts.   Subjective: Pt seen in radiology for MBS to objectively assess swallow function and safety, and identify least restrictive diet. Assessment / Plan / Recommendation CHL IP CLINICAL IMPRESSIONS 09/18/2017 Clinical Impression Pt presents with moderate oral, mild pharyngeal dysphagia, characterized as follows: ORALLY, pt exhibits significant xerostomia. Poor labial seal is apparent, with right anterior leakage of most po trials. Pt has difficulty with bolus formation and posterior propulsion, and exhibits diffuse residue throughout the oral cavity after the swallow, which is largely piecemeal. Pt was unable to chew mechanical soft solids, so this consistency was removed from the oral cavity. Following the study, pt was noted to have continued right anterior leakage due to diffuse residue. PHARYNGEALLY, pt exhibits slightly delayed swallow reflex with reduced epiglottic inversion intermittently. Diffuse tongue base and vallecular residue noted after the swallow, which clears with dry swallows. There was no penetration or aspiration of any consistency, however, pt is at increased risk due to oropharyngeal residue. Thorough  oral care is strongly encouraged, both before and after meals to decrease bacterial load, and minimize aspiration risk. Crush meds in puree. Safe swallow precautions sent back to pt room with transport. SLP will follow for diet tolerance assessment and education.  SLP Visit Diagnosis Dysphagia, oropharyngeal phase (R13.12) Impact on safety and function Mild aspiration risk;Moderate aspiration risk;Risk for inadequate nutrition/hydration   CHL  IP TREATMENT RECOMMENDATION 09/18/2017 Treatment Recommendations Therapy as outlined in treatment plan below   Prognosis 09/18/2017 Prognosis for Safe Diet Advancement Guarded Barriers to Reach Goals Severity of deficits;Cognitive deficits CHL IP DIET RECOMMENDATION 09/18/2017 SLP Diet Recommendations Dysphagia 1 (Puree) solids;Thin liquid Liquid Administration via Straw Medication Administration Crushed with puree Compensations Minimize environmental distractions;Slow rate;Small sips/bites;Lingual sweep for clearance of pocketing;Follow solids with liquid Postural Changes Position upright at 90 degrees Remain at 45 degrees for 30 minutes after meals    CHL IP OTHER RECOMMENDATIONS 09/18/2017 Oral Care Recommendations Oral care before and after PO Other Recommendations Have oral suction available   CHL IP FOLLOW UP RECOMMENDATIONS 09/18/2017 Follow up Recommendations Skilled Nursing facility;24 hour supervision/assistance   CHL IP FREQUENCY AND DURATION 09/18/2017 Speech Therapy Frequency (ACUTE ONLY) min 2x/week Treatment Duration 1 week;2 weeks      CHL IP ORAL PHASE 09/18/2017 Oral Phase Impaired Oral - Nectar Straw Right anterior bolus loss;Weak lingual manipulation;Right pocketing in lateral sulci;Reduced posterior propulsion;Lingual/palatal residue;Premature spillage;Delayed oral transit;Piecemeal swallowing;Holding of bolus;Decreased bolus cohesion Oral - Thin Straw Right anterior bolus loss;Weak lingual manipulation;Right pocketing in lateral sulci;Reduced posterior  propulsion;Lingual/palatal residue;Premature spillage;Delayed oral transit;Piecemeal swallowing;Holding of bolus;Decreased bolus cohesion Oral - Puree Right pocketing in lateral sulci;Reduced posterior propulsion;Lingual/palatal residue;Premature spillage;Delayed oral transit;Piecemeal swallowing;Holding of bolus;Decreased bolus cohesion Oral - Mech Soft Pt unable to chew cracker, so it was removed from oral cavity.  CHL IP PHARYNGEAL PHASE 09/18/2017 Pharyngeal Phase Impaired Pharyngeal- Nectar Straw Delayed swallow initiation-vallecula;Reduced epiglottic inversion;Reduced tongue base retraction;Pharyngeal residue - valleculae;Pharyngeal residue - posterior pharnyx Pharyngeal- Thin Straw Delayed swallow initiation-vallecula;Reduced epiglottic inversion;Reduced tongue base retraction;Pharyngeal residue - valleculae;Pharyngeal residue - posterior pharnyx Pharyngeal- Puree Delayed swallow initiation-vallecula;Reduced epiglottic inversion;Reduced tongue base retraction;Pharyngeal residue - valleculae;Pharyngeal residue - posterior pharnyx  CHL IP CERVICAL ESOPHAGEAL PHASE 09/18/2017 Cervical Esophageal Phase [facility], CCC-SLP Speech Language Pathologist [phone] Shonna Chock 09/18/2017, 12:39 PM               Scheduled Meds: .  stroke: mapping our early stages of recovery book   Does not apply Once  . apixaban  2.5 mg Oral BID  . atorvastatin  40 mg Oral q1800  . diltiazem  60 mg Oral Q6H  . ketorolac  15 mg Intravenous Q6H  . levothyroxine  50 mcg Oral QAC breakfast  . LORazepam  0.5 mg Oral BID  . polyvinyl alcohol  1 drop Both Eyes TID   Continuous Infusions: . sodium chloride 1,000 mL (09/19/17 0318)  . diltiazem (CARDIZEM) infusion Stopped (09/18/17 1929)     LOS: 3 days   Marylu Lund, MD [facility] Pager [phone]  If 7PM-7AM, please contact night-coverage [email or web address] Password [facility] 09/19/2017, 6:07 PM

## 2017-09-19 NOTE — Clinical Social Work Note (Addendum)
Facility has received authorization for pt to return to SNF.  Marisa MediateBridget Anessa Smith, MSW [phone]

## 2017-09-20 ENCOUNTER — Inpatient Hospital Stay (HOSPITAL_COMMUNITY): Payer: Medicare Other

## 2017-09-20 LAB — BASIC METABOLIC PANEL
ANION GAP: 7 (ref 5–15)
BUN: 18 mg/dL (ref 8–23)
CALCIUM: 8.1 mg/dL — AB (ref 8.9–10.3)
CO2: 19 mmol/L — AB (ref 22–32)
Chloride: 109 mmol/L (ref 98–111)
Creatinine, Ser: 0.82 mg/dL (ref 0.44–1.00)
GFR calc Af Amer: >60 mL/min (ref >60)
GFR calc non Af Amer: >60 mL/min (ref >60)
GLUCOSE: 117 mg/dL — AB (ref 70–99)
Potassium: 3.4 mmol/L — ABNORMAL LOW (ref 3.5–5.1)
Sodium: 135 mmol/L (ref 135–145)

## 2017-09-20 MED ORDER — METOPROLOL TARTRATE 12.5 MG HALF TABLET
12.5000 mg | ORAL_TABLET | Freq: Two times a day (BID) | ORAL | Status: DC
  Administered 2017-09-20 – 2017-09-21 (×2): 12.5 mg via ORAL
  Filled 2017-09-20 (×2): qty 1

## 2017-09-20 MED ORDER — DILTIAZEM HCL 30 MG PO TABS
90.0000 mg | ORAL_TABLET | Freq: Four times a day (QID) | ORAL | Status: DC
  Administered 2017-09-20 – 2017-09-21 (×5): 90 mg via ORAL
  Filled 2017-09-20 (×6): qty 3

## 2017-09-20 MED ORDER — POTASSIUM CHLORIDE CRYS ER 20 MEQ PO TBCR
40.0000 meq | EXTENDED_RELEASE_TABLET | Freq: Once | ORAL | Status: AC
  Administered 2017-09-20: 40 meq via ORAL
  Filled 2017-09-20: qty 2

## 2017-09-20 MED ORDER — LISINOPRIL 5 MG PO TABS
5.0000 mg | ORAL_TABLET | Freq: Every day | ORAL | Status: DC
  Filled 2017-09-20: qty 1

## 2017-09-20 NOTE — Discharge Instructions (Signed)

## 2017-09-20 NOTE — Progress Notes (Signed)
PROGRESS NOTE    Marisa Smith  PRF:[id number] DOB: [birth year] DOA: 09/16/2017 PCP: Lajean Manes, MD    Brief Narrative:  84 y.o. female with medical history significant of hypothyroidism; HTN; afib not on AC; and autoimmune hepatitis presenting with "stroke symptoms."   She was admitted 6/24-26 for an acute compression fracture at L1 complicated by hyponatremia.   She has marked aphasia and is unable to effectively communicate, but she does appear to be A&O x 2.  Further history is not able to be obtained at this time.  HPI per Dr. Gilford Raid: Pt presents to the ED today as a stroke alert. The pt has a hx of afib not on anticoagulation. She was lsn at 1100 at her living facility. Nurse practitioner found her with right facial droop and aphasia around noon. Stroke team met pt at the bridge and she went immediately to CT. Pt has a right sided facial droop and is in afib with rvr. The pt did have a mri which showed cva. Pt d/w [facility] for admission   ED Course:  H/o afib, not on AC.  MRI with multifocal acute strokes - R facial droop, dysarthria.  Neurology has seen - can be started on Heparin if appropriate without bolus.  Had RVR, given Dilt.  Possible aspiration PNA, started on antibiotics.  Low K+- repleted  Assessment & Plan:   Principal Problem:   CVA (cerebral vascular accident) ([place]) Active Problems:   Hypothyroidism   HYPERTENSION, BENIGN ESSENTIAL   A-fib ([place])   AKI (acute kidney injury) ([place])   Aspiration pneumonia due to vomit ([place])   Goals of care, counseling/discussion   Generalized anxiety disorder   Palliative care by specialist  CVA -Patient with h/o afib not on AC, presented with acute R facial droop and aphasia -MRI/CTA confirms multiple foci of acute ischemia within the left hemisphere with the largest lesions located in the left frontal lobe.  Also with punctate subacute right cerebellar infarct and multiple old cerebellar infarcts. -Continued on  tele -Echo performed, pending results -SLP initially with recommendations for NPO with alternative means of nutrition/meds. Patient has since passed swallow and cleared for diet -SW consult for placement, plan for SNF on d/c -Have transitioned off heparin gtt and on eliquis -Neurology has since signed off -Seems to be improved this AM  HTN -Had allowed permissive HTN -Neurology recommends gradual normalization of BP in the next 5-7 days -Increased dose of cardizem this AM, see below -Plan to add lisinopril to further improve BP gradually  HLD -LDL noted to be 94 -Presently stable at this time  Afib, previously not on AC -Rate controlled at home on Verapamil -Patient currently on cardizem gtt, relatively rate controlled -Passed swallow. Currently on PO cardizem -Now off heparin gtt and on eliquis  AKI -Baseline creatinine 0.6, currently 1.08 -Cr has improved with IVF hydration -Currently stable  Hypothyroidism -TSH elevated, however free t4 within normal range -Cont with replacement as patient tolerates  Tachypnea -Shallow breathing noted -Repeat CXR ordered and reviewed. Findings suggestive of atalectasis with no fevers or leukocytosis noted  DVT prophylaxis: Heparin gtt Code Status: DNR Family Communication: Pt in room, family not at bedside, updated daughter over phone 8/15 Disposition Plan: Uncertain at this time  Consultants:   Neurology  Palliative Care  Procedures:     Antimicrobials: Anti-infectives (From admission, onward)   Start     Dose/Rate Route Frequency Ordered Stop   09/17/17 1000  vancomycin (VANCOCIN) 500 mg in sodium  chloride 0.9 % 100 mL IVPB  Status:  Discontinued     500 mg 100 mL/hr over 60 Minutes Intravenous Every 24 hours 09/16/17 1433 09/16/17 1541   09/16/17 1500  ceFEPIme (MAXIPIME) 2 g in sodium chloride 0.9 % 100 mL IVPB     2 g 200 mL/hr over 30 Minutes Intravenous  Once 09/16/17 1428 09/16/17 1601   09/16/17 1445   vancomycin (VANCOCIN) IVPB 1000 mg/200 mL premix     1,000 mg 200 mL/hr over 60 Minutes Intravenous  Once 09/16/17 1433 09/16/17 1610      Subjective: No complaints this AM  Objective: Vitals:   09/20/17 0324 09/20/17 0725 09/20/17 0817 09/20/17 1124  BP: (!) 170/134 (!) 118/93  (!) 144/108  Pulse: 94 (!) 127  (!) 46  Resp: (!) 30 (!) 32  (!) 27  Temp: 98.4 F (36.9 C) (!) 97 F (36.1 C) 97.7 F (36.5 C) 98.9 F (37.2 C)  TempSrc: Oral  Oral Oral  SpO2: 95%     Weight:      Height:       No intake or output data in the 24 hours ending 09/20/17 1457 Filed Weights   09/16/17 1200  Weight: 50.7 kg    Examination: General exam: Awake, laying in bed, in nad Respiratory system: Normal respiratory effort, no wheezing Cardiovascular system: regular rate, s1, s2 Gastrointestinal system: Soft, nondistended, positive BS Central nervous system: CN2-12 grossly intact, No seizures or tremors Extremities: Perfused, no clubbing Skin: Normal skin turgor, no notable skin lesions seen Psychiatry: Mood normal // no visual hallucinations \  Data Reviewed: I have personally reviewed following labs and imaging studies  CBC: Recent Labs  Lab 09/16/17 1233 09/16/17 1240 09/18/17 0412  WBC 9.2  --  7.7  NEUTROABS 6.6  --   --   HGB 13.3 13.9 12.2  HCT 39.6 41.0 37.4  MCV 99.2  --  100.8*  PLT 264  --  462   Basic Metabolic Panel: Recent Labs  Lab 09/16/17 1233 09/16/17 1240 09/16/17 1500 09/17/17 0218 09/18/17 0412 09/19/17 0451 09/20/17 0327  NA 134* 134*  --  132* 133* 134* 135  K 2.7* 2.7*  --  3.3* 3.5 3.1* 3.4*  CL 94* 94*  --  99 101 106 109  CO2 26  --   --  22 18* 21* 19*  GLUCOSE 102* 98  --  91 69* 103* 117*  BUN 19 21  --  '16 19 19 18  '$ CREATININE 1.08* 1.00  --  0.91 0.89 0.78 0.82  CALCIUM 9.1  --   --  8.3* 8.3* 8.1* 8.1*  MG  --   --  2.0  --   --   --   --   PHOS  --   --  2.8  --   --   --   --    GFR: Estimated Creatinine Clearance: 35.8 mL/min (by  C-G formula based on SCr of 0.82 mg/dL). Liver Function Tests: Recent Labs  Lab 09/16/17 1233  AST 19  ALT 10  ALKPHOS 126  BILITOT 1.3*  PROT 7.0  ALBUMIN 3.2*   No results for input(s): LIPASE, AMYLASE in the last 168 hours. No results for input(s): AMMONIA in the last 168 hours. Coagulation Profile: Recent Labs  Lab 09/16/17 1233 09/17/17 0218  INR 1.07 1.15   Cardiac Enzymes: No results for input(s): CKTOTAL, CKMB, CKMBINDEX, TROPONINI in the last 168 hours. BNP (last 3 results) No results for  input(s): PROBNP in the last 8760 hours. HbA1C: No results for input(s): HGBA1C in the last 72 hours. CBG: Recent Labs  Lab 09/16/17 1233  GLUCAP 94   Lipid Profile: No results for input(s): CHOL, HDL, LDLCALC, TRIG, CHOLHDL, LDLDIRECT in the last 72 hours. Thyroid Function Tests: No results for input(s): TSH, T4TOTAL, FREET4, T3FREE, THYROIDAB in the last 72 hours. Anemia Panel: No results for input(s): VITAMINB12, FOLATE, FERRITIN, TIBC, IRON, RETICCTPCT in the last 72 hours. Sepsis Labs: No results for input(s): PROCALCITON, LATICACIDVEN in the last 168 hours.  No results found for this or any previous visit (from the past 240 hour(s)).   Radiology Studies: Dg Chest Port 1 View  Result Date: 09/20/2017 CLINICAL DATA:  Aspiration pneumonia EXAM: PORTABLE CHEST 1 VIEW COMPARISON:  09/16/2017 FINDINGS: Cardiomediastinal silhouette is unchanged. Small bilateral pleural effusions, LEFT-greater-than-RIGHT, LEFT LOWER lung consolidation/atelectasis and mild RIGHT basilar airspace disease/atelectasis are unchanged. There is no evidence of pneumothorax. No other changes are noted. IMPRESSION: Unchanged appearance of the chest with small bilateral pleural effusions, LEFT-greater-than-RIGHT, LEFT LOWER lung consolidation/atelectasis and mild RIGHT basilar airspace disease/atelectasis. Electronically Signed   By: Margarette Canada M.D.   On: 09/20/2017 14:20    Scheduled Meds: .  stroke:  mapping our early stages of recovery book   Does not apply Once  . apixaban  2.5 mg Oral BID  . atorvastatin  40 mg Oral q1800  . diltiazem  90 mg Oral Q6H  . ketorolac  15 mg Intravenous Q6H  . levothyroxine  50 mcg Oral QAC breakfast  . LORazepam  0.5 mg Oral BID  . polyvinyl alcohol  1 drop Both Eyes TID   Continuous Infusions: . sodium chloride 50 mL/hr at 09/20/17 0100     LOS: 4 days   Marylu Lund, MD [facility] Pager [phone]  If 7PM-7AM, please contact night-coverage [email or web address] Password [facility] 09/20/2017, 2:57 PM

## 2017-09-21 DIAGNOSIS — N179 Acute kidney failure, unspecified: Secondary | ICD-10-CM | POA: Diagnosis not present

## 2017-09-21 DIAGNOSIS — N189 Chronic kidney disease, unspecified: Secondary | ICD-10-CM | POA: Diagnosis not present

## 2017-09-21 DIAGNOSIS — M810 Age-related osteoporosis without current pathological fracture: Secondary | ICD-10-CM | POA: Diagnosis not present

## 2017-09-21 DIAGNOSIS — F330 Major depressive disorder, recurrent, mild: Secondary | ICD-10-CM | POA: Diagnosis not present

## 2017-09-21 DIAGNOSIS — E039 Hypothyroidism, unspecified: Secondary | ICD-10-CM | POA: Diagnosis not present

## 2017-09-21 DIAGNOSIS — G3184 Mild cognitive impairment, so stated: Secondary | ICD-10-CM | POA: Diagnosis not present

## 2017-09-21 DIAGNOSIS — I6300 Cerebral infarction due to thrombosis of unspecified precerebral artery: Secondary | ICD-10-CM | POA: Diagnosis not present

## 2017-09-21 DIAGNOSIS — Z743 Need for continuous supervision: Secondary | ICD-10-CM | POA: Diagnosis not present

## 2017-09-21 DIAGNOSIS — I10 Essential (primary) hypertension: Secondary | ICD-10-CM | POA: Diagnosis not present

## 2017-09-21 DIAGNOSIS — F419 Anxiety disorder, unspecified: Secondary | ICD-10-CM | POA: Diagnosis not present

## 2017-09-21 DIAGNOSIS — K754 Autoimmune hepatitis: Secondary | ICD-10-CM | POA: Diagnosis not present

## 2017-09-21 DIAGNOSIS — R159 Full incontinence of feces: Secondary | ICD-10-CM | POA: Diagnosis not present

## 2017-09-21 DIAGNOSIS — G8929 Other chronic pain: Secondary | ICD-10-CM | POA: Diagnosis not present

## 2017-09-21 DIAGNOSIS — M623 Immobility syndrome (paraplegic): Secondary | ICD-10-CM | POA: Diagnosis not present

## 2017-09-21 DIAGNOSIS — Z8673 Personal history of transient ischemic attack (TIA), and cerebral infarction without residual deficits: Secondary | ICD-10-CM | POA: Diagnosis not present

## 2017-09-21 DIAGNOSIS — R41841 Cognitive communication deficit: Secondary | ICD-10-CM | POA: Diagnosis not present

## 2017-09-21 DIAGNOSIS — I69320 Aphasia following cerebral infarction: Secondary | ICD-10-CM | POA: Diagnosis not present

## 2017-09-21 DIAGNOSIS — I482 Chronic atrial fibrillation: Secondary | ICD-10-CM | POA: Diagnosis not present

## 2017-09-21 DIAGNOSIS — R2689 Other abnormalities of gait and mobility: Secondary | ICD-10-CM | POA: Diagnosis not present

## 2017-09-21 DIAGNOSIS — I4891 Unspecified atrial fibrillation: Secondary | ICD-10-CM | POA: Diagnosis not present

## 2017-09-21 DIAGNOSIS — K5900 Constipation, unspecified: Secondary | ICD-10-CM | POA: Diagnosis not present

## 2017-09-21 DIAGNOSIS — I6789 Other cerebrovascular disease: Secondary | ICD-10-CM | POA: Diagnosis not present

## 2017-09-21 DIAGNOSIS — G459 Transient cerebral ischemic attack, unspecified: Secondary | ICD-10-CM | POA: Diagnosis not present

## 2017-09-21 DIAGNOSIS — R52 Pain, unspecified: Secondary | ICD-10-CM | POA: Diagnosis not present

## 2017-09-21 DIAGNOSIS — R1312 Dysphagia, oropharyngeal phase: Secondary | ICD-10-CM | POA: Diagnosis not present

## 2017-09-21 DIAGNOSIS — I639 Cerebral infarction, unspecified: Secondary | ICD-10-CM | POA: Diagnosis not present

## 2017-09-21 DIAGNOSIS — R0902 Hypoxemia: Secondary | ICD-10-CM | POA: Diagnosis not present

## 2017-09-21 DIAGNOSIS — M7981 Nontraumatic hematoma of soft tissue: Secondary | ICD-10-CM | POA: Diagnosis not present

## 2017-09-21 DIAGNOSIS — M6281 Muscle weakness (generalized): Secondary | ICD-10-CM | POA: Diagnosis not present

## 2017-09-21 DIAGNOSIS — R279 Unspecified lack of coordination: Secondary | ICD-10-CM | POA: Diagnosis not present

## 2017-09-21 DIAGNOSIS — E785 Hyperlipidemia, unspecified: Secondary | ICD-10-CM | POA: Diagnosis not present

## 2017-09-21 DIAGNOSIS — H01009 Unspecified blepharitis unspecified eye, unspecified eyelid: Secondary | ICD-10-CM | POA: Diagnosis not present

## 2017-09-21 LAB — BASIC METABOLIC PANEL
Anion gap: 8 (ref 5–15)
BUN: 17 mg/dL (ref 8–23)
CO2: 21 mmol/L — ABNORMAL LOW (ref 22–32)
Calcium: 8.5 mg/dL — ABNORMAL LOW (ref 8.9–10.3)
Chloride: 108 mmol/L (ref 98–111)
Creatinine, Ser: 0.7 mg/dL (ref 0.44–1.00)
GFR calc Af Amer: >60 mL/min (ref >60)
GFR calc non Af Amer: >60 mL/min (ref >60)
GLUCOSE: 114 mg/dL — AB (ref 70–99)
POTASSIUM: 3.9 mmol/L (ref 3.5–5.1)
Sodium: 137 mmol/L (ref 135–145)

## 2017-09-21 LAB — CBC
HCT: 38.3 % (ref 36.0–46.0)
HEMOGLOBIN: 12.5 g/dL (ref 12.0–15.0)
MCH: 33.1 pg (ref 26.0–34.0)
MCHC: 32.6 g/dL (ref 30.0–36.0)
MCV: 101.3 fL — ABNORMAL HIGH (ref 78.0–100.0)
Platelets: 212 10*3/uL (ref 150–400)
RBC: 3.78 MIL/uL — AB (ref 3.87–5.11)
RDW: 14.3 % (ref 11.5–15.5)
WBC: 7 10*3/uL (ref 4.0–10.5)

## 2017-09-21 MED ORDER — DILTIAZEM HCL 90 MG PO TABS: 90.0000 mg | ORAL_TABLET | Freq: Four times a day (QID) | ORAL | 0 refills | Status: DC

## 2017-09-21 MED ORDER — METOPROLOL TARTRATE 25 MG PO TABS: 12.5000 mg | ORAL_TABLET | Freq: Two times a day (BID) | ORAL | 0 refills | Status: DC

## 2017-09-21 MED ORDER — APIXABAN 2.5 MG PO TABS: 2.5000 mg | ORAL_TABLET | Freq: Two times a day (BID) | ORAL | Status: DC

## 2017-09-21 MED ORDER — HYDROCODONE-ACETAMINOPHEN 5-325 MG PO TABS: 1.0000 | ORAL_TABLET | Freq: Four times a day (QID) | ORAL | 0 refills | Status: AC | PRN

## 2017-09-21 MED ORDER — LORAZEPAM 0.5 MG PO TABS: 0.5000 mg | ORAL_TABLET | Freq: Two times a day (BID) | ORAL | 4 refills | Status: AC

## 2017-09-21 MED ORDER — ATORVASTATIN CALCIUM 40 MG PO TABS: 40.0000 mg | ORAL_TABLET | Freq: Every day | ORAL | 0 refills | Status: DC

## 2017-09-21 MED ORDER — ADULT MULTIVITAMIN W/MINERALS CH
1.0000 | ORAL_TABLET | Freq: Every day | ORAL | Status: DC
  Filled 2017-09-21: qty 1

## 2017-09-21 NOTE — Progress Notes (Signed)
Nutrition Follow-up  DOCUMENTATION CODES:   Severe malnutrition in context of chronic illness, Underweight  INTERVENTION:   -Magic Cup TID with meals, each supplement provides 290 kcals and 9 grams protein -Hormel Shake TID with meals, each supplement provides 520 kcals and 22 grams protein -MVI with minerals daily  NUTRITION DIAGNOSIS:   Severe Malnutrition related to chronic illness(CVA) as evidenced by moderate fat depletion, severe fat depletion, moderate muscle depletion, severe muscle depletion.  Ongoing  GOAL:   Patient will meet greater than or equal to 90% of their needs  Progressing  MONITOR:   PO intake, Supplement acceptance, Diet advancement, Labs, Weight trends, Skin, I & O's  REASON FOR ASSESSMENT:   Consult Other (Comment)(CVA)  ASSESSMENT:   [name] with hypothyroidism, recurrent headache, autoimmune hepatitis, atrial fibrillation not on anticoagulant brought here via EMS for stroke symptoms.   8/16- s/p MBSS, advanced to dysphagia 1 diet with thin liquids  Spoke with pt, who reports she is not feeling well today. She was unable to respond to any other of this RD's questions.   Reviewed SLP and palliative care notes. Pt and family does not desire at feeding tube. Suspect intake will remain inadequate. No meal completion documentation available to review. Noted pt consumed a few bites of applesauce on tray table. Plan to d/c to SNF once medically stable with palliative follow-up.   Labs reviewed: K: 3.4.   NUTRITION - FOCUSED PHYSICAL EXAM:    Most Recent Value  Orbital Region  Moderate depletion  Upper Arm Region  Severe depletion  Thoracic and Lumbar Region  Severe depletion  Buccal Region  Moderate depletion  Temple Region  Moderate depletion  Clavicle Bone Region  Severe depletion  Clavicle and Acromion Bone Region  Severe depletion  Scapular Bone Region  Severe depletion  Dorsal Hand  Severe depletion  Patellar Region  Severe  depletion  Anterior Thigh Region  Moderate depletion  Posterior Calf Region  Severe depletion  Edema (RD Assessment)  None  Hair  Reviewed  Eyes  Reviewed  Mouth  Reviewed  Skin  Reviewed  Nails  Reviewed       Diet Order:   Diet Order            DIET - DYS 1 Room service appropriate? Yes; Fluid consistency: Thin  Diet effective now              EDUCATION NEEDS:   Not appropriate for education at this time  Skin:  Skin Assessment: Reviewed RN Assessment  Last BM:  09/20/17  Height:   Ht Readings from Last 1 Encounters:  09/16/17 5\' 7"  (1.702 m)    Weight:   Wt Readings from Last 1 Encounters:  09/16/17 50.7 kg    Ideal Body Weight:  61.3 kg  BMI:  Body mass index is 17.51 kg/m.  Estimated Nutritional Needs:   Kcal:  1000-1300  Protein:  50-65 gm  Fluid:  >/= 1.5 L    Arthur Aydelotte A. Mayford KnifeWilliams, RD, LDN, CDE Pager: [phone] After hours Pager: [phone]

## 2017-09-21 NOTE — Progress Notes (Signed)
[facility]

## 2017-09-21 NOTE — Progress Notes (Signed)
Physical Therapy Treatment Patient Details Name: Marisa Smith MRN: [id number] DOB: [birth year] Today's Date: 09/21/2017    History of Present Illness Patient is a 84 y/o female presenting to the ED on 09/16/17 with stroke like symptoms: facial droop and aphasia.  CT head: Small age-indeterminate infarct in the left frontal cortex, Small age-indeterminate right superior cerebellar infarct. MRI brain: Multiple foci of acute ischemia within the left hemisphere with the largest lesions located in the left frontal lobe, Punctate subacute right cerebellar infarct, Multiple old cerebellar infarcts and advanced chronic ischemic microangiopathy. PMH significant for hypothyroidism; HTN; afib not on AC; and autoimmune hepatitis.    PT Comments    [name] much more alert today and able to verbalize desire to transfer to recliner. Improved bed mobility today with only Mod A for rolling and max A for supine to sit. Does require Max A+2 for transfer OOB, but with effort noted from patient.  Consistent verbal cueing for upright posturing and to maintain midline with limited carryover. Good improvements towards established goals from prior session. PT recs remain appropriate.    Follow Up Recommendations  SNF;Supervision/Assistance - 24 hour     Equipment Recommendations  (TBD)    Recommendations for Other Services OT consult     Precautions / Restrictions Precautions Precautions: Fall(skin breakdown) Restrictions Weight Bearing Restrictions: No    Mobility  Bed Mobility Overal bed mobility: Needs Assistance Bed Mobility: Supine to Sit;Rolling Rolling: Mod assist   Supine to sit: Max assist;+2 for physical assistance     General bed mobility comments: Max A +2 for supine to sit - able to initiate bringing LE towards EOB as well as reaching with UE to assist with pulling up  Transfers Overall transfer level: Needs assistance Equipment used: 2 person hand held assist Transfers: Sit to/from  BJ'sStand;Stand Pivot Transfers Sit to Stand: Max assist;+2 physical assistance Stand pivot transfers: Max assist;+2 physical assistance       General transfer comment: Max A +2 to power up at bedside; able to attempt to shuffle feet for pivot transfer;   Ambulation/Gait                 Stairs             Wheelchair Mobility    Modified Rankin (Stroke Patients Only) Modified Rankin (Stroke Patients Only) Pre-Morbid Rankin Score: Moderately severe disability Modified Rankin: Severe disability     Balance Overall balance assessment: Needs assistance Sitting-balance support: Bilateral upper extremity supported;Feet supported Sitting balance-Leahy Scale: Poor     Standing balance support: Bilateral upper extremity supported;During functional activity Standing balance-Leahy Scale: Zero                              Cognition Arousal/Alertness: Awake/alert Behavior During Therapy: Flat affect Overall Cognitive Status: No family/caregiver present to determine baseline cognitive functioning                                 General Comments: more conversative today - able to state desire of getting up to chair as well as being embarrassed regarding pericare      Exercises      General Comments        Pertinent Vitals/Pain Pain Assessment: No/denies pain    Home Living  Prior Function            PT Goals (current goals can now be found in the care plan section) Acute Rehab PT Goals Patient Stated Goal: unable to state PT Goal Formulation: With patient Time For Goal Achievement: 10/01/17 Potential to Achieve Goals: Fair Progress towards PT goals: Progressing toward goals    Frequency    Min 2X/week      PT Plan Current plan remains appropriate    Co-evaluation              AM-PAC PT "6 Clicks" Daily Activity  Outcome Measure  Difficulty turning over in bed (including adjusting  bedclothes, sheets and blankets)?: Unable Difficulty moving from lying on back to sitting on the side of the bed? : Unable Difficulty sitting down on and standing up from a chair with arms (e.g., wheelchair, bedside commode, etc,.)?: Unable Help needed moving to and from a bed to chair (including a wheelchair)?: A Lot Help needed walking in hospital room?: Total Help needed climbing 3-5 steps with a railing? : Total 6 Click Score: 7    End of Session Equipment Utilized During Treatment: Gait belt Activity Tolerance: Patient tolerated treatment well Patient left: in chair;with call bell/phone within reach;with chair alarm set Nurse Communication: Mobility status PT Visit Diagnosis: Unsteadiness on feet (R26.81);Other abnormalities of gait and mobility (R26.89);Muscle weakness (generalized) (M62.81)     Time: [id number] PT Time Calculation (min) (ACUTE ONLY): 30 min  Charges:  $Therapeutic Activity: 23-37 mins                     Kipp LaurenceStephanie R Aaron, PT, DPT 09/21/17 12:23 PM Pager: [phone]

## 2017-09-21 NOTE — Discharge Summary (Addendum)
Physician Discharge Summary  Marisa Smith MEQ:[id number] DOB: 1924/08/10 DOA: 09/16/2017  PCP: Lajean Manes, MD  Admit date: 09/16/2017 Discharge date: 09/21/2017  Admitted From: SNF Disposition:  SNF  Recommendations for Outpatient Follow-up:  1. Follow up with PCP in 1-2 weeks 2. Follow up with Neurology as scheduled 3. Recommend Palliative Care referral at facility please  Discharge Condition:Stable CODE STATUS:DNR Diet recommendation: Dysphagia 1 with thin liquids   Brief/Interim Summary: 84 y.o.femalewith medical history significant ofhypothyroidism; HTN;afib not on AC;and autoimmune hepatitis presenting with "stroke symptoms."She was admitted 6/24-26 for an acute compression fracture at L1 complicated by hyponatremia. She has marked aphasia and is unable to effectively communicate, but she does appear to be A&O x 2. Further history is not able to be obtained at this time.  HPI per Dr. Gilford Raid: Pt presents to the ED today as a stroke alert. The pt has a hx of afib not on anticoagulation. She was lsn at 1100 at her living facility. Nurse practitioner found her with right facial droop and aphasia around noon. Stroke team met pt at the bridge and she went immediately to CT. Pt has a right sided facial droop and is in afib with rvr. The pt did have a mri which showed cva. Pt d/w triad hospitalists for admission   ED Course:H/o afib, not on AC. MRI with multifocal acute strokes - R facial droop, dysarthria. Neurology has seen - can be started on Heparin if appropriate without bolus. Had RVR, given Dilt. Possible aspiration PNA, started on antibiotics. Low K+- repleted   CVA -Patient with h/o afib not on AC, presented with acuteR facial droop and aphasia -MRI/CTA confirms multiple foci of acute ischemia within the left hemisphere with the largest lesions located in the left frontal lobe. Also with punctate subacute right cerebellar infarct and multiple old  cerebellar infarcts -Echo performed, pending results -SLP initially with recommendations for NPO with alternative means of nutrition/meds. Patient has since passed swallow and cleared for diet for dysphagia 1 with thin liquids -SW consult for placement, plan for SNF on d/c -Have transitioned off heparin gtt and on eliquis -Neurology has since signed off -Seems to be improved this AM  HTN -Had initially allowed permissive HTN -Increased dose of cardizem, see below -added metoprolol for increased BP control  HLD -LDL noted to be 94 -On statin  Afib, previously not on AC, clinically undetermined if chronic or paroxysmal  -Rate controlled at home on Verapamil -Patient currently on cardizem gtt, relatively rate controlled -Passed swallow. Currently on PO cardizem -Now off heparin gtt and on eliquis -Added metoprolol for better rate control  AKI -Baseline creatinine 0.6, currently 1.08 -Cr has improved with IVF hydration -Remains stable at this time  Hypothyroidism -TSH elevated, however free t4 within normal range -Cont with replacement as patient tolerates  Tachypnea -Shallow breathing noted -Repeat CXR ordered and reviewed. Findings suggestive of atalectasis with no fevers or leukocytosis noted  Sever protein calorie malnutrition  Discharge Diagnoses:  Principal Problem:   CVA (cerebral vascular accident) ([place]) Active Problems:   Hypothyroidism   HYPERTENSION, BENIGN ESSENTIAL   A-fib ([place])   AKI (acute kidney injury) ([place])   Aspiration pneumonia due to vomit ([place])   Goals of care, counseling/discussion   Generalized anxiety disorder   Palliative care by specialist    Discharge Instructions  Discharge Instructions    Ambulatory referral to Neurology   Complete by:  As directed    Follow up with stroke clinic NP Venancio Poisson  or Cecille Rubin, if both not available, consider Leonie Man, [place], or [place]) at [facility] in about 4 weeks. Thanks.      Allergies as of 09/21/2017      Reactions   Amlodipine Besylate    REACTION: itching   Azatadine    Per MAR?   Azathioprine    REACTION: nausea   Penicillins    REACTION: yeast infection Per MAR   Sertraline Hcl    REACTION: rash   Sulfonamide Derivatives    REACTION: swelling and itching      Medication List    STOP taking these medications   verapamil 120 MG 24 hr capsule Commonly known as:  VERELAN PM     TAKE these medications   acetaminophen 500 MG tablet Commonly known as:  TYLENOL Take 1,000 mg by mouth every 6 (six) hours as needed for mild pain or headache.   acetaminophen 325 MG tablet Commonly known as:  TYLENOL Take 325 mg by mouth every 4 (four) hours as needed for mild pain (right hip pain).   apixaban 2.5 MG Tabs tablet Commonly known as:  ELIQUIS Take 1 tablet (2.5 mg total) by mouth 2 (two) times daily.   ARTIFICIAL TEARS 1-0.3 % Soln Generic drug:  Propylene Glycol-Glycerin Place 1 drop into both eyes 3 (three) times daily.   aspirin EC 81 MG tablet Take 81 mg by mouth daily.   atorvastatin 40 MG tablet Commonly known as:  LIPITOR Take 1 tablet (40 mg total) by mouth daily at 6 PM.   BIOFREEZE ROLL-ON 4 % Gel Generic drug:  Menthol (Topical Analgesic) Apply 1 application topically 3 (three) times daily.   busPIRone 15 MG tablet Commonly known as:  BUSPAR Take 15 mg by mouth 2 (two) times daily.   Calcium Carbonate-Vitamin D 600-200 MG-UNIT Caps Take 1 capsule by mouth daily.   cetirizine 10 MG tablet Commonly known as:  ZYRTEC Take 10 mg by mouth daily.   diltiazem 90 MG tablet Commonly known as:  CARDIZEM Take 1 tablet (90 mg total) by mouth every 6 (six) hours.   Glycerin-Hypromellose-PEG 400 0.2-0.36-1 % Soln Place 1 drop into both eyes 3 (three) times daily.   HYDROcodone-acetaminophen 5-325 MG tablet Commonly known as:  NORCO/VICODIN Take 1 tablet by mouth every 6 (six) hours as needed for moderate pain. What changed:     when to take this  reasons to take this   lactulose 10 GM/15ML solution Commonly known as:  CHRONULAC Take 30 mLs by mouth 2 (two) times daily as needed.   levothyroxine 50 MCG tablet Commonly known as:  SYNTHROID, LEVOTHROID Take 1 tablet (50 mcg total) by mouth daily before breakfast.   LORazepam 0.5 MG tablet Commonly known as:  ATIVAN Take 1 tablet (0.5 mg total) by mouth 2 (two) times daily.   mercaptopurine 50 MG tablet Commonly known as:  PURINETHOL Take 0.5 tablets (25 mg total) by mouth daily. Give on an empty stomach 1 hour before or 2 hours after meals. Caution: Chemotherapy.   metoprolol tartrate 25 MG tablet Commonly known as:  LOPRESSOR Take 0.5 tablets (12.5 mg total) by mouth 2 (two) times daily.   mirtazapine 45 MG tablet Commonly known as:  REMERON Take 45 mg by mouth at bedtime.   OCUSOFT LID SCRUB Pads Place 1 each into both eyes 2 (two) times daily.   ondansetron 8 MG disintegrating tablet Commonly known as:  ZOFRAN-ODT Take 1 tablet (8 mg total) by mouth every 8 (eight) hours as  needed for nausea or vomiting.   senna 8.6 MG Tabs tablet Commonly known as:  SENOKOT Take 1 tablet by mouth daily.   Vitamin D3 1000 units Caps Take 1,000 Units by mouth daily.      Follow-up Information    [facility]. Schedule an appointment as soon as possible for a visit in 4 week(s).   Specialty:  Radiology Contact information: [address] [place], [place], [place]. Schedule an appointment as soon as possible for a visit in 2 week(s).   Specialty:  Internal Medicine Contact information: [address] [place] [address] [phone]          Allergies  Allergen Reactions  . Amlodipine Besylate     REACTION: itching  . Azatadine     Per MAR?  [name] Kitchen Azathioprine     REACTION: nausea  . Penicillins     REACTION: yeast infection Per MAR   . Sertraline Hcl     REACTION: rash  . Sulfonamide Derivatives     REACTION: swelling and itching    Consultations:  Neurology  Procedures/Studies: Ct Angio Head W Or Wo Contrast  Result Date: 09/16/2017 CLINICAL DATA:  Focal neuro deficit with stroke suspected EXAM: CT ANGIOGRAPHY HEAD AND NECK TECHNIQUE: Multidetector CT imaging of the head and neck was performed using the standard protocol during bolus administration of intravenous contrast. Multiplanar CT image reconstructions and MIPs were obtained to evaluate the vascular anatomy. Carotid stenosis measurements (when applicable) are obtained utilizing NASCET criteria, using the distal internal carotid diameter as the denominator. CONTRAST:  6m ISOVUE-370 IOPAMIDOL (ISOVUE-370) INJECTION 76% COMPARISON:  Noncontrast head CT earlier today FINDINGS: CTA NECK FINDINGS Aortic arch: Atherosclerotic calcification. The brachiocephalic origin is not covered. Three vessel branching pattern. Right carotid system: No stenosis, beading, or significant atheromatous change. Left carotid system: Minor atheromatous changes at the bifurcation. Vessels are smooth and widely patent. Vertebral arteries: Calcified plaque at the proximal subclavian on the left. Focal narrowing of the distal left V2 segment measuring 40%. No dissection or flow limiting stenosis. Skeleton: Degenerative changes without acute or aggressive finding Other neck: No evidence of mass or inflammation Upper chest: Layering pleural effusions partially seen on both sides. There is chronic biapical pleural based scarring. Small subpleural opacity in the right upper lobe, indistinct and likely inflammatory. Review of the MIP images confirms the above findings CTA HEAD FINDINGS Anterior circulation: Mild atherosclerotic plaque on the carotid siphons. No branch occlusion or major vessel flow limiting stenosis. There is atherosclerotic irregularity of the left M3 branch of note given the noncontrast  CT findings. Negative for aneurysm. Posterior circulation: Small vertebral and basilar arteries in the setting of fetal type bilateral PCA. No flow limiting stenosis or branch occlusion. Venous sinuses: Patent Anatomic variants: None significant Delayed phase: Not obtained in the emergent setting Review of the MIP images confirms the above findings IMPRESSION: 1. No large vessel occlusion. 2. Overall mild for age atherosclerosis without flow limiting stenosis in the major vessels. 3. Focal atherosclerotic irregularity of a left M3 branch of note given the preceding head CT findings. 4. There are layering pleural effusions. Small focus of airspace disease in the right upper lobe that is indistinct and likely inflammatory. Electronically Signed   By: JMonte FantasiaM.D.   On: 09/16/2017 13:17   Ct Angio Neck W Or Wo Contrast  Result Date: 09/16/2017 CLINICAL DATA:  Focal  neuro deficit with stroke suspected EXAM: CT ANGIOGRAPHY HEAD AND NECK TECHNIQUE: Multidetector CT imaging of the head and neck was performed using the standard protocol during bolus administration of intravenous contrast. Multiplanar CT image reconstructions and MIPs were obtained to evaluate the vascular anatomy. Carotid stenosis measurements (when applicable) are obtained utilizing NASCET criteria, using the distal internal carotid diameter as the denominator. CONTRAST:  63m ISOVUE-370 IOPAMIDOL (ISOVUE-370) INJECTION 76% COMPARISON:  Noncontrast head CT earlier today FINDINGS: CTA NECK FINDINGS Aortic arch: Atherosclerotic calcification. The brachiocephalic origin is not covered. Three vessel branching pattern. Right carotid system: No stenosis, beading, or significant atheromatous change. Left carotid system: Minor atheromatous changes at the bifurcation. Vessels are smooth and widely patent. Vertebral arteries: Calcified plaque at the proximal subclavian on the left. Focal narrowing of the distal left V2 segment measuring 40%. No  dissection or flow limiting stenosis. Skeleton: Degenerative changes without acute or aggressive finding Other neck: No evidence of mass or inflammation Upper chest: Layering pleural effusions partially seen on both sides. There is chronic biapical pleural based scarring. Small subpleural opacity in the right upper lobe, indistinct and likely inflammatory. Review of the MIP images confirms the above findings CTA HEAD FINDINGS Anterior circulation: Mild atherosclerotic plaque on the carotid siphons. No branch occlusion or major vessel flow limiting stenosis. There is atherosclerotic irregularity of the left M3 branch of note given the noncontrast CT findings. Negative for aneurysm. Posterior circulation: Small vertebral and basilar arteries in the setting of fetal type bilateral PCA. No flow limiting stenosis or branch occlusion. Venous sinuses: Patent Anatomic variants: None significant Delayed phase: Not obtained in the emergent setting Review of the MIP images confirms the above findings IMPRESSION: 1. No large vessel occlusion. 2. Overall mild for age atherosclerosis without flow limiting stenosis in the major vessels. 3. Focal atherosclerotic irregularity of a left M3 branch of note given the preceding head CT findings. 4. There are layering pleural effusions. Small focus of airspace disease in the right upper lobe that is indistinct and likely inflammatory. Electronically Signed   By: JMonte FantasiaM.D.   On: 09/16/2017 13:17   Mr Brain Wo Contrast  Result Date: 09/16/2017 CLINICAL DATA:  Right facial droop and aphasia. EXAM: MRI HEAD WITHOUT CONTRAST TECHNIQUE: Multiplanar, multiecho pulse sequences of the brain and surrounding structures were obtained without intravenous contrast. COMPARISON:  Head CT 09/16/2017 FINDINGS: BRAIN: Multifocal acute ischemia within the left hemisphere, including the left frontal white matter, base of the left precentral gyrus and posterior left insula. There is an  additional focus in the posterior left temporal lobe. There is also a focus of hyperintensity on diffusion-weighted imaging in the right cerebellar hemisphere without corresponding abnormality on the ADC map. The midline structures are normal. There are old bilateral cerebellar infarcts. Diffuse confluent hyperintense T2-weighted signal within the periventricular, deep and juxtacortical white matter, most commonly due to chronic ischemic microangiopathy. Generalized atrophy without lobar predilection. Susceptibility-sensitive sequences show no chronic microhemorrhage or superficial siderosis. VASCULAR: Major intracranial arterial and venous sinus flow voids are preserved. SKULL AND UPPER CERVICAL SPINE: The visualized skull base, calvarium, upper cervical spine and extracranial soft tissues are normal. SINUSES/ORBITS: Left mastoid effusion. Paranasal sinuses are clear. There are bilateral lens replacements. IMPRESSION: 1. Multiple foci of acute ischemia within the left hemisphere with the largest lesions located in the left frontal lobe. No hemorrhage or mass effect. 2. Punctate subacute right cerebellar infarct suspected on the basis of DWI hyperintensity without corresponding decrease of apparent diffusion coefficient.  3. Multiple old cerebellar infarcts and advanced chronic ischemic microangiopathy. Electronically Signed   By: Ulyses Jarred M.D.   On: 09/16/2017 14:23   Dg Chest Port 1 View  Result Date: 09/20/2017 CLINICAL DATA:  Aspiration pneumonia EXAM: PORTABLE CHEST 1 VIEW COMPARISON:  09/16/2017 FINDINGS: Cardiomediastinal silhouette is unchanged. Small bilateral pleural effusions, LEFT-greater-than-RIGHT, LEFT LOWER lung consolidation/atelectasis and mild RIGHT basilar airspace disease/atelectasis are unchanged. There is no evidence of pneumothorax. No other changes are noted. IMPRESSION: Unchanged appearance of the chest with small bilateral pleural effusions, LEFT-greater-than-RIGHT, LEFT LOWER lung  consolidation/atelectasis and mild RIGHT basilar airspace disease/atelectasis. Electronically Signed   By: Margarette Canada M.D.   On: 09/20/2017 14:20   Dg Chest Portable 1 View  Result Date: 09/16/2017 CLINICAL DATA:  Initial evaluation for acute shortness of breath. EXAM: PORTABLE CHEST 1 VIEW COMPARISON:  Prior CT from 11/30/2016 FINDINGS: Mild cardiomegaly, stable. Mediastinal silhouette within normal limits. Tortuosity the intrathoracic aorta noted. Lungs mildly hypoinflated. Moderate layering left pleural effusion. Associated left basilar opacity may reflect atelectasis or infiltrate. Perihilar vascular congestion without overt pulmonary edema. No pneumothorax. No acute osseus abnormality. Diffuse osteopenia. Prior ORIF at the proximal left humerus. IMPRESSION: 1. Moderate layering left pleural effusion. Associated left basilar opacity likely atelectasis, although infiltrate could be considered in the correct clinical setting. 2. Cardiomegaly with mild diffuse pulmonary vascular congestion. Electronically Signed   By: Jeannine Boga M.D.   On: 09/16/2017 13:19   Dg Swallowing Func-speech Pathology  Result Date: 09/18/2017 Objective Swallowing Evaluation: Type of Study: MBS-Modified Barium Swallow Study  Patient Details Name: SUGEY TREVATHAN MRN: [id number] Date of Birth: 09-May-1924 Today's Date: 09/18/2017 Time: SLP Start Time (ACUTE ONLY): 1155 -SLP Stop Time (ACUTE ONLY): 1215 SLP Time Calculation (min) (ACUTE ONLY): 20 min Past Medical History: Past Medical History: Diagnosis Date . ALLERGIC RHINITIS CAUSE UNSPECIFIED  . ANXIETY  . Autoimmune hepatitis ([place])   on MP6, prev pred . DEPRESSION, MAJOR, MODERATE  . Headache(784.0) 02/15/2010 . HYPERTENSION, BENIGN ESSENTIAL  . HYPOTHYROIDISM  . OSTEOPOROSIS   fx L prox hum and L distal radius, both requiring ORIF 02/2013 . Unspecified vitamin D deficiency  Past Surgical History: Past Surgical History: Procedure Laterality Date . CATARACT EXTRACTION  2008   Left eye . CATARACT EXTRACTION  2009  Right eye . HUMERUS IM NAIL Left 02/2013  DR Rosaland Lao . HUMERUS IM NAIL Left 02/24/2013  Procedure: INTRAMEDULLARY (IM) NAIL LEFT PROXIMAL  HUMERUS;  Surgeon: Nita Sells, MD;  Location: [place];  Service: Orthopedics;  Laterality: Left; . IR KYPHO LUMBAR INC FX REDUCE BONE BX UNI/BIL CANNULATION INC/IMAGING  07/28/2017 . OPEN REDUCTION INTERNAL FIXATION (ORIF) DISTAL RADIAL FRACTURE Left 02/16/2013  Procedure: OPEN REDUCTION INTERNAL FIXATION (ORIF) DISTAL RADIAL FRACTURE;  Surgeon: Linna Hoff, MD;  Location: [place];  Service: Orthopedics;  Laterality: Left; . ORIF RADIAL FRACTURE Right 03/01/2016  Procedure: OPEN REDUCTION INTERNAL FIXATION (ORIF) RADIUS FRACTURE;  Surgeon: Roseanne Kaufman, MD;  Location: [place];  Service: Orthopedics;  Laterality: Right; . TONSILLECTOMY  1945 HPI: 84 yo female who resides in memory unit per report of RN admitted with right facial droop and aphasia.  Pt found to have acute cva - left frontal ischemia and old cerebellar infarcts.   Subjective: Pt seen in radiology for MBS to objectively assess swallow function and safety, and identify least restrictive diet. Assessment / Plan / Recommendation CHL IP CLINICAL IMPRESSIONS 09/18/2017 Clinical Impression Pt presents with moderate oral, mild pharyngeal dysphagia, characterized as follows:  ORALLY, pt exhibits significant xerostomia. Poor labial seal is apparent, with right anterior leakage of most po trials. Pt has difficulty with bolus formation and posterior propulsion, and exhibits diffuse residue throughout the oral cavity after the swallow, which is largely piecemeal. Pt was unable to chew mechanical soft solids, so this consistency was removed from the oral cavity. Following the study, pt was noted to have continued right anterior leakage due to diffuse residue. PHARYNGEALLY, pt exhibits slightly delayed swallow reflex with reduced epiglottic inversion intermittently. Diffuse tongue base  and vallecular residue noted after the swallow, which clears with dry swallows. There was no penetration or aspiration of any consistency, however, pt is at increased risk due to oropharyngeal residue. Thorough oral care is strongly encouraged, both before and after meals to decrease bacterial load, and minimize aspiration risk. Crush meds in puree. Safe swallow precautions sent back to pt room with transport. SLP will follow for diet tolerance assessment and education.  SLP Visit Diagnosis Dysphagia, oropharyngeal phase (R13.12) Impact on safety and function Mild aspiration risk;Moderate aspiration risk;Risk for inadequate nutrition/hydration   CHL IP TREATMENT RECOMMENDATION 09/18/2017 Treatment Recommendations Therapy as outlined in treatment plan below   Prognosis 09/18/2017 Prognosis for Safe Diet Advancement Guarded Barriers to Reach Goals Severity of deficits;Cognitive deficits CHL IP DIET RECOMMENDATION 09/18/2017 SLP Diet Recommendations Dysphagia 1 (Puree) solids;Thin liquid Liquid Administration via Straw Medication Administration Crushed with puree Compensations Minimize environmental distractions;Slow rate;Small sips/bites;Lingual sweep for clearance of pocketing;Follow solids with liquid Postural Changes Position upright at 90 degrees Remain at 45 degrees for 30 minutes after meals    CHL IP OTHER RECOMMENDATIONS 09/18/2017 Oral Care Recommendations Oral care before and after PO Other Recommendations Have oral suction available   CHL IP FOLLOW UP RECOMMENDATIONS 09/18/2017 Follow up Recommendations Skilled Nursing facility;24 hour supervision/assistance   CHL IP FREQUENCY AND DURATION 09/18/2017 Speech Therapy Frequency (ACUTE ONLY) min 2x/week Treatment Duration 1 week;2 weeks      CHL IP ORAL PHASE 09/18/2017 Oral Phase Impaired Oral - Nectar Straw Right anterior bolus loss;Weak lingual manipulation;Right pocketing in lateral sulci;Reduced posterior propulsion;Lingual/palatal residue;Premature  spillage;Delayed oral transit;Piecemeal swallowing;Holding of bolus;Decreased bolus cohesion Oral - Thin Straw Right anterior bolus loss;Weak lingual manipulation;Right pocketing in lateral sulci;Reduced posterior propulsion;Lingual/palatal residue;Premature spillage;Delayed oral transit;Piecemeal swallowing;Holding of bolus;Decreased bolus cohesion Oral - Puree Right pocketing in lateral sulci;Reduced posterior propulsion;Lingual/palatal residue;Premature spillage;Delayed oral transit;Piecemeal swallowing;Holding of bolus;Decreased bolus cohesion Oral - Mech Soft Pt unable to chew cracker, so it was removed from oral cavity.  CHL IP PHARYNGEAL PHASE 09/18/2017 Pharyngeal Phase Impaired Pharyngeal- Nectar Straw Delayed swallow initiation-vallecula;Reduced epiglottic inversion;Reduced tongue base retraction;Pharyngeal residue - valleculae;Pharyngeal residue - posterior pharnyx Pharyngeal- Thin Straw Delayed swallow initiation-vallecula;Reduced epiglottic inversion;Reduced tongue base retraction;Pharyngeal residue - valleculae;Pharyngeal residue - posterior pharnyx Pharyngeal- Puree Delayed swallow initiation-vallecula;Reduced epiglottic inversion;Reduced tongue base retraction;Pharyngeal residue - valleculae;Pharyngeal residue - posterior pharnyx  CHL IP CERVICAL ESOPHAGEAL PHASE 09/18/2017 Cervical Esophageal Phase [facility] Celia B. Quentin Ore [facility], CCC-SLP Speech Language Pathologist [phone] Shonna Chock 09/18/2017, 12:39 PM              Ct Head Code Stroke Wo Contrast  Result Date: 09/16/2017 CLINICAL DATA:  Code stroke. Unexplained altered level of consciousness. Right-sided facial paralysis EXAM: CT HEAD WITHOUT CONTRAST TECHNIQUE: Contiguous axial images were obtained from the base of the skull through the vertex without intravenous contrast. COMPARISON:  02/05/2016 FINDINGS: Brain: Small area of age-indeterminate infarction in the high left frontal cortex. Small area of age-indeterminate infarction in the  right superior cerebellum (just above a smaller and chronic infarct in the right cerebellum seen previously). Small remote left cerebellar infarct. Confluent chronic small vessel ischemic gliosis in the cerebral white matter. No acute hemorrhage, hydrocephalus, or masslike finding Vascular: No high-density vessel.  Atherosclerotic calcification. Skull: Negative Sinuses/Orbits: Bilateral cataract resection Other: These results were called by telephone at the time of interpretation on 09/16/2017 at 12:46 pm to Dr. Rory Percy , who was already aware of findings. ASPECTS [facility] Stroke Program Early CT Score) - Ganglionic level infarction (caudate, lentiform nuclei, internal capsule, insula, M1-M3 cortex): 6 - Supraganglionic infarction (M4-M6 cortex): 3 Total score (0-10 with 10 being normal): 9 IMPRESSION: 1. Small age-indeterminate infarct in the left frontal cortex. ASPECTS is 9. 2. Small age-indeterminate right superior cerebellar infarct. 3. Chronic small vessel ischemia. Electronically Signed   By: Monte Fantasia M.D.   On: 09/16/2017 12:49    Subjective: Without complaints  Discharge Exam: Vitals:   09/21/17 0011 09/21/17 0431  BP: 134/90 126/67  Pulse:  67  Resp: (!) 22 (!) 22  Temp: 98.2 F (36.8 C) 98.1 F (36.7 C)  SpO2:  100%   Vitals:   09/20/17 1916 09/20/17 1917 09/21/17 0011 09/21/17 0431  BP: 131/90  134/90 126/67  Pulse: 86   67  Resp: (!) 22  (!) 22 (!) 22  Temp: 98.3 F (36.8 C) 98.3 F (36.8 C) 98.2 F (36.8 C) 98.1 F (36.7 C)  TempSrc: Oral  Oral Axillary  SpO2: 96%   100%  Weight:      Height:        General: Pt is alert, awake, not in acute distress Cardiovascular: RRR, S1/S2 +, no rubs, no gallops Respiratory: CTA bilaterally, no wheezing, no rhonchi Abdominal: Soft, NT, ND, bowel sounds + Extremities: no edema, no cyanosis   The results of significant diagnostics from this hospitalization (including imaging, microbiology, ancillary and laboratory) are  listed below for reference.     Microbiology: No results found for this or any previous visit (from the past 240 hour(s)).   Labs: BNP (last 3 results) No results for input(s): BNP in the last 8760 hours. Basic Metabolic Panel: Recent Labs  Lab 09/16/17 1500 09/17/17 0218 09/18/17 0412 09/19/17 0451 09/20/17 0327 09/21/17 0821  NA  --  132* 133* 134* 135 137  K  --  3.3* 3.5 3.1* 3.4* 3.9  CL  --  99 101 106 109 108  CO2  --  22 18* 21* 19* 21*  GLUCOSE  --  91 69* 103* 117* 114*  BUN  --  _0 CREATININE  --  0.91 0.89 0.78 0.82 0.70  CALCIUM  --  8.3* 8.3* 8.1* 8.1* 8.5*  MG 2.0  --   --   --   --   --   PHOS 2.8  --   --   --   --   --    Liver Function Tests: Recent Labs  Lab 09/16/17 1233  AST 19  ALT 10  ALKPHOS 126  BILITOT 1.3*  PROT 7.0  ALBUMIN 3.2*   No results for input(s): LIPASE, AMYLASE in the last 168 hours. No results for input(s): AMMONIA in the last 168 hours. CBC: Recent Labs  Lab 09/16/17 1233 09/16/17 1240 09/18/17 0412 09/21/17 0430  WBC 9.2  --  7.7 7.0  NEUTROABS 6.6  --   --   --   HGB 13.3 13.9 12.2 12.5  HCT 39.6 41.0 37.4  38.3  MCV 99.2  --  100.8* 101.3*  PLT 264  --  240 212   Cardiac Enzymes: No results for input(s): CKTOTAL, CKMB, CKMBINDEX, TROPONINI in the last 168 hours. BNP: Invalid input(s): POCBNP CBG: Recent Labs  Lab 09/16/17 1233  GLUCAP 94   D-Dimer No results for input(s): DDIMER in the last 72 hours. Hgb A1c No results for input(s): HGBA1C in the last 72 hours. Lipid Profile No results for input(s): CHOL, HDL, LDLCALC, TRIG, CHOLHDL, LDLDIRECT in the last 72 hours. Thyroid function studies No results for input(s): TSH, T4TOTAL, T3FREE, THYROIDAB in the last 72 hours.  Invalid input(s): FREET3 Anemia work up No results for input(s): VITAMINB12, FOLATE, FERRITIN, TIBC, IRON, RETICCTPCT in the last 72 hours. Urinalysis    Component Value Date/Time   COLORURINE YELLOW 07/27/2017 1546    APPEARANCEUR CLEAR 07/27/2017 1546   LABSPEC 1.005 07/27/2017 1546   PHURINE 7.0 07/27/2017 1546   GLUCOSEU NEGATIVE 07/27/2017 1546   HGBUR NEGATIVE 07/27/2017 1546   BILIRUBINUR NEGATIVE 07/27/2017 1546   KETONESUR NEGATIVE 07/27/2017 1546   PROTEINUR NEGATIVE 07/27/2017 1546   UROBILINOGEN 1.0 02/16/2013 1156   NITRITE NEGATIVE 07/27/2017 1546   LEUKOCYTESUR TRACE (A) 07/27/2017 1546   Sepsis Labs Invalid input(s): PROCALCITONIN,  WBC,  LACTICIDVEN Microbiology No results found for this or any previous visit (from the past 240 hour(s)).  Time spent: 37mn  SIGNED:   SMarylu Lund MD  [facility] 09/21/2017, 12:51 PM  If 7PM-7AM, please contact night-coverage [email or web address] Password [email or web address]

## 2017-09-21 NOTE — Progress Notes (Signed)
CSW following for discharge plan. Patient's authorization for SNF ran out yesterday; will need a new auth request. CSW faxed information over, waiting on updated PT note. CSW paged PT for updates, will fax in when note is entered.  CSW to follow.  Blenda NicelyElizabeth Kirklin Mcduffee, [place] Clinical Social Worker [phone]

## 2017-09-21 NOTE — Progress Notes (Signed)
  Speech Language Pathology Treatment: Dysphagia  Patient Details Name: Marisa Smith MRN: [id number] DOB: 1924/12/16 Today's Date: 09/21/2017 Time: [id number] SLP Time Calculation (min) (ACUTE ONLY): 25 min  Assessment / Plan / Recommendation Clinical Impression  Pt with improved interactions today, improved attention to breakfast meal.  Able to use LUE to hold cup/drink and bring to mouth.  No overt s/s of aspiration.   Mod verbal cues to attend to right oral cavity; significant delays in mastication/oral preparation. Pt requires a great deal of time to eat a meal due to inattention and fatigue.  Consumption appeared to generally be safe, requiring assist with oral care after meal to remove residue.  Primary barrier will continue to be adequacy of intake.  SLP will follow - no family available for education.    HPI HPI: 84 yo female who resides in memory unit per report of RN admitted with right facial droop and aphasia.  Pt found to have acute cva - left frontal ischemia and old cerebellar infarcts.        SLP Plan  Continue with current plan of care       Recommendations  Diet recommendations: Dysphagia 1 (puree);Thin liquid Liquids provided via: Cup;Straw Medication Administration: Crushed with puree Supervision: Staff to assist with self feeding Compensations: Minimize environmental distractions;Slow rate;Small sips/bites;Lingual sweep for clearance of pocketing;Follow solids with liquid Postural Changes and/or Swallow Maneuvers: Seated upright 90 degrees;Upright 30-60 min after meal                Oral Care Recommendations: Oral care BID Follow up Recommendations: Skilled Nursing facility;24 hour supervision/assistance SLP Visit Diagnosis: Dysphagia, oropharyngeal phase (R13.12) Plan: Continue with current plan of care       GO                Blenda MountsCouture, Traeson Dusza Laurice 09/21/2017, 9:41 AM

## 2017-09-21 NOTE — Progress Notes (Signed)
Discharge to: [facility] Anticipated discharge date: 09/21/17 Family notified: Yes, daughter, by phone Transportation by: [facility]  Report #: [phone], Room 503A  CSW signing off.  Blenda Nicelylizabeth Cella Cappello LCSW [phone]

## 2017-09-22 DIAGNOSIS — M6281 Muscle weakness (generalized): Secondary | ICD-10-CM | POA: Diagnosis not present

## 2017-09-22 DIAGNOSIS — I4891 Unspecified atrial fibrillation: Secondary | ICD-10-CM | POA: Diagnosis not present

## 2017-09-22 DIAGNOSIS — I10 Essential (primary) hypertension: Secondary | ICD-10-CM | POA: Diagnosis not present

## 2017-09-22 DIAGNOSIS — I639 Cerebral infarction, unspecified: Secondary | ICD-10-CM | POA: Diagnosis not present

## 2017-09-24 DIAGNOSIS — N179 Acute kidney failure, unspecified: Secondary | ICD-10-CM | POA: Diagnosis not present

## 2017-09-24 DIAGNOSIS — F330 Major depressive disorder, recurrent, mild: Secondary | ICD-10-CM | POA: Diagnosis not present

## 2017-09-24 DIAGNOSIS — R52 Pain, unspecified: Secondary | ICD-10-CM | POA: Diagnosis not present

## 2017-09-24 DIAGNOSIS — I639 Cerebral infarction, unspecified: Secondary | ICD-10-CM | POA: Diagnosis not present

## 2017-09-24 DIAGNOSIS — M6281 Muscle weakness (generalized): Secondary | ICD-10-CM | POA: Diagnosis not present

## 2017-09-24 DIAGNOSIS — F419 Anxiety disorder, unspecified: Secondary | ICD-10-CM | POA: Diagnosis not present

## 2017-09-28 DIAGNOSIS — N179 Acute kidney failure, unspecified: Secondary | ICD-10-CM | POA: Diagnosis not present

## 2017-09-28 DIAGNOSIS — I639 Cerebral infarction, unspecified: Secondary | ICD-10-CM | POA: Diagnosis not present

## 2017-09-28 DIAGNOSIS — I10 Essential (primary) hypertension: Secondary | ICD-10-CM | POA: Diagnosis not present

## 2017-09-28 DIAGNOSIS — M6281 Muscle weakness (generalized): Secondary | ICD-10-CM | POA: Diagnosis not present

## 2017-09-30 DIAGNOSIS — I639 Cerebral infarction, unspecified: Secondary | ICD-10-CM | POA: Diagnosis not present

## 2017-09-30 DIAGNOSIS — F330 Major depressive disorder, recurrent, mild: Secondary | ICD-10-CM | POA: Diagnosis not present

## 2017-09-30 DIAGNOSIS — I4891 Unspecified atrial fibrillation: Secondary | ICD-10-CM | POA: Diagnosis not present

## 2017-09-30 DIAGNOSIS — N179 Acute kidney failure, unspecified: Secondary | ICD-10-CM | POA: Diagnosis not present

## 2017-09-30 DIAGNOSIS — G3184 Mild cognitive impairment, so stated: Secondary | ICD-10-CM | POA: Diagnosis not present

## 2017-09-30 DIAGNOSIS — F419 Anxiety disorder, unspecified: Secondary | ICD-10-CM | POA: Diagnosis not present

## 2017-09-30 DIAGNOSIS — M6281 Muscle weakness (generalized): Secondary | ICD-10-CM | POA: Diagnosis not present

## 2017-10-01 DIAGNOSIS — F419 Anxiety disorder, unspecified: Secondary | ICD-10-CM | POA: Diagnosis not present

## 2017-10-01 DIAGNOSIS — F330 Major depressive disorder, recurrent, mild: Secondary | ICD-10-CM | POA: Diagnosis not present

## 2017-10-07 DIAGNOSIS — I4891 Unspecified atrial fibrillation: Secondary | ICD-10-CM | POA: Diagnosis not present

## 2017-10-07 DIAGNOSIS — I639 Cerebral infarction, unspecified: Secondary | ICD-10-CM | POA: Diagnosis not present

## 2017-10-07 DIAGNOSIS — N179 Acute kidney failure, unspecified: Secondary | ICD-10-CM | POA: Diagnosis not present

## 2017-10-07 DIAGNOSIS — M6281 Muscle weakness (generalized): Secondary | ICD-10-CM | POA: Diagnosis not present

## 2017-10-09 DIAGNOSIS — G8929 Other chronic pain: Secondary | ICD-10-CM | POA: Diagnosis not present

## 2017-10-09 DIAGNOSIS — R159 Full incontinence of feces: Secondary | ICD-10-CM | POA: Diagnosis not present

## 2017-10-09 DIAGNOSIS — M623 Immobility syndrome (paraplegic): Secondary | ICD-10-CM | POA: Diagnosis not present

## 2017-10-09 DIAGNOSIS — M7981 Nontraumatic hematoma of soft tissue: Secondary | ICD-10-CM | POA: Diagnosis not present

## 2017-10-14 DIAGNOSIS — L603 Nail dystrophy: Secondary | ICD-10-CM | POA: Diagnosis not present

## 2017-10-14 DIAGNOSIS — Q845 Enlarged and hypertrophic nails: Secondary | ICD-10-CM | POA: Diagnosis not present

## 2017-10-14 DIAGNOSIS — I739 Peripheral vascular disease, unspecified: Secondary | ICD-10-CM | POA: Diagnosis not present

## 2017-10-15 DIAGNOSIS — F419 Anxiety disorder, unspecified: Secondary | ICD-10-CM | POA: Diagnosis not present

## 2017-10-15 DIAGNOSIS — F330 Major depressive disorder, recurrent, mild: Secondary | ICD-10-CM | POA: Diagnosis not present

## 2017-10-22 DIAGNOSIS — Z79899 Other long term (current) drug therapy: Secondary | ICD-10-CM | POA: Diagnosis not present

## 2017-10-22 DIAGNOSIS — N390 Urinary tract infection, site not specified: Secondary | ICD-10-CM | POA: Diagnosis not present

## 2017-10-22 DIAGNOSIS — R319 Hematuria, unspecified: Secondary | ICD-10-CM | POA: Diagnosis not present

## 2017-10-27 ENCOUNTER — Encounter: Payer: Self-pay | Admitting: Adult Health

## 2017-10-27 ENCOUNTER — Ambulatory Visit (INDEPENDENT_AMBULATORY_CARE_PROVIDER_SITE_OTHER): Payer: Medicare Other | Admitting: Adult Health

## 2017-10-27 VITALS — BP 108/70 | HR 77 | Ht 66.0 in

## 2017-10-27 DIAGNOSIS — E785 Hyperlipidemia, unspecified: Secondary | ICD-10-CM

## 2017-10-27 DIAGNOSIS — I63412 Cerebral infarction due to embolism of left middle cerebral artery: Secondary | ICD-10-CM | POA: Diagnosis not present

## 2017-10-27 DIAGNOSIS — I480 Paroxysmal atrial fibrillation: Secondary | ICD-10-CM | POA: Diagnosis not present

## 2017-10-27 DIAGNOSIS — I10 Essential (primary) hypertension: Secondary | ICD-10-CM

## 2017-10-27 NOTE — Patient Instructions (Addendum)
Continue Eliquis (apixaban) daily  and lipitor  for secondary stroke prevention  Stop aspirin at this time as no indication to continue  Continue to follow up with PCP regarding cholesterol and blood pressure management   Referral placed to see cardiologist for atrial fibrillation and eliquis management  Recommend to do PT/OT/ST if has not done so at this time  Encourage to stay active and maintain a healthy diet  Continue to monitor blood pressure at home  Maintain strict control of hypertension with blood pressure goal below 130/90, diabetes with hemoglobin A1c goal below 6.5% and cholesterol with LDL cholesterol (bad cholesterol) goal below 70 mg/dL. I also advised the patient to eat a healthy diet with plenty of whole grains, cereals, fruits and vegetables, exercise regularly and maintain ideal body weight.  Followup in the future with me in 3 months or call earlier if needed       [facility]. I hope we have been able to provide you high quality care today.  You may receive a patient satisfaction survey over the next few weeks. We would appreciate your feedback and comments so that we may continue to improve ourselves and the health of our patients.

## 2017-10-27 NOTE — Progress Notes (Signed)
[facility] [address] [place].  [address] [phone]       OFFICE FOLLOW UP NOTE  Ms. Raymon Mutton Date of Birth:  1924/08/23 Medical Record Number:  [id number]   Reason for Referral:  hospital stroke follow up  CHIEF COMPLAINT:  Chief Complaint  Patient presents with  . Follow-up    Hospital follow up  for stroke at [facility]  pt with CNA at facility [facility] no family present, pt in wheelchair     HPI: Marisa Smith is being seen today for initial visit in the office for right cerebellum and left MCA scattered infarcts on 09/16/17. History obtained from patient and chart review. Reviewed all radiology images and labs personally.  Robin Petrakis is a 84 year old female with history of anxiety, depression, autoimmune hepatitis, hypertension, hypothyroidism, atrial fibrillation not on anticoagulation, nursing home resident and recent L1 compression fracture status post kyphoplasty who was admitted for right facial droop and aphasia. CT showed right cerebellum infarct. CTA head and neck unremarkable except left M3 atherosclerosis. MRI showed right cerebellum and left MCA scattered infarcts. 2D echo showed anEF of 55 to 60%. LDL 94 and A1c 6.0. In ER, EKG showed A. fib RVR. Patient was previously on aspirin 81mg  and recommended to start eliquis 2.5mg  BID for management and secondary stroke prevention. LDL 94 and recommended lipitor 40mg . Therapies recommended discharge to SNF for continued therapies.   Patient is being seen today for hospital follow-up and is accompanied by SNF transport aide.  She continues to experience dysarthria along with generalized weakness but she does feel as though this has been improving.  She denies participating in therapy since discharge and per facility information that was sent over the patient, no evidence of prior therapy sessions.  She has continued to take Eliquis for atrial fibrillation along with aspirin 81 mg but denies  bleeding or bruising.  Continues to take Lipitor without side effects of myalgias.  Blood pressure today satisfactory 108/70.  She is currently sitting in a wheelchair as she has a difficult time ambulating.  Denies new or worsening stroke/TIA symptoms.   ROS:   14 system review of systems performed and negative with exception of speech difficulty and weakness  PMH:  Past Medical History:  Diagnosis Date  . ALLERGIC RHINITIS CAUSE UNSPECIFIED   . ANXIETY   . Autoimmune hepatitis (HCC)    on MP6, prev pred  . DEPRESSION, MAJOR, MODERATE   . Headache(784.0) 02/15/2010  . HYPERTENSION, BENIGN ESSENTIAL   . HYPOTHYROIDISM   . OSTEOPOROSIS    fx L prox hum and L distal radius, both requiring ORIF 02/2013  . Unspecified vitamin D deficiency     PSH:  Past Surgical History:  Procedure Laterality Date  . CATARACT EXTRACTION  2008   Left eye  . CATARACT EXTRACTION  2009   Right eye  . HUMERUS IM NAIL Left 02/2013   DR Sol Blazing  . HUMERUS IM NAIL Left 02/24/2013   Procedure: INTRAMEDULLARY (IM) NAIL LEFT PROXIMAL  HUMERUS;  Surgeon: Mable Paris, MD;  Location: [facility] OR;  Service: Orthopedics;  Laterality: Left;  . IR KYPHO LUMBAR INC FX REDUCE BONE BX UNI/BIL CANNULATION INC/IMAGING  07/28/2017  . OPEN REDUCTION INTERNAL FIXATION (ORIF) DISTAL RADIAL FRACTURE Left 02/16/2013   Procedure: OPEN REDUCTION INTERNAL FIXATION (ORIF) DISTAL RADIAL FRACTURE;  Surgeon: Sharma Covert, MD;  Location: [facility] OR;  Service: Orthopedics;  Laterality: Left;  . ORIF RADIAL FRACTURE Right 03/01/2016   Procedure: OPEN  REDUCTION INTERNAL FIXATION (ORIF) RADIUS FRACTURE;  Surgeon: Dominica Severin, MD;  Location: [facility] OR;  Service: Orthopedics;  Laterality: Right;  . TONSILLECTOMY  1945    Social History:  Social History   Socioeconomic History  . Marital status: Widowed    Spouse name: Not on file  . Number of children: 1  . Years of education: Not on file  . Highest education level: Not on file    Occupational History    Employer: RETIRED  Social Needs  . Financial resource strain: Not on file  . Food insecurity:    Worry: Not on file    Inability: Not on file  . Transportation needs:    Medical: Not on file    Non-medical: Not on file  Tobacco Use  . Smoking status: Never Smoker  . Smokeless tobacco: Never Used  . Tobacco comment: Married, Retired- daughter is Okey Regal Beaudin-Fifield responsible for majority of supervison for parents  Substance and Sexual Activity  . Alcohol use: No    Alcohol/week: 0.0 standard drinks  . Drug use: No  . Sexual activity: Not Currently  Lifestyle  . Physical activity:    Days per week: Not on file    Minutes per session: Not on file  . Stress: Not on file  Relationships  . Social connections:    Talks on phone: Not on file    Gets together: Not on file    Attends religious service: Not on file    Active member of club or organization: Not on file    Attends meetings of clubs or organizations: Not on file    Relationship status: Not on file  . Intimate partner violence:    Fear of current or ex partner: Not on file    Emotionally abused: Not on file    Physically abused: Not on file    Forced sexual activity: Not on file  Other Topics Concern  . Not on file  Social History Narrative  . Not on file    Family History:  Family History  Problem Relation Age of Onset  . Hypertension Mother   . Stroke Mother   . Heart disease Father   . Stroke Sister     Medications:   Current Outpatient Medications on File Prior to Visit  Medication Sig Dispense Refill  . acetaminophen (TYLENOL) 325 MG tablet Take 325 mg by mouth every 4 (four) hours as needed for mild pain (right hip pain).    [name] Kitchen acetaminophen (TYLENOL) 500 MG tablet Take 1,000 mg by mouth every 6 (six) hours as needed for mild pain or headache.     [name] Kitchen aspirin EC 81 MG EC tablet Take 81 mg by mouth daily.     . busPIRone (BUSPAR) 15 MG tablet Take 15 mg by mouth 2 (two) times  daily.   10  . Calcium Carbonate-Vitamin D 600-200 MG-UNIT CAPS Take 1 capsule by mouth daily.    . cetirizine (ZYRTEC) 10 MG tablet Take 10 mg by mouth daily.    . Cholecalciferol (VITAMIN D3) 1000 units CAPS Take 1,000 Units by mouth daily.     [name] Kitchen ELIQUIS 2.5 MG TABS tablet   98  . Eyelid Cleansers (OCUSOFT LID SCRUB) PADS Place 1 each into both eyes 2 (two) times daily.    . Glycerin-Hypromellose-PEG 400 0.2-0.36-1 % SOLN Place 1 drop into both eyes 3 (three) times daily.    [name] Kitchen HYDROcodone-acetaminophen (NORCO) 5-325 MG tablet Take 1 tablet by mouth every 6 (six) hours  as needed for moderate pain. 2 tablet 0  . lactulose (CHRONULAC) 10 GM/15ML solution Take 30 mLs by mouth 2 (two) times daily as needed.   0  . levothyroxine (SYNTHROID, LEVOTHROID) 50 MCG tablet Take 1 tablet (50 mcg total) by mouth daily before breakfast. 30 tablet 5  . LORazepam (ATIVAN) 0.5 MG tablet Take 1 tablet (0.5 mg total) by mouth 2 (two) times daily. 2 tablet 4  . Menthol, Topical Analgesic, (BIOFREEZE ROLL-ON) 4 % GEL Apply 1 application topically 3 (three) times daily.    . mercaptopurine (PURINETHOL) 50 MG tablet Take 0.5 tablets (25 mg total) by mouth daily. Give on an empty stomach 1 hour before or 2 hours after meals. Caution: Chemotherapy. 30 tablet 11  . mirtazapine (REMERON) 45 MG tablet Take 45 mg by mouth at bedtime.   10  . ondansetron (ZOFRAN ODT) 8 MG disintegrating tablet Take 1 tablet (8 mg total) by mouth every 8 (eight) hours as needed for nausea or vomiting. 10 tablet 0  . Propylene Glycol-Glycerin (ARTIFICIAL TEARS) 1-0.3 % SOLN Place 1 drop into both eyes 3 (three) times daily.    [name] Kitchen. senna (SENOKOT) 8.6 MG TABS tablet Take 1 tablet by mouth daily.    [name] Kitchen. apixaban (ELIQUIS) 2.5 MG TABS tablet Take 1 tablet (2.5 mg total) by mouth 2 (two) times daily. 60 tablet   . atorvastatin (LIPITOR) 40 MG tablet Take 1 tablet (40 mg total) by mouth daily at 6 PM. 30 tablet 0  . diltiazem (CARDIZEM) 90 MG tablet Take  1 tablet (90 mg total) by mouth every 6 (six) hours. 120 tablet 0  . metoprolol tartrate (LOPRESSOR) 25 MG tablet Take 0.5 tablets (12.5 mg total) by mouth 2 (two) times daily. 30 tablet 0   No current facility-administered medications on file prior to visit.     Allergies:   Allergies  Allergen Reactions  . Amlodipine Besylate     REACTION: itching  . Azatadine     Per MAR?  [name] Kitchen. Azathioprine     REACTION: nausea  . Penicillins     REACTION: yeast infection Per MAR  . Sertraline Hcl     REACTION: rash  . Sulfonamide Derivatives     REACTION: swelling and itching     Physical Exam  Vitals:   10/27/17 1338  BP: 108/70  Pulse: 77  Height: 5\' 6"  (1.676 m)   Body mass index is 18.04 kg/m. No exam data present  General: frail elderly pleasant caucasian female, seated, in no evident distress Head: head normocephalic and atraumatic.   Neck: supple with no carotid or supraclavicular bruits Cardiovascular: regular rate and rhythm, no murmurs Musculoskeletal: no deformity Skin:  no rash/petichiae Vascular:  Normal pulses all extremities  Neurologic Exam Mental Status: Awake and fully alert. Mild dysarthria present.Oriented to place and time. Recent and remote memory intact. Attention span, concentration and fund of knowledge appropriate. Flat affect.  Cranial Nerves: Fundoscopic exam reveals sharp disc margins. Pupils equal, briskly reactive to light. Extraocular movements full without nystagmus. Visual fields full to confrontation. Hearing intact. Facial sensation intact. Face, tongue, palate moves normally and symmetrically.  Motor: Normal bulk and tone. Generalized weakness throughout all extremities Sensory.: intact to touch , pinprick , position and vibratory sensation.  Coordination: Rapid alternating movements normal in all extremities. Finger-to-nose and heel-to-shin performed accurately bilaterally. Gait and Station: Patient currently sitting in w/c as she is unable to  ambulate therefore gait assessment deferred.  Reflexes: 1+ and symmetric. Toes downgoing.  NIHSS  1 Modified Rankin  4 HAS-BLED 2 CHA2DS2-VASc 6   Diagnostic Data (Labs, Imaging, Testing)  Ct Head Code Stroke Wo Contrast 09/16/2017 1. Small age-indeterminate infarct in the left frontal cortex. ASPECTS is 9. 2. Small age-indeterminate right superior cerebellar infarct. 3. Chronic small vessel ischemia.   Ct Angio Head W Or Wo Contrast Ct Angio Neck W Or Wo Contrast 09/16/2017 1. No large vessel occlusion. 2. Overall mild for age atherosclerosis without flow limiting stenosis in the major vessels. 3. Focal atherosclerotic irregularity of a left M3 branch of note given the preceding head CT findings. 4. There are layering pleural effusions. Small focus of airspace disease in the right upper lobe that is indistinct and likely inflammatory.   Mr Brain Wo Contrast 09/16/2017 1. Multiple foci of acute ischemia within the left hemisphere with the largest lesions located in the left frontal lobe. No hemorrhage or mass effect.  2. Punctate subacute right cerebellar infarct suspected on the basis of DWI hyperintensity without corresponding decrease of apparent diffusion coefficient.  3. Multiple old cerebellar infarcts and advanced chronic ischemic microangiopathy.      ASSESSMENT: Marisa Smith is a 84 y.o. year old female here with left MCA punctate and right cerebellar infarcts on 09/16/2017 secondary to known atrial fibrillation not on anticoagulation. Vascular risk factors include AF not on AC, HTN, and HLD.     PLAN: -Continue Eliquis (apixaban) daily  and Lipitor 40 mg for secondary stroke prevention -Stop aspirin 81 mg at this time as continuation is not indicated -F/u with PCP regarding your HLD and HTN management -Referral placed to follow-up with cardiology for atrial fibrillation and Eliquis management -Recommend participation with PT/OT/ST for continued deficits -continue to  monitor BP at home -advised to continue to stay active as tolerated and maintain a healthy diet -Maintain strict control of hypertension with blood pressure goal below 130/90, diabetes with hemoglobin A1c goal below 6.5% and cholesterol with LDL cholesterol (bad cholesterol) goal below 70 mg/dL. I also advised the patient to eat a healthy diet with plenty of whole grains, cereals, fruits and vegetables, exercise regularly and maintain ideal body weight.  Follow up in 3 months or call earlier if needed   Greater than 50% of time during this 25 minute visit was spent on counseling,explanation of diagnosis of left MCA punctate and right cerebellar infarcts, reviewing risk factor management of AF, HTN and HLD, planning of further management, discussion with patient and family and coordination of care    George Hugh, AGNP-BC  [facility] [address] [place], [place] [address]  Phone [phone] Fax [phone] Note: This document was prepared with digital dictation and possible smart phrase technology. Any transcriptional errors that result from this process are unintentional.

## 2017-10-29 DIAGNOSIS — F330 Major depressive disorder, recurrent, mild: Secondary | ICD-10-CM | POA: Diagnosis not present

## 2017-10-29 DIAGNOSIS — F419 Anxiety disorder, unspecified: Secondary | ICD-10-CM | POA: Diagnosis not present

## 2017-10-30 NOTE — Progress Notes (Signed)
I agree with the above plan 

## 2017-11-04 DIAGNOSIS — M6281 Muscle weakness (generalized): Secondary | ICD-10-CM | POA: Diagnosis not present

## 2017-11-04 DIAGNOSIS — I69320 Aphasia following cerebral infarction: Secondary | ICD-10-CM | POA: Diagnosis not present

## 2017-11-04 DIAGNOSIS — R1312 Dysphagia, oropharyngeal phase: Secondary | ICD-10-CM | POA: Diagnosis not present

## 2017-11-05 DIAGNOSIS — F330 Major depressive disorder, recurrent, mild: Secondary | ICD-10-CM | POA: Diagnosis not present

## 2017-11-05 DIAGNOSIS — F419 Anxiety disorder, unspecified: Secondary | ICD-10-CM | POA: Diagnosis not present

## 2017-11-06 DIAGNOSIS — M6281 Muscle weakness (generalized): Secondary | ICD-10-CM | POA: Diagnosis not present

## 2017-11-10 DIAGNOSIS — I639 Cerebral infarction, unspecified: Secondary | ICD-10-CM | POA: Diagnosis not present

## 2017-11-10 DIAGNOSIS — I10 Essential (primary) hypertension: Secondary | ICD-10-CM | POA: Diagnosis not present

## 2017-11-10 DIAGNOSIS — M6281 Muscle weakness (generalized): Secondary | ICD-10-CM | POA: Diagnosis not present

## 2017-11-10 DIAGNOSIS — I4891 Unspecified atrial fibrillation: Secondary | ICD-10-CM | POA: Diagnosis not present

## 2017-11-11 DIAGNOSIS — M6281 Muscle weakness (generalized): Secondary | ICD-10-CM | POA: Diagnosis not present

## 2017-11-12 DIAGNOSIS — F330 Major depressive disorder, recurrent, mild: Secondary | ICD-10-CM | POA: Diagnosis not present

## 2017-11-12 DIAGNOSIS — F419 Anxiety disorder, unspecified: Secondary | ICD-10-CM | POA: Diagnosis not present

## 2017-11-13 DIAGNOSIS — M6281 Muscle weakness (generalized): Secondary | ICD-10-CM | POA: Diagnosis not present

## 2017-11-17 DIAGNOSIS — M6281 Muscle weakness (generalized): Secondary | ICD-10-CM | POA: Diagnosis not present

## 2017-11-18 DIAGNOSIS — M6281 Muscle weakness (generalized): Secondary | ICD-10-CM | POA: Diagnosis not present

## 2017-11-20 DIAGNOSIS — M6281 Muscle weakness (generalized): Secondary | ICD-10-CM | POA: Diagnosis not present

## 2017-11-25 DIAGNOSIS — I69320 Aphasia following cerebral infarction: Secondary | ICD-10-CM | POA: Diagnosis not present

## 2017-11-25 DIAGNOSIS — M6281 Muscle weakness (generalized): Secondary | ICD-10-CM | POA: Diagnosis not present

## 2017-11-25 DIAGNOSIS — R1312 Dysphagia, oropharyngeal phase: Secondary | ICD-10-CM | POA: Diagnosis not present

## 2017-11-26 DIAGNOSIS — I69320 Aphasia following cerebral infarction: Secondary | ICD-10-CM | POA: Diagnosis not present

## 2017-11-26 DIAGNOSIS — R1312 Dysphagia, oropharyngeal phase: Secondary | ICD-10-CM | POA: Diagnosis not present

## 2017-11-26 DIAGNOSIS — F330 Major depressive disorder, recurrent, mild: Secondary | ICD-10-CM | POA: Diagnosis not present

## 2017-11-26 DIAGNOSIS — F419 Anxiety disorder, unspecified: Secondary | ICD-10-CM | POA: Diagnosis not present

## 2017-11-26 DIAGNOSIS — M6281 Muscle weakness (generalized): Secondary | ICD-10-CM | POA: Diagnosis not present

## 2017-11-27 DIAGNOSIS — R1312 Dysphagia, oropharyngeal phase: Secondary | ICD-10-CM | POA: Diagnosis not present

## 2017-11-27 DIAGNOSIS — M6281 Muscle weakness (generalized): Secondary | ICD-10-CM | POA: Diagnosis not present

## 2017-11-27 DIAGNOSIS — I69320 Aphasia following cerebral infarction: Secondary | ICD-10-CM | POA: Diagnosis not present

## 2017-11-30 DIAGNOSIS — M6281 Muscle weakness (generalized): Secondary | ICD-10-CM | POA: Diagnosis not present

## 2017-11-30 DIAGNOSIS — R1312 Dysphagia, oropharyngeal phase: Secondary | ICD-10-CM | POA: Diagnosis not present

## 2017-11-30 DIAGNOSIS — I69320 Aphasia following cerebral infarction: Secondary | ICD-10-CM | POA: Diagnosis not present

## 2017-12-01 DIAGNOSIS — R1312 Dysphagia, oropharyngeal phase: Secondary | ICD-10-CM | POA: Diagnosis not present

## 2017-12-01 DIAGNOSIS — I69320 Aphasia following cerebral infarction: Secondary | ICD-10-CM | POA: Diagnosis not present

## 2017-12-01 DIAGNOSIS — M6281 Muscle weakness (generalized): Secondary | ICD-10-CM | POA: Diagnosis not present

## 2017-12-02 DIAGNOSIS — F064 Anxiety disorder due to known physiological condition: Secondary | ICD-10-CM | POA: Diagnosis not present

## 2017-12-02 DIAGNOSIS — F331 Major depressive disorder, recurrent, moderate: Secondary | ICD-10-CM | POA: Diagnosis not present

## 2017-12-02 DIAGNOSIS — I69320 Aphasia following cerebral infarction: Secondary | ICD-10-CM | POA: Diagnosis not present

## 2017-12-02 DIAGNOSIS — R1312 Dysphagia, oropharyngeal phase: Secondary | ICD-10-CM | POA: Diagnosis not present

## 2017-12-02 DIAGNOSIS — G301 Alzheimer's disease with late onset: Secondary | ICD-10-CM | POA: Diagnosis not present

## 2017-12-02 DIAGNOSIS — F0280 Dementia in other diseases classified elsewhere without behavioral disturbance: Secondary | ICD-10-CM | POA: Diagnosis not present

## 2017-12-02 DIAGNOSIS — M6281 Muscle weakness (generalized): Secondary | ICD-10-CM | POA: Diagnosis not present

## 2017-12-03 DIAGNOSIS — M6281 Muscle weakness (generalized): Secondary | ICD-10-CM | POA: Diagnosis not present

## 2017-12-03 DIAGNOSIS — F419 Anxiety disorder, unspecified: Secondary | ICD-10-CM | POA: Diagnosis not present

## 2017-12-03 DIAGNOSIS — F330 Major depressive disorder, recurrent, mild: Secondary | ICD-10-CM | POA: Diagnosis not present

## 2017-12-10 DIAGNOSIS — F419 Anxiety disorder, unspecified: Secondary | ICD-10-CM | POA: Diagnosis not present

## 2017-12-10 DIAGNOSIS — F330 Major depressive disorder, recurrent, mild: Secondary | ICD-10-CM | POA: Diagnosis not present

## 2017-12-16 DIAGNOSIS — L603 Nail dystrophy: Secondary | ICD-10-CM | POA: Diagnosis not present

## 2017-12-16 DIAGNOSIS — I739 Peripheral vascular disease, unspecified: Secondary | ICD-10-CM | POA: Diagnosis not present

## 2017-12-16 DIAGNOSIS — B351 Tinea unguium: Secondary | ICD-10-CM | POA: Diagnosis not present

## 2017-12-16 DIAGNOSIS — Q845 Enlarged and hypertrophic nails: Secondary | ICD-10-CM | POA: Diagnosis not present

## 2017-12-23 DIAGNOSIS — I4891 Unspecified atrial fibrillation: Secondary | ICD-10-CM | POA: Diagnosis not present

## 2017-12-23 DIAGNOSIS — M6281 Muscle weakness (generalized): Secondary | ICD-10-CM | POA: Diagnosis not present

## 2017-12-23 DIAGNOSIS — I10 Essential (primary) hypertension: Secondary | ICD-10-CM | POA: Diagnosis not present

## 2017-12-23 DIAGNOSIS — I639 Cerebral infarction, unspecified: Secondary | ICD-10-CM | POA: Diagnosis not present

## 2017-12-24 DIAGNOSIS — M6281 Muscle weakness (generalized): Secondary | ICD-10-CM | POA: Diagnosis not present

## 2017-12-24 DIAGNOSIS — I10 Essential (primary) hypertension: Secondary | ICD-10-CM | POA: Diagnosis not present

## 2017-12-24 DIAGNOSIS — E569 Vitamin deficiency, unspecified: Secondary | ICD-10-CM | POA: Diagnosis not present

## 2017-12-24 DIAGNOSIS — D649 Anemia, unspecified: Secondary | ICD-10-CM | POA: Diagnosis not present

## 2017-12-28 DIAGNOSIS — E785 Hyperlipidemia, unspecified: Secondary | ICD-10-CM | POA: Diagnosis not present

## 2017-12-28 DIAGNOSIS — D649 Anemia, unspecified: Secondary | ICD-10-CM | POA: Diagnosis not present

## 2017-12-28 DIAGNOSIS — Z79899 Other long term (current) drug therapy: Secondary | ICD-10-CM | POA: Diagnosis not present

## 2017-12-28 DIAGNOSIS — E039 Hypothyroidism, unspecified: Secondary | ICD-10-CM | POA: Diagnosis not present

## 2018-01-04 DIAGNOSIS — G894 Chronic pain syndrome: Secondary | ICD-10-CM | POA: Diagnosis not present

## 2018-01-07 DIAGNOSIS — M6281 Muscle weakness (generalized): Secondary | ICD-10-CM | POA: Diagnosis not present

## 2018-01-07 DIAGNOSIS — I10 Essential (primary) hypertension: Secondary | ICD-10-CM | POA: Diagnosis not present

## 2018-01-07 DIAGNOSIS — I639 Cerebral infarction, unspecified: Secondary | ICD-10-CM | POA: Diagnosis not present

## 2018-01-07 DIAGNOSIS — I4891 Unspecified atrial fibrillation: Secondary | ICD-10-CM | POA: Diagnosis not present

## 2018-01-14 ENCOUNTER — Encounter: Payer: Self-pay | Admitting: Cardiovascular Disease

## 2018-01-14 ENCOUNTER — Ambulatory Visit (INDEPENDENT_AMBULATORY_CARE_PROVIDER_SITE_OTHER): Payer: Medicare Other | Admitting: Cardiovascular Disease

## 2018-01-14 VITALS — BP 100/62 | HR 77 | Ht 66.0 in | Wt 108.0 lb

## 2018-01-14 DIAGNOSIS — I4811 Longstanding persistent atrial fibrillation: Secondary | ICD-10-CM

## 2018-01-14 DIAGNOSIS — I639 Cerebral infarction, unspecified: Secondary | ICD-10-CM | POA: Diagnosis not present

## 2018-01-14 DIAGNOSIS — I10 Essential (primary) hypertension: Secondary | ICD-10-CM

## 2018-01-14 DIAGNOSIS — R4701 Aphasia: Secondary | ICD-10-CM

## 2018-01-14 DIAGNOSIS — Z7901 Long term (current) use of anticoagulants: Secondary | ICD-10-CM

## 2018-01-14 DIAGNOSIS — E7800 Pure hypercholesterolemia, unspecified: Secondary | ICD-10-CM

## 2018-01-14 NOTE — Progress Notes (Signed)
Cardiology Consultation Note:    Date:  01/15/2018   ID:  Raymon Mutton, DOB 1924-07-28, MRN [id number]  PCP:  Merlene Laughter, MD  Cardiologist:  Thurmon Fair, MD  Electrophysiologist:  None   Referring MD: George Hugh, NP   Chief Complaint  Patient presents with  . Atrial Fibrillation    Recent stroke  Marisa Smith is a 84 y.o. female who is being seen today for the evaluation of atrial fibrillation  at the request of George Hugh, NP.   History of Present Illness:    AALIAYAH MIAO is a 84 y.o. female with a hx of embolic stroke in August 2019, to have atrial fibrillation at the time.  She has treated hypertension and hyperlipidemia, but does not have known peripheral disease or coronary disease.  She takes chronic treatment with mercaptopurine for autoimmune hepatitis.  She is a widow, is a nursing home resident and spends most of the day in a wheelchair or in bed.  She is able to transfer, but only walks very short distances.  She is accompanied by an aide from her nursing facility.  She is unaware of the arrhythmia.  She does not feel palpitations.  She denies problems with shortness of breath at rest or with light activity.  She has not experienced syncope.  She denies angina either at rest or with activity.  She has not had any new neurological events.  She denies leg edema or claudication.  She has not had recent falls, injuries or bleeding.  She did require repair of her humerus and distal radius which were fractured after a fall in 2015.  She has recovered most of her problems with aphasia  Echo performed in August 2019 shows left ventricular ejection fraction of 55-60% and a known dilated left atrium.  This study did show dilation of the right ventricle with moderately reduced right ventricular function.  Pulmonary artery pressure was not elevated.  MRI showed multiple foci of acute embolic strokes, largest in the left frontal lobe but also with a subacute right  cerebellar infarct and multiple old cerebellar infarcts.  Past Medical History:  Diagnosis Date  . ALLERGIC RHINITIS CAUSE UNSPECIFIED   . ANXIETY   . Autoimmune hepatitis (HCC)    on MP6, prev pred  . DEPRESSION, MAJOR, MODERATE   . Headache(784.0) 02/15/2010  . HYPERTENSION, BENIGN ESSENTIAL   . HYPOTHYROIDISM   . OSTEOPOROSIS    fx L prox hum and L distal radius, both requiring ORIF 02/2013  . Unspecified vitamin D deficiency     Past Surgical History:  Procedure Laterality Date  . CATARACT EXTRACTION  2008   Left eye  . CATARACT EXTRACTION  2009   Right eye  . HUMERUS IM NAIL Left 02/2013   DR Sol Blazing  . HUMERUS IM NAIL Left 02/24/2013   Procedure: INTRAMEDULLARY (IM) NAIL LEFT PROXIMAL  HUMERUS;  Surgeon: Mable Paris, MD;  Location: [facility] OR;  Service: Orthopedics;  Laterality: Left;  . IR KYPHO LUMBAR INC FX REDUCE BONE BX UNI/BIL CANNULATION INC/IMAGING  07/28/2017  . OPEN REDUCTION INTERNAL FIXATION (ORIF) DISTAL RADIAL FRACTURE Left 02/16/2013   Procedure: OPEN REDUCTION INTERNAL FIXATION (ORIF) DISTAL RADIAL FRACTURE;  Surgeon: Sharma Covert, MD;  Location: [facility] OR;  Service: Orthopedics;  Laterality: Left;  . ORIF RADIAL FRACTURE Right 03/01/2016   Procedure: OPEN REDUCTION INTERNAL FIXATION (ORIF) RADIUS FRACTURE;  Surgeon: Dominica Severin, MD;  Location: [facility] OR;  Service: Orthopedics;  Laterality: Right;  . TONSILLECTOMY  1945    Current Medications: Current Meds  Medication Sig  . acetaminophen (TYLENOL) 325 MG tablet Take 325 mg by mouth every 4 (four) hours as needed for mild pain (right hip pain).  [name] Kitchen. acetaminophen (TYLENOL) 500 MG tablet Take 1,000 mg by mouth every 6 (six) hours as needed for mild pain or headache.   [name] Kitchen. apixaban (ELIQUIS) 2.5 MG TABS tablet Take 1 tablet (2.5 mg total) by mouth 2 (two) times daily.  [name] Kitchen. atorvastatin (LIPITOR) 40 MG tablet Take 1 tablet (40 mg total) by mouth daily at 6 PM.  . busPIRone (BUSPAR) 15 MG tablet Take 15 mg by mouth  2 (two) times daily.   . Calcium Carbonate-Vitamin D 600-200 MG-UNIT CAPS Take 1 capsule by mouth daily.  . cetirizine (ZYRTEC) 10 MG tablet Take 10 mg by mouth daily.  . Cholecalciferol (VITAMIN D3) 1000 units CAPS Take 1,000 Units by mouth daily.   [name] Kitchen. diltiazem (CARDIZEM) 90 MG tablet Take 1 tablet (90 mg total) by mouth every 6 (six) hours.  . docusate sodium (COLACE) 100 MG capsule Take 100 mg by mouth daily.  . Eyelid Cleansers (OCUSOFT LID SCRUB) PADS Place 1 each into both eyes 2 (two) times daily.  . Glycerin-Hypromellose-PEG 400 0.2-0.36-1 % SOLN Place 1 drop into both eyes 3 (three) times daily.  [name] Kitchen. HYDROcodone-acetaminophen (NORCO) 5-325 MG tablet Take 1 tablet by mouth every 6 (six) hours as needed for moderate pain.  [name] Kitchen. lactulose (CHRONULAC) 10 GM/15ML solution Take 30 mLs by mouth 2 (two) times daily as needed.   [name] Kitchen. levothyroxine (SYNTHROID, LEVOTHROID) 50 MCG tablet Take 1 tablet (50 mcg total) by mouth daily before breakfast.  . loperamide (IMODIUM A-D) 2 MG tablet Take 2 mg by mouth as needed for diarrhea or loose stools.  [name] Kitchen. LORazepam (ATIVAN) 0.5 MG tablet Take 1 tablet (0.5 mg total) by mouth 2 (two) times daily.  . Menthol, Topical Analgesic, (BIOFREEZE ROLL-ON) 4 % GEL Apply 1 application topically 3 (three) times daily.  . mercaptopurine (PURINETHOL) 50 MG tablet Take 0.5 tablets (25 mg total) by mouth daily. Give on an empty stomach 1 hour before or 2 hours after meals. Caution: Chemotherapy.  . metoprolol tartrate (LOPRESSOR) 25 MG tablet Take 0.5 tablets (12.5 mg total) by mouth 2 (two) times daily.  . mirtazapine (REMERON) 45 MG tablet Take 45 mg by mouth at bedtime.   . ondansetron (ZOFRAN ODT) 8 MG disintegrating tablet Take 1 tablet (8 mg total) by mouth every 8 (eight) hours as needed for nausea or vomiting.  [name] Kitchen. Propylene Glycol-Glycerin (ARTIFICIAL TEARS) 1-0.3 % SOLN Place 1 drop into both eyes 3 (three) times daily.  [name] Kitchen. senna (SENOKOT) 8.6 MG TABS tablet Take 1 tablet by  mouth daily.     Allergies:   Amlodipine besylate; Azatadine; Azathioprine; Penicillins; Sertraline hcl; and Sulfonamide derivatives   Social History   Socioeconomic History  . Marital status: Widowed    Spouse name: Not on file  . Number of children: 1  . Years of education: Not on file  . Highest education level: Not on file  Occupational History    Employer: RETIRED  Social Needs  . Financial resource strain: Not on file  . Food insecurity:    Worry: Not on file    Inability: Not on file  . Transportation needs:    Medical: Not on file    Non-medical: Not on file  Tobacco Use  . Smoking status: Never Smoker  . Smokeless tobacco: Never Used  .  Tobacco comment: Married, Retired- daughter is Okey Regal Rowen-Fifield responsible for majority of supervison for parents  Substance and Sexual Activity  . Alcohol use: No    Alcohol/week: 0.0 standard drinks  . Drug use: No  . Sexual activity: Not Currently  Lifestyle  . Physical activity:    Days per week: Not on file    Minutes per session: Not on file  . Stress: Not on file  Relationships  . Social connections:    Talks on phone: Not on file    Gets together: Not on file    Attends religious service: Not on file    Active member of club or organization: Not on file    Attends meetings of clubs or organizations: Not on file    Relationship status: Not on file  Other Topics Concern  . Not on file  Social History Narrative  . Not on file     Family History: The patient's family history includes Heart disease in her father; Hypertension in her mother; Stroke in her mother and sister.  ROS:   Please see the history of present illness.     All other systems reviewed and are negative.  EKGs/Labs/Other Studies Reviewed:    The following studies were reviewed today: Notes from her hospitalization in August and from her recent neurology follow-up.  Echocardiogram.  EKG:  EKG is  ordered today.  The ekg ordered today  demonstrates atrial fibrillation with a single wide-complex beat that is probably a PVC, nonspecific intraventricular conduction delay (QRS 112 ms), no ischemic repolarization abnormalities, QTC 470 ms.  Recent Labs: 09/16/2017: ALT 10; Magnesium 2.0; TSH 5.627 09/21/2017: BUN 17; Creatinine, Ser 0.70; Hemoglobin 12.5; Platelets 212; Potassium 3.9; Sodium 137  Recent Lipid Panel    Component Value Date/Time   CHOL 155 09/17/2017 0218   TRIG 71 09/17/2017 0218   HDL 47 09/17/2017 0218   CHOLHDL 3.3 09/17/2017 0218   VLDL 14 09/17/2017 0218   LDLCALC 94 09/17/2017 0218    Physical Exam:    VS:  BP 100/62 (BP Location: Left Arm, Patient Position: Sitting, Cuff Size: Normal)   Pulse 77   Ht 5\' 6"  (1.676 m)   Wt 108 lb (49 kg)   BMI 17.43 kg/m     Wt Readings from Last 3 Encounters:  01/14/18 108 lb (49 kg)  09/16/17 111 lb 12.4 oz (50.7 kg)  07/28/17 121 lb 4.1 oz (55 kg)     GEN: Very thin and very frail-appearing, borderline cachectic, well developed in no acute distress HEENT: Normal NECK: No JVD; No carotid bruits LYMPHATICS: No lymphadenopathy CARDIAC: Irregular, no murmurs, rubs, gallops RESPIRATORY:  Clear to auscultation without rales, wheezing or rhonchi  ABDOMEN: Soft, non-tender, non-distended MUSCULOSKELETAL:  No edema; No deformity  SKIN: Warm and dry NEUROLOGIC:  Alert and oriented x 3; speech is halting and she sometimes struggles to find words suggestive some residual a aphasia, no residual facial droop, no other overt neurological deficits PSYCHIATRIC:  Normal affect   ASSESSMENT:    1. Longstanding persistent atrial fibrillation   2. Long term current use of anticoagulant   3. Essential hypertension   4. 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 Hypercholesterolemia    PLAN:    In order of problems listed above:  1. AFib: She has at least long-term persistent atrial fibrillation and more likely has permanent atrial fibrillation.   She is on diltiazem for rate control as well as a very low  dose of beta-blocker.  Rate control appears to be adequate.  There is no indication to perform cardioversion or to start antiarrhythmics. CHASVasc 6 (age 17, gender, HTN history of stroke 2). 2. Eliquis: She is appropriately anticoagulated with Eliquis and a dose adjusted for age and low body size.  She has not had bleeding complications. 3. HTN: Well rate controlled. 4. Aphasia following stroke: Her responses are slow and she sometimes struggles with words but is able to communicate reasonably well. 5. HLP: LDL at target.   Medication Adjustments/Labs and Tests Ordered: Current medicines are reviewed at length with the patient today.  Concerns regarding medicines are outlined above.  No orders of the defined types were placed in this encounter.  No orders of the defined types were placed in this encounter.   Patient Instructions  Medication Instructions:  Dr Royann Shivers recommends that you continue on your current medications as directed. Please refer to the Current Medication list given to you today.  If you need a refill on your cardiac medications before your next appointment, please call your pharmacy.   Follow-Up: At [facility], you and your health needs are our priority.  As part of our continuing mission to provide you with exceptional heart care, we have created designated Provider Care Teams.  These Care Teams include your primary Cardiologist (physician) and Advanced Practice Providers (APPs -  Physician Assistants and Nurse Practitioners) who all work together to provide you with the care you need, when you need it. You will need a follow up appointment in 12 months. You may see Thurmon Fair, MD or one of the following Advanced Practice Providers on your designated Care Team: [place], [place] . Micah Flesher, PA-C . You will receive a reminder letter in the mail two months in advance. If you don't receive a letter, please  call our office to schedule the follow-up appointment.    Signed, Thurmon Fair, MD  01/15/2018 1:31 PM    [facility]

## 2018-01-14 NOTE — Patient Instructions (Addendum)
Medication Instructions:  Dr Croitoru recommends that you continue on your current medications as directed. Please refer to the Current Medication list given to you today.  If you need a refill on your cardiac medications before your next appointment, please call your pharmacy.   Follow-Up: At [facility], you and your health needs are our priority.  As part of our continuing mission to provide you with exceptional heart care, we have created designated Provider Care Teams.  These Care Teams include your primary Cardiologist (physician) and Advanced Practice Providers (APPs -  Physician Assistants and Nurse Practitioners) who all work together to provide you with the care you need, when you need it. You will need a follow up appointment in 12 months. You may see Mihai Croitoru, MD or one of the following Advanced Practice Providers on your designated Care Team: Hao Meng, PA-C . Yvonnia Tango Duke, PA-C . You will receive a reminder letter in the mail two months in advance. If you don't receive a letter, please call our office to schedule the follow-up appointment. 

## 2018-01-15 ENCOUNTER — Encounter: Payer: Self-pay | Admitting: Cardiovascular Disease

## 2018-01-20 DIAGNOSIS — F0280 Dementia in other diseases classified elsewhere without behavioral disturbance: Secondary | ICD-10-CM | POA: Diagnosis not present

## 2018-01-20 DIAGNOSIS — G301 Alzheimer's disease with late onset: Secondary | ICD-10-CM | POA: Diagnosis not present

## 2018-01-20 DIAGNOSIS — F064 Anxiety disorder due to known physiological condition: Secondary | ICD-10-CM | POA: Diagnosis not present

## 2018-01-20 DIAGNOSIS — F331 Major depressive disorder, recurrent, moderate: Secondary | ICD-10-CM | POA: Diagnosis not present

## 2018-02-04 DIAGNOSIS — I69320 Aphasia following cerebral infarction: Secondary | ICD-10-CM | POA: Diagnosis not present

## 2018-02-08 DIAGNOSIS — I69320 Aphasia following cerebral infarction: Secondary | ICD-10-CM | POA: Diagnosis not present

## 2018-02-16 ENCOUNTER — Encounter: Payer: Self-pay | Admitting: Adult Health

## 2018-02-16 ENCOUNTER — Ambulatory Visit (INDEPENDENT_AMBULATORY_CARE_PROVIDER_SITE_OTHER): Payer: Medicare Other | Admitting: Adult Health

## 2018-02-16 VITALS — BP 135/82 | HR 56 | Ht 66.0 in | Wt 112.2 lb

## 2018-02-16 DIAGNOSIS — I480 Paroxysmal atrial fibrillation: Secondary | ICD-10-CM

## 2018-02-16 DIAGNOSIS — I10 Essential (primary) hypertension: Secondary | ICD-10-CM | POA: Diagnosis not present

## 2018-02-16 DIAGNOSIS — E785 Hyperlipidemia, unspecified: Secondary | ICD-10-CM

## 2018-02-16 DIAGNOSIS — I63412 Cerebral infarction due to embolism of left middle cerebral artery: Secondary | ICD-10-CM | POA: Diagnosis not present

## 2018-02-16 NOTE — Progress Notes (Signed)
[facility] [address] [place]. [place] [address] [phone]       OFFICE FOLLOW UP NOTE  Marisa Smith Date of Birth:  [birth year] Medical Record Number:  [id number]   Reason for Referral:  hospital stroke follow up  CHIEF COMPLAINT:  Chief Complaint  Patient presents with  . Follow-up    3 month follow up. Caregiver present. Rm 9.     HPI:  Interval history 02/16/2018: Patient is being seen today for 3-month follow-up visit and is accompanied by her caregiver.  She continues to reside at [facility].  She overall has been doing well with mild residual dysarthria. Currently in w/c but is able to ambulate with rolling walker. Continues on Eliquis for atrial fibrillation and secondary stroke prevention without side effects of bleeding or bruising.  Continues on atorvastatin without side effects myalgias.  Blood pressure today 135/82.  No further concerns at this time.  Denies new or worsening stroke/TIA symptoms.  10/27/2017 visit: Patient is being seen today for hospital follow-up and is accompanied by SNF transport aide.  She continues to experience dysarthria along with generalized weakness but she does feel as though this has been improving.  She denies participating in therapy since discharge and per facility information that was sent over the patient, no evidence of prior therapy sessions.  She has continued to take Eliquis for atrial fibrillation along with aspirin 81 mg but denies bleeding or bruising.  Continues to take Lipitor without side effects of myalgias.  Blood pressure today satisfactory 108/70.  She is currently sitting in a wheelchair as she has a difficult time ambulating.  Denies new or worsening stroke/TIA symptoms.  Hospital admission 09/16/2017:  Marisa Smith is being seen today for initial visit in the office for right cerebellum and left MCA scattered infarcts on 09/16/17. History obtained from patient and chart review. Reviewed all  radiology images and labs personally.  Marisa Smith is a 85 year old female with history of anxiety, depression, autoimmune hepatitis, hypertension, hypothyroidism, atrial fibrillation not on anticoagulation, nursing home resident and recent L1 compression fracture status post kyphoplasty who was admitted for right facial droop and aphasia. CT showed right cerebellum infarct. CTA head and neck unremarkable except left M3 atherosclerosis. MRI showed right cerebellum and left MCA scattered infarcts. 2D echo showed anEF of 55 to 60%. LDL 94 and A1c 6.0. In ER, EKG showed A. fib RVR. Patient was previously on aspirin 81mg  and recommended to start eliquis 2.5mg  BID for management and secondary stroke prevention. LDL 94 and recommended lipitor 40mg . Therapies recommended discharge to SNF for continued therapies.      ROS:   14 system review of systems performed and negative with exception of speech difficulty and weakness  PMH:  Past Medical History:  Diagnosis Date  . ALLERGIC RHINITIS CAUSE UNSPECIFIED   . ANXIETY   . Autoimmune hepatitis (HCC)    on MP6, prev pred  . DEPRESSION, MAJOR, MODERATE   . Headache(784.0) 02/15/2010  . HYPERTENSION, BENIGN ESSENTIAL   . HYPOTHYROIDISM   . OSTEOPOROSIS    fx L prox hum and L distal radius, both requiring ORIF 02/2013  . Unspecified vitamin D deficiency     PSH:  Past Surgical History:  Procedure Laterality Date  . CATARACT EXTRACTION  2008   Left eye  . CATARACT EXTRACTION  2009   Right eye  . HUMERUS IM NAIL Left 02/2013   DR Sol Blazing  . HUMERUS IM NAIL Left 02/24/2013  Procedure: INTRAMEDULLARY (IM) NAIL LEFT PROXIMAL  HUMERUS;  Surgeon: Mable ParisJustin William Chandler, MD;  Location: [facility] OR;  Service: Orthopedics;  Laterality: Left;  . IR KYPHO LUMBAR INC FX REDUCE BONE BX UNI/BIL CANNULATION INC/IMAGING  07/28/2017  . OPEN REDUCTION INTERNAL FIXATION (ORIF) DISTAL RADIAL FRACTURE Left 02/16/2013   Procedure: OPEN REDUCTION INTERNAL FIXATION  (ORIF) DISTAL RADIAL FRACTURE;  Surgeon: Sharma CovertFred W Ortmann, MD;  Location: [facility] OR;  Service: Orthopedics;  Laterality: Left;  . ORIF RADIAL FRACTURE Right 03/01/2016   Procedure: OPEN REDUCTION INTERNAL FIXATION (ORIF) RADIUS FRACTURE;  Surgeon: Dominica SeverinWilliam Gramig, MD;  Location: [facility] OR;  Service: Orthopedics;  Laterality: Right;  . TONSILLECTOMY  1945    Social History:  Social History   Socioeconomic History  . Marital status: Widowed    Spouse name: Not on file  . Number of children: 1  . Years of education: Not on file  . Highest education level: Not on file  Occupational History    Employer: RETIRED  Social Needs  . Financial resource strain: Not on file  . Food insecurity:    Worry: Not on file    Inability: Not on file  . Transportation needs:    Medical: Not on file    Non-medical: Not on file  Tobacco Use  . Smoking status: Never Smoker  . Smokeless tobacco: Never Used  . Tobacco comment: Married, Retired- daughter is [name] Affinito-Fifield responsible for majority of supervison for parents  Substance and Sexual Activity  . Alcohol use: No    Alcohol/week: 0.0 standard drinks  . Drug use: No  . Sexual activity: Not Currently  Lifestyle  . Physical activity:    Days per week: Not on file    Minutes per session: Not on file  . Stress: Not on file  Relationships  . Social connections:    Talks on phone: Not on file    Gets together: Not on file    Attends religious service: Not on file    Active member of club or organization: Not on file    Attends meetings of clubs or organizations: Not on file    Relationship status: Not on file  . Intimate partner violence:    Fear of current or ex partner: Not on file    Emotionally abused: Not on file    Physically abused: Not on file    Forced sexual activity: Not on file  Other Topics Concern  . Not on file  Social History Narrative  . Not on file    Family History:  Family History  Problem Relation Age of Onset  .  Hypertension Mother   . Stroke Mother   . Heart disease Father   . Stroke Sister     Medications:   Current Outpatient Medications on File Prior to Visit  Medication Sig Dispense Refill  . acetaminophen (TYLENOL) 500 MG tablet Take 1,000 mg by mouth every 6 (six) hours as needed for mild pain or headache.     [name] Kitchen. apixaban (ELIQUIS) 2.5 MG TABS tablet Take 2.5 mg by mouth 2 (two) times daily.    [name] Kitchen. atorvastatin (LIPITOR) 40 MG tablet Take 40 mg by mouth daily.    . bisacodyl (DULCOLAX) 10 MG suppository Place 10 mg rectally as needed for moderate constipation.    . busPIRone (BUSPAR) 15 MG tablet Take 15 mg by mouth 2 (two) times daily.   10  . Calcium Carbonate-Vitamin D 600-200 MG-UNIT CAPS Take 1 capsule by mouth daily.    .[name] Kitchen  cetirizine (ZYRTEC) 10 MG tablet Take 10 mg by mouth daily.    . Cholecalciferol (VITAMIN D3) 1000 units CAPS Take 1,000 Units by mouth daily.     [name] Kitchen diltiazem (CARDIZEM) 90 MG tablet Take 90 mg by mouth every 6 (six) hours.    . docusate sodium (COLACE) 100 MG capsule Take 100 mg by mouth daily.    [name] Kitchen ENSURE (ENSURE) Take 1 Can by mouth 3 (three) times daily.    . Eyelid Cleansers (OCUSOFT LID SCRUB) PADS Place 1 each into both eyes 2 (two) times daily.    [name] Kitchen HYDROcodone-acetaminophen (NORCO) 5-325 MG tablet Take 1 tablet by mouth every 6 (six) hours as needed for moderate pain. 2 tablet 0  . lactulose (CHRONULAC) 10 GM/15ML solution Take 30 mLs by mouth 2 (two) times daily as needed.   0  . levothyroxine (SYNTHROID, LEVOTHROID) 50 MCG tablet Take 1 tablet (50 mcg total) by mouth daily before breakfast. 30 tablet 5  . loperamide (IMODIUM A-D) 2 MG tablet Take 2 mg by mouth as needed for diarrhea or loose stools.    [name] Kitchen LORazepam (ATIVAN) 0.5 MG tablet Take 1 tablet (0.5 mg total) by mouth 2 (two) times daily. 2 tablet 4  . mercaptopurine (PURINETHOL) 50 MG tablet Take 0.5 tablets (25 mg total) by mouth daily. Give on an empty stomach 1 hour before or 2 hours after meals.  Caution: Chemotherapy. 30 tablet 11  . metoprolol tartrate (LOPRESSOR) 25 MG tablet Take 12.5 mg by mouth 2 (two) times daily.    . mirtazapine (REMERON) 45 MG tablet Take 45 mg by mouth at bedtime.   10  . ondansetron (ZOFRAN ODT) 8 MG disintegrating tablet Take 1 tablet (8 mg total) by mouth every 8 (eight) hours as needed for nausea or vomiting. 10 tablet 0  . senna (SENOKOT) 8.6 MG TABS tablet Take 1 tablet by mouth daily.    [name] Kitchen UNABLE TO FIND Take by mouth 2 (two) times daily. Med Name: Medpass 120 cc    . acetaminophen (TYLENOL) 325 MG tablet Take 325 mg by mouth every 4 (four) hours as needed for mild pain (right hip pain).    . Glycerin-Hypromellose-PEG 400 0.2-0.36-1 % SOLN Place 1 drop into both eyes 3 (three) times daily.    . Menthol, Topical Analgesic, (BIOFREEZE ROLL-ON) 4 % GEL Apply 1 application topically 3 (three) times daily.    [name] Kitchen Propylene Glycol-Glycerin (ARTIFICIAL TEARS) 1-0.3 % SOLN Place 1 drop into both eyes 3 (three) times daily.     No current facility-administered medications on file prior to visit.     Allergies:   Allergies  Allergen Reactions  . Amlodipine Besylate     REACTION: itching  . Azatadine     Per MAR?  [name] Kitchen Azathioprine     REACTION: nausea  . Penicillins     REACTION: yeast infection Per MAR  . Sertraline Hcl     REACTION: rash  . Sulfonamide Derivatives     REACTION: swelling and itching     Physical Exam  Vitals:   02/16/18 1352  BP: 135/82  Pulse: (!) 56  Weight: 112 lb 3.2 oz (50.9 kg)  Height: 5\' 6"  (1.676 m)   Body mass index is 18.11 kg/m. No exam data present  General: frail elderly pleasant caucasian female, seated, in no evident distress Head: head normocephalic and atraumatic.   Neck: supple with no carotid or supraclavicular bruits Cardiovascular: regular rate and rhythm, no murmurs Musculoskeletal: no deformity Skin:  no rash/petichiae Vascular:  Normal pulses all extremities  Neurologic Exam Mental Status:  Awake and fully alert. Mild dysarthria present.Oriented to place and time. Recent and remote memory intact. Attention span, concentration and fund of knowledge appropriate. Flat affect.  Cranial Nerves: Pupils equal, briskly reactive to light. Extraocular movements full without nystagmus. Visual fields full to confrontation. Hearing intact. Facial sensation intact. Face, tongue, palate moves normally and symmetrically.  Motor: Normal bulk and tone.  Generalized bilateral lower extremity weakness in hip flexors Sensory.: intact to touch , pinprick , position and vibratory sensation.  Coordination: Rapid alternating movements normal in all extremities. Finger-to-nose and heel-to-shin performed accurately bilaterally. Gait and Station: Patient currently sitting in a wheelchair but does endorse ambulation with rolling walker which is not present in appointment.  Gait assessment deferred today Reflexes: 1+ and symmetric. Toes downgoing.      Diagnostic Data (Labs, Imaging, Testing)  Ct Head Code Stroke Wo Contrast 09/16/2017 1. Small age-indeterminate infarct in the left frontal cortex. ASPECTS is 9. 2. Small age-indeterminate right superior cerebellar infarct. 3. Chronic small vessel ischemia.   Ct Angio Head W Or Wo Contrast Ct Angio Neck W Or Wo Contrast 09/16/2017 1. No large vessel occlusion. 2. Overall mild for age atherosclerosis without flow limiting stenosis in the major vessels. 3. Focal atherosclerotic irregularity of a left M3 branch of note given the preceding head CT findings. 4. There are layering pleural effusions. Small focus of airspace disease in the right upper lobe that is indistinct and likely inflammatory.   Mr Brain Wo Contrast 09/16/2017 1. Multiple foci of acute ischemia within the left hemisphere with the largest lesions located in the left frontal lobe. No hemorrhage or mass effect.  2. Punctate subacute right cerebellar infarct suspected on the basis of DWI  hyperintensity without corresponding decrease of apparent diffusion coefficient.  3. Multiple old cerebellar infarcts and advanced chronic ischemic microangiopathy.      ASSESSMENT: Marisa Smith is a 85 y.o. year old female here with left MCA punctate and right cerebellar infarcts on 09/16/2017 secondary to known atrial fibrillation not on anticoagulation. Vascular risk factors include AF not on AC, HTN, and HLD.  Patient is being seen today for follow-up visit and overall continues to be stable from stroke standpoint with residual deficit of mild dysarthria.    PLAN: -Continue Eliquis (apixaban) daily  and Lipitor 40 mg for secondary stroke prevention -F/u with PCP regarding your HLD and HTN management -Referral placed to follow-up with cardiology for atrial fibrillation and Eliquis management -continue to monitor BP at home -advised to continue to stay active as tolerated and maintain a healthy diet -Maintain strict control of hypertension with blood pressure goal below 130/90, diabetes with hemoglobin A1c goal below 6.5% and cholesterol with LDL cholesterol (bad cholesterol) goal below 70 mg/dL. I also advised the patient to eat a healthy diet with plenty of whole grains, cereals, fruits and vegetables, exercise regularly and maintain ideal body weight.  Follow up in 6 months or call earlier if needed   Greater than 50% of time during this 25 minute visit was spent on counseling,explanation of diagnosis of left MCA punctate and right cerebellar infarcts, reviewing risk factor management of AF, HTN and HLD, planning of further management, discussion with patient and family and coordination of care    Marisa Smith, [facility] HospitalGNP-BC  [facility] [address] [place], [place] [id number]  Phone [phone] Fax [phone] Note: This document was prepared with digital dictation and  possible smart Lobbyist. Any transcriptional errors that result  from this process are unintentional.

## 2018-02-16 NOTE — Patient Instructions (Signed)
Continue aspirin 81 mg daily and Eliquis (apixaban) daily  and lipitor  for secondary stroke prevention  Continue to follow up with PCP regarding cholesterol and blood pressure management   Continue to monitor blood pressure at home  Maintain strict control of hypertension with blood pressure goal below 130/90, diabetes with hemoglobin A1c goal below 6.5% and cholesterol with LDL cholesterol (bad cholesterol) goal below 70 mg/dL. I also advised the patient to eat a healthy diet with plenty of whole grains, cereals, fruits and vegetables, exercise regularly and maintain ideal body weight.  Followup in the future with me in 6 months or call earlier if needed       Thank you for coming to see [place] at [facility]. I hope we have been able to provide you high quality care today.  You may receive a patient satisfaction survey over the next few weeks. We would appreciate your feedback and comments so that we may continue to improve ourselves and the health of our patients.

## 2018-02-17 DIAGNOSIS — F331 Major depressive disorder, recurrent, moderate: Secondary | ICD-10-CM | POA: Diagnosis not present

## 2018-02-17 DIAGNOSIS — L603 Nail dystrophy: Secondary | ICD-10-CM | POA: Diagnosis not present

## 2018-02-17 DIAGNOSIS — F0280 Dementia in other diseases classified elsewhere without behavioral disturbance: Secondary | ICD-10-CM | POA: Diagnosis not present

## 2018-02-17 DIAGNOSIS — F064 Anxiety disorder due to known physiological condition: Secondary | ICD-10-CM | POA: Diagnosis not present

## 2018-02-17 DIAGNOSIS — I739 Peripheral vascular disease, unspecified: Secondary | ICD-10-CM | POA: Diagnosis not present

## 2018-02-17 DIAGNOSIS — Q845 Enlarged and hypertrophic nails: Secondary | ICD-10-CM | POA: Diagnosis not present

## 2018-02-17 DIAGNOSIS — G301 Alzheimer's disease with late onset: Secondary | ICD-10-CM | POA: Diagnosis not present

## 2018-02-18 NOTE — Progress Notes (Signed)
I agree with the above plan 

## 2018-02-19 DIAGNOSIS — B379 Candidiasis, unspecified: Secondary | ICD-10-CM | POA: Diagnosis not present

## 2018-02-19 DIAGNOSIS — L22 Diaper dermatitis: Secondary | ICD-10-CM | POA: Diagnosis not present

## 2018-03-04 DIAGNOSIS — K5900 Constipation, unspecified: Secondary | ICD-10-CM | POA: Diagnosis not present

## 2018-03-04 DIAGNOSIS — I639 Cerebral infarction, unspecified: Secondary | ICD-10-CM | POA: Diagnosis not present

## 2018-03-04 DIAGNOSIS — I4891 Unspecified atrial fibrillation: Secondary | ICD-10-CM | POA: Diagnosis not present

## 2018-03-04 DIAGNOSIS — R54 Age-related physical debility: Secondary | ICD-10-CM | POA: Diagnosis not present

## 2018-03-06 DIAGNOSIS — G894 Chronic pain syndrome: Secondary | ICD-10-CM | POA: Diagnosis not present

## 2018-03-06 DIAGNOSIS — R110 Nausea: Secondary | ICD-10-CM | POA: Diagnosis not present

## 2018-03-24 DIAGNOSIS — F0280 Dementia in other diseases classified elsewhere without behavioral disturbance: Secondary | ICD-10-CM | POA: Diagnosis not present

## 2018-03-24 DIAGNOSIS — G301 Alzheimer's disease with late onset: Secondary | ICD-10-CM | POA: Diagnosis not present

## 2018-03-24 DIAGNOSIS — F064 Anxiety disorder due to known physiological condition: Secondary | ICD-10-CM | POA: Diagnosis not present

## 2018-03-24 DIAGNOSIS — F331 Major depressive disorder, recurrent, moderate: Secondary | ICD-10-CM | POA: Diagnosis not present

## 2018-03-31 DIAGNOSIS — K5900 Constipation, unspecified: Secondary | ICD-10-CM | POA: Diagnosis not present

## 2018-04-07 DIAGNOSIS — F331 Major depressive disorder, recurrent, moderate: Secondary | ICD-10-CM | POA: Diagnosis not present

## 2018-04-07 DIAGNOSIS — F064 Anxiety disorder due to known physiological condition: Secondary | ICD-10-CM | POA: Diagnosis not present

## 2018-04-07 DIAGNOSIS — F0280 Dementia in other diseases classified elsewhere without behavioral disturbance: Secondary | ICD-10-CM | POA: Diagnosis not present

## 2018-04-07 DIAGNOSIS — G301 Alzheimer's disease with late onset: Secondary | ICD-10-CM | POA: Diagnosis not present

## 2018-04-30 DIAGNOSIS — G894 Chronic pain syndrome: Secondary | ICD-10-CM | POA: Diagnosis not present

## 2018-04-30 DIAGNOSIS — R110 Nausea: Secondary | ICD-10-CM | POA: Diagnosis not present

## 2018-05-12 DIAGNOSIS — F331 Major depressive disorder, recurrent, moderate: Secondary | ICD-10-CM | POA: Diagnosis not present

## 2018-05-12 DIAGNOSIS — F064 Anxiety disorder due to known physiological condition: Secondary | ICD-10-CM | POA: Diagnosis not present

## 2018-05-12 DIAGNOSIS — F0280 Dementia in other diseases classified elsewhere without behavioral disturbance: Secondary | ICD-10-CM | POA: Diagnosis not present

## 2018-05-12 DIAGNOSIS — G301 Alzheimer's disease with late onset: Secondary | ICD-10-CM | POA: Diagnosis not present

## 2018-05-13 DIAGNOSIS — R54 Age-related physical debility: Secondary | ICD-10-CM | POA: Diagnosis not present

## 2018-05-13 DIAGNOSIS — K5900 Constipation, unspecified: Secondary | ICD-10-CM | POA: Diagnosis not present

## 2018-05-13 DIAGNOSIS — I4891 Unspecified atrial fibrillation: Secondary | ICD-10-CM | POA: Diagnosis not present

## 2018-05-13 DIAGNOSIS — I639 Cerebral infarction, unspecified: Secondary | ICD-10-CM | POA: Diagnosis not present

## 2018-05-19 DIAGNOSIS — E039 Hypothyroidism, unspecified: Secondary | ICD-10-CM | POA: Diagnosis not present

## 2018-05-19 DIAGNOSIS — E569 Vitamin deficiency, unspecified: Secondary | ICD-10-CM | POA: Diagnosis not present

## 2018-05-19 DIAGNOSIS — I10 Essential (primary) hypertension: Secondary | ICD-10-CM | POA: Diagnosis not present

## 2018-05-19 DIAGNOSIS — D649 Anemia, unspecified: Secondary | ICD-10-CM | POA: Diagnosis not present

## 2018-05-26 DIAGNOSIS — G301 Alzheimer's disease with late onset: Secondary | ICD-10-CM | POA: Diagnosis not present

## 2018-05-26 DIAGNOSIS — F0280 Dementia in other diseases classified elsewhere without behavioral disturbance: Secondary | ICD-10-CM | POA: Diagnosis not present

## 2018-05-26 DIAGNOSIS — F331 Major depressive disorder, recurrent, moderate: Secondary | ICD-10-CM | POA: Diagnosis not present

## 2018-05-26 DIAGNOSIS — F064 Anxiety disorder due to known physiological condition: Secondary | ICD-10-CM | POA: Diagnosis not present

## 2018-06-18 DIAGNOSIS — B370 Candidal stomatitis: Secondary | ICD-10-CM | POA: Diagnosis not present

## 2018-07-07 DIAGNOSIS — F0280 Dementia in other diseases classified elsewhere without behavioral disturbance: Secondary | ICD-10-CM | POA: Diagnosis not present

## 2018-07-07 DIAGNOSIS — F064 Anxiety disorder due to known physiological condition: Secondary | ICD-10-CM | POA: Diagnosis not present

## 2018-07-07 DIAGNOSIS — G301 Alzheimer's disease with late onset: Secondary | ICD-10-CM | POA: Diagnosis not present

## 2018-07-07 DIAGNOSIS — F331 Major depressive disorder, recurrent, moderate: Secondary | ICD-10-CM | POA: Diagnosis not present

## 2018-08-18 ENCOUNTER — Ambulatory Visit: Payer: Medicare Other | Admitting: Adult Health

## 2019-03-24 IMAGING — MR MR HEAD W/O CM
9 of 10 series · 34 of 48 positions shown · non-contrast
Comparison: Head CT 09/16/2017

CLINICAL DATA: Right facial droop and aphasia.

EXAM:
MRI HEAD WITHOUT CONTRAST
TECHNIQUE: Multiplanar, multiecho pulse sequences of the brain and surrounding
structures were obtained without intravenous contrast.

[Series 3: DWI · axial · 3.0mm · 0.94mm/px · z∈[-92,+52]mm · 8 of 100 slices shown (1 of 2)]
[im 1/100]
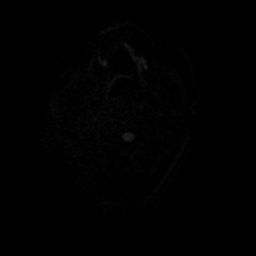
[im 12/100]
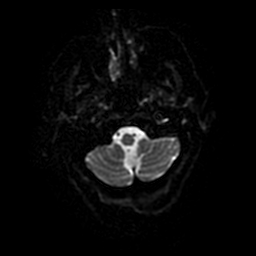
[im 34/100]
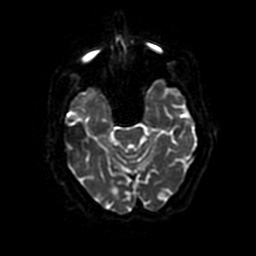
[im 45/100]
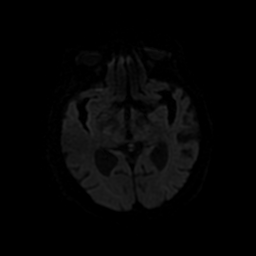
[im 56/100]
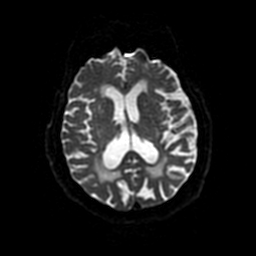
[im 67/100]
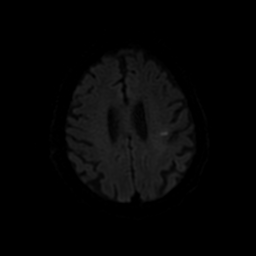
[im 89/100]
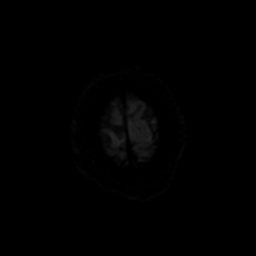
[im 100/100]
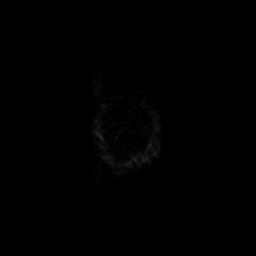

[Series 4: DWI · coronal · 4.0mm · 0.94mm/px · 6 of 68 slices shown (2 of 2)]
[im 1/68]
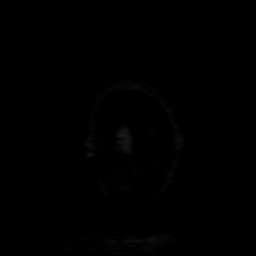
[im 14/68]
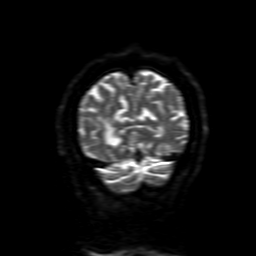
[im 27/68]
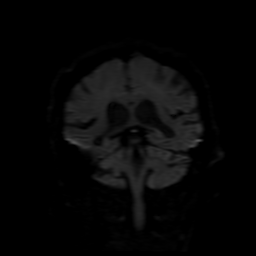
[im 41/68]
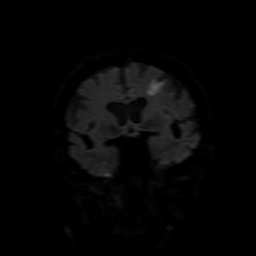
[im 54/68]
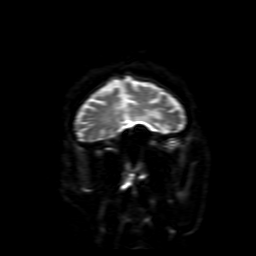
[im 68/68]
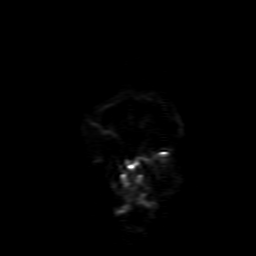

[Series 5: FLAIR · sagittal · 5.0mm · 0.47mm/px · 2 of 24 slices shown (1 of 2)]
[im 1/24]
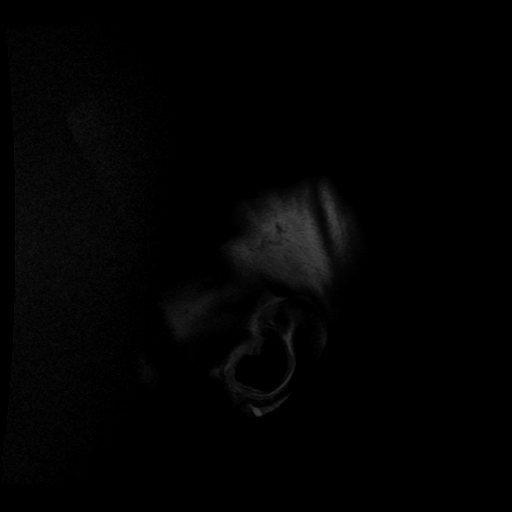
[im 24/24]
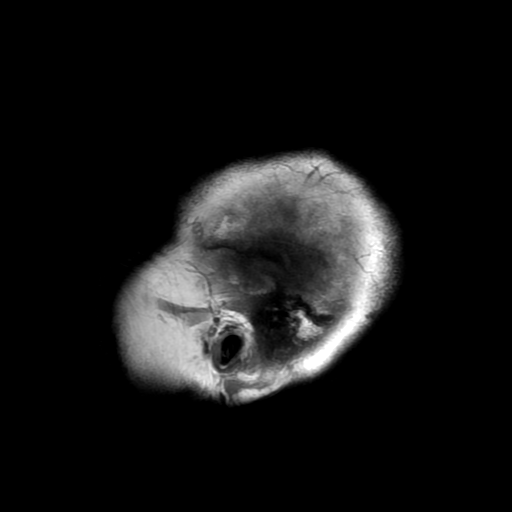

[Series 7: T2 · axial · 5.0mm · 0.45mm/px · z∈[-75,+64]mm · 2 of 25 slices shown (1 of 2)]
[im 1/25]
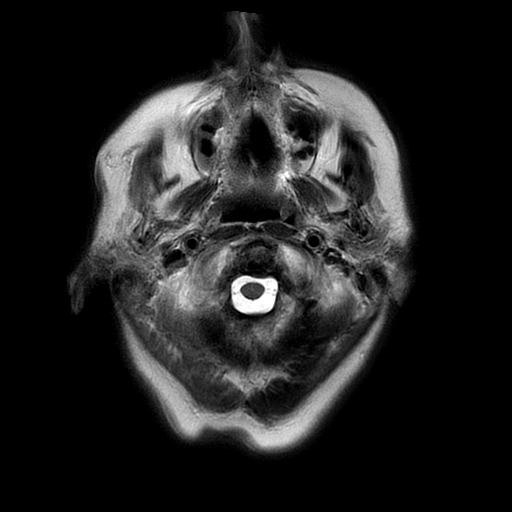
[im 25/25]
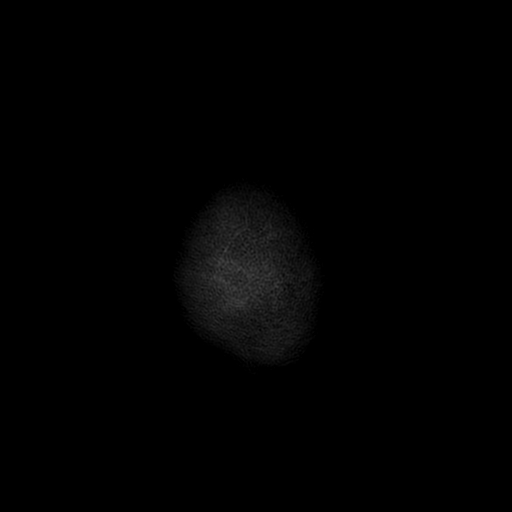

[Series 8: FLAIR · axial · 3.0mm · 0.45mm/px · z∈[-75,+64]mm · 2 of 25 slices shown (2 of 2)]
[im 1/25]
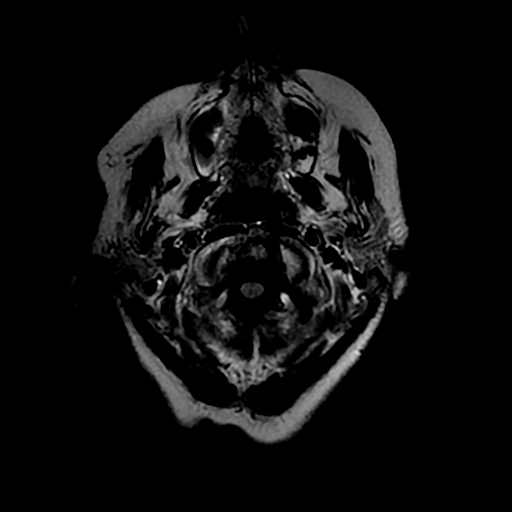
[im 25/25]
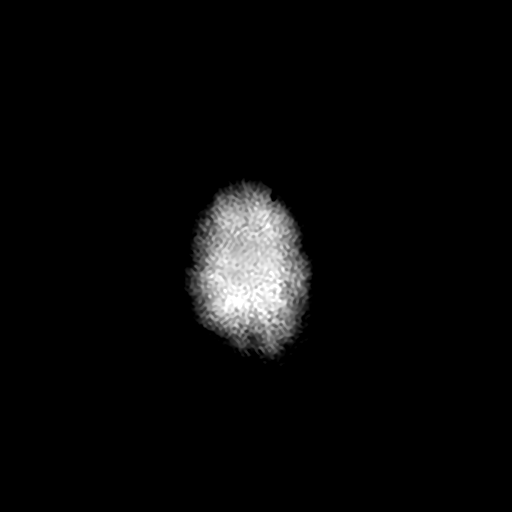

[Series 9: (person_name) · axial · 3.0mm · 0.47mm/px · z∈[-85,-36]mm · 3 of 100 slices shown]
[im 1/100]
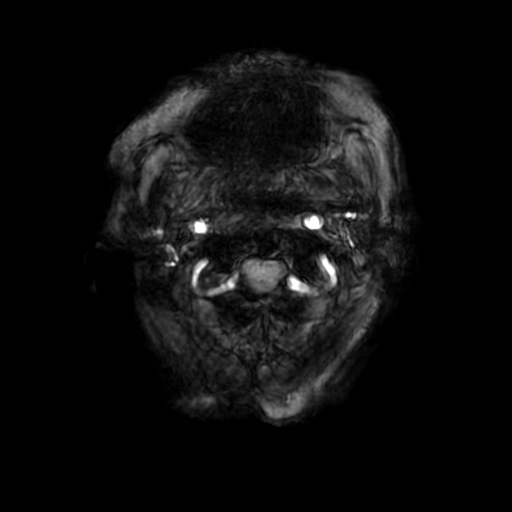
[im 12/100]
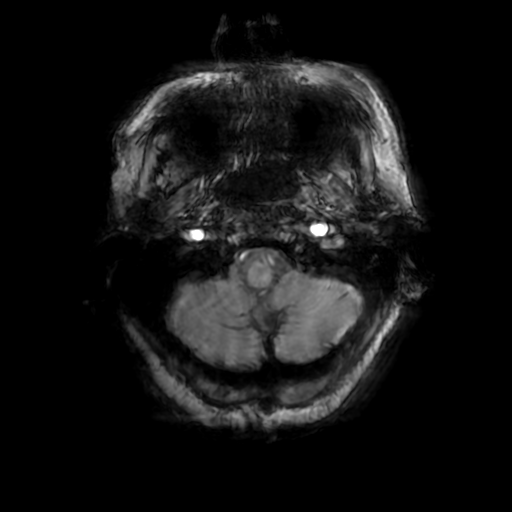
[im 34/100]
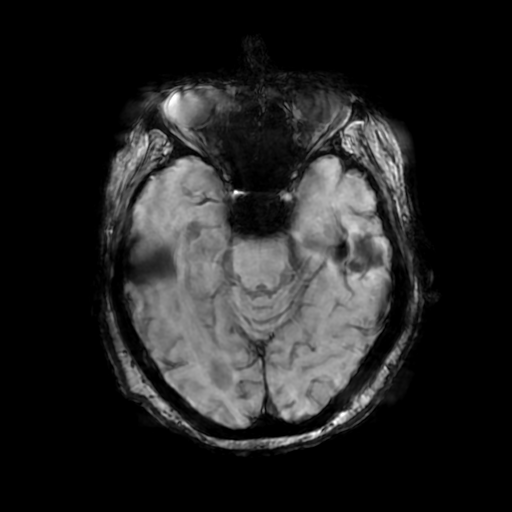

[Series 11: T2 · coronal · 5.0mm · 0.39mm/px · 3 of 27 slices shown (2 of 2)]
[im 1/27]
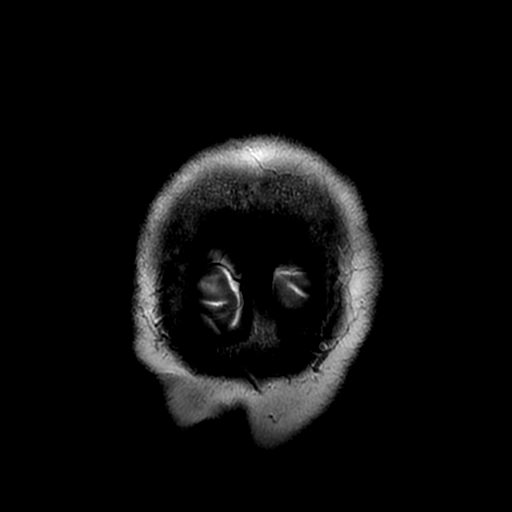
[im 14/27]
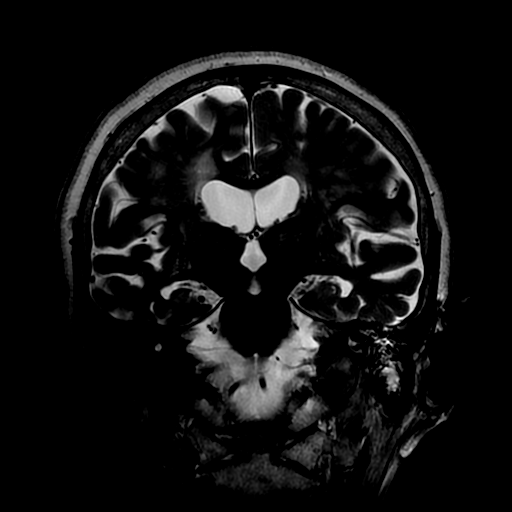
[im 27/27]
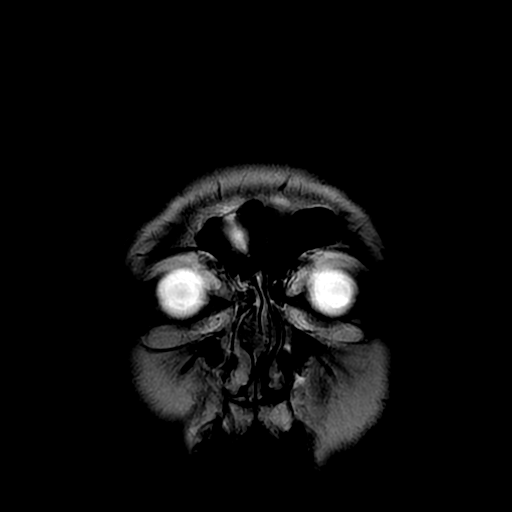

[Series 350: ADC · axial · 3.0mm · 0.94mm/px · z∈[-92,+52]mm · 5 of 50 slices shown (1 of 2)]
[im 1/50]
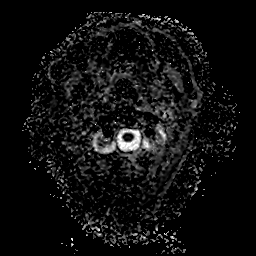
[im 13/50]
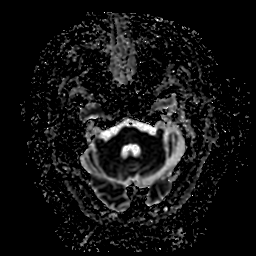
[im 25/50]
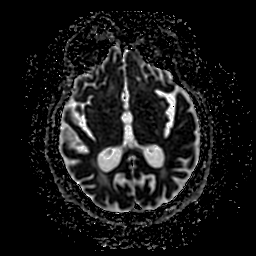
[im 37/50]
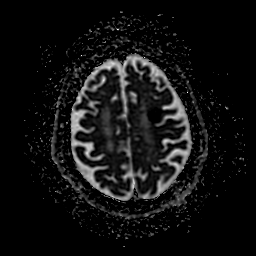
[im 50/50]
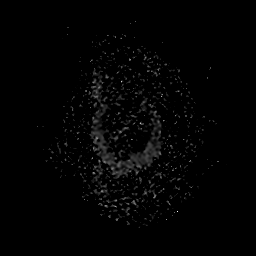

[Series 450: ADC · coronal · 4.0mm · 0.94mm/px · 3 of 34 slices shown (2 of 2)]
[im 1/34]
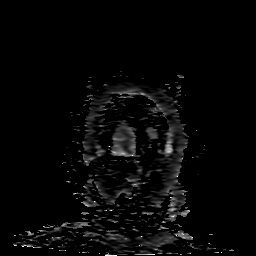
[im 17/34]
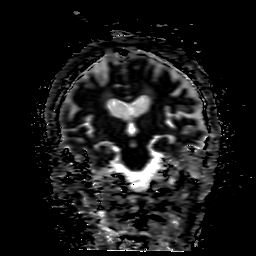
[im 34/34]
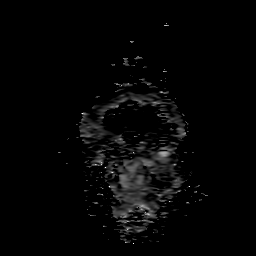

[34 of 48 positions shown; findings below may reference images not displayed]

FINDINGS: BRAIN: Multifocal acute ischemia within the left hemisphere,
including the left frontal white matter, base of the left precentral
gyrus and posterior left insula. There is an additional focus in the
posterior left temporal lobe. There is also a focus of
hyperintensity on diffusion-weighted imaging in the right cerebellar
hemisphere without corresponding abnormality on the ADC map. The
midline structures are normal. There are old bilateral cerebellar
infarcts. Diffuse confluent hyperintense T2-weighted signal within
the periventricular, deep and juxtacortical white matter, most
commonly due to chronic ischemic microangiopathy. Generalized
atrophy without lobar predilection. Susceptibility-sensitive
sequences show no chronic microhemorrhage or superficial siderosis.

VASCULAR: Major intracranial arterial and venous sinus flow voids
are preserved.

SKULL AND UPPER CERVICAL SPINE: The visualized skull base,
calvarium, upper cervical spine and extracranial soft tissues are
normal.

SINUSES/ORBITS: Left mastoid effusion. Paranasal sinuses are clear.
There are bilateral lens replacements.
IMPRESSION: 1. Multiple foci of acute ischemia within the left hemisphere with
the largest lesions located in the left frontal lobe. No hemorrhage
or mass effect.
2. Punctate subacute right cerebellar infarct suspected on the basis
of DWI hyperintensity without corresponding decrease of apparent
diffusion coefficient.
3. Multiple old cerebellar infarcts and advanced chronic ischemic
microangiopathy.

## 2019-03-24 IMAGING — DX DG CHEST 1V PORT
1 series · 1 of 1 positions shown · non-contrast
Comparison: Prior CT from 11/30/2016

CLINICAL DATA: Initial evaluation for acute shortness of breath.

EXAM:
PORTABLE CHEST 1 VIEW

[chest ap]
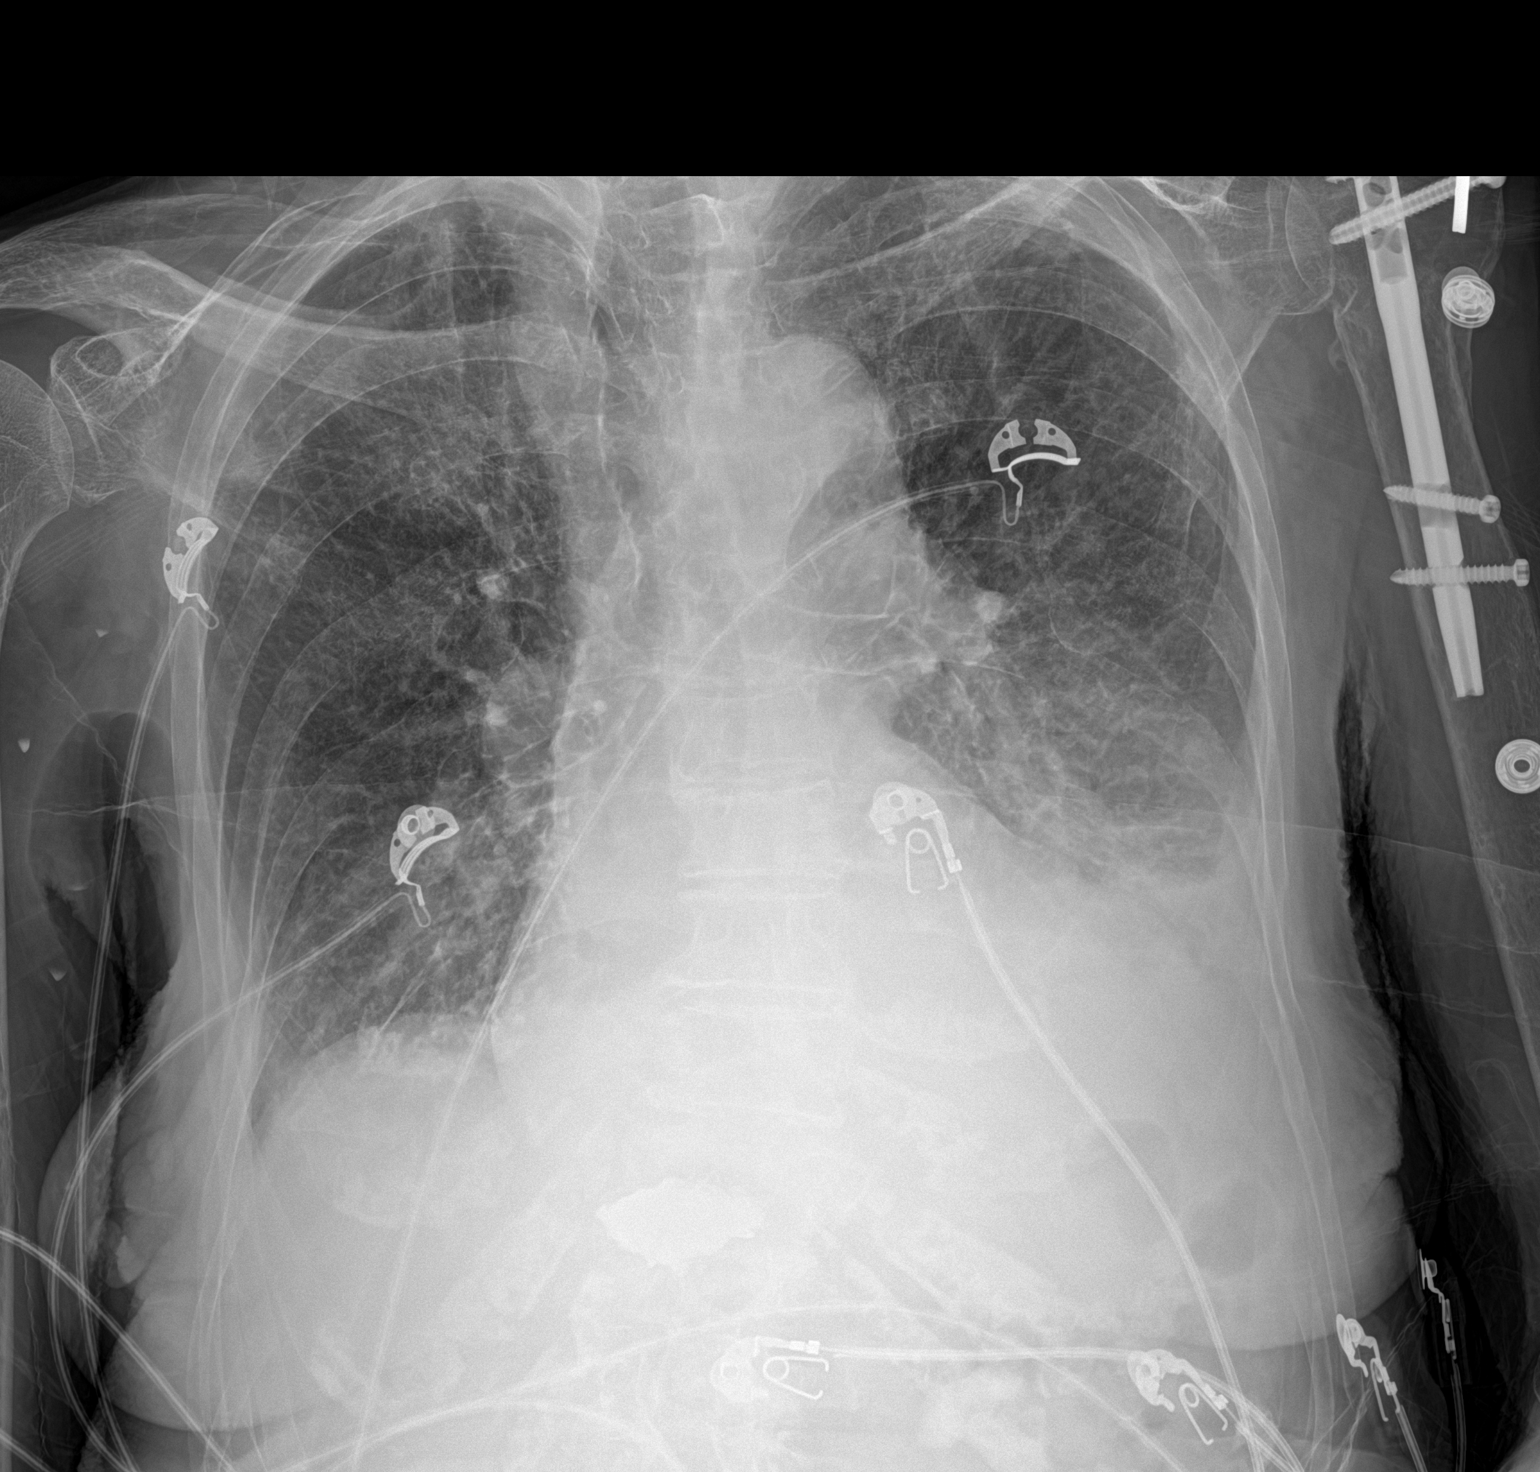

[1 of 1 positions shown; findings below may reference images not displayed]

FINDINGS: Mild cardiomegaly, stable. Mediastinal silhouette within normal
limits. Tortuosity the intrathoracic aorta noted.

Lungs mildly hypoinflated. Moderate layering left pleural effusion.
Associated left basilar opacity may reflect atelectasis or
infiltrate. Perihilar vascular congestion without overt pulmonary
edema. No pneumothorax.

No acute osseus abnormality. Diffuse osteopenia. Prior ORIF at the
proximal left humerus.
IMPRESSION: 1. Moderate layering left pleural effusion. Associated left basilar
opacity likely atelectasis, although infiltrate could be considered
in the correct clinical setting.
2. Cardiomegaly with mild diffuse pulmonary vascular congestion.

## 2019-03-28 IMAGING — DX DG CHEST 1V PORT
1 series · 1 of 1 positions shown · non-contrast
Comparison: 09/16/2017

CLINICAL DATA: Aspiration pneumonia

EXAM:
PORTABLE CHEST 1 VIEW

[chest ap]
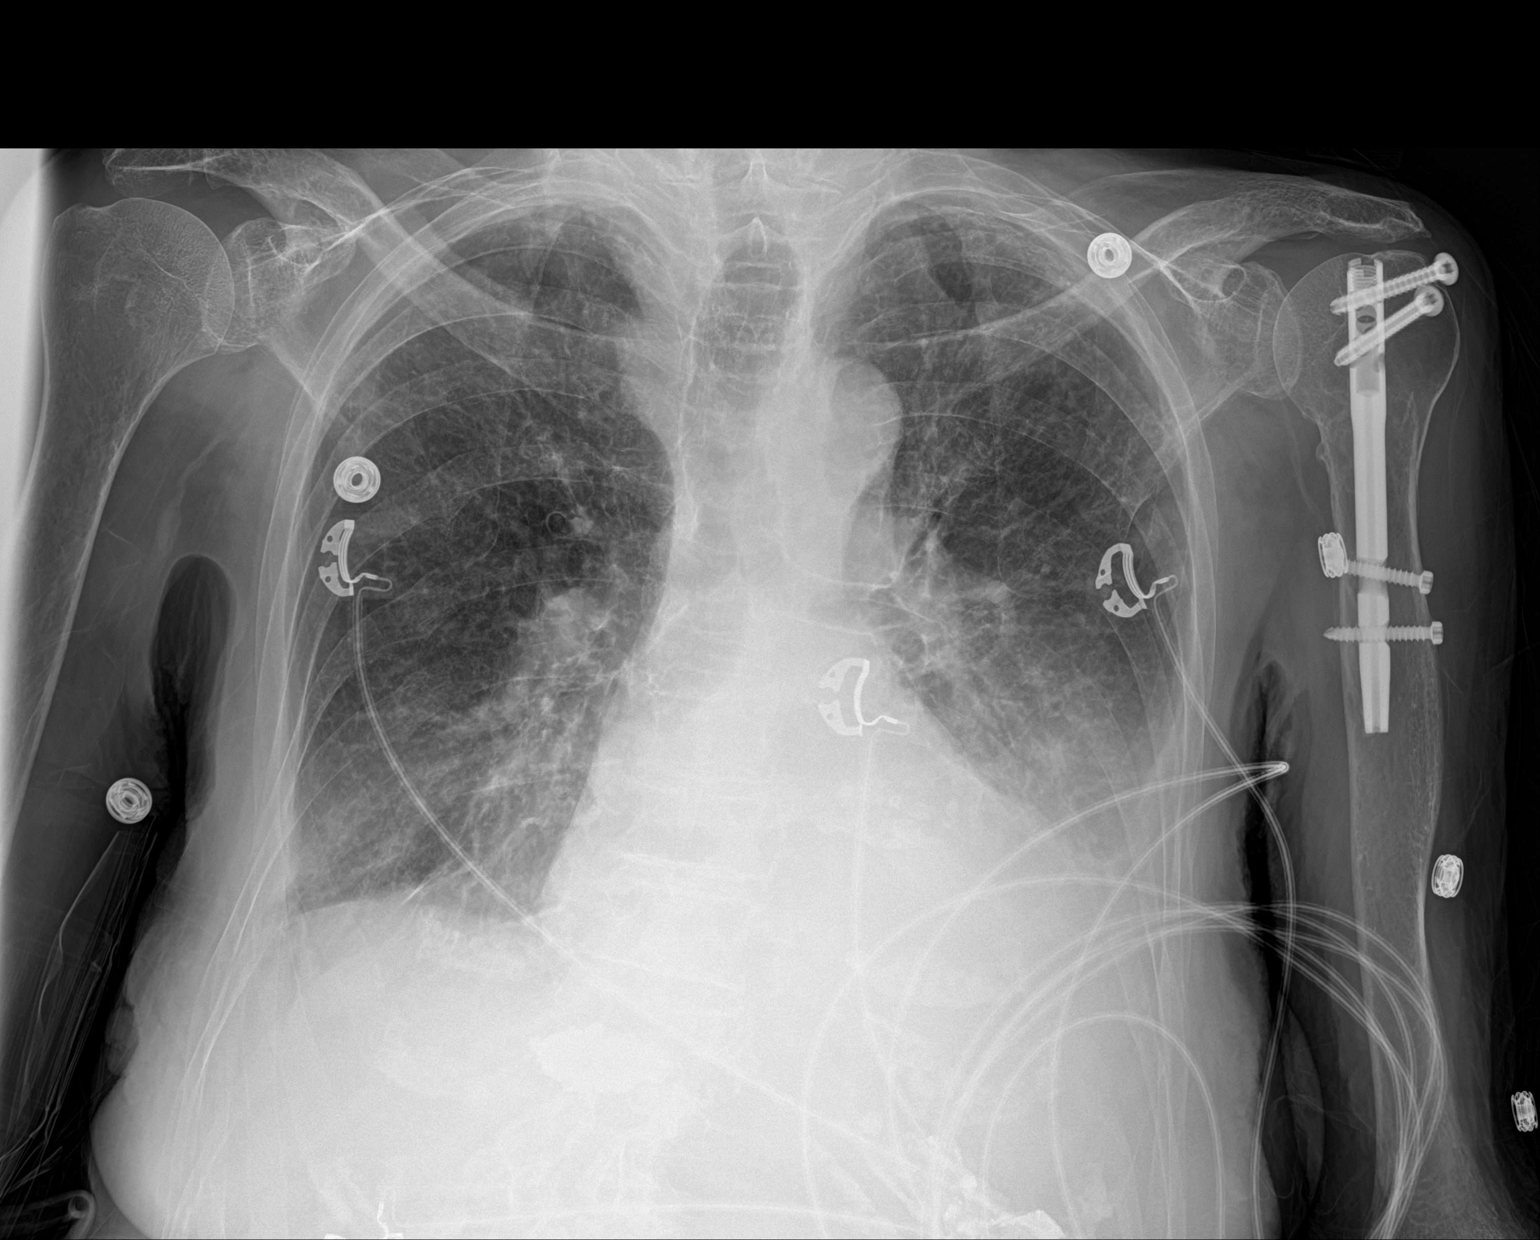

[1 of 1 positions shown; findings below may reference images not displayed]

FINDINGS: Cardiomediastinal silhouette is unchanged.

Small bilateral pleural effusions, LEFT-greater-than-RIGHT, LEFT
LOWER lung consolidation/atelectasis and mild RIGHT basilar airspace
disease/atelectasis are unchanged.

There is no evidence of pneumothorax.

No other changes are noted.
IMPRESSION: Unchanged appearance of the chest with small bilateral pleural
effusions, LEFT-greater-than-RIGHT, LEFT LOWER lung
consolidation/atelectasis and mild RIGHT basilar airspace
disease/atelectasis..

## 2019-04-04 DEATH — deceased
